# Patient Record
Sex: Male | Born: 1965 | Race: Black or African American | Hispanic: No | Marital: Single | Smoking: Former smoker
Health system: Southern US, Community
[De-identification: ages and names within clinical notes are randomized; demographics above are authoritative.]

## PROBLEM LIST (undated history)

## (undated) DIAGNOSIS — I5042 Chronic combined systolic (congestive) and diastolic (congestive) heart failure: Secondary | ICD-10-CM

## (undated) DIAGNOSIS — I2510 Atherosclerotic heart disease of native coronary artery without angina pectoris: Secondary | ICD-10-CM

## (undated) DIAGNOSIS — I10 Essential (primary) hypertension: Secondary | ICD-10-CM

## (undated) DIAGNOSIS — I4891 Unspecified atrial fibrillation: Secondary | ICD-10-CM

## (undated) DIAGNOSIS — N183 Chronic kidney disease, stage 3 unspecified: Secondary | ICD-10-CM

## (undated) DIAGNOSIS — I255 Ischemic cardiomyopathy: Secondary | ICD-10-CM

## (undated) DIAGNOSIS — I219 Acute myocardial infarction, unspecified: Secondary | ICD-10-CM

## (undated) DIAGNOSIS — Z720 Tobacco use: Secondary | ICD-10-CM

## (undated) DIAGNOSIS — R0600 Dyspnea, unspecified: Secondary | ICD-10-CM

## (undated) HISTORY — DX: Chronic kidney disease, stage 3 (moderate): N18.3

## (undated) HISTORY — DX: Tobacco use: Z72.0

## (undated) HISTORY — DX: Atherosclerotic heart disease of native coronary artery without angina pectoris: I25.10

## (undated) HISTORY — DX: Chronic kidney disease, stage 3 unspecified: N18.30

## (undated) HISTORY — DX: Chronic combined systolic (congestive) and diastolic (congestive) heart failure: I50.42

## (undated) HISTORY — DX: Ischemic cardiomyopathy: I25.5

## (undated) HISTORY — PX: OTHER SURGICAL HISTORY: SHX169

## (undated) HISTORY — PX: CARDIAC CATHETERIZATION: SHX172

---

## 2012-02-04 ENCOUNTER — Ambulatory Visit (INDEPENDENT_AMBULATORY_CARE_PROVIDER_SITE_OTHER): Payer: 59 | Admitting: Emergency Medicine

## 2012-02-04 VITALS — BP 158/103 | HR 98 | Temp 102.2°F | Resp 17 | Ht 69.0 in | Wt 212.0 lb

## 2012-02-04 DIAGNOSIS — R05 Cough: Secondary | ICD-10-CM

## 2012-02-04 DIAGNOSIS — J111 Influenza due to unidentified influenza virus with other respiratory manifestations: Secondary | ICD-10-CM

## 2012-02-04 MED ORDER — HYDROCOD POLST-CHLORPHEN POLST 10-8 MG/5ML PO LQCR: 5.0000 mL | Freq: Two times a day (BID) | ORAL | Status: DC | PRN

## 2012-02-04 MED ORDER — OSELTAMIVIR PHOSPHATE 75 MG PO CAPS: 75.0000 mg | ORAL_CAPSULE | Freq: Two times a day (BID) | ORAL | Status: DC

## 2012-02-04 NOTE — Patient Instructions (Signed)

## 2012-02-04 NOTE — Progress Notes (Signed)
[facility] [address] [place] [address] [phone]  Date:  02/04/2012   Name:  Seth Keller   DOB:  [birth year]   MRN:  [id number]  PCP:  No primary provider on file.    Chief Complaint: Fever, Cough and Abdominal Pain   History of Present Illness:  Seth Keller is a 46 y.o. very pleasant male patient who presents with the following:  Ill since Friday with cough productive scant purulent sputum.  Has nasal congestion and minimal mucoid discharge.  Has a fever, chills, myalgias. Had a temp up to 102.  Cough with paroxysms resulting in vomiting.  No wheezing but some exertional shortness of breath due coughing.  No improvement with OTC medications.  There is no problem list on file for this patient.   History reviewed. No pertinent past medical history.  History reviewed. No pertinent past surgical history.  History  Substance Use Topics  . Smoking status: Current Every Day Smoker -- 1.0 packs/day for 20 years    Types: Cigarettes  . Smokeless tobacco: Not on file  . Alcohol Use: No    History reviewed. No pertinent family history.  No Known Allergies  Medication list has been reviewed and updated.  No current outpatient prescriptions on file prior to visit.    Review of Systems:  As per HPI, otherwise negative.    Physical Examination: Filed Vitals:   02/04/12 1151  BP: 158/103  Pulse: 98  Temp: 102.2 F (39 C)  Resp: 17   Filed Vitals:   02/04/12 1151  Height: 5\' 9"  (1.753 m)  Weight: 212 lb (96.163 kg)   Body mass index is 31.31 kg/(m^2). Ideal Body Weight: Weight in (lb) to have BMI = 25: 168.9   GEN: WDWN, moderate distress`, Non-toxic, A & O x 3 HEENT: Atraumatic, Normocephalic. Neck supple. No masses, No LAD.  Oropharynx negative Ears and Nose: No external deformity.  TM negative CV: RRR, No M/G/R. No JVD. No thrill. No extra heart sounds. PULM: CTA B, no wheezes, crackles, rhonchi. No retractions. No resp. distress. No  accessory muscle use. ABD: S, NT, ND, +BS. No rebound. No HSM. EXTR: No c/c/e NEURO Normal gait.  PSYCH: Normally interactive. Conversant. Not depressed or anxious appearing.  Calm demeanor.    Assessment and Plan: Influenza tussionex tamiflu   Roselee Culver, MD  Results for orders placed in visit on 02/04/12  POCT INFLUENZA A/B      Component Value Range   Influenza A, POC Positive     Influenza B, POC Negative

## 2012-02-05 NOTE — Progress Notes (Signed)
Reviewed and agree.

## 2012-05-08 ENCOUNTER — Other Ambulatory Visit: Payer: Self-pay | Admitting: Family Medicine

## 2012-05-08 ENCOUNTER — Ambulatory Visit (INDEPENDENT_AMBULATORY_CARE_PROVIDER_SITE_OTHER): Payer: BC Managed Care – PPO | Admitting: Family Medicine

## 2012-05-08 ENCOUNTER — Encounter: Payer: Self-pay | Admitting: Family Medicine

## 2012-05-08 VITALS — BP 164/108 | HR 69 | Temp 98.1°F | Resp 18 | Ht 69.0 in | Wt 209.2 lb

## 2012-05-08 DIAGNOSIS — R3129 Other microscopic hematuria: Secondary | ICD-10-CM

## 2012-05-08 DIAGNOSIS — R809 Proteinuria, unspecified: Secondary | ICD-10-CM

## 2012-05-08 DIAGNOSIS — I10 Essential (primary) hypertension: Secondary | ICD-10-CM

## 2012-05-08 LAB — BASIC METABOLIC PANEL
BUN: 13 mg/dL (ref 6–23)
Calcium: 9.2 mg/dL (ref 8.4–10.5)
Creat: 1.21 mg/dL (ref 0.50–1.35)
Glucose, Bld: 83 mg/dL (ref 70–99)
Sodium: 141 mEq/L (ref 135–145)

## 2012-05-08 MED ORDER — LISINOPRIL-HYDROCHLOROTHIAZIDE 10-12.5 MG PO TABS: 1.0000 | ORAL_TABLET | Freq: Every day | ORAL | Status: DC

## 2012-05-08 NOTE — Patient Instructions (Signed)
Start taking one lisinopril/ hctz pill a day.  I will be in touch with your labs and to make a follow- up plan.

## 2012-05-08 NOTE — Progress Notes (Addendum)
[facility] [address], [place] [address] [phone]  Date:  05/08/2012   Name:  Seth Keller   DOB:  Dec 13, 1965   MRN:  [id number]  PCP:  No primary provider on file.    Chief Complaint: Hypertension   History of Present Illness:  Seth Keller is a 47 y.o. very pleasant male patient who presents with the following:  Here today to be treated for HTN noted at his [facility] evaluation 2 days ago.  He has never been treated for HTN or diagnosed with HTN in the past.  He has been seen here once before- in January, at which time he had influenza and his BP was 158/103.  He has been seen here 2 days ago and today for a WC issue and been noted to have HTN with BP up to 210/120, and microhematuria on both occassions as well.  He also has proteinuria.  He does not noted any symptoms of his HTN  There is no problem list on file for this patient.   No past medical history on file.  No past surgical history on file.  History  Substance Use Topics  . Smoking status: Current Every Day Smoker -- 1.00 packs/day for 20 years    Types: Cigarettes  . Smokeless tobacco: Not on file  . Alcohol Use: No    No family history on file.  No Known Allergies  Medication list has been reviewed and updated.  Current Outpatient Prescriptions on File Prior to Visit  Medication Sig Dispense Refill  . chlorpheniramine-HYDROcodone (TUSSIONEX PENNKINETIC ER) 10-8 MG/5ML LQCR Take 5 mLs by mouth every 12 (twelve) hours as needed (cough).  60 mL  0  . oseltamivir (TAMIFLU) 75 MG capsule Take 1 capsule (75 mg total) by mouth 2 (two) times daily.  10 capsule  0   No current facility-administered medications on file prior to visit.    Review of Systems:  As per HPI- otherwise negative.   Physical Examination: Filed Vitals:   05/08/12 1206  BP: 164/108  Pulse: 69  Temp: 98.1 F (36.7 C)  Resp: 18   Filed Vitals:   05/08/12 1206  Height: 5\' 9"  (1.753 m)  Weight: 209 lb 3.2 oz (94.892  kg)   Body mass index is 30.88 kg/(m^2). Ideal Body Weight: Weight in (lb) to have BMI = 25: 168.9  GEN: WDWN, NAD, Non-toxic, A & O x 3, mild overweight HEENT: Atraumatic, Normocephalic. Neck supple. No masses, No LAD. Ears and Nose: No external deformity. CV: RRR, No M/G/R. No JVD. No thrill. No extra heart sounds. PULM: CTA B, no wheezes, crackles, rhonchi. No retractions. No resp. distress. No accessory muscle use. ABD: S, NT, ND, +BS. No rebound. No HSM.  He has a current back injury per [facility] which is unrelated EXTR: No c/c/e NEURO Normal gait.  PSYCH: Normally interactive. Conversant. Not depressed or anxious appearing.  Calm demeanor.    Assessment and Plan: Unspecified essential hypertension - Plan: Basic metabolic panel, lisinopril-hydrochlorothiazide (PRINZIDE,ZESTORETIC) 10-12.5 MG per tablet, CANCELED: Comprehensive metabolic panel  Microhematuria - Plan: Ambulatory referral to Urology  Referral to urology to evaluate his microhematuria, especially as he is a smoker.  Untreated HTN- plan to start lisinopril/ HCTZ.  However, he is also noted to have significant proteinuria today.  Will await his CMP which should be back by tomorrow prior to having him start the BP medication to ensure we do not need to choose another agent if he has  elevated BUN/ creat.  His proteinuria may be due to microhematuria, but I doubt he has enough blood to cause this level of proteinuria.  Will plan to follow- up proteinuria as well  Signed Lamar Blinks, MD  Called 05/09/12-  Received his BMP- looks ok Results for orders placed in visit on [id number]  BASIC METABOLIC PANEL      Result Value Range   Sodium 141  135 - 145 mEq/L   Potassium 4.5  3.5 - 5.3 mEq/L   Chloride 105  96 - 112 mEq/L   CO2 27  19 - 32 mEq/L   Glucose, Bld 83  70 - 99 mg/dL   BUN 13  6 - 23 mg/dL   Creat 1.21  0.50 - 1.35 mg/dL   Calcium 9.2  8.4 - 10.5 mg/dL   He may go ahead and start the lisinopril/ HCTZ.  Will send  a copy of his labs and see him for follow- up of his WC case next week- at that time will recheck his BP.  An ACE should help to treat his proteinuria.

## 2012-05-09 ENCOUNTER — Encounter: Payer: Self-pay | Admitting: Family Medicine

## 2012-05-29 ENCOUNTER — Ambulatory Visit (HOSPITAL_COMMUNITY): Admission: RE | Admit: 2012-05-29 | Payer: BC Managed Care – PPO | Source: Ambulatory Visit

## 2012-05-29 ENCOUNTER — Telehealth: Payer: Self-pay | Admitting: Radiology

## 2012-05-29 ENCOUNTER — Ambulatory Visit (INDEPENDENT_AMBULATORY_CARE_PROVIDER_SITE_OTHER): Payer: BC Managed Care – PPO | Admitting: Family Medicine

## 2012-05-29 ENCOUNTER — Encounter: Payer: Self-pay | Admitting: Family Medicine

## 2012-05-29 VITALS — BP 180/110 | HR 63 | Temp 98.2°F | Resp 16 | Ht 69.0 in | Wt 221.0 lb

## 2012-05-29 DIAGNOSIS — R202 Paresthesia of skin: Secondary | ICD-10-CM

## 2012-05-29 DIAGNOSIS — R209 Unspecified disturbances of skin sensation: Secondary | ICD-10-CM

## 2012-05-29 DIAGNOSIS — R319 Hematuria, unspecified: Secondary | ICD-10-CM

## 2012-05-29 DIAGNOSIS — E785 Hyperlipidemia, unspecified: Secondary | ICD-10-CM

## 2012-05-29 DIAGNOSIS — I10 Essential (primary) hypertension: Secondary | ICD-10-CM

## 2012-05-29 DIAGNOSIS — R809 Proteinuria, unspecified: Secondary | ICD-10-CM

## 2012-05-29 LAB — POCT UA - MICROSCOPIC ONLY: Yeast, UA: NEGATIVE

## 2012-05-29 LAB — POCT URINALYSIS DIPSTICK
Bilirubin, UA: NEGATIVE
Glucose, UA: NEGATIVE
Leukocytes, UA: NEGATIVE
Nitrite, UA: NEGATIVE

## 2012-05-29 LAB — POCT CBC
HCT, POC: 47.2 % (ref 43.5–53.7)
Lymph, poc: 2.5 (ref 0.6–3.4)
MCHC: 32.4 g/dL (ref 31.8–35.4)
MCV: 85 fL (ref 80–97)
POC LYMPH PERCENT: 29.5 %L (ref 10–50)
RDW, POC: 14.4 %

## 2012-05-29 LAB — CREATININE WITH EST GFR: GFR, Est Non African American: 71 mL/min

## 2012-05-29 LAB — COMPREHENSIVE METABOLIC PANEL
AST: 15 U/L (ref 0–37)
Albumin: 4.1 g/dL (ref 3.5–5.2)
BUN: 18 mg/dL (ref 6–23)
Calcium: 9.5 mg/dL (ref 8.4–10.5)
Chloride: 103 mEq/L (ref 96–112)
Glucose, Bld: 83 mg/dL (ref 70–99)
Potassium: 3.9 mEq/L (ref 3.5–5.3)
Sodium: 138 mEq/L (ref 135–145)
Total Protein: 7.2 g/dL (ref 6.0–8.3)

## 2012-05-29 MED ORDER — AMLODIPINE BESYLATE 10 MG PO TABS: 10.0000 mg | ORAL_TABLET | Freq: Every day | ORAL | Status: DC

## 2012-05-29 NOTE — Telephone Encounter (Signed)
Patient states he can not afford the MRA scan (his part $200) so he left without the study. Patient advised if he gets worse to go to ER, he agrees. He would like referral to see a specialist instead.

## 2012-05-29 NOTE — Progress Notes (Addendum)
[facility] [address], [place] [address] [phone]  Date:  05/29/2012   Name:  Seth Keller   DOB:  February 23, 1965   MRN:  [id number]  PCP:  No primary provider on file.    Chief Complaint: Hypertension   History of Present Illness:  Seth Keller is a 47 y.o. very pleasant male patient who presents with the following:  Here to recheck his BP. We have been following his BP for about 3 weeks now. He has started on lisinopril/ hctz and is now up to taking lisinopril/ HCTZ 10/ 12.5, 2 tablets a day.  He is also taking flexeril for an unrelated WC injury.  He has been taking the increased dose of his BP medication for a couple of weeks and doing well except he does urinate more.  He does not check his BP at home  He had 2 episodes of numbness in his left leg- once a week ago and once yesterday. Each episode lasted about 15 mintues.  He attributed this to his back injury.  However, along with the leg numbness yesterday he also noted numbness and ?weakness in his left arm which lasted for approximelty 2 minutes. He did not have any slurred speech.   No headache.   No syncope No chest pain.    I had send him a letter last month regarding his normal BUN/ creatinine.  He took this to mean he did not need to see urology and did not attend his appt.  Explained that I still want him to see urology due to smoking history and microhematuria.  Called [facility] and got a new appt as below  Patient Active Problem List  Diagnosis  . Unspecified essential hypertension  . Microhematuria  . Proteinuria    No past medical history on file.  No past surgical history on file.  History  Substance Use Topics  . Smoking status: Current Every Day Smoker -- 1.00 packs/day for 20 years    Types: Cigarettes  . Smokeless tobacco: Not on file  . Alcohol Use: No    No family history on file.  No Known Allergies  Medication list has been reviewed and updated.  Current Outpatient  Prescriptions on File Prior to Visit  Medication Sig Dispense Refill  . lisinopril-hydrochlorothiazide (PRINZIDE,ZESTORETIC) 10-12.5 MG per tablet Take 1 tablet by mouth daily.  30 tablet  6   No current facility-administered medications on file prior to visit.    Review of Systems:  As per HPI- otherwise negative.   Physical Examination: Filed Vitals:   05/29/12 1021  BP: 190/118  Pulse: 63  Temp: 98.2 F (36.8 C)  Resp: 16   Filed Vitals:   05/29/12 1021  Height: 5\' 9"  (1.753 m)  Weight: 221 lb (100.245 kg)   Body mass index is 32.62 kg/(m^2). Ideal Body Weight: Weight in (lb) to have BMI = 25: 168.9  GEN: WDWN, NAD, Non-toxic, A & O x 3 HEENT: Atraumatic, Normocephalic. Neck supple. No masses, No LAD.  Bilateral TM wnl, oropharynx normal.  PEERL,EOMI.   Ears and Nose: No external deformity. CV: RRR, No M/G/R. No JVD. No thrill. No extra heart sounds. PULM: CTA B, no wheezes, crackles, rhonchi. No retractions. No resp. distress. No accessory muscle use. ABD: S, NT, ND, +BS. No rebound. No HSM. EXTR: No c/c/e NEURO Normal gait. Normal strength, sensation and DTR all extremities, normal romberg and normal gait  PSYCH: Normally interactive. Conversant. Not depressed or anxious appearing.  Calm demeanor.   EKG: no old EKG available for comparison.   SR with small Q waves in II, AVl, and flipped T waves aVL, V4, V5.  Discussed with Dr. Acie Fredrickson who did not see anything of acute concern, will get Echo and refer to cardiology for follow- up.    Discussed with MD at [facility] neuro- possible TIA yesterday,  Will set up for MRA of head and neck, and an echocardiogram, start aspirin, continue to work on lowering his BP and plan follow- up with neurology  Results for orders placed in visit on 05/29/12  POCT UA - MICROSCOPIC ONLY      Result Value Range   WBC, Ur, HPF, POC 0-1     RBC, urine, microscopic 0-5     Bacteria, U Microscopic neg     Mucus, UA neg     Epithelial  cells, urine per micros neg     Crystals, Ur, HPF, POC neg     Casts, Ur, LPF, POC neg     Yeast, UA neg    POCT URINALYSIS DIPSTICK      Result Value Range   Color, UA yellow     Clarity, UA clear     Glucose, UA neg     Bilirubin, UA neg     Ketones, UA neg     Spec Grav, UA >=1.030     Blood, UA trace-intact     pH, UA 6.0     Protein, UA >300     Urobilinogen, UA 1.0     Nitrite, UA neg     Leukocytes, UA Negative    POCT CBC      Result Value Range   WBC 8.5  4.6 - 10.2 K/uL   Lymph, poc 2.5  0.6 - 3.4   POC LYMPH PERCENT 29.5  10 - 50 %L   MID (cbc) 0.7  0 - 0.9   POC MID % 8.1  0 - 12 %M   POC Granulocyte 5.3  2 - 6.9   Granulocyte percent 62.4  37 - 80 %G   RBC 5.55  4.69 - 6.13 M/uL   Hemoglobin 15.3  14.1 - 18.1 g/dL   HCT, POC 47.2  43.5 - 53.7 %   MCV 85.0  80 - 97 fL   MCH, POC 27.6  27 - 31.2 pg   MCHC 32.4  31.8 - 35.4 g/dL   RDW, POC 14.4     Platelet Count, POC 311  142 - 424 K/uL   MPV 8.9  0 - 99.8 fL    Assessment and Plan: HTN (hypertension) - Plan: POCT CBC, Comprehensive metabolic panel, EKG [id number], amLODipine (NORVASC) 10 MG tablet, Ambulatory referral to Neurology, LDL cholesterol, direct  Numbness and tingling in left arm - Plan: EKG 12-Lead, MR MRA Head/Brain Wo Cm, MR Angiogram Neck W Wo Contrast, 2D Echocardiogram without contrast  Proteinuria - Plan: POCT UA - Microscopic Only, POCT urinalysis dipstick  Hematuria - Plan: POCT UA - Microscopic Only, POCT urinalysis dipstick   Possible TIA yesterday.  Risk factors include uncontrolled BP and tobacco use. He also has accelerated HTN which has not responded to treatment as of yet.  Advised him to go to the ED for evaluation and treatment of his possible TIA and accelerated HTN.  Discussed the risk of a stroke or other complication and the benefit of more aggressive BP lowering such as can be achieved in the hospital setting.  However, he declined ED evaluation  today.  Prefers outpatient  management.   Add amlodipine for BP control.  Stop smoking.   Referred for an urgent MRA of his head and neck.  Will also refer for cardiac echo and follow- up with cardiology with results. Planned to ensure no bleed on his MRA and then proced with aspirin therapy.    However, Susano later called back and stated that he could not afford the co-pay for his MRA and that he declined to have this done today. Discussed the possibility of a recurrent stroke, and the fact that we cannot tell an ischemic event from a hemorrhagic event without the MRA.  Encouraged him again to go to the ED for evaluation- he will not have to pay anything up front.  However, he declined ED evaluation again.  He plans to start the amlodipine, chooses to start asa 81 mg (advised that if he did have a hemorrhagic event this could worsen his situation) and will see me on Monday for a BP check.    Was able to make a new appt with urology on 06/19/12 at 10:30, with Dr. Tresa Moore- gave him this information. He knows to go straight to the ED if any neurological symptoms return.  Signed Lamar Blinks, MD  Called [facility] to check on him and discuss his labs: left a detailed message; please call if any concerns or problems. His LDL is about 200, our goal is 100.  I am going to call in some generic lipitor to his drug store, if he is ok with that please pick up and start taking asap.  Please come and see me next week- preferably Monday- to check on his BP progress.   Results for orders placed in visit on 05/29/12  COMPREHENSIVE METABOLIC PANEL      Result Value Range   Sodium 138  135 - 145 mEq/L   Potassium 3.9  3.5 - 5.3 mEq/L   Chloride 103  96 - 112 mEq/L   CO2 25  19 - 32 mEq/L   Glucose, Bld 83  70 - 99 mg/dL   BUN 18  6 - 23 mg/dL   Creat 1.36 (*) 0.50 - 1.35 mg/dL   Total Bilirubin 0.5  0.3 - 1.2 mg/dL   Alkaline Phosphatase 76  39 - 117 U/L   AST 15  0 - 37 U/L   ALT 17  0 - 53 U/L   Total Protein 7.2  6.0 - 8.3 g/dL    Albumin 4.1  3.5 - 5.2 g/dL   Calcium 9.5  8.4 - 10.5 mg/dL  LDL CHOLESTEROL, DIRECT      Result Value Range   Direct LDL 198 (*)   POCT UA - MICROSCOPIC ONLY      Result Value Range   WBC, Ur, HPF, POC 0-1     RBC, urine, microscopic 0-5     Bacteria, U Microscopic neg     Mucus, UA neg     Epithelial cells, urine per micros neg     Crystals, Ur, HPF, POC neg     Casts, Ur, LPF, POC neg     Yeast, UA neg    POCT URINALYSIS DIPSTICK      Result Value Range   Color, UA yellow     Clarity, UA clear     Glucose, UA neg     Bilirubin, UA neg     Ketones, UA neg     Spec Grav, UA >=1.030     Blood, UA  trace-intact     pH, UA 6.0     Protein, UA >300     Urobilinogen, UA 1.0     Nitrite, UA neg     Leukocytes, UA Negative    POCT CBC      Result Value Range   WBC 8.5  4.6 - 10.2 K/uL   Lymph, poc 2.5  0.6 - 3.4   POC LYMPH PERCENT 29.5  10 - 50 %L   MID (cbc) 0.7  0 - 0.9   POC MID % 8.1  0 - 12 %M   POC Granulocyte 5.3  2 - 6.9   Granulocyte percent 62.4  37 - 80 %G   RBC 5.55  4.69 - 6.13 M/uL   Hemoglobin 15.3  14.1 - 18.1 g/dL   HCT, POC 47.2  43.5 - 53.7 %   MCV 85.0  80 - 97 fL   MCH, POC 27.6  27 - 31.2 pg   MCHC 32.4  31.8 - 35.4 g/dL   RDW, POC 14.4     Platelet Count, POC 311  142 - 424 K/uL   MPV 8.9  0 - 99.8 fL

## 2012-05-29 NOTE — Patient Instructions (Addendum)
Start taking aspirin 325 mg a day once you hear from me regarding your MRI scan results.   We are going to refer you for an MRA of your head and neck today If you experience possible stroke or mini- stroke symptoms again go directly to the ER or call 911.

## 2012-05-30 ENCOUNTER — Telehealth: Payer: Self-pay

## 2012-05-30 NOTE — Telephone Encounter (Signed)
Radiology dept from [facility] called to report that pt was supposed to have MRAs done last night and he was a no-show. Dr Lorelei Pont, Juluis Rainier

## 2012-05-31 ENCOUNTER — Encounter: Payer: Self-pay | Admitting: Family Medicine

## 2012-05-31 MED ORDER — ATORVASTATIN CALCIUM 40 MG PO TABS: 40.0000 mg | ORAL_TABLET | Freq: Every day | ORAL | Status: DC

## 2012-05-31 NOTE — Addendum Note (Signed)
Addended by: Lamar Blinks C on: 05/31/2012 10:04 AM   Modules accepted: Orders

## 2012-06-12 ENCOUNTER — Other Ambulatory Visit (HOSPITAL_COMMUNITY): Payer: BC Managed Care – PPO

## 2012-06-13 ENCOUNTER — Ambulatory Visit: Payer: BC Managed Care – PPO | Admitting: Diagnostic Neuroimaging

## 2012-07-03 ENCOUNTER — Other Ambulatory Visit: Payer: Self-pay

## 2012-07-03 DIAGNOSIS — I10 Essential (primary) hypertension: Secondary | ICD-10-CM

## 2012-07-03 DIAGNOSIS — E785 Hyperlipidemia, unspecified: Secondary | ICD-10-CM

## 2012-07-03 MED ORDER — ATORVASTATIN CALCIUM 40 MG PO TABS: 40.0000 mg | ORAL_TABLET | Freq: Every day | ORAL | Status: DC

## 2012-07-03 MED ORDER — LISINOPRIL-HYDROCHLOROTHIAZIDE 10-12.5 MG PO TABS: 1.0000 | ORAL_TABLET | Freq: Every day | ORAL | Status: DC

## 2012-07-14 ENCOUNTER — Other Ambulatory Visit: Payer: Self-pay

## 2012-07-14 DIAGNOSIS — I10 Essential (primary) hypertension: Secondary | ICD-10-CM

## 2012-07-14 MED ORDER — LISINOPRIL-HYDROCHLOROTHIAZIDE 10-12.5 MG PO TABS: 1.0000 | ORAL_TABLET | Freq: Every day | ORAL | Status: DC

## 2013-08-31 ENCOUNTER — Telehealth: Payer: Self-pay | Admitting: *Deleted

## 2013-08-31 NOTE — Telephone Encounter (Signed)
Faxed signed order to [facility], per Dr Lenna Sciara Copland. Confirmation page received 8:36 am.

## 2013-12-26 ENCOUNTER — Encounter (HOSPITAL_COMMUNITY): Payer: Self-pay | Admitting: Emergency Medicine

## 2013-12-26 ENCOUNTER — Emergency Department (HOSPITAL_COMMUNITY): Payer: Medicaid Other

## 2013-12-26 ENCOUNTER — Ambulatory Visit (INDEPENDENT_AMBULATORY_CARE_PROVIDER_SITE_OTHER): Payer: Self-pay | Admitting: Family Medicine

## 2013-12-26 ENCOUNTER — Inpatient Hospital Stay (HOSPITAL_COMMUNITY)
Admission: EM | Admit: 2013-12-26 | Discharge: 2014-01-07 | DRG: 233 | Disposition: A | Discharge status: Home / Self Care | Payer: Medicaid Other | Attending: Surgery | Admitting: Surgery

## 2013-12-26 VITALS — BP 218/142 | HR 118 | Temp 99.0°F | Resp 20 | Ht 69.0 in | Wt 228.0 lb

## 2013-12-26 DIAGNOSIS — I059 Rheumatic mitral valve disease, unspecified: Secondary | ICD-10-CM

## 2013-12-26 DIAGNOSIS — Z131 Encounter for screening for diabetes mellitus: Secondary | ICD-10-CM

## 2013-12-26 DIAGNOSIS — I129 Hypertensive chronic kidney disease with stage 1 through stage 4 chronic kidney disease, or unspecified chronic kidney disease: Secondary | ICD-10-CM | POA: Diagnosis present

## 2013-12-26 DIAGNOSIS — E8779 Other fluid overload: Secondary | ICD-10-CM | POA: Diagnosis not present

## 2013-12-26 DIAGNOSIS — I214 Non-ST elevation (NSTEMI) myocardial infarction: Secondary | ICD-10-CM | POA: Diagnosis not present

## 2013-12-26 DIAGNOSIS — N179 Acute kidney failure, unspecified: Secondary | ICD-10-CM

## 2013-12-26 DIAGNOSIS — I25118 Atherosclerotic heart disease of native coronary artery with other forms of angina pectoris: Secondary | ICD-10-CM | POA: Diagnosis present

## 2013-12-26 DIAGNOSIS — R0602 Shortness of breath: Secondary | ICD-10-CM

## 2013-12-26 DIAGNOSIS — E785 Hyperlipidemia, unspecified: Secondary | ICD-10-CM

## 2013-12-26 DIAGNOSIS — Z87891 Personal history of nicotine dependence: Secondary | ICD-10-CM

## 2013-12-26 DIAGNOSIS — Z91013 Allergy to seafood: Secondary | ICD-10-CM

## 2013-12-26 DIAGNOSIS — N183 Chronic kidney disease, stage 3 unspecified: Secondary | ICD-10-CM | POA: Diagnosis present

## 2013-12-26 DIAGNOSIS — R3129 Other microscopic hematuria: Secondary | ICD-10-CM

## 2013-12-26 DIAGNOSIS — R079 Chest pain, unspecified: Secondary | ICD-10-CM

## 2013-12-26 DIAGNOSIS — R9431 Abnormal electrocardiogram [ECG] [EKG]: Secondary | ICD-10-CM

## 2013-12-26 DIAGNOSIS — Z7982 Long term (current) use of aspirin: Secondary | ICD-10-CM | POA: Diagnosis not present

## 2013-12-26 DIAGNOSIS — I4891 Unspecified atrial fibrillation: Secondary | ICD-10-CM | POA: Diagnosis not present

## 2013-12-26 DIAGNOSIS — Z9119 Patient's noncompliance with other medical treatment and regimen: Secondary | ICD-10-CM | POA: Diagnosis present

## 2013-12-26 DIAGNOSIS — I255 Ischemic cardiomyopathy: Secondary | ICD-10-CM | POA: Diagnosis present

## 2013-12-26 DIAGNOSIS — Z951 Presence of aortocoronary bypass graft: Secondary | ICD-10-CM

## 2013-12-26 DIAGNOSIS — D62 Acute posthemorrhagic anemia: Secondary | ICD-10-CM | POA: Diagnosis not present

## 2013-12-26 DIAGNOSIS — I5021 Acute systolic (congestive) heart failure: Secondary | ICD-10-CM | POA: Diagnosis not present

## 2013-12-26 DIAGNOSIS — I160 Hypertensive urgency: Secondary | ICD-10-CM

## 2013-12-26 DIAGNOSIS — J9811 Atelectasis: Secondary | ICD-10-CM

## 2013-12-26 DIAGNOSIS — I10 Essential (primary) hypertension: Secondary | ICD-10-CM

## 2013-12-26 DIAGNOSIS — N189 Chronic kidney disease, unspecified: Secondary | ICD-10-CM

## 2013-12-26 DIAGNOSIS — R778 Other specified abnormalities of plasma proteins: Secondary | ICD-10-CM | POA: Diagnosis present

## 2013-12-26 DIAGNOSIS — Z79899 Other long term (current) drug therapy: Secondary | ICD-10-CM | POA: Diagnosis not present

## 2013-12-26 DIAGNOSIS — R312 Other microscopic hematuria: Secondary | ICD-10-CM

## 2013-12-26 DIAGNOSIS — I2510 Atherosclerotic heart disease of native coronary artery without angina pectoris: Secondary | ICD-10-CM

## 2013-12-26 DIAGNOSIS — Z01818 Encounter for other preprocedural examination: Secondary | ICD-10-CM

## 2013-12-26 DIAGNOSIS — R7989 Other specified abnormal findings of blood chemistry: Secondary | ICD-10-CM

## 2013-12-26 HISTORY — DX: Essential (primary) hypertension: I10

## 2013-12-26 HISTORY — DX: Unspecified atrial fibrillation: I48.91

## 2013-12-26 LAB — CBC WITH DIFFERENTIAL/PLATELET
Basophils Absolute: 0 10*3/uL (ref 0.0–0.1)
Basophils Relative: 0 % (ref 0–1)
Eosinophils Absolute: 0.3 10*3/uL (ref 0.0–0.7)
Eosinophils Relative: 3 % (ref 0–5)
HEMATOCRIT: 40.5 % (ref 39.0–52.0)
HEMOGLOBIN: 13.6 g/dL (ref 13.0–17.0)
Lymphocytes Relative: 20 % (ref 12–46)
Lymphs Abs: 2 10*3/uL (ref 0.7–4.0)
MCH: 27.4 pg (ref 26.0–34.0)
MCHC: 33.6 g/dL (ref 30.0–36.0)
MCV: 81.5 fL (ref 78.0–100.0)
MONO ABS: 0.7 10*3/uL (ref 0.1–1.0)
MONOS PCT: 7 % (ref 3–12)
NEUTROS ABS: 7.1 10*3/uL (ref 1.7–7.7)
Neutrophils Relative %: 70 % (ref 43–77)
Platelets: 259 10*3/uL (ref 150–400)
RBC: 4.97 MIL/uL (ref 4.22–5.81)
RDW: 13.6 % (ref 11.5–15.5)
WBC: 10 10*3/uL (ref 4.0–10.5)

## 2013-12-26 LAB — POCT URINALYSIS DIPSTICK
Bilirubin, UA: NEGATIVE
Glucose, UA: NEGATIVE
KETONES UA: NEGATIVE
LEUKOCYTES UA: NEGATIVE
Nitrite, UA: NEGATIVE
PH UA: 6.5
Spec Grav, UA: 1.025
Urobilinogen, UA: 0.2

## 2013-12-26 LAB — POCT CBC
GRANULOCYTE PERCENT: 71.6 % (ref 37–80)
HCT, POC: 44.2 % (ref 43.5–53.7)
Hemoglobin: 14.2 g/dL (ref 14.1–18.1)
Lymph, poc: 2.4 (ref 0.6–3.4)
MCH, POC: 27.2 pg (ref 27–31.2)
MCHC: 32.1 g/dL (ref 31.8–35.4)
MCV: 84.8 fL (ref 80–97)
MID (CBC): 0.8 (ref 0–0.9)
MPV: 7.4 fL (ref 0–99.8)
PLATELET COUNT, POC: 267 10*3/uL (ref 142–424)
POC Granulocyte: 7.9 — AB (ref 2–6.9)
POC LYMPH PERCENT: 21.5 %L (ref 10–50)
POC MID %: 6.9 % (ref 0–12)
RBC: 5.22 M/uL (ref 4.69–6.13)
RDW, POC: 14.2 %
WBC: 11 10*3/uL — AB (ref 4.6–10.2)

## 2013-12-26 LAB — BASIC METABOLIC PANEL
ANION GAP: 15 (ref 5–15)
BUN: 15 mg/dL (ref 6–23)
CO2: 23 meq/L (ref 19–32)
CREATININE: 1.63 mg/dL — AB (ref 0.50–1.35)
Calcium: 8.7 mg/dL (ref 8.4–10.5)
Chloride: 102 mEq/L (ref 96–112)
GFR calc Af Amer: 56 mL/min — ABNORMAL LOW (ref >90)
GFR calc non Af Amer: 48 mL/min — ABNORMAL LOW (ref >90)
Glucose, Bld: 102 mg/dL — ABNORMAL HIGH (ref 70–99)
POTASSIUM: 4 meq/L (ref 3.7–5.3)
Sodium: 140 mEq/L (ref 137–147)

## 2013-12-26 LAB — LIPID PANEL
CHOL/HDL RATIO: 8.1 ratio
CHOLESTEROL: 282 mg/dL — AB (ref 0–200)
HDL: 35 mg/dL — ABNORMAL LOW (ref >39)
LDL Cholesterol: 209 mg/dL — ABNORMAL HIGH (ref 0–99)
Triglycerides: 188 mg/dL — ABNORMAL HIGH (ref <150)
VLDL: 38 mg/dL (ref 0–40)

## 2013-12-26 LAB — COMPLETE METABOLIC PANEL WITH GFR
ALBUMIN: 3.5 g/dL (ref 3.5–5.2)
ALK PHOS: 69 U/L (ref 39–117)
ALT: 27 U/L (ref 0–53)
AST: 21 U/L (ref 0–37)
BUN: 16 mg/dL (ref 6–23)
CO2: 25 mEq/L (ref 19–32)
Calcium: 9 mg/dL (ref 8.4–10.5)
Chloride: 105 mEq/L (ref 96–112)
Creat: 1.73 mg/dL — ABNORMAL HIGH (ref 0.50–1.35)
GFR, Est African American: 53 mL/min — ABNORMAL LOW
GFR, Est Non African American: 46 mL/min — ABNORMAL LOW
Glucose, Bld: 108 mg/dL — ABNORMAL HIGH (ref 70–99)
POTASSIUM: 4 meq/L (ref 3.5–5.3)
SODIUM: 140 meq/L (ref 135–145)
TOTAL PROTEIN: 7 g/dL (ref 6.0–8.3)
Total Bilirubin: 0.4 mg/dL (ref 0.2–1.2)

## 2013-12-26 LAB — URINALYSIS, ROUTINE W REFLEX MICROSCOPIC
Bilirubin Urine: NEGATIVE
Glucose, UA: NEGATIVE mg/dL
KETONES UR: NEGATIVE mg/dL
LEUKOCYTES UA: NEGATIVE
NITRITE: NEGATIVE
PH: 6.5 (ref 5.0–8.0)
Specific Gravity, Urine: 1.019 (ref 1.005–1.030)
UROBILINOGEN UA: 0.2 mg/dL (ref 0.0–1.0)

## 2013-12-26 LAB — I-STAT TROPONIN, ED: Troponin i, poc: 2.98 ng/mL (ref 0.00–0.08)

## 2013-12-26 LAB — RAPID URINE DRUG SCREEN, HOSP PERFORMED
Amphetamines: NOT DETECTED
BARBITURATES: NOT DETECTED
BENZODIAZEPINES: NOT DETECTED
Cocaine: NOT DETECTED
Opiates: NOT DETECTED
Tetrahydrocannabinol: NOT DETECTED

## 2013-12-26 LAB — TROPONIN I
TROPONIN I: 1.83 ng/mL — AB (ref <0.30)
TROPONIN I: 1.95 ng/mL — AB (ref <0.30)
Troponin I: 1.18 ng/mL (ref <0.30)

## 2013-12-26 LAB — TSH
TSH: 1.075 u[IU]/mL (ref 0.350–4.500)
TSH: 1.07 u[IU]/mL (ref 0.350–4.500)

## 2013-12-26 LAB — MRSA PCR SCREENING: MRSA by PCR: NEGATIVE

## 2013-12-26 LAB — URINE MICROSCOPIC-ADD ON

## 2013-12-26 LAB — HEPARIN LEVEL (UNFRACTIONATED): Heparin Unfractionated: 0.31 IU/mL (ref 0.30–0.70)

## 2013-12-26 LAB — GLUCOSE, POCT (MANUAL RESULT ENTRY): POC GLUCOSE: 96 mg/dL (ref 70–99)

## 2013-12-26 LAB — ETHANOL

## 2013-12-26 LAB — PRO B NATRIURETIC PEPTIDE: Pro B Natriuretic peptide (BNP): 6298 pg/mL — ABNORMAL HIGH (ref 0–125)

## 2013-12-26 MED ORDER — HEPARIN (PORCINE) IN NACL 100-0.45 UNIT/ML-% IJ SOLN
1700.0000 [IU]/h | INTRAMUSCULAR | Status: DC
  Administered 2013-12-26: 1300 [IU]/h via INTRAVENOUS
  Administered 2013-12-27 (×2): 1500 [IU]/h via INTRAVENOUS
  Administered 2013-12-28 – 2013-12-29 (×2): 1700 [IU]/h via INTRAVENOUS
  Filled 2013-12-26 (×6): qty 250

## 2013-12-26 MED ORDER — AMLODIPINE BESYLATE 5 MG PO TABS
5.0000 mg | ORAL_TABLET | Freq: Every day | ORAL | Status: DC
  Administered 2013-12-26 – 2013-12-27 (×2): 5 mg via ORAL
  Filled 2013-12-26 (×2): qty 1

## 2013-12-26 MED ORDER — LABETALOL HCL 200 MG PO TABS: 200.0000 mg | ORAL_TABLET | Freq: Two times a day (BID) | ORAL | Status: DC

## 2013-12-26 MED ORDER — ASPIRIN 81 MG PO CHEW
324.0000 mg | CHEWABLE_TABLET | Freq: Once | ORAL | Status: AC
  Administered 2013-12-26: 324 mg via ORAL
  Filled 2013-12-26: qty 4

## 2013-12-26 MED ORDER — SODIUM CHLORIDE 0.45 % IV SOLN
INTRAVENOUS | Status: DC
  Administered 2013-12-26: 16:00:00 via INTRAVENOUS

## 2013-12-26 MED ORDER — LABETALOL HCL 200 MG PO TABS
200.0000 mg | ORAL_TABLET | Freq: Two times a day (BID) | ORAL | Status: DC
  Administered 2013-12-26 (×2): 200 mg via ORAL
  Filled 2013-12-26 (×2): qty 1

## 2013-12-26 MED ORDER — LABETALOL HCL 5 MG/ML IV SOLN: 20.0000 mg | Freq: Once | INTRAVENOUS | Status: DC

## 2013-12-26 MED ORDER — ZOLPIDEM TARTRATE 5 MG PO TABS: 5.0000 mg | ORAL_TABLET | Freq: Every evening | ORAL | Status: DC | PRN

## 2013-12-26 MED ORDER — ACETAMINOPHEN 325 MG PO TABS: 650.0000 mg | ORAL_TABLET | ORAL | Status: DC | PRN

## 2013-12-26 MED ORDER — ALPRAZOLAM 0.25 MG PO TABS: 0.2500 mg | ORAL_TABLET | Freq: Two times a day (BID) | ORAL | Status: DC | PRN

## 2013-12-26 MED ORDER — ONDANSETRON HCL 4 MG/2ML IJ SOLN: 4.0000 mg | Freq: Four times a day (QID) | INTRAMUSCULAR | Status: DC | PRN

## 2013-12-26 MED ORDER — NITROGLYCERIN 0.4 MG SL SUBL
0.4000 mg | SUBLINGUAL_TABLET | SUBLINGUAL | Status: DC | PRN
  Administered 2013-12-27: 0.4 mg via SUBLINGUAL
  Filled 2013-12-26: qty 1

## 2013-12-26 MED ORDER — LABETALOL HCL 5 MG/ML IV SOLN
10.0000 mg | INTRAVENOUS | Status: DC | PRN
  Administered 2013-12-29 – 2013-12-30 (×2): 10 mg via INTRAVENOUS
  Filled 2013-12-26 (×2): qty 4

## 2013-12-26 MED ORDER — HEPARIN BOLUS VIA INFUSION
4000.0000 [IU] | Freq: Once | INTRAVENOUS | Status: AC
  Administered 2013-12-26: 4000 [IU] via INTRAVENOUS
  Filled 2013-12-26: qty 4000

## 2013-12-26 MED ORDER — ASPIRIN EC 81 MG PO TBEC
81.0000 mg | DELAYED_RELEASE_TABLET | Freq: Every day | ORAL | Status: DC
  Administered 2013-12-27 – 2013-12-31 (×4): 81 mg via ORAL
  Filled 2013-12-26 (×4): qty 1

## 2013-12-26 MED ORDER — LABETALOL HCL 5 MG/ML IV SOLN
2.0000 mg/min | INTRAVENOUS | Status: DC
  Administered 2013-12-26: 2 mg/min via INTRAVENOUS
  Filled 2013-12-26: qty 100

## 2013-12-26 NOTE — ED Provider Notes (Signed)
Medical screening examination/treatment/procedure(s) were conducted as a shared visit with non-physician practitioner(s) and myself.  I personally evaluated the patient during the encounter.   EKG Interpretation   Date/Time:  Saturday December 26 2013 10:05:54 EST Ventricular Rate:  100 PR Interval:  160 QRS Duration: 107 QT Interval:  354 QTC Calculation: 457 R Axis:   2 Text Interpretation:  Sinus tachycardia Probable anteroseptal infarct, old  Nonspecific T abnormalities, lateral leads Baseline wander in lead(s) V3  V4 No old tracing to compare Confirmed by WARD,  DO, KRISTEN ([id number]) on  12/26/2013 10:08:38 AM      Pt is a 48 y.o. M with h/o hypertension, hyperlipidemia, tobacco use who presents to the emergency department with chest pain or shortness of breath 2 days ago. Pain is described as a tightness without radiation that lasted for several minutes while at rest. He has not had any symptoms since. He is extreme hypertensive and tachycardic but has not been on his medications for several days. Labs show troponin of 3. EKG shows lateral T-wave inversions which are old compared to her prior EKG the patient arrives with. We'll give aspirin, medications to control his blood pressure. Concern for hypertensive emergency versus NSTEMI from ACS as he has risk factors. We'll discuss with cardiology and admit. We'll hold off on heparin until his blood pressure is better under control.  [place], DO 12/26/13 [phone]

## 2013-12-26 NOTE — Progress Notes (Signed)
EKG read and patient discussed and examined with Seth Keller. Agree with assessment and plan of care per her note. Agree with EMS transport and ER eval with chest pains, hypertensive urgency vs emergency with EKG change, but this may also be strain pattern. See details in Seth Keller's note.

## 2013-12-26 NOTE — Progress Notes (Signed)
  Echocardiogram 2D Echocardiogram has been performed.  [name] 12/26/2013, 2:51 PM

## 2013-12-26 NOTE — ED Notes (Signed)
SHOWN DR.WARD STAT TROPONIN LABS RESAULTS

## 2013-12-26 NOTE — Progress Notes (Signed)
ANTICOAGULATION CONSULT NOTE - Initial Consult  Pharmacy Consult for heparin Indication: chest pain/ACS  Allergies  Allergen Reactions  . Shellfish Allergy Anaphylaxis    Throat close up, swelling    Patient Measurements: Height: 5\' 9"  (175.3 cm) Weight: 228 lb (103.42 kg) IBW/kg (Calculated) : 70.7 Heparin Dosing Weight: 93 kg  Vital Signs: Temp: 98.1 F (36.7 C) (11/07 1010) Temp Source: Oral (11/07 1010) BP: 122/80 mmHg (11/07 1430) Pulse Rate: 76 (11/07 1430)  Labs:  Recent Labs  12/26/13 0906 12/26/13 1036 12/26/13 1038  HGB 14.2 13.6  --   HCT 44.2 40.5  --   PLT  --  259  --   CREATININE  --  1.63*  --   TROPONINI  --   --  1.83*    Estimated Creatinine Clearance: 65.7 mL/min (by C-G formula based on Cr of 1.63).   Medical History: Past Medical History  Diagnosis Date  . Hypertension approx. 2000    Medications:  Scheduled:  . amLODipine  5 mg Oral Daily  . [START ON 12/27/2013] aspirin EC  81 mg Oral Daily  . labetalol  200 mg Oral BID   Infusions:  . sodium chloride      Assessment: 48 yo male with chest pain will be started on heparin.  Patient didn't have any history of CAD.  Bseline Hgb 13.6 and Plt 259 K.  Goal of Therapy:  Heparin level 0.3-0.7 units/ml Monitor platelets by anticoagulation protocol: Yes   Plan:  - Heparin 4000 units iv bolus x1, then start heparin drip at 1300 units/hr - 6hr heparin level after drip is started - daily heparin level and CBC  Emmajean Ratledge, Tsz-Yin 12/26/2013,3:29 PM

## 2013-12-26 NOTE — ED Provider Notes (Signed)
CSN: [id number]     Arrival date & time 12/26/13  1000 History   First MD Initiated Contact with Patient 12/26/13 1001     Chief Complaint  Patient presents with  . Chest Pain  . Shortness of Breath     (Consider location/radiation/quality/duration/timing/severity/associated sxs/prior Treatment) HPI  Patient to the ER for evaluation of CP and SOB that started three days ago and resolved yesterday. He thought he had a cold and had been taking OTC medications for his symptoms but it did not help. He decided to go to the Urgent Care today to get things "checked out and cleared". He is a [employer] employee who was recently let go after an injury and he endorses being very stressed. He moved here from [place] 2 years ago and does not have a PCP. He is supposed to be on two blood pressure medications but has not been getting them the last 6 months. Prior to this he was getting refills at the urgent cares. He had been periodically checking his BP and it was mainly around 140/80's. He denies having headache, active chest pain, abdominal pain, hematuria, change in vision, dizziness, focal or generalized weakness, back pain, extremity pains. EKG on arrival shows changes to V4, V5, V6, this were also seen in 2014 on old EKG brought in by pt. Pt well appearing. BP is 218/142.   Pt quite smoking on 12/24/2013, just 2 days ago after 0.5 packs/day for 20 year history  Past Medical History  Diagnosis Date  . Hypertension    History reviewed. No pertinent past surgical history. No family history on file. History  Substance Use Topics  . Smoking status: Former Smoker -- 0.50 packs/day for 20 years    Types: Cigarettes    Quit date: 12/24/2013  . Smokeless tobacco: Not on file  . Alcohol Use: 0.0 oz/week    0 Not specified per week     Comment: socially    Review of Systems  10 Systems reviewed and are negative for acute change except as noted in the HPI.     Allergies  Shellfish  allergy  Home Medications   Prior to Admission medications   Medication Sig Start Date End Date Taking? Authorizing Provider  ibuprofen (ADVIL,MOTRIN) 200 MG tablet Take 400 mg by mouth 2 (two) times daily as needed for mild pain or moderate pain.   Yes Historical Provider, MD   BP 168/104 mmHg  Pulse 89  Temp(Src) 98.1 F (36.7 C) (Oral)  Resp 28  Ht 5\' 9"  (1.753 m)  Wt 228 lb (103.42 kg)  BMI 33.65 kg/m2  SpO2 98% Physical Exam  Constitutional: He appears well-developed and well-nourished. No distress.  HENT:  Head: Normocephalic and atraumatic.  Eyes: Conjunctivae are normal. Pupils are equal, round, and reactive to light.  Neck: Normal range of motion. Neck supple. No JVD present.  Cardiovascular: Regular rhythm.  Tachycardia present.   Pulmonary/Chest: Effort normal. No respiratory distress. He has no wheezes. He has no rales.  Abdominal: Soft. Bowel sounds are normal. He exhibits no distension, no fluid wave and no ascites. There is no tenderness.    Musculoskeletal:  No lower extremity swelling  Neurological: He is alert.  Cranial nerves II-VIII and X-XII evaluated and show no deficits. Pt alert and oriented x 3 Upper and lower extremity strength is symmetrical and physiologic Normal muscular tone No facial droop Coordination intact, no limb ataxia, finger-nose-finger normal Rapid alternating movements normal No pronator drift  Skin: Skin  is warm and dry.  Nursing note and vitals reviewed.   ED Course  Procedures (including critical care time) Labs Review Labs Reviewed  BASIC METABOLIC PANEL - Abnormal; Notable for the following:    Glucose, Bld 102 (*)    Creatinine, Ser 1.63 (*)    GFR calc non Af Amer 48 (*)    GFR calc Af Amer 56 (*)    All other components within normal limits  TROPONIN I - Abnormal; Notable for the following:    Troponin I 1.83 (*)    All other components within normal limits  I-STAT TROPOININ, ED - Abnormal; Notable for the  following:    Troponin i, poc 2.98 (*)    All other components within normal limits  CBC WITH DIFFERENTIAL  ETHANOL  URINALYSIS, ROUTINE W REFLEX MICROSCOPIC  URINE RAPID DRUG SCREEN (HOSP PERFORMED)    Imaging Review Dg Chest Port 1 View  12/26/2013   CLINICAL DATA:  47 year old male with hypertensive urgency  EXAM: PORTABLE CHEST - 1 VIEW  COMPARISON:  None.  FINDINGS: Cardiomegaly with left ventricular prominence likely exaggerated by portable frontal technique. Lungs are clear. No pulmonary edema. No acute osseous abnormality.  IMPRESSION: 1. Mild cardiomegaly with left ventricular prominence. 2. No acute cardiopulmonary process.   Electronically Signed   By: Jacqulynn Cadet M.D.   On: 12/26/2013 11:38     EKG Interpretation   Date/Time:  Saturday December 26 2013 10:05:54 EST Ventricular Rate:  100 PR Interval:  160 QRS Duration: 107 QT Interval:  354 QTC Calculation: 457 R Axis:   2 Text Interpretation:  Sinus tachycardia Probable anteroseptal infarct, old  Nonspecific T abnormalities, lateral leads Baseline wander in lead(s) V3  V4 No old tracing to compare Confirmed by WARD,  DO, KRISTEN ([id number]) on  12/26/2013 10:08:38 AM      MDM   Final diagnoses:  Chest pain  SOB (shortness of breath)  Hypertensive urgency    Medications  labetalol (NORMODYNE,TRANDATE) injection 20 mg (20 mg Intravenous Total Dose 12/26/13 1109)  labetalol (NORMODYNE,TRANDATE) 500 mg in dextrose 5 % 125 mL (4 mg/mL) infusion (2 mg/min Intravenous New Bag/Given 12/26/13 1108)  aspirin chewable tablet 324 mg (324 mg Oral Given 12/26/13 1109)    CRITICAL CARE Performed by: Linus Mako Total critical care time: 60 Critical care time was exclusive of separately billable procedures and treating other patients. Critical care was necessary to treat or prevent imminent or life-threatening deterioration. Critical care was time spent personally by me on the following activities: development of  treatment plan with patient and/or surrogate as well as nursing, discussions with consultants, evaluation of patient's response to treatment, examination of patient, obtaining history from patient or surrogate, ordering and performing treatments and interventions, ordering and review of laboratory studies, ordering and review of radiographic studies, pulse oximetry and re-evaluation of patient's condition.    11:59 am I spoke with cardiology who agrees to hold off on Heparin and continue to control pressures. Pressures are improving from medications. Filed Vitals:   12/26/13 1130  BP: 168/104  Pulse: 89  Temp:   Resp: 28   1:17 pm Cardiology is here to see. Pts BP has improved to 137/86.  Pt to be admitted and wiill get a 2D echo.  Linus Mako, PA-C 12/26/13 1318

## 2013-12-26 NOTE — Progress Notes (Signed)
   Subjective:    Patient ID: Seth Keller, male    DOB: October 03, 1965, 48 y.o.   MRN: [id number]  HPI Pt presents to clinic with 2 day h/o substernal chest pain that feels like a soft hammer has hit him and some SOB.  He thought he was getting sick because he stopped smoking 2 days ago but then it did not seem to get better and he was worried because of the his H/o HTN and then fact that he has not been on medication for over a year.  In 05/2013 he was evaluated here and then sent to Dr Einar Gip for a work-up but he has had no medical care since then.  He has felt fine until 2 days ago.  The chest pain is even at rest with some diaphoresis but no nausea and does sometimes get worse with activity.  He is currently no working and has had a lot of increased stress recently.  The pain does not radiate and has not really changed since its onset.   Review of Systems  Constitutional: Negative for fever and chills.  Respiratory: Positive for cough.   Cardiovascular: Positive for chest pain. Negative for leg swelling.  Gastrointestinal: Negative for nausea.       Objective:   Physical Exam  Constitutional: He is oriented to person, place, and time. He appears well-developed and well-nourished.  BP 218/142 mmHg  Pulse 118  Temp(Src) 99 F (37.2 C)  Resp 20  Ht 5\' 9"  (1.753 m)  Wt 228 lb (103.42 kg)  BMI 33.65 kg/m2  SpO2 96%   HENT:  Head: Normocephalic and atraumatic.  Right Ear: External ear normal.  Left Ear: External ear normal.  Cardiovascular: Regular rhythm and normal heart sounds.  Tachycardia present.   No murmur heard. Pulmonary/Chest: Effort normal and breath sounds normal.  Neurological: He is alert and oriented to person, place, and time.  Skin: Skin is warm and dry.  Psychiatric: He has a normal mood and affect. His behavior is normal. Judgment and thought content normal.  Vitals reviewed.  Pt was seen emergently due to BP and chest pain.  EKG - tachycardia, Q waves inferior and  nonspecific T waves inversion which are unchanged from 4/14.  New changes are ST depression in V4 and V5.     Assessment & Plan:  Chest pain, unspecified chest pain type - Plan: EKG 12-Lead, POCT CBC  HTN (hypertension), malignant - Plan: COMPLETE METABOLIC PANEL WITH GFR, TSH  Hyperlipidemia - Plan: Lipid panel  Microhematuria - Plan: POCT urinalysis dipstick  Screening for diabetes mellitus (DM) - Plan: POCT glucose (manual entry)  He has been off of his HTN and cholesterol medications for a year.  He is currently under an extreme amount of stress.  He should not restart smoking- he was commended for his smoking cessation.  Pt has HTN emergency and CP with EKG changes - he will be transported to the [facility] for further evaluation.    O2 2L [place] 0920 Monitor and [facility] called. Transported to ED  D/w Dr Myriam Forehand PA-C  [facility] 12/26/2013 9:35 AM

## 2013-12-26 NOTE — Plan of Care (Signed)
Problem: Phase I Progression Outcomes Goal: Hemodynamically stable Outcome: Completed/Met Date Met:  12/26/13 Goal: Anginal pain relieved Outcome: Completed/Met Date Met:  12/26/13 Goal: Aspirin unless contraindicated Outcome: Completed/Met Date Met:  12/26/13 Goal: MD aware of Cardiac Marker results Outcome: Completed/Met Date Met:  12/26/13 Goal: Voiding-avoid urinary catheter unless indicated Outcome: Completed/Met Date Met:  12/26/13 Goal: Other Phase I Outcomes/Goals Outcome: Completed/Met Date Met:  12/26/13

## 2013-12-26 NOTE — H&P (Signed)
CARDIOLOGY HISTORY AND PHYSICAL   Patient ID: Seth Keller MRN: [id number] DOB/AGE: 06-21-1965 48 y.o.  Admit date: 12/26/2013  Primary Physician   Lamar Blinks, MD Primary Cardiologist  New Reason for Consultation   Non-STEMI, hypertensive urgency  [id number] Seth Keller is a 48 y.o. male with no history of CAD.  He was diagnosed with hypertension approximately 15 years ago. He was living in [place] at the time. He moved to [place] approximately 2 years ago. He has seen a doctor once since then. He does not take any medications regularly. He states that he made dietary changes and felt those would manage his blood pressure. He does not have a blood pressure cuff at home and does not check his blood pressure. He was smoking up until a few days ago but states he has quit. He has no history of exertional shortness of breath or exertional chest pain.  2 days ago, he had an episode of chest pain that lasted less than 1 minute. It was nonexertional. It was a 4 or 5/10. There was no radiation and no associated symptoms. He has never had chest pain before or since then. iit resolved without intervention.  Yesterday, without exertion, he had sudden onset of shortness of breath. There was no associated nausea, vomiting or diaphoresis. His symptoms did not resolve. He took an aspirin and some Advil. The symptoms improved somewhat and he was able to sleep. However, this a.m. The shortness of breath began again shortly after waking and that's when he decided to seek help. He went to the urgent care and was transferred to [facility]. Currently, he denies shortness of breath.  He has no recent illnesses, fevers or chills. He is under some increased emotional stress because of the recent loss of his job. He has a supportive fianc. He will have trouble affording his medications and requests that we use the chief is possible products.  Past Medical History  Diagnosis Date  . Hypertension approx. 2000      Past Surgical History  Procedure Laterality Date  . None      Allergies  Allergen Reactions  . Shellfish Allergy Anaphylaxis    Throat close up, swelling    I have reviewed the patient's current medications . labetalol  20 mg Intravenous Once   . labetalol (NORMODYNE) infusion 2 mg/min (12/26/13 1108)    Prior to Admission medications   Medication Sig Start Date End Date Taking? Authorizing Provider  ibuprofen (ADVIL,MOTRIN) 200 MG tablet Take 400 mg by mouth 2 (two) times daily as needed for mild pain or moderate pain.   Yes Historical Provider, MD     History   Social History  . Marital Status: Single    Spouse Name: Seth Keller    Number of Children: Seth Keller  . Years of Education: Seth Keller   Occupational History  . Unemployed    Social History Main Topics  . Smoking status: Former Smoker -- 0.50 packs/day for 20 years    Types: Cigarettes    Quit date: 12/24/2013  . Smokeless tobacco: Never Used  . Alcohol Use: 0.0 oz/week    0 Not specified per week     Comment: socially, 1-2 x year  . Drug Use: No  . Sexual Activity: Yes    Birth Control/ Protection: None   Other Topics Concern  . Not on file   Social History Narrative   Lives with fiance.    Family Status  Relation Status Death Age  .  Mother Deceased 31s    "hardening of the arteries", may have died of an MI  . Father Deceased 87s    Hx asthma, no CAD  . Brother Alive     ROS:  Full 14 point review of systems complete and found to be negative unless listed above.  Physical Exam: Blood pressure 143/92, pulse 81, temperature 98.1 F (36.7 C), temperature source Oral, resp. rate 16, height 5\' 9"  (1.753 m), weight 228 lb (103.42 kg), SpO2 98 %.  General: Well developed, well nourished, male in no acute distress Head: Eyes PERRLA, No xanthomas.   Normocephalic and atraumatic, oropharynx without edema or exudate. Dentition: good Lungs: few rales bilaterally Heart: HRRR S1 S2, no rub/gallop, no significant  murmur. pulses are 2+ all 4 extrem.   Neck: No carotid bruits. No lymphadenopathy.  JVD not elevated. Abdomen: Bowel sounds present, abdomen soft and non-tender without masses or hernias noted. Msk:  No spine or cva tenderness. No weakness, no joint deformities or effusions. Extremities: No clubbing or cyanosis. no edema.  Neuro: Alert and oriented X 3. No focal deficits noted. Psych:  Good affect, responds appropriately Skin: No rashes or lesions noted.  Labs:   Lab Results  Component Value Date   WBC 10.0 12/26/2013   HGB 13.6 12/26/2013   HCT 40.5 12/26/2013   MCV 81.5 12/26/2013   PLT 259 12/26/2013     Recent Labs Lab 12/26/13 1036  NA 140  K 4.0  CL 102  CO2 23  BUN 15  CREATININE 1.63*  CALCIUM 8.7  GLUCOSE 102*    Recent Labs  12/26/13 1038  TROPONINI 1.83*    Recent Labs  12/26/13 1052  TROPIPOC 2.98*   Urinalysis    Component Value Date/Time   COLORURINE YELLOW 12/26/2013 1144   APPEARANCEUR CLEAR 12/26/2013 1144   LABSPEC 1.019 12/26/2013 1144   PHURINE 6.5 12/26/2013 1144   GLUCOSEU NEGATIVE 12/26/2013 1144   HGBUR SMALL* 12/26/2013 1144   BILIRUBINUR NEGATIVE 12/26/2013 1144   BILIRUBINUR neg 12/26/2013 0906   KETONESUR NEGATIVE 12/26/2013 1144   PROTEINUR >300* 12/26/2013 1144   PROTEINUR >300 12/26/2013 0906   UROBILINOGEN 0.2 12/26/2013 1144   UROBILINOGEN 0.2 12/26/2013 0906   NITRITE NEGATIVE 12/26/2013 1144   NITRITE neg 12/26/2013 0906   LEUKOCYTESUR NEGATIVE 12/26/2013 1144  Drugs of Abuse     Component Value Date/Time   LABOPIA NONE DETECTED 12/26/2013 1144   COCAINSCRNUR NONE DETECTED 12/26/2013 1144   LABBENZ NONE DETECTED 12/26/2013 1144   AMPHETMU NONE DETECTED 12/26/2013 1144   THCU NONE DETECTED 12/26/2013 1144   LABBARB NONE DETECTED 12/26/2013 1144     Echo: none  ECG:  Sinus rhythm, LVH with possible strain pattern  Radiology:  Dg Chest Port 1 View 12/26/2013   CLINICAL DATA:  48 year old male with  hypertensive urgency  EXAM: PORTABLE CHEST - 1 VIEW  COMPARISON:  None.  FINDINGS: Cardiomegaly with left ventricular prominence likely exaggerated by portable frontal technique. Lungs are clear. No pulmonary edema. No acute osseous abnormality.  IMPRESSION: 1. Mild cardiomegaly with left ventricular prominence. 2. No acute cardiopulmonary process.   Electronically Signed   By: Jacqulynn Cadet M.D.   On: 12/26/2013 11:38    ASSESSMENT AND PLAN:   The patient was seen today by Dr. Domenic Polite, the patient evaluated and the data reviewed.  Principal Problem:   Non-ST elevation myocardial infarction (NSTEMI), initial episode of care - Shortness of breath is possibly his anginal equivalent. Admit to stepdown, continue  to cycle cardiac enzymes, add heparin since his blood pressure is better controlled. Check an echocardiogram. Add nitrates, statin, and a beta blocker. Check a lipid profile and hepatic function.   Cardiac catheterization is indicated. The risks and benefits of a cardiac catheterization including, but not limited to, death, stroke, MI, kidney damage and bleeding were discussed with the patient who indicates understanding and agrees to proceed.   Active Problems:   Hypertensive urgency - Blood pressure is improved on a labetalol drip. Initial blood pressure was 222/148. He has not had medications in a year. Cost is a significant issue, he gets his medications at the [facility]. We will use the $4 list as much as possible. We will start Coreg and hydralazine for now.    Chronic renal insufficiency, stage III - his creatinine a year ago was 1.36. Now it is 1.63. This is likely secondary to hypertension. Avoid ACE inhibitor/ARB for pressure control. For now, will not use a diuretic as well. Follow.  SignedRosaria Ferries, PA-C 12/26/2013 12:39 PM Beeper [id number]  Co-Sign MD   Attending note:  Patient seen and examined. Reviewed records and discussed the case with Ms. Ahmed Prima PA-C. Patient  referred to the ER today from [facility] with recently worsening shortness of breath and a single episode of brief chest discomfort. He states that over the last few days he thought that he had a "cold" and took over-the-counter remedies. His symptoms did not improve. He felt a general discomfort in his chest that lasted approximately 10 seconds yesterday, this has not been recurring. He has a long-standing history of essential hypertension, has been noncompliant with medical therapy for at least the last 6 months. Blood pressure was markedly elevated at presentation, 218/142. He has been placed on a labetalol infusion per ED, blood pressure has come down quite nicely. He does feel somewhat better. Chest x-ray reports mild cardiomegaly without pulmonary edema. Lungs are clear and he has no obvious gallop on examination. Cardiac markers have however returned abnormal, troponin I 1.8. Also renal insufficiency with creatinine 1.6, and ECG shows sinus rhythm with nonspecific ST-T changes. It looks like prior outpatient antihypertensives included lisinopril HCTZ, and Norvasc. Situation discussed with patient and his fiance in the room. He will be admitted to the stepdown unit for further management. Plan to initiate oral labetalol and Norvasc in lieu of labetalol infusion. We will cycle cardiac markers, initiate heparin infusion now that blood pressure is better controlled. Also gentle hydration with follow-up BMET tomorrow. Echocardiogram will be obtained to assess cardiac structure and function. Ultimately, plan is to pursue cardiac catheterization to evaluate coronary anatomy. Certainly hypertensive emergency with demand ischemia is to be considered, however need to exclude true ACS. Patient has family history of CAD with brother having had stent placed in his 41s.  Satira Sark, M.D., F.A.C.C.

## 2013-12-26 NOTE — ED Notes (Signed)
Pt arrived from and Urgent care center. Pt stopped smoking 2 days ago, started having cp 2 days ago as well along with SOB that is intermittent. Pt currently has no pain or sob at this time. Pt has hx of HTN but has not had BP medication in the past 6 months. Sinus tach on monitor rate 100. bp-200/150.  12 lead shows new st depression v4 and v5.

## 2013-12-26 NOTE — Plan of Care (Signed)
Problem: Phase II Progression Outcomes Goal: Hemodynamically stable Outcome: Completed/Met Date Met:  12/26/13 Goal: Anginal pain relieved Outcome: Completed/Met Date Met:  12/26/13 Goal: Cath/PCI Day Path if indicated Outcome: Progressing Goal: Cardiac Rehab if ordered Outcome: Progressing Goal: If positive for MI, change to MI Path Outcome: Completed/Met Date Met:  12/26/13

## 2013-12-27 LAB — COMPREHENSIVE METABOLIC PANEL
ALK PHOS: 64 U/L (ref 39–117)
ALT: 33 U/L (ref 0–53)
AST: 25 U/L (ref 0–37)
Albumin: 2.5 g/dL — ABNORMAL LOW (ref 3.5–5.2)
Anion gap: 14 (ref 5–15)
BUN: 23 mg/dL (ref 6–23)
CO2: 23 meq/L (ref 19–32)
Calcium: 8.4 mg/dL (ref 8.4–10.5)
Chloride: 103 mEq/L (ref 96–112)
Creatinine, Ser: 2.21 mg/dL — ABNORMAL HIGH (ref 0.50–1.35)
GFR, EST AFRICAN AMERICAN: 39 mL/min — AB (ref >90)
GFR, EST NON AFRICAN AMERICAN: 33 mL/min — AB (ref >90)
GLUCOSE: 114 mg/dL — AB (ref 70–99)
POTASSIUM: 3.6 meq/L — AB (ref 3.7–5.3)
Sodium: 140 mEq/L (ref 137–147)
TOTAL PROTEIN: 6.2 g/dL (ref 6.0–8.3)

## 2013-12-27 LAB — CBC
HCT: 37.4 % — ABNORMAL LOW (ref 39.0–52.0)
Hemoglobin: 12.1 g/dL — ABNORMAL LOW (ref 13.0–17.0)
MCH: 26.9 pg (ref 26.0–34.0)
MCHC: 32.4 g/dL (ref 30.0–36.0)
MCV: 83.1 fL (ref 78.0–100.0)
PLATELETS: 219 10*3/uL (ref 150–400)
RBC: 4.5 MIL/uL (ref 4.22–5.81)
RDW: 13.9 % (ref 11.5–15.5)
WBC: 7.7 10*3/uL (ref 4.0–10.5)

## 2013-12-27 LAB — LIPID PANEL
Cholesterol: 225 mg/dL — ABNORMAL HIGH (ref 0–200)
HDL: 29 mg/dL — AB (ref >39)
LDL CALC: 164 mg/dL — AB (ref 0–99)
Total CHOL/HDL Ratio: 7.8 RATIO
Triglycerides: 159 mg/dL — ABNORMAL HIGH (ref <150)
VLDL: 32 mg/dL (ref 0–40)

## 2013-12-27 LAB — HEPARIN LEVEL (UNFRACTIONATED)
HEPARIN UNFRACTIONATED: 0.37 [IU]/mL (ref 0.30–0.70)
Heparin Unfractionated: 0.21 IU/mL — ABNORMAL LOW (ref 0.30–0.70)
Heparin Unfractionated: 0.31 IU/mL (ref 0.30–0.70)

## 2013-12-27 LAB — PROTIME-INR
INR: 1.06 (ref 0.00–1.49)
PROTHROMBIN TIME: 13.9 s (ref 11.6–15.2)

## 2013-12-27 LAB — TROPONIN I: TROPONIN I: 1.43 ng/mL — AB (ref <0.30)

## 2013-12-27 MED ORDER — ATORVASTATIN CALCIUM 40 MG PO TABS
40.0000 mg | ORAL_TABLET | Freq: Every day | ORAL | Status: DC
  Administered 2013-12-27 – 2014-01-06 (×10): 40 mg via ORAL
  Filled 2013-12-27 (×12): qty 1

## 2013-12-27 MED ORDER — CARVEDILOL 6.25 MG PO TABS
6.2500 mg | ORAL_TABLET | Freq: Two times a day (BID) | ORAL | Status: DC
  Administered 2013-12-27 – 2013-12-29 (×5): 6.25 mg via ORAL
  Filled 2013-12-27 (×5): qty 1

## 2013-12-27 NOTE — Plan of Care (Signed)
Problem: Phase I Progression Outcomes Goal: Aspirin unless contraindicated Outcome: Completed/Met Date Met:  12/27/13

## 2013-12-27 NOTE — Progress Notes (Signed)
ANTICOAGULATION CONSULT NOTE - Follow Up Consult  Pharmacy Consult for heparin Indication: chest pain/ACS  Allergies  Allergen Reactions  . Shellfish Allergy Anaphylaxis    Throat close up, swelling    Patient Measurements: Height: 5\' 9"  (175.3 cm) Weight: 228 lb 1.6 oz (103.465 kg) IBW/kg (Calculated) : 70.7 Heparin Dosing Weight: 93 kg  Vital Signs: Temp: 98.3 F (36.8 C) (11/08 1600) Temp Source: Oral (11/08 1600) BP: 133/102 mmHg (11/08 1600) Pulse Rate: 86 (11/08 1600)  Labs:  Recent Labs  12/26/13 0902 12/26/13 0906 12/26/13 1036  12/26/13 1740  12/26/13 2148 12/27/13 0300 12/27/13 0942 12/27/13 1822  HGB  --  14.2 13.6  --   --   --   --  12.1*  --   --   HCT  --  44.2 40.5  --   --   --   --  37.4*  --   --   PLT  --   --  259  --   --   --   --  219  --   --   LABPROT  --   --   --   --   --   --   --  13.9  --   --   INR  --   --   --   --   --   --   --  1.06  --   --   HEPARINUNFRC  --   --   --   --   --   < > 0.31 0.21* 0.37 0.31  CREATININE 1.73*  --  1.63*  --   --   --   --  2.21*  --   --   TROPONINI  --   --   --   < > 1.95*  --  1.18* 1.43*  --   --   < > = values in this interval not displayed.  Estimated Creatinine Clearance: 48.5 mL/min (by C-G formula based on Cr of 2.21).   Medications:  Scheduled:  . amLODipine  5 mg Oral Daily  . aspirin EC  81 mg Oral Daily  . atorvastatin  40 mg Oral q1800  . carvedilol  6.25 mg Oral BID WC   Infusions:  . sodium chloride 50 mL/hr at 12/26/13 1607  . heparin 1,500 Units/hr (12/27/13 [id number])    Assessment: 48 yo male with ACS is currently on therapeutic heparin.  Heparin level is 0.31.   Goal of Therapy:  Heparin level 0.3-0.7 units/ml Monitor platelets by anticoagulation protocol: Yes   Plan:  - Continue heparin at 1500 units/hr - daily heparin level and CBC  Tryone Kille, Tsz-Yin 12/27/2013,6:47 PM

## 2013-12-27 NOTE — Progress Notes (Signed)
ANTICOAGULATION CONSULT NOTE - Follow Up Consult  Pharmacy Consult for heparin Indication: NSTEMI  Labs:  Recent Labs  12/26/13 0902 12/26/13 0906 12/26/13 1036 12/26/13 1038 12/26/13 1740 12/26/13 2148  HGB  --  14.2 13.6  --   --   --   HCT  --  44.2 40.5  --   --   --   PLT  --   --  259  --   --   --   HEPARINUNFRC  --   --   --   --   --  0.31  CREATININE 1.73*  --  1.63*  --   --   --   TROPONINI  --   --   --  1.83* 1.95* 1.18*    Assessment/Plan:  48yo male therapeutic on heparin with initial dosing for NSTEMI (at low end of goal but will not change for now given hypertensive urgency, now better controlled). Will continue gtt at current rate and confirm stable with am labs.   Wynona Neat, PharmD, BCPS  12/27/2013,12:03 AM

## 2013-12-27 NOTE — Progress Notes (Signed)
Admitting cardiologist: Dr. Satira Sark  Seen for followup: Hypertensive urgency, NSTEMI  Subjective:    No chest pain, states that he feels better in general today. No palpitations, no orthopnea.  Objective:   Temp:  [97.6 F (36.4 C)-99 F (37.2 C)] 98.1 F (36.7 C) (11/08 0400) Pulse Rate:  [76-118] 84 (11/08 0400) Resp:  [16-28] 16 (11/08 0400) BP: (114-222)/(79-148) 146/89 mmHg (11/08 0400) SpO2:  [94 %-100 %] 97 % (11/08 0400) Weight:  [228 lb (103.42 kg)-228 lb 4.8 oz (103.556 kg)] 228 lb 1.6 oz (103.465 kg) (11/08 0400) Last BM Date: 12/25/13  Filed Weights   12/26/13 1010 12/26/13 1525 12/27/13 0400  Weight: 228 lb (103.42 kg) 228 lb 4.8 oz (103.556 kg) 228 lb 1.6 oz (103.465 kg)    Intake/Output Summary (Last 24 hours) at 12/27/13 0837 Last data filed at 12/27/13 0400  Gross per 24 hour  Intake 903.97 ml  Output    300 ml  Net 603.97 ml    Telemetry: Sinus rhythm.  Exam:  General: Appears comfortable at rest.  Lungs: Clear, nonlabored.  Cardiac: RRR, no gallop.  Abdomen: NABS.  Extremities: Trace edema.   Lab Results:  Basic Metabolic Panel:  Recent Labs Lab 12/26/13 0902 12/26/13 1036 12/27/13 0300  NA 140 140 140  K 4.0 4.0 3.6*  CL 105 102 103  CO2 25 23 23   GLUCOSE 108* 102* 114*  BUN 16 15 23   CREATININE 1.73* 1.63* 2.21*  CALCIUM 9.0 8.7 8.4    Liver Function Tests:  Recent Labs Lab 12/26/13 0902 12/27/13 0300  AST 21 25  ALT 27 33  ALKPHOS 69 64  BILITOT 0.4 <0.2*  PROT 7.0 6.2  ALBUMIN 3.5 2.5*    CBC:  Recent Labs Lab 12/26/13 0906 12/26/13 1036 12/27/13 0300  WBC 11.0* 10.0 7.7  HGB 14.2 13.6 12.1*  HCT 44.2 40.5 37.4*  MCV 84.8 81.5 83.1  PLT  --  259 219    Cardiac Enzymes:  Recent Labs Lab 12/26/13 1740 12/26/13 2148 12/27/13 0300  TROPONINI 1.95* 1.18* 1.43*    BNP:  Recent Labs  12/26/13 1740  PROBNP 6298.0*    Coagulation:  Recent Labs Lab 12/27/13 0300  INR 1.06     Lipid Panel:    Component Value Date/Time   CHOL 225* 12/27/2013 0300   TRIG 159* 12/27/2013 0300   HDL 29* 12/27/2013 0300   CHOLHDL 7.8 12/27/2013 0300   VLDL 32 12/27/2013 0300   LDLCALC 164* 12/27/2013 0300   LDLDIRECT 198* 05/29/2012 1744    Echocardiogram (12/27/2013): Study Conclusions  - Left ventricle: The cavity size was mildly dilated. Wall thickness was increased in a pattern of mild LVH. Systolic function was mildly reduced. The estimated ejection fraction was 45%. There is akinesis of the basal-midinferolateral and inferior myocardium. Features are consistent with a pseudonormal left ventricular filling pattern, with concomitant abnormal relaxation and increased filling pressure (grade 2 diastolic dysfunction). Doppler parameters are consistent with elevated ventricular end-diastolic filling pressure. - Aortic valve: Mildly calcified annulus. Trileaflet. There was no regurgitation. - Mitral valve: There was mild regurgitation directed eccentrically. - Left atrium: The atrium was moderately dilated. - Right atrium: The atrium was mildly dilated. Central venous pressure (est): 3 mm Hg. - Tricuspid valve: There was trivial regurgitation. - Pulmonary arteries: Systolic pressure could not be accurately estimated. - Pericardium, extracardiac: There was no pericardial effusion.  Impressions:  - Mild LVH with LVEF approximately 45%, inferolateral wall motion abnormalities suggestive  of ischemic cardiomyopathy. Grade 2 diastolic dysfunction with increased filling pressures. Moderate left atrial enlargement. Mild, eccentric mitral regurgitation. Unable to assess PASP.   Medications:   Scheduled Medications: . amLODipine  5 mg Oral Daily  . aspirin EC  81 mg Oral Daily  . labetalol  200 mg Oral BID     Infusions: . sodium chloride 50 mL/hr at 12/26/13 1607  . heparin 1,500 Units/hr (12/27/13 0620)     PRN  Medications:  acetaminophen, ALPRAZolam, labetalol, nitroGLYCERIN, ondansetron (ZOFRAN) IV, zolpidem   Assessment:   1. NSTEMI in the setting of hypertensive urgency, peak troponin I 1.9. Echocardiogram shows LVEF 45% with inferolateral wall motion abnormality concerning for underlying ischemic heart disease.  2. Essential hypertension, noncompliant with medical therapy for several months, presentation with hypertensive urgency.  3. Hyperlipidemia, LDL 164.  4. Acute on chronic renal insufficiency. Creatinine 1.3 in April 2014, 1.7 at presentation, up to 2.2 today despite hydration.   Plan/Discussion:    Plan at this time is to continue aspirin, heparin drip, Norvasc, convert from labetalol to Coreg. Hold off on ACE inhibitor or ARB. Add statin. Continue hydration. Likely need to defer heart catheterization tomorrow unless substantial improvement in renal function in the interim. If he does show improvement in the next day or so, would ideally proceed with coronaries only heart catheterization to evaluate coronary anatomy. If renal function continues to worsen, would involve nephrology. Patient already has primary care follow-up here in [place]. He will need to be established with cardiology provider here in [place] as well.   Satira Sark, M.D., F.A.C.C.

## 2013-12-27 NOTE — Plan of Care (Signed)
Problem: Phase I Progression Outcomes Goal: Anginal pain relieved Outcome: Completed/Met Date Met:  12/27/13 Goal: Voiding-avoid urinary catheter unless indicated Outcome: Completed/Met Date Met:  12/27/13 Goal: Hemodynamically stable Outcome: Completed/Met Date Met:  12/27/13

## 2013-12-27 NOTE — Progress Notes (Signed)
ANTICOAGULATION CONSULT NOTE - Follow Up Consult  Pharmacy Consult for heparin Indication: NSTEMI  Labs:  Recent Labs  12/26/13 0902 12/26/13 0906 12/26/13 1036 12/26/13 1038 12/26/13 1740 12/26/13 2148 12/27/13 0300  HGB  --  14.2 13.6  --   --   --  12.1*  HCT  --  44.2 40.5  --   --   --  37.4*  PLT  --   --  259  --   --   --  219  LABPROT  --   --   --   --   --   --  13.9  INR  --   --   --   --   --   --  1.06  HEPARINUNFRC  --   --   --   --   --  0.31 0.21*  CREATININE 1.73*  --  1.63*  --   --   --   --   TROPONINI  --   --   --  1.83* 1.95* 1.18*  --     Assessment: 48yo male now subtherapeutic on heparin after one level at low end of goal.  Goal of Therapy:  Heparin level 0.3-0.7 units/ml   Plan:  Will increase heparin gtt by 2 units/kg/hr to 1500 units/hr and check level in 6hr.  Wynona Neat, PharmD, BCPS  12/27/2013,3:46 AM

## 2013-12-27 NOTE — Progress Notes (Signed)
ANTICOAGULATION CONSULT NOTE - F/u Consult  Pharmacy Consult for heparin Indication: chest pain/ACS  Allergies  Allergen Reactions  . Shellfish Allergy Anaphylaxis    Throat close up, swelling    Patient Measurements: Height: 5\' 9"  (175.3 cm) Weight: 228 lb 1.6 oz (103.465 kg) IBW/kg (Calculated) : 70.7 Heparin Dosing Weight: 93 kg  Vital Signs: Temp: 98.1 F (36.7 C) (11/08 0400) BP: 146/89 mmHg (11/08 0400) Pulse Rate: 84 (11/08 0400)  Labs:  Recent Labs  12/26/13 0902 12/26/13 0906 12/26/13 1036  12/26/13 1740 12/26/13 2148 12/27/13 0300 12/27/13 0942  HGB  --  14.2 13.6  --   --   --  12.1*  --   HCT  --  44.2 40.5  --   --   --  37.4*  --   PLT  --   --  259  --   --   --  219  --   LABPROT  --   --   --   --   --   --  13.9  --   INR  --   --   --   --   --   --  1.06  --   HEPARINUNFRC  --   --   --   --   --  0.31 0.21* 0.37  CREATININE 1.73*  --  1.63*  --   --   --  2.21*  --   TROPONINI  --   --   --   < > 1.95* 1.18* 1.43*  --   < > = values in this interval not displayed.  Estimated Creatinine Clearance: 48.5 mL/min (by C-G formula based on Cr of 2.21).   Medical History: Past Medical History  Diagnosis Date  . Hypertension approx. 2000    Medications:  Scheduled:  . amLODipine  5 mg Oral Daily  . aspirin EC  81 mg Oral Daily  . atorvastatin  40 mg Oral q1800  . carvedilol  6.25 mg Oral BID WC   Infusions:  . sodium chloride 50 mL/hr at 12/26/13 1607  . heparin 1,500 Units/hr (12/27/13 [id number])    Assessment: 48 yo male continuing on heparin drip for NSTEMI. HL therapeutic (0.37) this AM x1 @1500  units/hr. Will check another 6-hr level to confirm. Hg slightly low 12.1, plt wnl. No bleeding documented.  Goal of Therapy:  Heparin level 0.3-0.7 units/ml Monitor platelets by anticoagulation protocol: Yes   Plan:  - Heparin drip at 1500 units/hr - 6hr HL to confirm @1700  - Daily HL and CBC - Monitor s/sx bleeding  Elicia Lamp,  PharmD Clinical Pharmacist - Resident Pager [phone] 12/27/2013 10:55 AM

## 2013-12-28 DIAGNOSIS — N179 Acute kidney failure, unspecified: Secondary | ICD-10-CM | POA: Diagnosis present

## 2013-12-28 DIAGNOSIS — R7989 Other specified abnormal findings of blood chemistry: Secondary | ICD-10-CM

## 2013-12-28 DIAGNOSIS — N189 Chronic kidney disease, unspecified: Secondary | ICD-10-CM

## 2013-12-28 DIAGNOSIS — R778 Other specified abnormalities of plasma proteins: Secondary | ICD-10-CM | POA: Diagnosis present

## 2013-12-28 LAB — CBC
HEMATOCRIT: 39.2 % (ref 39.0–52.0)
Hemoglobin: 12.8 g/dL — ABNORMAL LOW (ref 13.0–17.0)
MCH: 27.1 pg (ref 26.0–34.0)
MCHC: 32.7 g/dL (ref 30.0–36.0)
MCV: 82.9 fL (ref 78.0–100.0)
Platelets: 252 10*3/uL (ref 150–400)
RBC: 4.73 MIL/uL (ref 4.22–5.81)
RDW: 13.9 % (ref 11.5–15.5)
WBC: 8.6 10*3/uL (ref 4.0–10.5)

## 2013-12-28 LAB — BASIC METABOLIC PANEL
Anion gap: 13 (ref 5–15)
BUN: 19 mg/dL (ref 6–23)
CO2: 25 mEq/L (ref 19–32)
CREATININE: 1.92 mg/dL — AB (ref 0.50–1.35)
Calcium: 8.8 mg/dL (ref 8.4–10.5)
Chloride: 105 mEq/L (ref 96–112)
GFR, EST AFRICAN AMERICAN: 46 mL/min — AB (ref >90)
GFR, EST NON AFRICAN AMERICAN: 40 mL/min — AB (ref >90)
Glucose, Bld: 94 mg/dL (ref 70–99)
Potassium: 4.4 mEq/L (ref 3.7–5.3)
Sodium: 143 mEq/L (ref 137–147)

## 2013-12-28 LAB — HEMOGLOBIN A1C
Hgb A1c MFr Bld: 5.7 % — ABNORMAL HIGH (ref <5.7)
Mean Plasma Glucose: 117 mg/dL — ABNORMAL HIGH (ref <117)

## 2013-12-28 LAB — HEPARIN LEVEL (UNFRACTIONATED)
HEPARIN UNFRACTIONATED: 0.24 [IU]/mL — AB (ref 0.30–0.70)
Heparin Unfractionated: 0.47 IU/mL (ref 0.30–0.70)

## 2013-12-28 MED ORDER — SODIUM CHLORIDE 0.9 % IV SOLN: 250.0000 mL | INTRAVENOUS | Status: DC | PRN

## 2013-12-28 MED ORDER — FUROSEMIDE 10 MG/ML IJ SOLN
40.0000 mg | Freq: Once | INTRAMUSCULAR | Status: AC
  Administered 2013-12-28: 40 mg via INTRAVENOUS
  Filled 2013-12-28: qty 4

## 2013-12-28 MED ORDER — AMLODIPINE BESYLATE 10 MG PO TABS
10.0000 mg | ORAL_TABLET | Freq: Every day | ORAL | Status: DC
  Administered 2013-12-28 – 2013-12-31 (×4): 10 mg via ORAL
  Filled 2013-12-28 (×4): qty 1

## 2013-12-28 MED ORDER — ACTIVE PARTNERSHIP FOR HEALTH OF YOUR HEART BOOK
Freq: Once | Status: AC
  Administered 2013-12-28: 07:00:00
  Filled 2013-12-28: qty 1

## 2013-12-28 MED ORDER — SODIUM CHLORIDE 0.9 % IV SOLN: INTRAVENOUS | Status: DC

## 2013-12-28 MED ORDER — SODIUM CHLORIDE 0.9 % IJ SOLN: 3.0000 mL | Freq: Two times a day (BID) | INTRAMUSCULAR | Status: DC

## 2013-12-28 MED ORDER — ASPIRIN 81 MG PO CHEW
81.0000 mg | CHEWABLE_TABLET | ORAL | Status: AC
  Administered 2013-12-29: 81 mg via ORAL
  Filled 2013-12-28: qty 1

## 2013-12-28 MED ORDER — SODIUM CHLORIDE 0.9 % IV SOLN
INTRAVENOUS | Status: DC
  Administered 2013-12-28 – 2013-12-29 (×2): via INTRAVENOUS

## 2013-12-28 MED ORDER — HEART ATTACK BOUNCING BOOK
Freq: Once | Status: AC
  Administered 2013-12-28: 07:00:00
  Filled 2013-12-28: qty 1

## 2013-12-28 MED ORDER — SODIUM CHLORIDE 0.9 % IJ SOLN: 3.0000 mL | INTRAMUSCULAR | Status: DC | PRN

## 2013-12-28 NOTE — Care Management Note (Addendum)
    Page 1 of 2   12/31/2013     2:44:40 PM CARE MANAGEMENT NOTE 12/31/2013  Patient:  Seth Keller,Seth Keller   Account Number:  [id number]  Date Initiated:  12/28/2013  Documentation initiated by:  GRAVES-BIGELOW,Skylen Danielsen  Subjective/Objective Assessment:   Pt admitted for Nstemi and hypertensive urgency. Pt has no insurance and no PCP. Pt is from out of town, however will be residing in [place]. CM did call [facility] to assist with bill.     Action/Plan:   CM did call the [facility] for PCP and hospital f/u. Please assist pt with d/c meds to be generic for cost concerns. CM will continue to monitor.   Anticipated DC Date:  12/30/2013   Anticipated DC Plan:  HOME/SELF CARE  In-house referral  Financial Counselor      DC Planning Services  CM consult  Medication [facility]      Choice offered to / List presented to:             Status of service:  Completed, signed off Medicare Important Message given?  NO (If response is "NO", the following Medicare IM given date fields will be blank) Date Medicare IM given:   Medicare IM given by:   Date Additional Medicare IM given:   Additional Medicare IM given by:    Discharge Disposition:  HOME/SELF CARE  Per UR Regulation:  Reviewed for med. necessity/level of care/duration of stay  If discussed at [place] of Stay Meetings, dates discussed:   12/31/2013    Comments:  12-31-13 [place], RN, BSN [phone] Plan for CABG Friday. CM will continue to monitor for medicaiton, PCP and home needs as it gets closer to d/c.   12-30-13 1534 Lemoine Tivnan, RN,BSN [phone] Per MD notes tentative plan for OR- CABG. CM will continue to monitor for disposition needs.  12-29-13 1446 Kirklin Barno, RN,BSN [phone] Pt s/ p cath Significant three-vessel coronary artery disease with hemodynamically significant proximal mid LAD stenosis. Recommendations: Per MD notes: Consider surgical  revascularization versus LAD proximal and mid stenting as well as mid circumflex stenting. Pt will need hospital f/u set up for the [place] once medically stable for d/c. CM will continue to monitor.

## 2013-12-28 NOTE — Progress Notes (Signed)
ANTICOAGULATION CONSULT NOTE - Follow Up Consult  Pharmacy Consult for heparin Indication: chest pain/ACS  Allergies  Allergen Reactions  . Shellfish Allergy Anaphylaxis    Throat close up, swelling    Patient Measurements: Height: 5\' 9"  (175.3 cm) Weight: 225 lb 12 oz (102.4 kg) IBW/kg (Calculated) : 70.7 Heparin Dosing Weight: 93 kg  Vital Signs: Temp: 98.8 F (37.1 C) (11/09 1200) Temp Source: Oral (11/09 1200) BP: 164/102 mmHg (11/09 1205) Pulse Rate: 80 (11/09 1200)  Labs:  Recent Labs  12/26/13 1036  12/26/13 1740  12/26/13 2148 12/27/13 0300  12/27/13 1822 12/28/13 0305 12/28/13 1203  HGB 13.6  --   --   --   --  12.1*  --   --  12.8*  --   HCT 40.5  --   --   --   --  37.4*  --   --  39.2  --   PLT 259  --   --   --   --  219  --   --  252  --   LABPROT  --   --   --   --   --  13.9  --   --   --   --   INR  --   --   --   --   --  1.06  --   --   --   --   HEPARINUNFRC  --   --   --   < > 0.31 0.21*  < > 0.31 0.24* 0.47  CREATININE 1.63*  --   --   --   --  2.21*  --   --  1.92*  --   TROPONINI  --   < > 1.95*  --  1.18* 1.43*  --   --   --   --   < > = values in this interval not displayed.  Estimated Creatinine Clearance: 55.5 mL/min (by C-G formula based on Cr of 1.92).   Medical History: Past Medical History  Diagnosis Date  . Hypertension approx. 2000    Medications:  Scheduled:  . amLODipine  10 mg Oral Daily  . [START ON 12/29/2013] aspirin  81 mg Oral Pre-Cath  . aspirin EC  81 mg Oral Daily  . atorvastatin  40 mg Oral q1800  . carvedilol  6.25 mg Oral BID WC  . sodium chloride  3 mL Intravenous Q12H   Infusions:  . sodium chloride Stopped (12/28/13 0810)  . [START ON 12/29/2013] sodium chloride    . sodium chloride 50 mL/hr at 12/28/13 0814  . heparin 1,700 Units/hr (12/28/13 0421)    Assessment: 48 yo M started on heparin for CP.  Heparin is therapeutic on 1700 units/hr.  Plan for cardiac cath if SCr improves.  Goal of  Therapy:  Heparin level 0.3-0.7 units/ml Monitor platelets by anticoagulation protocol: Yes   Plan:  Continue heparin at 1700 units/hr. Continue daily heparin level and CBC. Follow up with plans for cardiac cath.  [employer], Pharm.D., BCPS Clinical Pharmacist Pager [phone] 12/28/2013 1:57 PM

## 2013-12-28 NOTE — Progress Notes (Signed)
ANTICOAGULATION CONSULT NOTE - Follow Up Consult  Pharmacy Consult for heparin Indication: NSTEMI  Labs:  Recent Labs  12/26/13 0902  12/26/13 1036  12/26/13 1740  12/26/13 2148 12/27/13 0300 12/27/13 0942 12/27/13 1822 12/28/13 0305  HGB  --   < > 13.6  --   --   --   --  12.1*  --   --  12.8*  HCT  --   < > 40.5  --   --   --   --  37.4*  --   --  39.2  PLT  --   --  259  --   --   --   --  219  --   --  252  LABPROT  --   --   --   --   --   --   --  13.9  --   --   --   INR  --   --   --   --   --   --   --  1.06  --   --   --   HEPARINUNFRC  --   --   --   --   --   < > 0.31 0.21* 0.37 0.31 0.24*  CREATININE 1.73*  --  1.63*  --   --   --   --  2.21*  --   --   --   TROPONINI  --   --   --   < > 1.95*  --  1.18* 1.43*  --   --   --   < > = values in this interval not displayed.   Assessment: 48yo male now subtherapeutic on heparin after two levels at low end of goal though had been trending down.  Goal of Therapy:  Heparin level 0.3-0.7 units/ml   Plan:  Will increase heparin gtt by 2 units/kg/hr and check level in [place], PharmD, BCPS  12/28/2013,4:14 AM

## 2013-12-28 NOTE — Plan of Care (Signed)
Problem: Phase II Progression Outcomes Goal: Stress Test if indicated Outcome: Not Applicable Date Met:  84/72/07 Goal: CV Risk Factors identified Outcome: Completed/Met Date Met:  12/28/13 Goal: Cardiac Rehab if ordered Outcome: Completed/Met Date Met:  12/28/13  Problem: Phase III Progression Outcomes Goal: Hemodynamically stable Outcome: Completed/Met Date Met:  12/28/13 Goal: Tolerating diet Outcome: Completed/Met Date Met:  12/28/13 Goal: If positive for MI, change to MI Path Outcome: Completed/Met Date Met:  12/28/13

## 2013-12-28 NOTE — Progress Notes (Addendum)
     SUBJECTIVE: No chest pain or SOB.   BP 147/87 mmHg  Pulse 85  Temp(Src) 99.2 F (37.3 C) (Oral)  Resp 18  Ht 5\' 9"  (1.753 m)  Wt 225 lb 12 oz (102.4 kg)  BMI 33.32 kg/m2  SpO2 99%  Intake/Output Summary (Last 24 hours) at 12/28/13 0741 Last data filed at 12/28/13 0421  Gross per 24 hour  Intake 1546.91 ml  Output   2775 ml  Net -1228.09 ml    PHYSICAL EXAM General: Well developed, well nourished, in no acute distress. Alert and oriented x 3.  Psych:  Good affect, responds appropriately Neck: No JVD. No masses noted.  Lungs: Clear bilaterally with no wheezes or rhonci noted.  Heart: RRR with no murmurs noted. Abdomen: Bowel sounds are present. Soft, non-tender.  Extremities: No lower extremity edema.   LABS: Basic Metabolic Panel:  Recent Labs  12/27/13 0300 12/28/13 0305  NA 140 143  K 3.6* 4.4  CL 103 105  CO2 23 25  GLUCOSE 114* 94  BUN 23 19  CREATININE 2.21* 1.92*  CALCIUM 8.4 8.8   CBC:  Recent Labs  12/26/13 1036 12/27/13 0300 12/28/13 0305  WBC 10.0 7.7 8.6  NEUTROABS 7.1  --   --   HGB 13.6 12.1* 12.8*  HCT 40.5 37.4* 39.2  MCV 81.5 83.1 82.9  PLT 259 219 252   Cardiac Enzymes:  Recent Labs  12/26/13 1740 12/26/13 2148 12/27/13 0300  TROPONINI 1.95* 1.18* 1.43*   Fasting Lipid Panel:  Recent Labs  12/27/13 0300  CHOL 225*  HDL 29*  LDLCALC 164*  TRIG 159*  CHOLHDL 7.8    Current Meds: . amLODipine  5 mg Oral Daily  . aspirin EC  81 mg Oral Daily  . atorvastatin  40 mg Oral q1800  . carvedilol  6.25 mg Oral BID WC    ASSESSMENT AND PLAN:  1. Elevated troponin: in the setting of hypertensive urgency, peak troponin I 1.9. Echocardiogram shows LVEF 45% with inferolateral wall motion abnormality concerning for underlying ischemic heart disease. As per plans of Dr. Domenic Polite, will need cardiac cath if renal function improves. Will repeat BMET in am. Will continue hydration today. Continue ASA and heparin drip. Will make  NPO at midnight. If renal function ok in am, plan coronaries only cath tomorrow. Cath orders placed for cath tomorrow.   2. Essential hypertension/Hypertensive urgency: noncompliant with medical therapy for several months, presentation with hypertensive urgency. BP better controlled. Will increase Norvasc to 10 mg daily.   3. Hyperlipidemia: Continue statin.   4. Acute on chronic renal insufficiency: Renal function is improving. Creatinine 1.3 in April 2014. Continue hydration today. Repeat BMET in am.    Seth Keller  11/9/20157:41 AM

## 2013-12-28 NOTE — Progress Notes (Signed)
Late entry - 2344, pt c/o shortness of breath with speaking.  O2 95% room air, lungs with bibasilar rales, pt also hypertensive.  Pt denied chest pain.  O2 at @2L  applied per [place] with saturation up to 100%, EKG obtained with no acute changes, 1 SL NTG with no change in symptoms.  MD notified with orders to [facility] IVF and administer 40 Lasix IV X 1.  Pt diuresed over 1 liter, denies shortness of breath at present.  Will continue to monitor.

## 2013-12-28 NOTE — Progress Notes (Signed)
CSW (Clinical Education officer, museum) received consult. Consult appropriate for RNCM. RNCM aware. Please reconsult should pt have hospital social work needs.   [place], [place]

## 2013-12-28 NOTE — Plan of Care (Signed)
Problem: Consults Goal: Tobacco Cessation referral if indicated Outcome: Completed/Met Date Met:  12/28/13

## 2013-12-29 ENCOUNTER — Other Ambulatory Visit: Payer: Self-pay | Admitting: *Deleted

## 2013-12-29 ENCOUNTER — Encounter (HOSPITAL_COMMUNITY)
Admission: EM | Disposition: A | Discharge status: Home / Self Care | Payer: Self-pay | Source: Home / Self Care | Attending: Surgery

## 2013-12-29 DIAGNOSIS — I2510 Atherosclerotic heart disease of native coronary artery without angina pectoris: Secondary | ICD-10-CM

## 2013-12-29 HISTORY — PX: LEFT HEART CATHETERIZATION WITH CORONARY ANGIOGRAM: SHX5451

## 2013-12-29 LAB — BASIC METABOLIC PANEL
Anion gap: 13 (ref 5–15)
BUN: 19 mg/dL (ref 6–23)
CALCIUM: 8.8 mg/dL (ref 8.4–10.5)
CO2: 26 mEq/L (ref 19–32)
Chloride: 102 mEq/L (ref 96–112)
Creatinine, Ser: 1.78 mg/dL — ABNORMAL HIGH (ref 0.50–1.35)
GFR, EST AFRICAN AMERICAN: 50 mL/min — AB (ref >90)
GFR, EST NON AFRICAN AMERICAN: 43 mL/min — AB (ref >90)
GLUCOSE: 92 mg/dL (ref 70–99)
Potassium: 4 mEq/L (ref 3.7–5.3)
SODIUM: 141 meq/L (ref 137–147)

## 2013-12-29 LAB — CBC
HCT: 38.9 % — ABNORMAL LOW (ref 39.0–52.0)
Hemoglobin: 12.7 g/dL — ABNORMAL LOW (ref 13.0–17.0)
MCH: 27 pg (ref 26.0–34.0)
MCHC: 32.6 g/dL (ref 30.0–36.0)
MCV: 82.6 fL (ref 78.0–100.0)
Platelets: 250 10*3/uL (ref 150–400)
RBC: 4.71 MIL/uL (ref 4.22–5.81)
RDW: 13.6 % (ref 11.5–15.5)
WBC: 8.5 10*3/uL (ref 4.0–10.5)

## 2013-12-29 LAB — POCT ACTIVATED CLOTTING TIME: Activated Clotting Time: 720 seconds

## 2013-12-29 LAB — HEPARIN LEVEL (UNFRACTIONATED): HEPARIN UNFRACTIONATED: 0.54 [IU]/mL (ref 0.30–0.70)

## 2013-12-29 SURGERY — LEFT HEART CATHETERIZATION WITH CORONARY ANGIOGRAM
Anesthesia: LOCAL

## 2013-12-29 MED ORDER — METOPROLOL TARTRATE 1 MG/ML IV SOLN
INTRAVENOUS | Status: AC
  Filled 2013-12-29: qty 5

## 2013-12-29 MED ORDER — HEPARIN (PORCINE) IN NACL 100-0.45 UNIT/ML-% IJ SOLN
1700.0000 [IU]/h | INTRAMUSCULAR | Status: DC
  Administered 2013-12-29 – 2013-12-31 (×4): 1700 [IU]/h via INTRAVENOUS
  Filled 2013-12-29 (×4): qty 250

## 2013-12-29 MED ORDER — FENTANYL CITRATE 0.05 MG/ML IJ SOLN
INTRAMUSCULAR | Status: AC
  Filled 2013-12-29: qty 2

## 2013-12-29 MED ORDER — LIDOCAINE HCL (PF) 1 % IJ SOLN
INTRAMUSCULAR | Status: AC
  Filled 2013-12-29: qty 30

## 2013-12-29 MED ORDER — CARVEDILOL 12.5 MG PO TABS
12.5000 mg | ORAL_TABLET | Freq: Two times a day (BID) | ORAL | Status: DC
  Administered 2013-12-29 – 2013-12-31 (×5): 12.5 mg via ORAL
  Filled 2013-12-29 (×3): qty 1
  Filled 2013-12-29: qty 2
  Filled 2013-12-29: qty 1

## 2013-12-29 MED ORDER — FUROSEMIDE 10 MG/ML IJ SOLN
40.0000 mg | Freq: Once | INTRAMUSCULAR | Status: AC
  Administered 2013-12-29: 40 mg via INTRAVENOUS

## 2013-12-29 MED ORDER — FUROSEMIDE 10 MG/ML IJ SOLN
INTRAMUSCULAR | Status: AC
  Filled 2013-12-29: qty 4

## 2013-12-29 MED ORDER — MIDAZOLAM HCL 2 MG/2ML IJ SOLN
INTRAMUSCULAR | Status: AC
  Filled 2013-12-29: qty 2

## 2013-12-29 MED ORDER — VERAPAMIL HCL 2.5 MG/ML IV SOLN
INTRAVENOUS | Status: AC
  Filled 2013-12-29: qty 2

## 2013-12-29 MED ORDER — OXYCODONE-ACETAMINOPHEN 5-325 MG PO TABS: 1.0000 | ORAL_TABLET | ORAL | Status: DC | PRN

## 2013-12-29 MED ORDER — METHYLPREDNISOLONE SODIUM SUCC 125 MG IJ SOLR
INTRAMUSCULAR | Status: AC
  Filled 2013-12-29: qty 2

## 2013-12-29 MED ORDER — HYDRALAZINE HCL 20 MG/ML IJ SOLN
INTRAMUSCULAR | Status: AC
  Filled 2013-12-29: qty 1

## 2013-12-29 MED ORDER — SODIUM CHLORIDE 0.9 % IV SOLN: INTRAVENOUS | Status: DC

## 2013-12-29 MED ORDER — HEPARIN (PORCINE) IN NACL 2-0.9 UNIT/ML-% IJ SOLN
INTRAMUSCULAR | Status: AC
  Filled 2013-12-29: qty 1500

## 2013-12-29 MED ORDER — DIPHENHYDRAMINE HCL 50 MG/ML IJ SOLN
INTRAMUSCULAR | Status: AC
  Filled 2013-12-29: qty 1

## 2013-12-29 MED ORDER — ADENOSINE 12 MG/4ML IV SOLN
16.0000 mL | Freq: Once | INTRAVENOUS | Status: DC
  Filled 2013-12-29: qty 16

## 2013-12-29 MED ORDER — HEPARIN SODIUM (PORCINE) 1000 UNIT/ML IJ SOLN
INTRAMUSCULAR | Status: AC
  Filled 2013-12-29: qty 1

## 2013-12-29 MED ORDER — NITROGLYCERIN 1 MG/10 ML FOR IR/CATH LAB
INTRA_ARTERIAL | Status: AC
  Filled 2013-12-29: qty 10

## 2013-12-29 NOTE — Progress Notes (Signed)
     SUBJECTIVE: No chest pain. No SOB.   BP 166/105 mmHg  Pulse 85  Temp(Src) 98.6 F (37 C) (Oral)  Resp 19  Ht 5\' 9"  (1.753 m)  Wt 228 lb 1.6 oz (103.465 kg)  BMI 33.67 kg/m2  SpO2 99%  Intake/Output Summary (Last 24 hours) at 12/29/13 0954 Last data filed at 12/28/13 2200  Gross per 24 hour  Intake    201 ml  Output    450 ml  Net   -249 ml    PHYSICAL EXAM General: Well developed, well nourished, in no acute distress. Alert and oriented x 3.  Psych:  Good affect, responds appropriately Neck: No JVD. No masses noted.  Lungs: Clear bilaterally with no wheezes or rhonci noted.  Heart: RRR with no murmurs noted. Abdomen: Bowel sounds are present. Soft, non-tender.  Extremities: No lower extremity edema.   LABS: Basic Metabolic Panel:  Recent Labs  12/28/13 0305 12/29/13 0336  NA 143 141  K 4.4 4.0  CL 105 102  CO2 25 26  GLUCOSE 94 92  BUN 19 19  CREATININE 1.92* 1.78*  CALCIUM 8.8 8.8   CBC:  Recent Labs  12/26/13 1036  12/28/13 0305 12/29/13 0336  WBC 10.0  < > 8.6 8.5  NEUTROABS 7.1  --   --   --   HGB 13.6  < > 12.8* 12.7*  HCT 40.5  < > 39.2 38.9*  MCV 81.5  < > 82.9 82.6  PLT 259  < > 252 250  < > = values in this interval not displayed. Cardiac Enzymes:  Recent Labs  12/26/13 1740 12/26/13 2148 12/27/13 0300  TROPONINI 1.95* 1.18* 1.43*   Fasting Lipid Panel:  Recent Labs  12/27/13 0300  CHOL 225*  HDL 29*  LDLCALC 164*  TRIG 159*  CHOLHDL 7.8    Current Meds: . amLODipine  10 mg Oral Daily  . aspirin EC  81 mg Oral Daily  . atorvastatin  40 mg Oral q1800  . carvedilol  6.25 mg Oral BID WC  . sodium chloride  3 mL Intravenous Q12H    ASSESSMENT AND PLAN:  1. Elevated troponin: in the setting of hypertensive urgency, peak troponin  1.9. Echocardiogram shows LVEF 45% with inferolateral wall motion abnormality concerning for underlying ischemic heart disease. As per plans of Dr. Domenic Polite, will need cardiac cath. Will  plan coronary only cath today. Will continue hydration today. Continue ASA and heparin drip.   2. Essential hypertension/Hypertensive urgency: noncompliant with medical therapy for several months, presentation with hypertensive urgency. BP better controlled but still need titration of meds. Will increase Coreg to 12.5 mg po BID.  3. Hyperlipidemia: Continue statin.   4. Acute on chronic renal insufficiency: Renal function is improving. Creatinine 1.7 today. Continue hydration today. Hydrate post cath today  Will need to monitor 24 hours post cath to titrate BP meds and follow renal function  MCALHANY,CHRISTOPHER  11/10/20159:54 AM

## 2013-12-29 NOTE — Progress Notes (Signed)
ANTICOAGULATION CONSULT NOTE - Follow Up Consult  Pharmacy Consult for heparin Indication: CAD s/p cath  Allergies  Allergen Reactions  . Shellfish Allergy Anaphylaxis    Throat close up, swelling    Patient Measurements: Height: 5\' 9"  (175.3 cm) Weight: 228 lb 1.6 oz (103.465 kg) IBW/kg (Calculated) : 70.7 Heparin Dosing Weight: 93 kg  Vital Signs: Temp: 98.2 F (36.8 C) (11/10 1150) Temp Source: Oral (11/10 1150) BP: 166/104 mmHg (11/10 1533) Pulse Rate: 88 (11/10 1533)  Labs:  Recent Labs  12/26/13 1740  12/26/13 2148  12/27/13 0300  12/28/13 0305 12/28/13 1203 12/29/13 0336  HGB  --   --   --   < > 12.1*  --  12.8*  --  12.7*  HCT  --   --   --   --  37.4*  --  39.2  --  38.9*  PLT  --   --   --   --  219  --  252  --  250  LABPROT  --   --   --   --  13.9  --   --   --   --   INR  --   --   --   --  1.06  --   --   --   --   HEPARINUNFRC  --   < > 0.31  --  0.21*  < > 0.24* 0.47 0.54  CREATININE  --   --   --   --  2.21*  --  1.92*  --  1.78*  TROPONINI 1.95*  --  1.18*  --  1.43*  --   --   --   --   < > = values in this interval not displayed.  Estimated Creatinine Clearance: 60.2 mL/min (by C-G formula based on Cr of 1.78).   Medical History: Past Medical History  Diagnosis Date  . Hypertension approx. 2000    Medications:  Scheduled:  . amLODipine  10 mg Oral Daily  . aspirin EC  81 mg Oral Daily  . atorvastatin  40 mg Oral q1800  . carvedilol  12.5 mg Oral BID WC   Infusions:  . sodium chloride Stopped (12/29/13 1548)    Assessment: 48 yo M started on heparin for CP.  Pt is s/p cardiac cath with 3vd pending decision of surgical revascularization vs stenting.  Sheath pulled at 2pm.  Pt was previously therapeutic on heparin at 1700 units/hr.  Will restart 8 hours after sheath pull at previously therapeutic rate.  Goal of Therapy:  Heparin level 0.3-0.7 units/ml Monitor platelets by anticoagulation protocol: Yes   Plan:  Restart heparin  at 1700 units/hr at 10pm tonight. Continue daily heparin level and CBC. Follow up with plans for revascularization.  [employer], Pharm.D., BCPS Clinical Pharmacist Pager [phone] 12/29/2013 4:08 PM

## 2013-12-29 NOTE — Plan of Care (Signed)
Problem: Phase I Progression Outcomes Goal: Pain controlled with appropriate interventions Outcome: Completed/Met Date Met:  12/29/13

## 2013-12-29 NOTE — Progress Notes (Signed)
ANTICOAGULATION CONSULT NOTE - Follow Up Consult  Pharmacy Consult for heparin Indication: chest pain/ACS  Allergies  Allergen Reactions  . Shellfish Allergy Anaphylaxis    Throat close up, swelling    Patient Measurements: Height: 5\' 9"  (175.3 cm) Weight: 228 lb 1.6 oz (103.465 kg) IBW/kg (Calculated) : 70.7 Heparin Dosing Weight: 93 kg  Vital Signs: Temp: 98.6 F (37 C) (11/10 0738) Temp Source: Oral (11/10 0738) BP: 184/109 mmHg (11/10 0738) Pulse Rate: 84 (11/10 0738)  Labs:  Recent Labs  12/26/13 1740  12/26/13 2148 12/27/13 0300  12/28/13 0305 12/28/13 1203 12/29/13 0336  HGB  --   --   --  12.1*  --  12.8*  --  12.7*  HCT  --   --   --  37.4*  --  39.2  --  38.9*  PLT  --   --   --  219  --  252  --  250  LABPROT  --   --   --  13.9  --   --   --   --   INR  --   --   --  1.06  --   --   --   --   HEPARINUNFRC  --   < > 0.31 0.21*  < > 0.24* 0.47 0.54  CREATININE  --   --   --  2.21*  --  1.92*  --  1.78*  TROPONINI 1.95*  --  1.18* 1.43*  --   --   --   --   < > = values in this interval not displayed.  Estimated Creatinine Clearance: 60.2 mL/min (by C-G formula based on Cr of 1.78).   Medical History: Past Medical History  Diagnosis Date  . Hypertension approx. 2000    Medications:  Scheduled:  . amLODipine  10 mg Oral Daily  . aspirin EC  81 mg Oral Daily  . atorvastatin  40 mg Oral q1800  . carvedilol  6.25 mg Oral BID WC  . sodium chloride  3 mL Intravenous Q12H   Infusions:  . sodium chloride Stopped (12/28/13 0810)  . sodium chloride 75 mL/hr at 12/29/13 0530  . heparin 1,700 Units/hr (12/29/13 0746)    Assessment: 48 yo M started on heparin for CP.  Heparin is therapeutic on 1700 units/hr.  Plan for cardiac cath if SCr improves.  Goal of Therapy:  Heparin level 0.3-0.7 units/ml Monitor platelets by anticoagulation protocol: Yes   Plan:  Continue heparin at 1700 units/hr. Continue daily heparin level and CBC. Follow up with  plans for cardiac cath.  [employer], Pharm.D., BCPS Clinical Pharmacist Pager [phone] 12/29/2013 9:45 AM

## 2013-12-29 NOTE — CV Procedure (Addendum)
     Left Heart Catheterization with Coronary Angiography Report  Seth Keller  48 y.o.  male 03-Mar-1965  Procedure Date: 12/29/2013 Referring Physician: Darlina Guys, MD Primary Cardiologist: Angelena Form  INDICATIONS: Acute coronary syndrome, severe hypertension, left ventricular dysfunction  PROCEDURE: 1. Left heart catheterization; 2. Left hemodynamic recordings; 3.Coronary angiography  CONSENT:  The risks, benefits, and details of the procedure were explained in detail to the patient. Risks including death, stroke, heart attack, kidney injury, allergy, limb ischemia, bleeding and radiation injury were discussed.  The patient verbalized understanding and wanted to proceed.  Informed written consent was obtained.  PROCEDURE TECHNIQUE:  After Xylocaine anesthesia a 5 French Slender sheath was placed in the right radial artery with an angiocath and the modified Seldinger technique.  Coronary angiography was done using a 5 F JR4, JL 3.5 cm catheter.  Left ventriculography was done using the JR4 catheter and hand injection.   The angiograms demonstrated three-vessel coronary disease. Of the vessels involved the disease in the proximal to mid LAD appeared moderate. For that reason FFR was performed at a point in the mid LAD beyond the proximal and mid disease noted on angiography. Adenosine was infused. FFR was 0.78.  The procedure was terminated and hemostasis achieved with a wrist band.   CONTRAST:  Total of 70 cc.  COMPLICATIONS:  Dyspnea with the Dennison   HEMODYNAMICS:  Aortic pressure 178/107 mmHg; LV pressure 180/24 mmHg; LVEDP 37 mmHg  ANGIOGRAPHIC DATA:   The left main coronary artery is widely patent.  The left anterior descending artery is large and wraps around the left ventricular apex. There is a proximal eccentric 70% stenosis and a mid eccentric 60-70% stenosis. FFR beyond the mid vessel stenosis was 0.78. The apical LAD contains 90% segmental stenosis before the apical  segment. The first diagonal is totally occluded.  The left circumflex artery is severely and diffusely diseased. The mid circumflex contains an eccentric 80% stenosis. There is total occlusion of a moderate sized first obtuse marginal that arises beyond an 80% stenosis in the mid vessel. The distal vessel is diffusely diseased with up to 85% stenosis before the margin of the second obtuse marginal and the PDA.Marland Kitchen  The ramus intermedius is moderate in size and contains proximal to mid diffuse disease with up to 80% stenosis. The distal vessel may not be graftable.  The right coronary artery is nondominant. The right coronary is totally occluded in the mid-segment.   LEFT VENTRICULOGRAM:  Left ventricular angiogram was not done. Pressure recordings revealed markedly elevated LVEDP.   IMPRESSIONS:  1. Significant three-vessel coronary artery disease with hemodynamically significant proximal mid LAD stenosis by FFR, significant mid ramus intermedius obstruction, significant circumflex and obtuse marginal disease, and nondominant totally occluded RCA.  2. Chronic kidney disease, stage III  3. Decreased LV function with inferior wall motion abnormality and estimated EF of 45% by echo. Markedly elevated LVEDP   RECOMMENDATION:  Aggressive medical therapy to control blood pressure.  IV hydration.  Monitor renal function.  Consider surgical revascularization versus LAD proximal and mid stenting as well as mid circumflex stenting. Heart team approach should probably be used before making final decision.Marland Kitchen

## 2013-12-29 NOTE — Plan of Care (Signed)
Problem: Consults Goal: Skin Care Protocol Initiated - if Braden Score 18 or less If consults are not indicated, leave blank or document N/A  Outcome: Not Applicable Date Met:  33/83/29 Goal: Nutrition Consult-if indicated Outcome: Not Applicable Date Met:  19/16/60 Goal: Diabetes Guidelines if Diabetic/Glucose > 140 If diabetic or lab glucose is > 140 mg/dl - Initiate Diabetes/Hyperglycemia Guidelines & Document Interventions  Outcome: Not Applicable Date Met:  60/04/59  Problem: Phase II Progression Outcomes Goal: Anginal pain absent Outcome: Completed/Met Date Met:  12/29/13 Goal: Hemodynamically stable Outcome: Completed/Met Date Met:  12/29/13

## 2013-12-29 NOTE — H&P (View-Only) (Signed)
     SUBJECTIVE: No chest pain. No SOB.   BP 166/105 mmHg  Pulse 85  Temp(Src) 98.6 F (37 C) (Oral)  Resp 19  Ht 5\' 9"  (1.753 m)  Wt 228 lb 1.6 oz (103.465 kg)  BMI 33.67 kg/m2  SpO2 99%  Intake/Output Summary (Last 24 hours) at 12/29/13 0954 Last data filed at 12/28/13 2200  Gross per 24 hour  Intake    201 ml  Output    450 ml  Net   -249 ml    PHYSICAL EXAM General: Well developed, well nourished, in no acute distress. Alert and oriented x 3.  Psych:  Good affect, responds appropriately Neck: No JVD. No masses noted.  Lungs: Clear bilaterally with no wheezes or rhonci noted.  Heart: RRR with no murmurs noted. Abdomen: Bowel sounds are present. Soft, non-tender.  Extremities: No lower extremity edema.   LABS: Basic Metabolic Panel:  Recent Labs  12/28/13 0305 12/29/13 0336  NA 143 141  K 4.4 4.0  CL 105 102  CO2 25 26  GLUCOSE 94 92  BUN 19 19  CREATININE 1.92* 1.78*  CALCIUM 8.8 8.8   CBC:  Recent Labs  12/26/13 1036  12/28/13 0305 12/29/13 0336  WBC 10.0  < > 8.6 8.5  NEUTROABS 7.1  --   --   --   HGB 13.6  < > 12.8* 12.7*  HCT 40.5  < > 39.2 38.9*  MCV 81.5  < > 82.9 82.6  PLT 259  < > 252 250  < > = values in this interval not displayed. Cardiac Enzymes:  Recent Labs  12/26/13 1740 12/26/13 2148 12/27/13 0300  TROPONINI 1.95* 1.18* 1.43*   Fasting Lipid Panel:  Recent Labs  12/27/13 0300  CHOL 225*  HDL 29*  LDLCALC 164*  TRIG 159*  CHOLHDL 7.8    Current Meds: . amLODipine  10 mg Oral Daily  . aspirin EC  81 mg Oral Daily  . atorvastatin  40 mg Oral q1800  . carvedilol  6.25 mg Oral BID WC  . sodium chloride  3 mL Intravenous Q12H    ASSESSMENT AND PLAN:  1. Elevated troponin: in the setting of hypertensive urgency, peak troponin  1.9. Echocardiogram shows LVEF 45% with inferolateral wall motion abnormality concerning for underlying ischemic heart disease. As per plans of Dr. Domenic Polite, will need cardiac cath. Will  plan coronary only cath today. Will continue hydration today. Continue ASA and heparin drip.   2. Essential hypertension/Hypertensive urgency: noncompliant with medical therapy for several months, presentation with hypertensive urgency. BP better controlled but still need titration of meds. Will increase Coreg to 12.5 mg po BID.  3. Hyperlipidemia: Continue statin.   4. Acute on chronic renal insufficiency: Renal function is improving. Creatinine 1.7 today. Continue hydration today. Hydrate post cath today  Will need to monitor 24 hours post cath to titrate BP meds and follow renal function  Seth Keller  11/10/20159:54 AM

## 2013-12-29 NOTE — Interval H&P Note (Signed)
Cath Lab Visit (complete for each Cath Lab visit)  Clinical Evaluation Leading to the Procedure:   ACS: No.  Non-ACS:    Anginal Classification: No Symptoms  Anti-ischemic medical therapy: Minimal Therapy (1 class of medications)  Non-Invasive Test Results: No non-invasive testing performed  Prior CABG: No previous CABG      History and Physical Interval Note:  12/29/2013 12:58 PM  Seth Keller  has presented today for surgery, with the diagnosis of c/p  The various methods of treatment have been discussed with the patient and family. After consideration of risks, benefits and other options for treatment, the patient has consented to  Procedure(s): LEFT HEART CATHETERIZATION WITH CORONARY ANGIOGRAM (N/A) as a surgical intervention .  The patient's history has been reviewed, patient examined, no change in status, stable for surgery.  I have reviewed the patient's chart and labs.  Questions were answered to the patient's satisfaction.     Sinclair Grooms

## 2013-12-29 NOTE — Progress Notes (Signed)
Pt BP 190/123 pt up using urinal. Coreg and 10mg  of lebetalol given. Pt reports SOB is better resp rate down to 21/min, O2 sats 98%. Lung sounds clear. Will continue to monitor.

## 2013-12-29 NOTE — Progress Notes (Signed)
Received patient from cath lab, ambulated to bed and patient become short of breath with ambulating only few feet from stretcher to bed.  Oxygen 91-92% room air.  Oxygen applied at 3 liters sating 97%.  Patient still short of breath after oxygen increased to 4 liters sating 98%, with respirations still 33-38.  Fine crackles at bases.  Dr. Angelena Form paged and notified.  Orders received for IV lasix.  Primary RN Larene Beach updated.  Will continue to monitor.  Sanda Linger

## 2013-12-30 ENCOUNTER — Inpatient Hospital Stay (HOSPITAL_COMMUNITY): Payer: Medicaid Other

## 2013-12-30 DIAGNOSIS — I5021 Acute systolic (congestive) heart failure: Secondary | ICD-10-CM

## 2013-12-30 DIAGNOSIS — I2510 Atherosclerotic heart disease of native coronary artery without angina pectoris: Secondary | ICD-10-CM

## 2013-12-30 DIAGNOSIS — I255 Ischemic cardiomyopathy: Secondary | ICD-10-CM | POA: Diagnosis present

## 2013-12-30 DIAGNOSIS — N183 Chronic kidney disease, stage 3 (moderate): Secondary | ICD-10-CM

## 2013-12-30 DIAGNOSIS — I25110 Atherosclerotic heart disease of native coronary artery with unstable angina pectoris: Secondary | ICD-10-CM

## 2013-12-30 LAB — PULMONARY FUNCTION TEST
DL/VA % pred: 119 %
DL/VA: 5.49 ml/min/mmHg/L
DLCO UNC % PRED: 86 %
DLCO cor % pred: 89 %
DLCO cor: 27.59 ml/min/mmHg
DLCO unc: 26.69 ml/min/mmHg
FEF 25-75 Post: 1.57 L/sec
FEF 25-75 Pre: 1.16 L/sec
FEF2575-%Change-Post: 35 %
FEF2575-%PRED-POST: 47 %
FEF2575-%Pred-Pre: 35 %
FEV1-%CHANGE-POST: 11 %
FEV1-%PRED-POST: 65 %
FEV1-%Pred-Pre: 58 %
FEV1-POST: 2.14 L
FEV1-Pre: 1.92 L
FEV1FVC-%CHANGE-POST: 1 %
FEV1FVC-%Pred-Pre: 84 %
FEV6-%Change-Post: 8 %
FEV6-%Pred-Post: 77 %
FEV6-%Pred-Pre: 71 %
FEV6-POST: 3.08 L
FEV6-Pre: 2.83 L
FEV6FVC-%CHANGE-POST: 0 %
FEV6FVC-%PRED-PRE: 102 %
FEV6FVC-%Pred-Post: 102 %
FVC-%Change-Post: 9 %
FVC-%PRED-PRE: 70 %
FVC-%Pred-Post: 76 %
FVC-PRE: 2.85 L
FVC-Post: 3.12 L
PRE FEV1/FVC RATIO: 67 %
PRE FEV6/FVC RATIO: 99 %
Post FEV1/FVC ratio: 69 %
Post FEV6/FVC ratio: 99 %
RV % PRED: 153 %
RV: 2.99 L
TLC % pred: 88 %
TLC: 6.01 L

## 2013-12-30 LAB — BASIC METABOLIC PANEL
Anion gap: 15 (ref 5–15)
BUN: 26 mg/dL — AB (ref 6–23)
CHLORIDE: 103 meq/L (ref 96–112)
CO2: 23 meq/L (ref 19–32)
Calcium: 9.6 mg/dL (ref 8.4–10.5)
Creatinine, Ser: 1.91 mg/dL — ABNORMAL HIGH (ref 0.50–1.35)
GFR calc Af Amer: 46 mL/min — ABNORMAL LOW (ref >90)
GFR calc non Af Amer: 40 mL/min — ABNORMAL LOW (ref >90)
GLUCOSE: 155 mg/dL — AB (ref 70–99)
POTASSIUM: 4.5 meq/L (ref 3.7–5.3)
Sodium: 141 mEq/L (ref 137–147)

## 2013-12-30 LAB — CBC
HCT: 40.5 % (ref 39.0–52.0)
HEMOGLOBIN: 13.5 g/dL (ref 13.0–17.0)
MCH: 27.3 pg (ref 26.0–34.0)
MCHC: 33.3 g/dL (ref 30.0–36.0)
MCV: 82 fL (ref 78.0–100.0)
Platelets: 295 10*3/uL (ref 150–400)
RBC: 4.94 MIL/uL (ref 4.22–5.81)
RDW: 13.6 % (ref 11.5–15.5)
WBC: 15.6 10*3/uL — AB (ref 4.0–10.5)

## 2013-12-30 LAB — HEPARIN LEVEL (UNFRACTIONATED)
HEPARIN UNFRACTIONATED: 0.46 [IU]/mL (ref 0.30–0.70)
Heparin Unfractionated: 0.58 IU/mL (ref 0.30–0.70)

## 2013-12-30 MED ORDER — FUROSEMIDE 40 MG PO TABS
40.0000 mg | ORAL_TABLET | Freq: Every day | ORAL | Status: DC
  Administered 2013-12-30 – 2013-12-31 (×2): 40 mg via ORAL
  Filled 2013-12-30 (×2): qty 1

## 2013-12-30 MED ORDER — HYDRALAZINE HCL 25 MG PO TABS
25.0000 mg | ORAL_TABLET | Freq: Three times a day (TID) | ORAL | Status: DC
  Administered 2013-12-30 – 2013-12-31 (×3): 25 mg via ORAL
  Filled 2013-12-30 (×3): qty 1

## 2013-12-30 MED ORDER — ALBUTEROL SULFATE (2.5 MG/3ML) 0.083% IN NEBU
2.5000 mg | INHALATION_SOLUTION | Freq: Once | RESPIRATORY_TRACT | Status: AC
  Administered 2013-12-30: 2.5 mg via RESPIRATORY_TRACT

## 2013-12-30 NOTE — Progress Notes (Signed)
Pt BP 175/103, checked manually 180/110, labetalol 10 mg IV given see MAR, will continue to monitor closely

## 2013-12-30 NOTE — Consult Note (Signed)
[address]       [place] [address]             [phone]      Cardiothoracic Surgery Consult  Reason for Consult: Severe multi-vessel coronary disease Referring Physician: Dr. Lauree Chandler  Seth Keller is an 48 y.o. male.  HPI:   The patient has a history of hypertension for about 15 years, smoking and a family history of coronary disease and has been noncompliant with his medications and not taking any regularly. He says that he changed his diet and thought that would take care of his BP. Two days prior to admission he had an episode of chest pain that lasted about one minute and resolved.Then the next day he had sudden onset of shortness of breath. He took some aspirin and Advil and the symptoms improved so that he could sleep. Then the morning of admission he developed recurrent shortness of breath after awakening and he went to an urgent care. He was transferred to [facility] and was noted to be severely hypertensive and had positive troponins peaking at 2.98. He has had no further symptoms since admission. Cath yesterday shows a large wrap-around LAD with a proximal eccentric 70% stenosis and a mid eccentric 60-70% stenosis. The FFR was 0.78. The apical LAD has a 90% segmental stenosis near the apex. The LCX is severely and diffusely diseased with total occlusion of the OM1. The distal vessel is diffusely diseased with 85% stenosis before the OM2 and PDA. The Ramus has diffuse proximal to mid 80% stenosis. The RCA is nondominant and totally occluded.  Past Medical History  Diagnosis Date  . Hypertension approx. 2000    Past Surgical History  Procedure Laterality Date  . None      History reviewed. No pertinent family history.  Social History:  reports that he quit smoking 6 days ago. His smoking use included Cigarettes. He has a 10 pack-year smoking history. He has never used smokeless tobacco. He reports that he does not drink alcohol or use  illicit drugs.  Allergies:  Allergies  Allergen Reactions  . Shellfish Allergy Anaphylaxis    Throat close up, swelling    Medications:  I have reviewed the patient's current medications. Prior to Admission:  Prescriptions prior to admission  Medication Sig Dispense Refill Last Dose  . ibuprofen (ADVIL,MOTRIN) 200 MG tablet Take 400 mg by mouth 2 (two) times daily as needed for mild pain or moderate pain.   Past Week at Unknown time   Scheduled: . amLODipine  10 mg Oral Daily  . aspirin EC  81 mg Oral Daily  . atorvastatin  40 mg Oral q1800  . carvedilol  12.5 mg Oral BID WC  . furosemide  40 mg Oral Daily  . hydrALAZINE  25 mg Oral 3 times per day   Continuous: . heparin 1,700 Units/hr (12/30/13 1919)   BEM:[id number], ALPRAZolam, labetalol, nitroGLYCERIN, ondansetron (ZOFRAN) IV, oxyCODONE-acetaminophen, zolpidem  Results for orders placed or performed during the hospital encounter of 12/26/13 (from the past 48 hour(s))  Heparin level (unfractionated)     Status: None   Collection Time: 12/29/13  3:36 AM  Result Value Ref Range   Heparin Unfractionated 0.54 0.30 - 0.70 IU/mL    Comment:        IF HEPARIN RESULTS ARE BELOW EXPECTED VALUES, AND PATIENT DOSAGE HAS BEEN CONFIRMED, SUGGEST FOLLOW UP TESTING OF ANTITHROMBIN III LEVELS.   CBC  Status: Abnormal   Collection Time: 12/29/13  3:36 AM  Result Value Ref Range   WBC 8.5 4.0 - 10.5 K/uL   RBC 4.71 4.22 - 5.81 MIL/uL   Hemoglobin 12.7 (L) 13.0 - 17.0 g/dL   HCT 38.9 (L) 39.0 - 52.0 %   MCV 82.6 78.0 - 100.0 fL   MCH 27.0 26.0 - 34.0 pg   MCHC 32.6 30.0 - 36.0 g/dL   RDW 13.6 11.5 - 15.5 %   Platelets 250 150 - 400 K/uL  Basic metabolic panel     Status: Abnormal   Collection Time: 12/29/13  3:36 AM  Result Value Ref Range   Sodium 141 137 - 147 mEq/L   Potassium 4.0 3.7 - 5.3 mEq/L   Chloride 102 96 - 112 mEq/L   CO2 26 19 - 32 mEq/L   Glucose, Bld 92 70 - 99 mg/dL   BUN 19 6 - 23 mg/dL    Creatinine, Ser 1.78 (H) 0.50 - 1.35 mg/dL   Calcium 8.8 8.4 - 10.5 mg/dL   GFR calc non Af Amer 43 (L) >90 mL/min   GFR calc Af Amer 50 (L) >90 mL/min    Comment: (NOTE) The eGFR has been calculated using the CKD EPI equation. This calculation has not been validated in all clinical situations. eGFR's persistently <90 mL/min signify possible Chronic Kidney Disease.    Anion gap 13 5 - 15  POCT Activated clotting time     Status: None   Collection Time: 12/29/13  1:40 PM  Result Value Ref Range   Activated Clotting Time 720 seconds  Basic metabolic panel     Status: Abnormal   Collection Time: 12/30/13  4:56 AM  Result Value Ref Range   Sodium 141 137 - 147 mEq/L   Potassium 4.5 3.7 - 5.3 mEq/L   Chloride 103 96 - 112 mEq/L   CO2 23 19 - 32 mEq/L   Glucose, Bld 155 (H) 70 - 99 mg/dL   BUN 26 (H) 6 - 23 mg/dL   Creatinine, Ser 1.91 (H) 0.50 - 1.35 mg/dL   Calcium 9.6 8.4 - 10.5 mg/dL   GFR calc non Af Amer 40 (L) >90 mL/min   GFR calc Af Amer 46 (L) >90 mL/min    Comment: (NOTE) The eGFR has been calculated using the CKD EPI equation. This calculation has not been validated in all clinical situations. eGFR's persistently <90 mL/min signify possible Chronic Kidney Disease.    Anion gap 15 5 - 15  Heparin level (unfractionated)     Status: None   Collection Time: 12/30/13  4:56 AM  Result Value Ref Range   Heparin Unfractionated 0.46 0.30 - 0.70 IU/mL    Comment:        IF HEPARIN RESULTS ARE BELOW EXPECTED VALUES, AND PATIENT DOSAGE HAS BEEN CONFIRMED, SUGGEST FOLLOW UP TESTING OF ANTITHROMBIN III LEVELS.   CBC     Status: Abnormal   Collection Time: 12/30/13  4:56 AM  Result Value Ref Range   WBC 15.6 (H) 4.0 - 10.5 K/uL   RBC 4.94 4.22 - 5.81 MIL/uL   Hemoglobin 13.5 13.0 - 17.0 g/dL   HCT 40.5 39.0 - 52.0 %   MCV 82.0 78.0 - 100.0 fL   MCH 27.3 26.0 - 34.0 pg   MCHC 33.3 30.0 - 36.0 g/dL   RDW 13.6 11.5 - 15.5 %   Platelets 295 150 - 400 K/uL  Heparin level  (unfractionated)     Status:  None   Collection Time: 12/30/13 11:49 AM  Result Value Ref Range   Heparin Unfractionated 0.58 0.30 - 0.70 IU/mL    Comment:        IF HEPARIN RESULTS ARE BELOW EXPECTED VALUES, AND PATIENT DOSAGE HAS BEEN CONFIRMED, SUGGEST FOLLOW UP TESTING OF ANTITHROMBIN III LEVELS.     No results found.  Review of Systems  Constitutional: Negative for fever, chills, weight loss, malaise/fatigue and diaphoresis.  HENT: Negative.   Eyes: Negative.   Respiratory: Positive for shortness of breath.   Cardiovascular: Positive for chest pain. Negative for orthopnea, leg swelling and PND.  Gastrointestinal: Negative.   Genitourinary: Negative.   Musculoskeletal: Negative.   Skin: Negative.   Neurological: Negative.   Endo/Heme/Allergies: Negative.   Psychiatric/Behavioral: Negative.    Blood pressure 152/95, pulse 75, temperature 97.7 F (36.5 C), temperature source Oral, resp. rate 19, height 5' 9" (1.753 m), weight 102.422 kg (225 lb 12.8 oz), SpO2 100 %. Physical Exam  Constitutional: He is oriented to person, place, and time. He appears well-developed and well-nourished. No distress.  HENT:  Head: Normocephalic and atraumatic.  Mouth/Throat: Oropharynx is clear and moist.  Eyes: EOM are normal. Pupils are equal, round, and reactive to light.  Neck: Normal range of motion. Neck supple. No JVD present. No thyromegaly present.  Cardiovascular: Normal rate, regular rhythm, normal heart sounds and intact distal pulses.   No murmur heard. Respiratory: Effort normal and breath sounds normal. No respiratory distress. He has no rales.  GI: Soft. Bowel sounds are normal. He exhibits no distension and no mass. There is no tenderness.  Musculoskeletal: Normal range of motion. He exhibits no edema.  Lymphadenopathy:    He has no cervical adenopathy.  Neurological: He is alert and oriented to person, place, and time. He has normal strength. No cranial nerve deficit or  sensory deficit.  Skin: Skin is warm and dry.  Psychiatric: He has a normal mood and affect.    Left Heart Catheterization with Coronary Angiography Report  Marton Newton  48 y.o.  male Feb 24, 1965  Procedure Date: 12/29/2013 Referring Physician: Darlina Guys, MD Primary Cardiologist: Angelena Form  INDICATIONS: Acute coronary syndrome, severe hypertension, left ventricular dysfunction  PROCEDURE: 1. Left heart catheterization; 2. Left hemodynamic recordings; 3.Coronary angiography  CONSENT:  The risks, benefits, and details of the procedure were explained in detail to the patient. Risks including death, stroke, heart attack, kidney injury, allergy, limb ischemia, bleeding and radiation injury were discussed. The patient verbalized understanding and wanted to proceed. Informed written consent was obtained.  PROCEDURE TECHNIQUE: After Xylocaine anesthesia a 5 French Slender sheath was placed in the right radial artery with an angiocath and the modified Seldinger technique. Coronary angiography was done using a 5 F JR4, JL 3.5 cm catheter. Left ventriculography was done using the JR4 catheter and hand injection.   The angiograms demonstrated three-vessel coronary disease. Of the vessels involved the disease in the proximal to mid LAD appeared moderate. For that reason FFR was performed at a point in the mid LAD beyond the proximal and mid disease noted on angiography. Adenosine was infused. FFR was 0.78.  The procedure was terminated and hemostasis achieved with a wrist band.  CONTRAST: Total of 70 cc.  COMPLICATIONS: Dyspnea with the Dennison   HEMODYNAMICS: Aortic pressure 178/107 mmHg; LV pressure 180/24 mmHg; LVEDP 37 mmHg  ANGIOGRAPHIC DATA: The left main coronary artery is widely patent.  The left anterior descending artery is large and wraps around the left ventricular  apex. There is a proximal eccentric 70% stenosis and a mid eccentric 60-70% stenosis. FFR beyond the  mid vessel stenosis was 0.78. The apical LAD contains 90% segmental stenosis before the apical segment. The first diagonal is totally occluded.  The left circumflex artery is severely and diffusely diseased. The mid circumflex contains an eccentric 80% stenosis. There is total occlusion of a moderate sized first obtuse marginal that arises beyond an 80% stenosis in the mid vessel. The distal vessel is diffusely diseased with up to 85% stenosis before the margin of the second obtuse marginal and the PDA.Marland Kitchen  The ramus intermedius is moderate in size and contains proximal to mid diffuse disease with up to 80% stenosis. The distal vessel may not be graftable.  The right coronary artery is nondominant. The right coronary is totally occluded in the mid-segment.   LEFT VENTRICULOGRAM: Left ventricular angiogram was not done. Pressure recordings revealed markedly elevated LVEDP.   IMPRESSIONS: 1. Significant three-vessel coronary artery disease with hemodynamically significant proximal mid LAD stenosis by FFR, significant mid ramus intermedius obstruction, significant circumflex and obtuse marginal disease, and nondominant totally occluded RCA.  2. Chronic kidney disease, stage III  3. Decreased LV function with inferior wall motion abnormality and estimated EF of 45% by echo. Markedly elevated LVEDP   RECOMMENDATION: Aggressive medical therapy to control blood pressure.  IV hydration.  Monitor renal function.  Consider surgical revascularization versus LAD proximal and mid stenting as well as mid circumflex stenting. Heart team approach should probably be used before making final decision.   Assessment/Plan:  He has severe multi-vessel coronary disease with mild LV dysfunction and a markedly elevated LVEDP presenting with a NSTEMI. He also has stage III chronic kidney disease with a creatinine of 1.36 in 05/2012 and 1.73 on admission now. It is 1.91 today after cath yesterday. He has poorly  controlled hypertension which is the likely cause of his renal dysfunction. I agree that CABG is the best long term treatment for him given his 3-vessel disease and diffusely diseased LCX. I discussed the operative procedure with the patient including alternatives, benefits and risks; including but not limited to bleeding, blood transfusion, infection, stroke, myocardial infarction, graft failure, heart block requiring a permanent pacemaker, organ dysfunction, and death.  Bertrand Godar understands and agrees to proceed.  We will schedule surgery for Friday as long as his creatinine remains stable.  BARTLE,BRYAN K 12/30/2013, 9:40 PM

## 2013-12-30 NOTE — Progress Notes (Signed)
ANTICOAGULATION CONSULT NOTE - Follow Up Consult  Pharmacy Consult for Heparin  Indication: chest pain/ACS, s/p cath with multi-vessel disease  Allergies  Allergen Reactions  . Shellfish Allergy Anaphylaxis    Throat close up, swelling    Patient Measurements: Height: 5\' 9"  (175.3 cm) Weight: 225 lb 12.8 oz (102.422 kg) IBW/kg (Calculated) : 70.7  Vital Signs: Temp: 97.9 F (36.6 C) (11/11 1300) Temp Source: Oral (11/11 1300) BP: 180/110 mmHg (11/11 1250) Pulse Rate: 80 (11/11 1300)  Labs:  Recent Labs  12/28/13 0305  12/29/13 0336 12/30/13 0456 12/30/13 1149  HGB 12.8*  --  12.7* 13.5  --   HCT 39.2  --  38.9* 40.5  --   PLT 252  --  250 295  --   HEPARINUNFRC 0.24*  < > 0.54 0.46 0.58  CREATININE 1.92*  --  1.78* 1.91*  --   < > = values in this interval not displayed.  Estimated Creatinine Clearance: 55.8 mL/min (by C-G formula based on Cr of 1.91).   Assessment: 48 yo male on heparin for NSTEMI and s/p cath with 3V CAD and for possible CABG. Heparin level is at goal on 1700 units/hr.   Goal of Therapy:  Heparin level 0.3-0.7 units/ml Monitor platelets by anticoagulation protocol: Yes   Plan:  -Continue heparin at 1700 units/hr -Daily CBC/HL -Will follow CABG plans  Hildred Laser, Pharm D 12/30/2013 2:40 PM

## 2013-12-30 NOTE — Progress Notes (Signed)
ANTICOAGULATION CONSULT NOTE - Follow Up Consult  Pharmacy Consult for Heparin  Indication: chest pain/ACS, s/p cath with multi-vessel disease  Allergies  Allergen Reactions  . Shellfish Allergy Anaphylaxis    Throat close up, swelling    Patient Measurements: Height: 5\' 9"  (175.3 cm) Weight: 228 lb 1.6 oz (103.465 kg) IBW/kg (Calculated) : 70.7  Vital Signs: Temp: 98.6 F (37 C) (11/11 0400) Temp Source: Oral (11/11 0400) BP: 188/93 mmHg (11/11 0400) Pulse Rate: 83 (11/11 0400)  Labs:  Recent Labs  12/28/13 0305 12/28/13 1203 12/29/13 0336 12/30/13 0456  HGB 12.8*  --  12.7* 13.5  HCT 39.2  --  38.9* 40.5  PLT 252  --  250 295  HEPARINUNFRC 0.24* 0.47 0.54 0.46  CREATININE 1.92*  --  1.78*  --     Estimated Creatinine Clearance: 60.2 mL/min (by C-G formula based on Cr of 1.78).   Assessment: S/p cath, first HL after heparin re-start is therapeutic  Goal of Therapy:  Heparin level 0.3-0.7 units/ml Monitor platelets by anticoagulation protocol: Yes   Plan:  -Continue heparin at 1700 units/hr -1200 HL to confirm -Daily CBC/HL -Monitor for bleeding -F/U MD plans for management of multi-vessel disease  Narda Bonds 12/30/2013,5:50 AM

## 2013-12-30 NOTE — Progress Notes (Signed)
SUBJECTIVE: NO chest pain or SOB this am.   BP 175/93 mmHg  Pulse 72  Temp(Src) 97.9 F (36.6 C) (Oral)  Resp 20  Ht 5\' 9"  (1.753 m)  Wt 225 lb 12.8 oz (102.422 kg)  BMI 33.33 kg/m2  SpO2 99%  Intake/Output Summary (Last 24 hours) at 12/30/13 1011 Last data filed at 12/30/13 0700  Gross per 24 hour  Intake 1448.5 ml  Output   1300 ml  Net  148.5 ml    PHYSICAL EXAM General: Well developed, well nourished, in no acute distress. Alert and oriented x 3.  Psych:  Good affect, responds appropriately Neck: No JVD. No masses noted.  Lungs: Clear bilaterally with no wheezes or rhonci noted.  Heart: RRR with no murmurs noted. Abdomen: Bowel sounds are present. Soft, non-tender.  Extremities: No lower extremity edema.   LABS: Basic Metabolic Panel:  Recent Labs  12/29/13 0336 12/30/13 0456  NA 141 141  K 4.0 4.5  CL 102 103  CO2 26 23  GLUCOSE 92 155*  BUN 19 26*  CREATININE 1.78* 1.91*  CALCIUM 8.8 9.6   CBC:  Recent Labs  12/29/13 0336 12/30/13 0456  WBC 8.5 15.6*  HGB 12.7* 13.5  HCT 38.9* 40.5  MCV 82.6 82.0  PLT 250 295   Current Meds: . amLODipine  10 mg Oral Daily  . aspirin EC  81 mg Oral Daily  . atorvastatin  40 mg Oral q1800  . carvedilol  12.5 mg Oral BID WC   Cardiac cath 12/29/13: The left main coronary artery is widely patent.  The left anterior descending artery is large and wraps around the left ventricular apex. There is a proximal eccentric 70% stenosis and a mid eccentric 60-70% stenosis. FFR beyond the mid vessel stenosis was 0.78. The apical LAD contains 90% segmental stenosis before the apical segment. The first diagonal is totally occluded.  The left circumflex artery is severely and diffusely diseased. The mid circumflex contains an eccentric 80% stenosis. There is total occlusion of a moderate sized first obtuse marginal that arises beyond the 80% stenosis in the mid vessel. The distal vessel is diffusely diseased with up to  85% stenosis before the margin of the second obtuse marginal and the PDA.Marland Kitchen  The ramus intermedius is moderate in size and contains some proximal to mid diffuse disease with up to 80% stenosis. The distal vessel may not be graftable.  The right coronary artery is nondominant. The right coronary is totally occluded in the mid-segment.   Echo 12/26/13: Left ventricle: The cavity size was mildly dilated. Wall thickness was increased in a pattern of mild LVH. Systolic function was mildly reduced. The estimated ejection fraction was 45%. There is akinesis of the basal-midinferolateral and inferior myocardium. Features are consistent with a pseudonormal left ventricular filling pattern, with concomitant abnormal relaxation and increased filling pressure (grade 2 diastolic dysfunction). Doppler parameters are consistent with elevated ventricular end-diastolic filling pressure. - Aortic valve: Mildly calcified annulus. Trileaflet. There was no regurgitation. - Mitral valve: There was mild regurgitation directed eccentrically. - Left atrium: The atrium was moderately dilated. - Right atrium: The atrium was mildly dilated. Central venous pressure (est): 3 mm Hg. - Tricuspid valve: There was trivial regurgitation. - Pulmonary arteries: Systolic pressure could not be accurately estimated. - Pericardium, extracardiac: There was no pericardial effusion.   ASSESSMENT AND PLAN:  1. NSTEMI/CAD: Pt admitted with elevated troponin, chest pain in the setting of hypertensive urgency. Echocardiogram shows LVEF 45%  with inferolateral wall motion abnormality. Cardiac cath 12/29/13 per Dr. Tamala Julian with severe multi-vessel CAD. CT surgery has been consulted to see him today. Tentative plans for OR on Friday. He is having no chest pain this am. Continue ASA, statin, beta blocker, heparin drip.   2. Essential hypertension/Hypertensive urgency: BP is still elevated. Will add hydralazine.   3.  Hyperlipidemia: Continue statin.   4. Acute on chronic renal insufficiency: Renal function is overall stable post-cath. Will need to follow closely with diuresis.   5. Acute systolic CHF: Filling pressures elevated yesterday. Will attempt diuresis today. Follow renal function closely.   6. Ischemic cardiomyopathy: Continue beta blocker. No ace-inh/ARB with renal insufficiency. Will add hydralazine for afterload reduction.    MCALHANY,CHRISTOPHER  11/11/201510:11 AM

## 2013-12-30 NOTE — Progress Notes (Signed)
CARDIAC REHAB PHASE I   PRE:  Rate/Rhythm: 74 SR  BP:  Supine:   Sitting: 182/90  Standing:    SaO2:   MODE:  Ambulation: 550 ft   POST:  Rate/Rhythm:   BP:  Supine:   Sitting: 182100  Standing:    SaO2: 99 RA 1125-1215 Pt tolerated ambulation well without c/o of cp or SOB. BP elevated before and after walk. Pt to side of bed after walk. Completed pre-op education with pt and girlfriend. I gave them surgery education booklet and pt care guide. Answered their questions. We discussed sternal precautions, use of IS and walking post-op. We practiced getting in and out of chair and bed without using arms. Pt has IS and has been using it.  Rodney Langton RN 12/30/2013 12:26 PM

## 2013-12-31 ENCOUNTER — Inpatient Hospital Stay (HOSPITAL_COMMUNITY): Payer: Medicaid Other

## 2013-12-31 DIAGNOSIS — Z01810 Encounter for preprocedural cardiovascular examination: Secondary | ICD-10-CM

## 2013-12-31 LAB — CBC WITH DIFFERENTIAL/PLATELET
Basophils Absolute: 0 10*3/uL (ref 0.0–0.1)
Basophils Relative: 0 % (ref 0–1)
EOS ABS: 0.3 10*3/uL (ref 0.0–0.7)
Eosinophils Relative: 2 % (ref 0–5)
HCT: 39.5 % (ref 39.0–52.0)
HEMOGLOBIN: 13.1 g/dL (ref 13.0–17.0)
LYMPHS PCT: 23 % (ref 12–46)
Lymphs Abs: 3.5 10*3/uL (ref 0.7–4.0)
MCH: 27.4 pg (ref 26.0–34.0)
MCHC: 33.2 g/dL (ref 30.0–36.0)
MCV: 82.6 fL (ref 78.0–100.0)
MONOS PCT: 7 % (ref 3–12)
Monocytes Absolute: 1.1 10*3/uL — ABNORMAL HIGH (ref 0.1–1.0)
NEUTROS ABS: 10.4 10*3/uL — AB (ref 1.7–7.7)
NEUTROS PCT: 68 % (ref 43–77)
Platelets: 301 10*3/uL (ref 150–400)
RBC: 4.78 MIL/uL (ref 4.22–5.81)
RDW: 13.9 % (ref 11.5–15.5)
WBC: 15.3 10*3/uL — AB (ref 4.0–10.5)

## 2013-12-31 LAB — BASIC METABOLIC PANEL
ANION GAP: 15 (ref 5–15)
BUN: 25 mg/dL — ABNORMAL HIGH (ref 6–23)
CHLORIDE: 104 meq/L (ref 96–112)
CO2: 23 mEq/L (ref 19–32)
Calcium: 9 mg/dL (ref 8.4–10.5)
Creatinine, Ser: 1.87 mg/dL — ABNORMAL HIGH (ref 0.50–1.35)
GFR, EST AFRICAN AMERICAN: 47 mL/min — AB (ref >90)
GFR, EST NON AFRICAN AMERICAN: 41 mL/min — AB (ref >90)
Glucose, Bld: 111 mg/dL — ABNORMAL HIGH (ref 70–99)
POTASSIUM: 3.9 meq/L (ref 3.7–5.3)
Sodium: 142 mEq/L (ref 137–147)

## 2013-12-31 LAB — TYPE AND SCREEN
ABO/RH(D): O POS
Antibody Screen: NEGATIVE

## 2013-12-31 LAB — ABO/RH: ABO/RH(D): O POS

## 2013-12-31 LAB — HEPARIN LEVEL (UNFRACTIONATED): HEPARIN UNFRACTIONATED: 0.57 [IU]/mL (ref 0.30–0.70)

## 2013-12-31 MED ORDER — CHLORHEXIDINE GLUCONATE CLOTH 2 % EX PADS
6.0000 | MEDICATED_PAD | Freq: Once | CUTANEOUS | Status: AC
  Administered 2014-01-01: 6 via TOPICAL

## 2013-12-31 MED ORDER — CHLORHEXIDINE GLUCONATE CLOTH 2 % EX PADS
6.0000 | MEDICATED_PAD | Freq: Once | CUTANEOUS | Status: AC
  Administered 2013-12-31: 6 via TOPICAL

## 2013-12-31 MED ORDER — CEFUROXIME SODIUM 750 MG IJ SOLR
750.0000 mg | INTRAMUSCULAR | Status: DC
  Filled 2013-12-31: qty 750

## 2013-12-31 MED ORDER — DEXTROSE 5 % IV SOLN
30.0000 ug/min | INTRAVENOUS | Status: DC
  Filled 2013-12-31: qty 2

## 2013-12-31 MED ORDER — TEMAZEPAM 15 MG PO CAPS
15.0000 mg | ORAL_CAPSULE | Freq: Once | ORAL | Status: AC | PRN
  Filled 2013-12-31: qty 1

## 2013-12-31 MED ORDER — DOPAMINE-DEXTROSE 3.2-5 MG/ML-% IV SOLN
0.0000 ug/kg/min | INTRAVENOUS | Status: AC
  Administered 2014-01-01: 2 ug/min via INTRAVENOUS
  Filled 2013-12-31: qty 250

## 2013-12-31 MED ORDER — DEXTROSE 5 % IV SOLN
0.0000 ug/min | INTRAVENOUS | Status: DC
  Filled 2013-12-31: qty 4

## 2013-12-31 MED ORDER — NITROGLYCERIN IN D5W 200-5 MCG/ML-% IV SOLN
2.0000 ug/min | INTRAVENOUS | Status: DC
  Filled 2013-12-31: qty 250

## 2013-12-31 MED ORDER — HYDRALAZINE HCL 50 MG PO TABS
50.0000 mg | ORAL_TABLET | Freq: Three times a day (TID) | ORAL | Status: DC
  Administered 2013-12-31 (×2): 50 mg via ORAL
  Filled 2013-12-31 (×2): qty 1

## 2013-12-31 MED ORDER — POTASSIUM CHLORIDE 2 MEQ/ML IV SOLN
80.0000 meq | INTRAVENOUS | Status: DC
Start: 2014-01-01 — End: 2014-01-01
  Filled 2013-12-31: qty 40

## 2013-12-31 MED ORDER — DEXMEDETOMIDINE HCL IN NACL 400 MCG/100ML IV SOLN
0.1000 ug/kg/h | INTRAVENOUS | Status: DC
  Filled 2013-12-31: qty 100

## 2013-12-31 MED ORDER — DEXTROSE 5 % IV SOLN
1.5000 g | INTRAVENOUS | Status: AC
  Administered 2014-01-01: 1.5 g via INTRAVENOUS
  Administered 2014-01-01: .75 g via INTRAVENOUS
  Filled 2013-12-31: qty 1.5

## 2013-12-31 MED ORDER — BISACODYL 5 MG PO TBEC
5.0000 mg | DELAYED_RELEASE_TABLET | Freq: Once | ORAL | Status: AC
  Administered 2013-12-31: 5 mg via ORAL
  Filled 2013-12-31: qty 1

## 2013-12-31 MED ORDER — SODIUM CHLORIDE 0.9 % IV SOLN
1500.0000 mg | INTRAVENOUS | Status: AC
  Administered 2014-01-01: 1500 mg via INTRAVENOUS
  Filled 2013-12-31 (×2): qty 1500

## 2013-12-31 MED ORDER — PLASMA-LYTE 148 IV SOLN
INTRAVENOUS | Status: AC
  Administered 2014-01-01: 10:00:00
  Filled 2013-12-31: qty 2.5

## 2013-12-31 MED ORDER — MAGNESIUM SULFATE 50 % IJ SOLN
40.0000 meq | INTRAMUSCULAR | Status: DC
  Filled 2013-12-31: qty 10

## 2013-12-31 MED ORDER — ALPRAZOLAM 0.25 MG PO TABS: 0.2500 mg | ORAL_TABLET | ORAL | Status: DC | PRN

## 2013-12-31 MED ORDER — DIAZEPAM 5 MG PO TABS
10.0000 mg | ORAL_TABLET | Freq: Once | ORAL | Status: AC
  Administered 2014-01-01: 10 mg via ORAL
  Filled 2013-12-31: qty 2

## 2013-12-31 MED ORDER — HEPARIN SODIUM (PORCINE) 1000 UNIT/ML IJ SOLN
INTRAMUSCULAR | Status: DC
  Filled 2013-12-31: qty 30

## 2013-12-31 MED ORDER — SODIUM CHLORIDE 0.9 % IV SOLN
INTRAVENOUS | Status: DC
  Filled 2013-12-31: qty 40

## 2013-12-31 MED ORDER — METOPROLOL TARTRATE 12.5 MG HALF TABLET
12.5000 mg | ORAL_TABLET | Freq: Once | ORAL | Status: AC
  Administered 2014-01-01: 12.5 mg via ORAL
  Filled 2013-12-31: qty 1

## 2013-12-31 MED ORDER — SODIUM CHLORIDE 0.9 % IV SOLN
INTRAVENOUS | Status: DC
  Filled 2013-12-31: qty 2.5

## 2013-12-31 NOTE — Progress Notes (Signed)
SUBJECTIVE: No chest pain or SOB. No events.   BP 156/103 mmHg  Pulse 80  Temp(Src) 98.5 F (36.9 C) (Oral)  Resp 19  Ht 5\' 9"  (1.753 m)  Wt 225 lb 9.6 oz (102.331 kg)  BMI 33.30 kg/m2  SpO2 97%  Intake/Output Summary (Last 24 hours) at 12/31/13 0959 Last data filed at 12/31/13 0845  Gross per 24 hour  Intake 1363.38 ml  Output   1475 ml  Net -111.62 ml    PHYSICAL EXAM General: Well developed, well nourished, in no acute distress. Alert and oriented x 3.  Psych:  Good affect, responds appropriately Neck: No JVD. No masses noted.  Lungs: Clear bilaterally with no wheezes or rhonci noted.  Heart: RRR with no murmurs noted. Abdomen: Bowel sounds are present. Soft, non-tender.  Extremities: No lower extremity edema.   LABS: Basic Metabolic Panel:  Recent Labs  12/30/13 0456 12/31/13 0315  NA 141 142  K 4.5 3.9  CL 103 104  CO2 23 23  GLUCOSE 155* 111*  BUN 26* 25*  CREATININE 1.91* 1.87*  CALCIUM 9.6 9.0   CBC:  Recent Labs  12/30/13 0456 12/31/13 0315  WBC 15.6* 15.3*  NEUTROABS  --  10.4*  HGB 13.5 13.1  HCT 40.5 39.5  MCV 82.0 82.6  PLT 295 301   Current Meds: . amLODipine  10 mg Oral Daily  . aspirin EC  81 mg Oral Daily  . atorvastatin  40 mg Oral q1800  . carvedilol  12.5 mg Oral BID WC  . furosemide  40 mg Oral Daily  . hydrALAZINE  25 mg Oral 3 times per day   Cardiac cath 12/29/13: The left main coronary artery is widely patent.  The left anterior descending artery is large and wraps around the left ventricular apex. There is a proximal eccentric 70% stenosis and a mid eccentric 60-70% stenosis. FFR beyond the mid vessel stenosis was 0.78. The apical LAD contains 90% segmental stenosis before the apical segment. The first diagonal is totally occluded.  The left circumflex artery is severely and diffusely diseased. The mid circumflex contains an eccentric 80% stenosis. There is total occlusion of a moderate sized first obtuse  marginal that arises beyond the 80% stenosis in the mid vessel. The distal vessel is diffusely diseased with up to 85% stenosis before the margin of the second obtuse marginal and the PDA.Marland Kitchen  The ramus intermedius is moderate in size and contains some proximal to mid diffuse disease with up to 80% stenosis. The distal vessel may not be graftable.  The right coronary artery is nondominant. The right coronary is totally occluded in the mid-segment.   Echo 12/26/13: Left ventricle: The cavity size was mildly dilated. Wall thickness was increased in a pattern of mild LVH. Systolic function was mildly reduced. The estimated ejection fraction was 45%. There is akinesis of the basal-midinferolateral and inferior myocardium. Features are consistent with a pseudonormal left ventricular filling pattern, with concomitant abnormal relaxation and increased filling pressure (grade 2 diastolic dysfunction). Doppler parameters are consistent with elevated ventricular end-diastolic filling pressure. - Aortic valve: Mildly calcified annulus. Trileaflet. There was no regurgitation. - Mitral valve: There was mild regurgitation directed eccentrically. - Left atrium: The atrium was moderately dilated. - Right atrium: The atrium was mildly dilated. Central venous pressure (est): 3 mm Hg. - Tricuspid valve: There was trivial regurgitation. - Pulmonary arteries: Systolic pressure could not be accurately estimated. - Pericardium, extracardiac: There was no pericardial effusion.  ASSESSMENT AND PLAN:  1. NSTEMI/CAD: Pt admitted with elevated troponin, chest pain in the setting of hypertensive urgency. Echocardiogram shows LVEF 45% with inferolateral wall motion abnormality. Cardiac cath 12/29/13 per Dr. Tamala Julian with severe multi-vessel CAD. He has been seen by Dr. Cyndia Bent with CT surgery and there are tentative plans for CABG tomorrow 01/01/14. on Friday. He is having no chest pain this am.  Continue ASA, statin, beta blocker, heparin drip.   2. Essential hypertension/Hypertensive urgency: BP is still elevated. Will increase hydralazine to 50 mg po TID  3. Hyperlipidemia: Continue statin.   4. Acute on chronic renal insufficiency: Renal function is stable this am. Repeat BMET in am  5. Acute systolic CHF: Filling pressures elevated during cath. Continue Lasix. Follow renal function closely.   6. Ischemic cardiomyopathy: Continue beta blocker. No ace-inh/ARB with renal insufficiency. Will continue hydralazine for afterload reduction.     Lynzee Lindquist  11/12/20159:59 AM

## 2013-12-31 NOTE — Progress Notes (Signed)
CARDIAC REHAB PHASE I   PRE:  Rate/Rhythm: 71 SR    BP: sitting 162/100    SaO2:   MODE:  Ambulation: 830 ft   POST:  Rate/Rhythm: 88 SR    BP: sitting 192/109     SaO2:   Tolerated well. BP elevated. Pt voices his concerns/fears. Reassured pt.  [id number]   Josephina Shih Hollenberg CES, ACSM 12/31/2013 2:40 PM

## 2013-12-31 NOTE — Progress Notes (Signed)
ANTICOAGULATION CONSULT NOTE - Follow Up Consult  Pharmacy Consult for Heparin  Indication: chest pain/ACS, s/p cath with multi-vessel disease  Allergies  Allergen Reactions  . Shellfish Allergy Anaphylaxis    Throat close up, swelling    Patient Measurements: Height: 5\' 9"  (175.3 cm) Weight: 225 lb 9.6 oz (102.331 kg) IBW/kg (Calculated) : 70.7  Vital Signs: Temp: 98.5 F (36.9 C) (11/12 0752) Temp Source: Oral (11/12 0752) BP: 156/103 mmHg (11/12 0752) Pulse Rate: 80 (11/12 0752)  Labs:  Recent Labs  12/29/13 0336 12/30/13 0456 12/30/13 1149 12/31/13 0315  HGB 12.7* 13.5  --  13.1  HCT 38.9* 40.5  --  39.5  PLT 250 295  --  301  HEPARINUNFRC 0.54 0.46 0.58 0.57  CREATININE 1.78* 1.91*  --  1.87*    Estimated Creatinine Clearance: 56.9 mL/min (by C-G formula based on Cr of 1.87).   Assessment: 48 yo male on heparin for NSTEMI and s/p cath with 3V CAD and for CABG tomorrow. Heparin level is at goal on 1700 units/hr.   Goal of Therapy:  Heparin level 0.3-0.7 units/ml Monitor platelets by anticoagulation protocol: Yes   Plan:  -Continue heparin at 1700 units/hr -Daily CBC/heparin level -Will follow CABG plans  [employer], Pharm.D., BCPS Clinical Pharmacist Pager [phone] 12/31/2013 9:41 AM

## 2013-12-31 NOTE — Progress Notes (Signed)
Pre-op Cardiac Surgery  Carotid Findings:  Findings suggest 1-39% internal carotid artery stenosis bilaterally. The right vertebral artery is patent with antegrade flow. Unable to visualize the left vertebral artery.  Upper Extremity Right Left  Brachial Pressures 172-Triphasic 167-Triphasic  Radial Waveforms Triphasic Triphasic  Ulnar Waveforms Triphasic Triphasic  Palmar Arch (Allen's Test) Signal obliterates with radial compression, is unaffected with ulnar compression. Signal is unaffected with radial compression, decreases 50% with ulnar compression.   12/31/2013 4:17 PM Maudry Mayhew, RVT, RDCS, RDMS

## 2013-12-31 NOTE — Progress Notes (Signed)
2 Days Post-Op Procedure(s) (LRB): LEFT HEART CATHETERIZATION WITH CORONARY ANGIOGRAM (N/A) Subjective:  No chest pain or shortness of breath  Objective: Vital signs in last 24 hours: Temp:  [97.7 F (36.5 C)-98.5 F (36.9 C)] 98.3 F (36.8 C) (11/12 1135) Pulse Rate:  [75-80] 75 (11/12 1135) Cardiac Rhythm:  [-] Normal sinus rhythm (11/12 0830) Resp:  [14-24] 18 (11/12 1135) BP: (133-168)/(74-105) 165/105 mmHg (11/12 1135) SpO2:  [96 %-100 %] 96 % (11/12 1135) Weight:  [102.331 kg (225 lb 9.6 oz)] 102.331 kg (225 lb 9.6 oz) (11/12 0414)  Hemodynamic parameters for last 24 hours:    Intake/Output from previous day: 11/11 0701 - 11/12 0700 In: 1243.4 [P.O.:480; I.V.:763.4] Out: 1475 [Urine:1475] Intake/Output this shift: Total I/O In: 120 [P.O.:120] Out: 525 [Urine:525]  General appearance: alert and cooperative Heart: regular rate and rhythm, S1, S2 normal, no murmur, click, rub or gallop Lungs: clear to auscultation bilaterally  Lab Results:  Recent Labs  12/30/13 0456 12/31/13 0315  WBC 15.6* 15.3*  HGB 13.5 13.1  HCT 40.5 39.5  PLT 295 301   BMET:  Recent Labs  12/30/13 0456 12/31/13 0315  NA 141 142  K 4.5 3.9  CL 103 104  CO2 23 23  GLUCOSE 155* 111*  BUN 26* 25*  CREATININE 1.91* 1.87*  CALCIUM 9.6 9.0    PT/INR: No results for input(s): LABPROT, INR in the last 72 hours. ABG No results found for: PHART, HCO3, TCO2, ACIDBASEDEF, O2SAT CBG (last 3)  No results for input(s): GLUCAP in the last 72 hours.  Assessment/Plan:  Severe multi-vessel coronary disease  Plan CABG tomorrow. Patient and his finance' have no further questions.  LOS: 5 days    BARTLE,BRYAN K 12/31/2013

## 2013-12-31 NOTE — Progress Notes (Signed)
**Note De-Identified Seth Keller Obfuscation** RT tried 3 times unsuccessfully to obtain ABG.

## 2013-12-31 NOTE — Plan of Care (Signed)
Problem: Consults Goal: Tobacco Cessation referral if indicated Outcome: Not Applicable Date Met:  92/95/74

## 2014-01-01 ENCOUNTER — Inpatient Hospital Stay (HOSPITAL_COMMUNITY): Payer: Medicaid Other

## 2014-01-01 ENCOUNTER — Encounter (HOSPITAL_COMMUNITY)
Admission: EM | Disposition: A | Discharge status: Home / Self Care | Payer: BC Managed Care – PPO | Source: Home / Self Care | Attending: Surgery

## 2014-01-01 ENCOUNTER — Inpatient Hospital Stay (HOSPITAL_COMMUNITY): Payer: Medicaid Other | Admitting: Certified Registered Nurse Anesthetist

## 2014-01-01 DIAGNOSIS — Z951 Presence of aortocoronary bypass graft: Secondary | ICD-10-CM

## 2014-01-01 HISTORY — PX: CORONARY ARTERY BYPASS GRAFT: SHX141

## 2014-01-01 HISTORY — PX: INTRAOPERATIVE TRANSESOPHAGEAL ECHOCARDIOGRAM: SHX5062

## 2014-01-01 LAB — BASIC METABOLIC PANEL
ANION GAP: 15 (ref 5–15)
BUN: 25 mg/dL — ABNORMAL HIGH (ref 6–23)
CO2: 24 mEq/L (ref 19–32)
Calcium: 9.4 mg/dL (ref 8.4–10.5)
Chloride: 102 mEq/L (ref 96–112)
Creatinine, Ser: 1.93 mg/dL — ABNORMAL HIGH (ref 0.50–1.35)
GFR, EST AFRICAN AMERICAN: 46 mL/min — AB (ref >90)
GFR, EST NON AFRICAN AMERICAN: 39 mL/min — AB (ref >90)
Glucose, Bld: 93 mg/dL (ref 70–99)
POTASSIUM: 4 meq/L (ref 3.7–5.3)
SODIUM: 141 meq/L (ref 137–147)

## 2014-01-01 LAB — POCT I-STAT, CHEM 8
BUN: 23 mg/dL (ref 6–23)
BUN: 23 mg/dL (ref 6–23)
BUN: 22 mg/dL (ref 6–23)
BUN: 23 mg/dL (ref 6–23)
BUN: 22 mg/dL (ref 6–23)
BUN: 22 mg/dL (ref 6–23)
CALCIUM ION: 1.14 mmol/L (ref 1.12–1.23)
CHLORIDE: 102 meq/L (ref 96–112)
CHLORIDE: 102 meq/L (ref 96–112)
CREATININE: 1.8 mg/dL — AB (ref 0.50–1.35)
Calcium, Ion: 1.29 mmol/L — ABNORMAL HIGH (ref 1.12–1.23)
Calcium, Ion: 1.21 mmol/L (ref 1.12–1.23)
Calcium, Ion: 1.1 mmol/L — ABNORMAL LOW (ref 1.12–1.23)
Calcium, Ion: 1.13 mmol/L (ref 1.12–1.23)
Calcium, Ion: 1.18 mmol/L (ref 1.12–1.23)
Chloride: 105 mEq/L (ref 96–112)
Chloride: 104 mEq/L (ref 96–112)
Chloride: 102 mEq/L (ref 96–112)
Chloride: 106 mEq/L (ref 96–112)
Creatinine, Ser: 1.7 mg/dL — ABNORMAL HIGH (ref 0.50–1.35)
Creatinine, Ser: 1.8 mg/dL — ABNORMAL HIGH (ref 0.50–1.35)
Creatinine, Ser: 1.9 mg/dL — ABNORMAL HIGH (ref 0.50–1.35)
Creatinine, Ser: 1.9 mg/dL — ABNORMAL HIGH (ref 0.50–1.35)
Creatinine, Ser: 1.9 mg/dL — ABNORMAL HIGH (ref 0.50–1.35)
GLUCOSE: 103 mg/dL — AB (ref 70–99)
GLUCOSE: 107 mg/dL — AB (ref 70–99)
Glucose, Bld: 110 mg/dL — ABNORMAL HIGH (ref 70–99)
Glucose, Bld: 123 mg/dL — ABNORMAL HIGH (ref 70–99)
Glucose, Bld: 122 mg/dL — ABNORMAL HIGH (ref 70–99)
Glucose, Bld: 123 mg/dL — ABNORMAL HIGH (ref 70–99)
HCT: 43 % (ref 39.0–52.0)
HCT: 39 % (ref 39.0–52.0)
HCT: 36 % — ABNORMAL LOW (ref 39.0–52.0)
HEMATOCRIT: 31 % — AB (ref 39.0–52.0)
HEMATOCRIT: 32 % — AB (ref 39.0–52.0)
HEMATOCRIT: 35 % — AB (ref 39.0–52.0)
HEMOGLOBIN: 13.3 g/dL (ref 13.0–17.0)
HEMOGLOBIN: 10.5 g/dL — AB (ref 13.0–17.0)
HEMOGLOBIN: 12.2 g/dL — AB (ref 13.0–17.0)
Hemoglobin: 14.6 g/dL (ref 13.0–17.0)
Hemoglobin: 10.9 g/dL — ABNORMAL LOW (ref 13.0–17.0)
Hemoglobin: 11.9 g/dL — ABNORMAL LOW (ref 13.0–17.0)
POTASSIUM: 4.5 meq/L (ref 3.7–5.3)
POTASSIUM: 5.1 meq/L (ref 3.7–5.3)
Potassium: 4 mEq/L (ref 3.7–5.3)
Potassium: 4.1 mEq/L (ref 3.7–5.3)
Potassium: 4.8 mEq/L (ref 3.7–5.3)
Potassium: 4.9 mEq/L (ref 3.7–5.3)
SODIUM: 137 meq/L (ref 137–147)
SODIUM: 134 meq/L — AB (ref 137–147)
SODIUM: 134 meq/L — AB (ref 137–147)
Sodium: 140 mEq/L (ref 137–147)
Sodium: 134 mEq/L — ABNORMAL LOW (ref 137–147)
Sodium: 138 mEq/L (ref 137–147)
TCO2: 29 mmol/L (ref 0–100)
TCO2: 27 mmol/L (ref 0–100)
TCO2: 23 mmol/L (ref 0–100)
TCO2: 25 mmol/L (ref 0–100)
TCO2: 22 mmol/L (ref 0–100)
TCO2: 22 mmol/L (ref 0–100)

## 2014-01-01 LAB — CBC
HCT: 41.2 % (ref 39.0–52.0)
HCT: 41.8 % (ref 39.0–52.0)
HEMATOCRIT: 36.5 % — AB (ref 39.0–52.0)
HEMATOCRIT: 35 % — AB (ref 39.0–52.0)
HEMOGLOBIN: 13.9 g/dL (ref 13.0–17.0)
HEMOGLOBIN: 11.8 g/dL — AB (ref 13.0–17.0)
HEMOGLOBIN: 14 g/dL (ref 13.0–17.0)
Hemoglobin: 12.1 g/dL — ABNORMAL LOW (ref 13.0–17.0)
MCH: 27.3 pg (ref 26.0–34.0)
MCH: 27.6 pg (ref 26.0–34.0)
MCH: 27.8 pg (ref 26.0–34.0)
MCH: 27.9 pg (ref 26.0–34.0)
MCHC: 33.2 g/dL (ref 30.0–36.0)
MCHC: 33.5 g/dL (ref 30.0–36.0)
MCHC: 33.7 g/dL (ref 30.0–36.0)
MCHC: 33.7 g/dL (ref 30.0–36.0)
MCV: 82.9 fL (ref 78.0–100.0)
MCV: 82.6 fL (ref 78.0–100.0)
MCV: 82.2 fL (ref 78.0–100.0)
MCV: 81.8 fL (ref 78.0–100.0)
PLATELETS: 312 10*3/uL (ref 150–400)
PLATELETS: 202 10*3/uL (ref 150–400)
Platelets: 292 10*3/uL (ref 150–400)
Platelets: 217 10*3/uL (ref 150–400)
RBC: 4.28 MIL/uL (ref 4.22–5.81)
RBC: 4.44 MIL/uL (ref 4.22–5.81)
RBC: 4.99 MIL/uL (ref 4.22–5.81)
RBC: 5.04 MIL/uL (ref 4.22–5.81)
RDW: 13.8 % (ref 11.5–15.5)
RDW: 13.9 % (ref 11.5–15.5)
RDW: 14.1 % (ref 11.5–15.5)
RDW: 14.1 % (ref 11.5–15.5)
WBC: 12.5 10*3/uL — ABNORMAL HIGH (ref 4.0–10.5)
WBC: 13.9 10*3/uL — AB (ref 4.0–10.5)
WBC: 15.4 10*3/uL — AB (ref 4.0–10.5)
WBC: 17.7 10*3/uL — ABNORMAL HIGH (ref 4.0–10.5)

## 2014-01-01 LAB — POCT I-STAT 3, ART BLOOD GAS (G3+)
ACID-BASE DEFICIT: 1 mmol/L (ref 0.0–2.0)
ACID-BASE EXCESS: 3 mmol/L — AB (ref 0.0–2.0)
Acid-base deficit: 3 mmol/L — ABNORMAL HIGH (ref 0.0–2.0)
Acid-base deficit: 4 mmol/L — ABNORMAL HIGH (ref 0.0–2.0)
Acid-base deficit: 3 mmol/L — ABNORMAL HIGH (ref 0.0–2.0)
BICARBONATE: 27 meq/L — AB (ref 20.0–24.0)
BICARBONATE: 22.2 meq/L (ref 20.0–24.0)
Bicarbonate: 23.6 mEq/L (ref 20.0–24.0)
Bicarbonate: 25.8 mEq/L — ABNORMAL HIGH (ref 20.0–24.0)
Bicarbonate: 23.6 mEq/L (ref 20.0–24.0)
O2 SAT: 94 %
O2 SAT: 95 %
O2 Saturation: 100 %
O2 Saturation: 98 %
O2 Saturation: 91 %
PCO2 ART: 46.2 mmHg — AB (ref 35.0–45.0)
PCO2 ART: 44.6 mmHg (ref 35.0–45.0)
PH ART: 7.496 — AB (ref 7.350–7.450)
PH ART: 7.305 — AB (ref 7.350–7.450)
PO2 ART: 75 mmHg — AB (ref 80.0–100.0)
PO2 ART: 67 mmHg — AB (ref 80.0–100.0)
Patient temperature: 36
Patient temperature: 36.9
Patient temperature: 36.8
TCO2: 28 mmol/L (ref 0–100)
TCO2: 25 mmol/L (ref 0–100)
TCO2: 27 mmol/L (ref 0–100)
TCO2: 24 mmol/L (ref 0–100)
TCO2: 25 mmol/L (ref 0–100)
pCO2 arterial: 34.1 mmHg — ABNORMAL LOW (ref 35.0–45.0)
pCO2 arterial: 50.3 mmHg — ABNORMAL HIGH (ref 35.0–45.0)
pCO2 arterial: 46.6 mmHg — ABNORMAL HIGH (ref 35.0–45.0)
pH, Arterial: 7.317 — ABNORMAL LOW (ref 7.350–7.450)
pH, Arterial: 7.313 — ABNORMAL LOW (ref 7.350–7.450)
pH, Arterial: 7.31 — ABNORMAL LOW (ref 7.350–7.450)
pO2, Arterial: 347 mmHg — ABNORMAL HIGH (ref 80.0–100.0)
pO2, Arterial: 124 mmHg — ABNORMAL HIGH (ref 80.0–100.0)
pO2, Arterial: 85 mmHg (ref 80.0–100.0)

## 2014-01-01 LAB — POCT I-STAT 3, VENOUS BLOOD GAS (G3P V)
ACID-BASE DEFICIT: 1 mmol/L (ref 0.0–2.0)
Bicarbonate: 24.1 mEq/L — ABNORMAL HIGH (ref 20.0–24.0)
O2 Saturation: 81 %
PCO2 VEN: 35.4 mmHg — AB (ref 45.0–50.0)
TCO2: 25 mmol/L (ref 0–100)
pH, Ven: 7.429 — ABNORMAL HIGH (ref 7.250–7.300)
pO2, Ven: 38 mmHg (ref 30.0–45.0)

## 2014-01-01 LAB — GLUCOSE, CAPILLARY
GLUCOSE-CAPILLARY: 105 mg/dL — AB (ref 70–99)
GLUCOSE-CAPILLARY: 100 mg/dL — AB (ref 70–99)
GLUCOSE-CAPILLARY: 97 mg/dL (ref 70–99)
GLUCOSE-CAPILLARY: 123 mg/dL — AB (ref 70–99)

## 2014-01-01 LAB — POCT I-STAT 4, (NA,K, GLUC, HGB,HCT)
Glucose, Bld: 109 mg/dL — ABNORMAL HIGH (ref 70–99)
HEMATOCRIT: 36 % — AB (ref 39.0–52.0)
Hemoglobin: 12.2 g/dL — ABNORMAL LOW (ref 13.0–17.0)
Potassium: 4.5 mEq/L (ref 3.7–5.3)
Sodium: 135 mEq/L — ABNORMAL LOW (ref 137–147)

## 2014-01-01 LAB — APTT: aPTT: 43 seconds — ABNORMAL HIGH (ref 24–37)

## 2014-01-01 LAB — HEMOGLOBIN AND HEMATOCRIT, BLOOD
HEMATOCRIT: 30 % — AB (ref 39.0–52.0)
Hemoglobin: 10.2 g/dL — ABNORMAL LOW (ref 13.0–17.0)

## 2014-01-01 LAB — MAGNESIUM: Magnesium: 3.4 mg/dL — ABNORMAL HIGH (ref 1.5–2.5)

## 2014-01-01 LAB — CREATININE, SERUM
Creatinine, Ser: 1.76 mg/dL — ABNORMAL HIGH (ref 0.50–1.35)
GFR calc Af Amer: 51 mL/min — ABNORMAL LOW (ref >90)
GFR calc non Af Amer: 44 mL/min — ABNORMAL LOW (ref >90)

## 2014-01-01 LAB — PROTIME-INR
INR: 1.28 (ref 0.00–1.49)
Prothrombin Time: 16.1 seconds — ABNORMAL HIGH (ref 11.6–15.2)

## 2014-01-01 LAB — PLATELET COUNT: PLATELETS: 213 10*3/uL (ref 150–400)

## 2014-01-01 LAB — HEPARIN LEVEL (UNFRACTIONATED): HEPARIN UNFRACTIONATED: 0.55 [IU]/mL (ref 0.30–0.70)

## 2014-01-01 SURGERY — CORONARY ARTERY BYPASS GRAFTING (CABG)
Anesthesia: General | Site: Chest

## 2014-01-01 MED ORDER — LACTATED RINGERS IV SOLN
INTRAVENOUS | Status: DC
  Administered 2014-01-01: 14:00:00 via INTRAVENOUS

## 2014-01-01 MED ORDER — ALBUMIN HUMAN 5 % IV SOLN
INTRAVENOUS | Status: DC | PRN
  Administered 2014-01-01: 13:00:00 via INTRAVENOUS

## 2014-01-01 MED ORDER — INSULIN REGULAR BOLUS VIA INFUSION
0.0000 [IU] | Freq: Three times a day (TID) | INTRAVENOUS | Status: DC
  Filled 2014-01-01: qty 10

## 2014-01-01 MED ORDER — MORPHINE SULFATE 2 MG/ML IJ SOLN
2.0000 mg | INTRAMUSCULAR | Status: DC | PRN
  Administered 2014-01-01 – 2014-01-02 (×5): 4 mg via INTRAVENOUS
  Filled 2014-01-01 (×5): qty 2
  Filled 2014-01-01: qty 1
  Filled 2014-01-01: qty 2

## 2014-01-01 MED ORDER — NITROGLYCERIN 0.2 MG/ML ON CALL CATH LAB
INTRAVENOUS | Status: DC | PRN
  Administered 2014-01-01: 40 ug via INTRAVENOUS
  Administered 2014-01-01: 20 ug via INTRAVENOUS
  Administered 2014-01-01 (×2): 40 ug via INTRAVENOUS
  Administered 2014-01-01: 20 ug via INTRAVENOUS

## 2014-01-01 MED ORDER — ACETAMINOPHEN 160 MG/5ML PO SOLN: 1000.0000 mg | Freq: Four times a day (QID) | ORAL | Status: DC

## 2014-01-01 MED ORDER — SODIUM CHLORIDE 0.9 % IV SOLN: 250.0000 mL | INTRAVENOUS | Status: DC

## 2014-01-01 MED ORDER — FENTANYL CITRATE 0.05 MG/ML IJ SOLN
INTRAMUSCULAR | Status: AC
  Filled 2014-01-01: qty 2

## 2014-01-01 MED ORDER — TRAMADOL HCL 50 MG PO TABS
50.0000 mg | ORAL_TABLET | ORAL | Status: DC | PRN
  Administered 2014-01-02 – 2014-01-06 (×5): 100 mg via ORAL
  Filled 2014-01-01 (×5): qty 2

## 2014-01-01 MED ORDER — PANTOPRAZOLE SODIUM 40 MG PO TBEC
40.0000 mg | DELAYED_RELEASE_TABLET | Freq: Every day | ORAL | Status: DC
  Administered 2014-01-03 – 2014-01-06 (×4): 40 mg via ORAL
  Filled 2014-01-01 (×4): qty 1

## 2014-01-01 MED ORDER — SODIUM CHLORIDE 0.9 % IJ SOLN: 3.0000 mL | INTRAMUSCULAR | Status: DC | PRN

## 2014-01-01 MED ORDER — THROMBIN 20000 UNITS EX SOLR
CUTANEOUS | Status: DC | PRN
  Administered 2014-01-01: 20000 [IU] via TOPICAL

## 2014-01-01 MED ORDER — LACTATED RINGERS IV SOLN: 500.0000 mL | Freq: Once | INTRAVENOUS | Status: AC | PRN

## 2014-01-01 MED ORDER — VECURONIUM BROMIDE 10 MG IV SOLR
INTRAVENOUS | Status: DC | PRN
  Administered 2014-01-01 (×5): 5 mg via INTRAVENOUS

## 2014-01-01 MED ORDER — ARTIFICIAL TEARS OP OINT
TOPICAL_OINTMENT | OPHTHALMIC | Status: DC | PRN
  Administered 2014-01-01: 1 via OPHTHALMIC

## 2014-01-01 MED ORDER — FENTANYL CITRATE 0.05 MG/ML IJ SOLN
INTRAMUSCULAR | Status: AC
  Filled 2014-01-01: qty 5

## 2014-01-01 MED ORDER — PROPOFOL 10 MG/ML IV BOLUS
INTRAVENOUS | Status: AC
  Filled 2014-01-01: qty 20

## 2014-01-01 MED ORDER — MIDAZOLAM HCL 2 MG/2ML IJ SOLN
INTRAMUSCULAR | Status: AC
  Filled 2014-01-01: qty 2

## 2014-01-01 MED ORDER — SODIUM CHLORIDE 0.9 % IJ SOLN
INTRAMUSCULAR | Status: AC
  Filled 2014-01-01: qty 10

## 2014-01-01 MED ORDER — DOPAMINE-DEXTROSE 3.2-5 MG/ML-% IV SOLN
0.0000 ug/kg/min | INTRAVENOUS | Status: DC
  Administered 2014-01-01: 3 ug/kg/min via INTRAVENOUS

## 2014-01-01 MED ORDER — SODIUM CHLORIDE 0.9 % IV SOLN
200.0000 ug | INTRAVENOUS | Status: DC | PRN
  Administered 2014-01-01: 0.2 ug/kg/h via INTRAVENOUS

## 2014-01-01 MED ORDER — CHLORHEXIDINE GLUCONATE 0.12 % MT SOLN
15.0000 mL | Freq: Two times a day (BID) | OROMUCOSAL | Status: DC
  Filled 2014-01-01: qty 15

## 2014-01-01 MED ORDER — PROTAMINE SULFATE 10 MG/ML IV SOLN
INTRAVENOUS | Status: AC
  Filled 2014-01-01: qty 10

## 2014-01-01 MED ORDER — SODIUM CHLORIDE 0.9 % IV SOLN
250.0000 [IU] | INTRAVENOUS | Status: DC | PRN
  Administered 2014-01-01: 1.3 [IU]/h via INTRAVENOUS

## 2014-01-01 MED ORDER — ASPIRIN EC 325 MG PO TBEC
325.0000 mg | DELAYED_RELEASE_TABLET | Freq: Every day | ORAL | Status: DC
  Administered 2014-01-02 – 2014-01-07 (×6): 325 mg via ORAL
  Filled 2014-01-01 (×6): qty 1

## 2014-01-01 MED ORDER — CETYLPYRIDINIUM CHLORIDE 0.05 % MT LIQD
7.0000 mL | Freq: Four times a day (QID) | OROMUCOSAL | Status: DC
  Administered 2014-01-01 – 2014-01-03 (×7): 7 mL via OROMUCOSAL

## 2014-01-01 MED ORDER — STERILE WATER FOR INJECTION IJ SOLN
INTRAMUSCULAR | Status: AC
  Filled 2014-01-01: qty 10

## 2014-01-01 MED ORDER — INSULIN ASPART 100 UNIT/ML ~~LOC~~ SOLN
0.0000 [IU] | SUBCUTANEOUS | Status: DC
  Administered 2014-01-01: 2 [IU] via SUBCUTANEOUS

## 2014-01-01 MED ORDER — HEMOSTATIC AGENTS (NO CHARGE) OPTIME
TOPICAL | Status: DC | PRN
  Administered 2014-01-01 (×2): 1 via TOPICAL

## 2014-01-01 MED ORDER — SODIUM CHLORIDE 0.9 % IJ SOLN
3.0000 mL | Freq: Two times a day (BID) | INTRAMUSCULAR | Status: DC
  Administered 2014-01-02 – 2014-01-03 (×3): 3 mL via INTRAVENOUS

## 2014-01-01 MED ORDER — ROCURONIUM BROMIDE 100 MG/10ML IV SOLN
INTRAVENOUS | Status: DC | PRN
  Administered 2014-01-01: 50 mg via INTRAVENOUS

## 2014-01-01 MED ORDER — BISACODYL 5 MG PO TBEC
10.0000 mg | DELAYED_RELEASE_TABLET | Freq: Every day | ORAL | Status: DC
  Administered 2014-01-02 – 2014-01-05 (×4): 10 mg via ORAL
  Filled 2014-01-01 (×7): qty 2

## 2014-01-01 MED ORDER — MIDAZOLAM HCL 5 MG/5ML IJ SOLN
INTRAMUSCULAR | Status: DC | PRN
  Administered 2014-01-01: 2 mg via INTRAVENOUS
  Administered 2014-01-01: 3 mg via INTRAVENOUS
  Administered 2014-01-01: 5 mg via INTRAVENOUS
  Administered 2014-01-01: 2 mg via INTRAVENOUS

## 2014-01-01 MED ORDER — MORPHINE SULFATE 2 MG/ML IJ SOLN
1.0000 mg | INTRAMUSCULAR | Status: DC | PRN
  Administered 2014-01-01: 2 mg via INTRAVENOUS
  Administered 2014-01-01: 4 mg via INTRAVENOUS
  Administered 2014-01-01: 2 mg via INTRAVENOUS
  Administered 2014-01-02: 4 mg via INTRAVENOUS
  Filled 2014-01-01: qty 2
  Filled 2014-01-01: qty 1

## 2014-01-01 MED ORDER — METOPROLOL TARTRATE 12.5 MG HALF TABLET
12.5000 mg | ORAL_TABLET | Freq: Two times a day (BID) | ORAL | Status: DC
  Administered 2014-01-02 – 2014-01-03 (×4): 12.5 mg via ORAL
  Filled 2014-01-01 (×7): qty 1

## 2014-01-01 MED ORDER — OXYCODONE HCL 5 MG PO TABS
5.0000 mg | ORAL_TABLET | ORAL | Status: DC | PRN
  Administered 2014-01-02 – 2014-01-06 (×13): 10 mg via ORAL
  Filled 2014-01-01 (×13): qty 2

## 2014-01-01 MED ORDER — ALBUMIN HUMAN 5 % IV SOLN
250.0000 mL | INTRAVENOUS | Status: AC | PRN
Start: 2014-01-01 — End: 2014-01-02
  Administered 2014-01-01 (×2): 250 mL via INTRAVENOUS
  Filled 2014-01-01: qty 250

## 2014-01-01 MED ORDER — TEMAZEPAM 15 MG PO CAPS
15.0000 mg | ORAL_CAPSULE | Freq: Once | ORAL | Status: AC
  Administered 2014-01-01: 15 mg via ORAL

## 2014-01-01 MED ORDER — SODIUM BICARBONATE 8.4 % IV SOLN
50.0000 meq | Freq: Once | INTRAVENOUS | Status: AC
  Administered 2014-01-01: 50 meq via INTRAVENOUS

## 2014-01-01 MED ORDER — DEXTROSE 5 % IV SOLN
1.5000 g | Freq: Two times a day (BID) | INTRAVENOUS | Status: AC
  Administered 2014-01-01 – 2014-01-03 (×4): 1.5 g via INTRAVENOUS
  Filled 2014-01-01 (×4): qty 1.5

## 2014-01-01 MED ORDER — LACTATED RINGERS IV SOLN
INTRAVENOUS | Status: DC | PRN
  Administered 2014-01-01: 07:00:00 via INTRAVENOUS

## 2014-01-01 MED ORDER — MIDAZOLAM HCL 2 MG/2ML IJ SOLN: 2.0000 mg | INTRAMUSCULAR | Status: DC | PRN

## 2014-01-01 MED ORDER — METOPROLOL TARTRATE 25 MG/10 ML ORAL SUSPENSION
12.5000 mg | Freq: Two times a day (BID) | ORAL | Status: DC
  Filled 2014-01-01 (×3): qty 5

## 2014-01-01 MED ORDER — NITROGLYCERIN IN D5W 200-5 MCG/ML-% IV SOLN
0.0000 ug/min | INTRAVENOUS | Status: DC
  Administered 2014-01-01: 0 ug/min via INTRAVENOUS

## 2014-01-01 MED ORDER — PHENYLEPHRINE HCL 10 MG/ML IJ SOLN
0.0000 ug/min | INTRAVENOUS | Status: DC
  Filled 2014-01-01: qty 2

## 2014-01-01 MED ORDER — ACETAMINOPHEN 650 MG RE SUPP
650.0000 mg | Freq: Once | RECTAL | Status: AC
  Administered 2014-01-01: 650 mg via RECTAL

## 2014-01-01 MED ORDER — METOPROLOL TARTRATE 1 MG/ML IV SOLN
2.5000 mg | INTRAVENOUS | Status: DC | PRN
  Administered 2014-01-03 (×2): 2.5 mg via INTRAVENOUS
  Administered 2014-01-03: 5 mg via INTRAVENOUS
  Filled 2014-01-01 (×3): qty 5

## 2014-01-01 MED ORDER — FAMOTIDINE IN NACL 20-0.9 MG/50ML-% IV SOLN
20.0000 mg | Freq: Two times a day (BID) | INTRAVENOUS | Status: DC
  Administered 2014-01-01: 20 mg via INTRAVENOUS

## 2014-01-01 MED ORDER — MAGNESIUM SULFATE 4 GM/100ML IV SOLN
4.0000 g | Freq: Once | INTRAVENOUS | Status: AC
  Administered 2014-01-01: 4 g via INTRAVENOUS
  Filled 2014-01-01: qty 100

## 2014-01-01 MED ORDER — SODIUM CHLORIDE 0.9 % IV SOLN
10.0000 g | INTRAVENOUS | Status: DC | PRN
  Administered 2014-01-01: 5 g/h via INTRAVENOUS

## 2014-01-01 MED ORDER — DEXMEDETOMIDINE HCL IN NACL 200 MCG/50ML IV SOLN
INTRAVENOUS | Status: AC
  Filled 2014-01-01: qty 50

## 2014-01-01 MED ORDER — SODIUM CHLORIDE 0.9 % IV SOLN
INTRAVENOUS | Status: DC
  Administered 2014-01-01: 14:00:00 via INTRAVENOUS

## 2014-01-01 MED ORDER — THROMBIN 20000 UNITS EX SOLR
CUTANEOUS | Status: AC
  Filled 2014-01-01: qty 20000

## 2014-01-01 MED ORDER — DEXMEDETOMIDINE HCL IN NACL 200 MCG/50ML IV SOLN
0.0000 ug/kg/h | INTRAVENOUS | Status: DC
  Administered 2014-01-01: 0.5 ug/kg/h via INTRAVENOUS

## 2014-01-01 MED ORDER — LACTATED RINGERS IV SOLN
INTRAVENOUS | Status: DC | PRN
  Administered 2014-01-01: 06:00:00 via INTRAVENOUS

## 2014-01-01 MED ORDER — VANCOMYCIN HCL IN DEXTROSE 1-5 GM/200ML-% IV SOLN
1000.0000 mg | Freq: Once | INTRAVENOUS | Status: AC
  Administered 2014-01-01: 1000 mg via INTRAVENOUS
  Filled 2014-01-01: qty 200

## 2014-01-01 MED ORDER — FENTANYL CITRATE 0.05 MG/ML IJ SOLN
INTRAMUSCULAR | Status: DC | PRN
  Administered 2014-01-01: 150 ug via INTRAVENOUS
  Administered 2014-01-01: 250 ug via INTRAVENOUS
  Administered 2014-01-01: 500 ug via INTRAVENOUS
  Administered 2014-01-01: 250 ug via INTRAVENOUS
  Administered 2014-01-01: 100 ug via INTRAVENOUS
  Administered 2014-01-01: 500 ug via INTRAVENOUS

## 2014-01-01 MED ORDER — SODIUM CHLORIDE 0.45 % IV SOLN
INTRAVENOUS | Status: DC
  Administered 2014-01-01: 14:00:00 via INTRAVENOUS

## 2014-01-01 MED ORDER — PROTAMINE SULFATE 10 MG/ML IV SOLN
INTRAVENOUS | Status: AC
  Filled 2014-01-01: qty 25

## 2014-01-01 MED ORDER — SODIUM CHLORIDE 0.9 % IV SOLN
INTRAVENOUS | Status: DC
  Filled 2014-01-01: qty 2.5

## 2014-01-01 MED ORDER — ASPIRIN 81 MG PO CHEW: 324.0000 mg | CHEWABLE_TABLET | Freq: Every day | ORAL | Status: DC

## 2014-01-01 MED ORDER — ONDANSETRON HCL 4 MG/2ML IJ SOLN
4.0000 mg | Freq: Four times a day (QID) | INTRAMUSCULAR | Status: DC | PRN
  Administered 2014-01-02: 4 mg via INTRAVENOUS
  Filled 2014-01-01: qty 2

## 2014-01-01 MED ORDER — ACETAMINOPHEN 160 MG/5ML PO SOLN: 650.0000 mg | Freq: Once | ORAL | Status: AC

## 2014-01-01 MED ORDER — NITROGLYCERIN IN D5W 200-5 MCG/ML-% IV SOLN
INTRAVENOUS | Status: DC | PRN
  Administered 2014-01-01: 5 ug/min via INTRAVENOUS

## 2014-01-01 MED ORDER — DOCUSATE SODIUM 100 MG PO CAPS
200.0000 mg | ORAL_CAPSULE | Freq: Every day | ORAL | Status: DC
  Administered 2014-01-02 – 2014-01-07 (×6): 200 mg via ORAL
  Filled 2014-01-01 (×6): qty 2

## 2014-01-01 MED ORDER — CHLORHEXIDINE GLUCONATE 0.12 % MT SOLN
15.0000 mL | Freq: Two times a day (BID) | OROMUCOSAL | Status: DC
  Administered 2014-01-01 – 2014-01-03 (×3): 15 mL via OROMUCOSAL
  Filled 2014-01-01 (×2): qty 15

## 2014-01-01 MED ORDER — MIDAZOLAM HCL 10 MG/2ML IJ SOLN
INTRAMUSCULAR | Status: AC
  Filled 2014-01-01: qty 2

## 2014-01-01 MED ORDER — POTASSIUM CHLORIDE 10 MEQ/50ML IV SOLN: 10.0000 meq | INTRAVENOUS | Status: AC

## 2014-01-01 MED ORDER — PROTAMINE SULFATE 10 MG/ML IV SOLN
INTRAVENOUS | Status: DC | PRN
  Administered 2014-01-01: 70 mg via INTRAVENOUS
  Administered 2014-01-01: 100 mg via INTRAVENOUS
  Administered 2014-01-01: 20 mg via INTRAVENOUS
  Administered 2014-01-01: 25 mg via INTRAVENOUS
  Administered 2014-01-01: 5 mg via INTRAVENOUS
  Administered 2014-01-01: 100 mg via INTRAVENOUS

## 2014-01-01 MED ORDER — HEMOSTATIC AGENTS (NO CHARGE) OPTIME
TOPICAL | Status: DC | PRN
  Administered 2014-01-01: 1 via TOPICAL

## 2014-01-01 MED ORDER — 0.9 % SODIUM CHLORIDE (POUR BTL) OPTIME
TOPICAL | Status: DC | PRN
  Administered 2014-01-01: 1000 mL

## 2014-01-01 MED ORDER — VECURONIUM BROMIDE 10 MG IV SOLR
INTRAVENOUS | Status: AC
  Filled 2014-01-01: qty 10

## 2014-01-01 MED ORDER — PROPOFOL 10 MG/ML IV BOLUS
INTRAVENOUS | Status: DC | PRN
  Administered 2014-01-01: 30 mg via INTRAVENOUS

## 2014-01-01 MED ORDER — BISACODYL 10 MG RE SUPP: 10.0000 mg | Freq: Every day | RECTAL | Status: DC

## 2014-01-01 MED ORDER — HEPARIN SODIUM (PORCINE) 1000 UNIT/ML IJ SOLN
INTRAMUSCULAR | Status: DC | PRN
  Administered 2014-01-01: 33 mL via INTRAVENOUS

## 2014-01-01 MED ORDER — PHENYLEPHRINE HCL 10 MG/ML IJ SOLN
10.0000 mg | INTRAVENOUS | Status: DC | PRN
  Administered 2014-01-01: 35 ug/min via INTRAVENOUS

## 2014-01-01 MED ORDER — ACETAMINOPHEN 500 MG PO TABS
1000.0000 mg | ORAL_TABLET | Freq: Four times a day (QID) | ORAL | Status: DC
  Administered 2014-01-02 – 2014-01-06 (×16): 1000 mg via ORAL
  Filled 2014-01-01 (×22): qty 2

## 2014-01-01 SURGICAL SUPPLY — 102 items
ATTRACTOMAT 16X20 MAGNETIC DRP (DRAPES) ×4 IMPLANT
BAG DECANTER FOR FLEXI CONT (MISCELLANEOUS) ×4 IMPLANT
BANDAGE ELASTIC 4 VELCRO ST LF (GAUZE/BANDAGES/DRESSINGS) ×4 IMPLANT
BANDAGE ELASTIC 6 VELCRO ST LF (GAUZE/BANDAGES/DRESSINGS) ×4 IMPLANT
BASKET HEART  (ORDER IN 25'S) (MISCELLANEOUS) ×1
BASKET HEART (ORDER IN 25'S) (MISCELLANEOUS) ×1
BASKET HEART (ORDER IN 25S) (MISCELLANEOUS) ×2 IMPLANT
BLADE STERNUM SYSTEM 6 (BLADE) ×4 IMPLANT
BNDG GAUZE ELAST 4 BULKY (GAUZE/BANDAGES/DRESSINGS) ×4 IMPLANT
CANISTER SUCTION 2500CC (MISCELLANEOUS) ×4 IMPLANT
CARDIAC SUCTION (MISCELLANEOUS) ×4 IMPLANT
CATH ROBINSON RED A/P 18FR (CATHETERS) ×8 IMPLANT
CATH THORACIC 28FR (CATHETERS) ×4 IMPLANT
CATH THORACIC 36FR (CATHETERS) ×4 IMPLANT
CATH THORACIC 36FR RT ANG (CATHETERS) ×4 IMPLANT
CLIP TI MEDIUM 24 (CLIP) IMPLANT
CLIP TI WIDE RED SMALL 24 (CLIP) ×4 IMPLANT
COVER SURGICAL LIGHT HANDLE (MISCELLANEOUS) ×4 IMPLANT
CRADLE DONUT ADULT HEAD (MISCELLANEOUS) ×4 IMPLANT
DRAPE CARDIOVASCULAR INCISE (DRAPES) ×2
DRAPE SLUSH/WARMER DISC (DRAPES) ×4 IMPLANT
DRAPE SRG 135X102X78XABS (DRAPES) ×2 IMPLANT
DRSG COVADERM 4X14 (GAUZE/BANDAGES/DRESSINGS) ×4 IMPLANT
ELECT CAUTERY BLADE 6.4 (BLADE) ×4 IMPLANT
ELECT REM PT RETURN 9FT ADLT (ELECTROSURGICAL) ×8
ELECTRODE REM PT RTRN 9FT ADLT (ELECTROSURGICAL) ×4 IMPLANT
GAUZE SPONGE 4X4 12PLY STRL (GAUZE/BANDAGES/DRESSINGS) ×8 IMPLANT
GLOVE BIO SURGEON STRL SZ 6 (GLOVE) ×12 IMPLANT
GLOVE BIO SURGEON STRL SZ 6.5 (GLOVE) ×9 IMPLANT
GLOVE BIO SURGEON STRL SZ7 (GLOVE) ×8 IMPLANT
GLOVE BIO SURGEON STRL SZ7.5 (GLOVE) IMPLANT
GLOVE BIO SURGEONS STRL SZ 6.5 (GLOVE) ×3
GLOVE BIOGEL PI IND STRL 6 (GLOVE) ×4 IMPLANT
GLOVE BIOGEL PI IND STRL 6.5 (GLOVE) IMPLANT
GLOVE BIOGEL PI IND STRL 7.0 (GLOVE) IMPLANT
GLOVE BIOGEL PI INDICATOR 6 (GLOVE) ×4
GLOVE BIOGEL PI INDICATOR 6.5 (GLOVE)
GLOVE BIOGEL PI INDICATOR 7.0 (GLOVE)
GLOVE EUDERMIC 7 POWDERFREE (GLOVE) ×8 IMPLANT
GLOVE ORTHO TXT STRL SZ7.5 (GLOVE) IMPLANT
GOWN STRL REUS W/ TWL LRG LVL3 (GOWN DISPOSABLE) ×8 IMPLANT
GOWN STRL REUS W/ TWL XL LVL3 (GOWN DISPOSABLE) ×2 IMPLANT
GOWN STRL REUS W/TWL LRG LVL3 (GOWN DISPOSABLE) ×8
GOWN STRL REUS W/TWL XL LVL3 (GOWN DISPOSABLE) ×2
HEMOSTAT POWDER SURGIFOAM 1G (HEMOSTASIS) ×12 IMPLANT
HEMOSTAT SURGICEL 2X14 (HEMOSTASIS) ×4 IMPLANT
INSERT FOGARTY 61MM (MISCELLANEOUS) IMPLANT
INSERT FOGARTY XLG (MISCELLANEOUS) IMPLANT
KIT BASIN OR (CUSTOM PROCEDURE TRAY) ×4 IMPLANT
KIT CATH CPB BARTLE (MISCELLANEOUS) ×4 IMPLANT
KIT ROOM TURNOVER OR (KITS) ×4 IMPLANT
KIT SUCTION CATH 14FR (SUCTIONS) ×4 IMPLANT
KIT VASOVIEW W/TROCAR VH 2000 (KITS) ×4 IMPLANT
NS IRRIG 1000ML POUR BTL (IV SOLUTION) ×20 IMPLANT
PACK OPEN HEART (CUSTOM PROCEDURE TRAY) ×4 IMPLANT
PAD ARMBOARD 7.5X6 YLW CONV (MISCELLANEOUS) ×8 IMPLANT
PAD ELECT DEFIB RADIOL ZOLL (MISCELLANEOUS) ×4 IMPLANT
PENCIL BUTTON HOLSTER BLD 10FT (ELECTRODE) ×4 IMPLANT
PUNCH AORTIC ROTATE 4.0MM (MISCELLANEOUS) IMPLANT
PUNCH AORTIC ROTATE 4.5MM 8IN (MISCELLANEOUS) ×4 IMPLANT
PUNCH AORTIC ROTATE 5MM 8IN (MISCELLANEOUS) IMPLANT
SEALANT SURG COSEAL 4ML (VASCULAR PRODUCTS) ×4 IMPLANT
SENSOR MYOCARDIAL TEMP (MISCELLANEOUS) ×4 IMPLANT
SET CARDIOPLEGIA MPS 5001102 (MISCELLANEOUS) ×4 IMPLANT
SPONGE GAUZE 4X4 12PLY STER LF (GAUZE/BANDAGES/DRESSINGS) ×8 IMPLANT
SPONGE INTESTINAL PEANUT (DISPOSABLE) IMPLANT
SPONGE LAP 18X18 X RAY DECT (DISPOSABLE) IMPLANT
SPONGE LAP 4X18 X RAY DECT (DISPOSABLE) ×4 IMPLANT
SUT BONE WAX W31G (SUTURE) ×4 IMPLANT
SUT MNCRL AB 4-0 PS2 18 (SUTURE) IMPLANT
SUT PROLENE 3 0 SH DA (SUTURE) IMPLANT
SUT PROLENE 3 0 SH1 36 (SUTURE) ×4 IMPLANT
SUT PROLENE 4 0 RB 1 (SUTURE)
SUT PROLENE 4 0 SH DA (SUTURE) IMPLANT
SUT PROLENE 4-0 RB1 .5 CRCL 36 (SUTURE) IMPLANT
SUT PROLENE 5 0 C 1 36 (SUTURE) IMPLANT
SUT PROLENE 6 0 C 1 30 (SUTURE) IMPLANT
SUT PROLENE 7 0 BV 1 (SUTURE) IMPLANT
SUT PROLENE 7 0 BV1 MDA (SUTURE) ×8 IMPLANT
SUT PROLENE 8 0 BV175 6 (SUTURE) IMPLANT
SUT SILK  1 MH (SUTURE)
SUT SILK 1 MH (SUTURE) IMPLANT
SUT STEEL STERNAL CCS#1 18IN (SUTURE) IMPLANT
SUT STEEL SZ 6 DBL 3X14 BALL (SUTURE) ×12 IMPLANT
SUT VIC AB 1 CTX 36 (SUTURE) ×4
SUT VIC AB 1 CTX36XBRD ANBCTR (SUTURE) ×4 IMPLANT
SUT VIC AB 2-0 CT1 27 (SUTURE) ×2
SUT VIC AB 2-0 CT1 TAPERPNT 27 (SUTURE) ×2 IMPLANT
SUT VIC AB 2-0 CTX 27 (SUTURE) IMPLANT
SUT VIC AB 3-0 SH 27 (SUTURE)
SUT VIC AB 3-0 SH 27X BRD (SUTURE) IMPLANT
SUT VIC AB 3-0 X1 27 (SUTURE) ×4 IMPLANT
SUT VICRYL 4-0 PS2 18IN ABS (SUTURE) IMPLANT
SUTURE E-PAK OPEN HEART (SUTURE) ×4 IMPLANT
SYSTEM SAHARA CHEST DRAIN ATS (WOUND CARE) ×4 IMPLANT
TAPE CLOTH SURG 4X10 WHT LF (GAUZE/BANDAGES/DRESSINGS) ×4 IMPLANT
TOWEL OR 17X24 6PK STRL BLUE (TOWEL DISPOSABLE) ×4 IMPLANT
TOWEL OR 17X26 10 PK STRL BLUE (TOWEL DISPOSABLE) ×4 IMPLANT
TRAY FOLEY IC TEMP SENS 16FR (CATHETERS) ×4 IMPLANT
TUBING INSUFFLATION (TUBING) ×4 IMPLANT
UNDERPAD 30X30 INCONTINENT (UNDERPADS AND DIAPERS) ×4 IMPLANT
WATER STERILE IRR 1000ML POUR (IV SOLUTION) ×8 IMPLANT

## 2014-01-01 NOTE — Anesthesia Procedure Notes (Signed)
Procedure Name: Intubation Date/Time: 01/01/2014 8:19 AM Performed by: Trixie Deis A Pre-anesthesia Checklist: Patient identified Patient Re-evaluated:Patient Re-evaluated prior to inductionOxygen Delivery Method: Circle system utilized Preoxygenation: Pre-oxygenation with 100% oxygen Intubation Type: IV induction Ventilation: Oral airway inserted - appropriate to patient size Laryngoscope Size: Mac and 3 Grade View: Grade IV Tube type: Oral Tube size: 8.0 mm Number of attempts: 1 Placement Confirmation: ETT inserted through vocal cords under direct vision,  positive ETCO2 and breath sounds checked- equal and bilateral Secured at: 21 cm Tube secured with: Tape Dental Injury: Teeth and Oropharynx as per pre-operative assessment

## 2014-01-01 NOTE — Procedures (Signed)
Extubation Procedure Note  Patient Details:   Name: Seth Keller DOB: Jan 03, 1966 MRN: [id number]   Airway Documentation:     Evaluation  O2 sats: stable throughout Complications: No apparent complications Patient did tolerate procedure well. Bilateral Breath Sounds: Clear, Diminished Suctioning: Oral, Airway Yes   Pt NIF -40, VC 0.750, positive for cuff leak. Pt extubated to 6L [place] based on current ABG. Pt stable throughout with no complications. Pt able to speak name post extubation. RT will continue to monitor.   Seth Keller, Seth Keller 01/01/2014, 6:16 PM

## 2014-01-01 NOTE — Progress Notes (Signed)
*  PRELIMINARY RESULTS* Echocardiogram Echocardiogram Transesophageal has been performed.  WILLIAMSON, WHITTED 01/01/2014, 12:23 PM

## 2014-01-01 NOTE — OR Nursing (Signed)
2nd call SICU, 1300.

## 2014-01-01 NOTE — Brief Op Note (Signed)
[address]       [place] [address]             [phone]     12/26/2013 - 01/01/2014  11:49 AM  PATIENT:  Seth Keller  48 y.o. male  PRE-OPERATIVE DIAGNOSIS:  CAD  POST-OPERATIVE DIAGNOSIS:  Coronary artery disease  PROCEDURE:  Procedure(s): CORONARY ARTERY BYPASS GRAFTING (CABG)X4 LIMA-LAD; SEQ SVG-OM CXPD; SVG-RAMUS INTRAOPERATIVE TRANSESOPHAGEAL ECHOCARDIOGRAM [facility] RIGHT LEG  SURGEON:  Surgeon(s): Gaye Pollack, MD  PHYSICIAN ASSISTANT: WAYNE GOLD PA-C  ANESTHESIA:   general  PATIENT CONDITION:  ICU - intubated and hemodynamically stable.  PRE-OPERATIVE WEIGHT: 100kg  EBL- SEE ANESTHESIA/PERFUSION RECORDS  COMPLICATIONS: NO KNOWN

## 2014-01-01 NOTE — OR Nursing (Signed)
1st call to SICU Bill [id number].

## 2014-01-01 NOTE — Progress Notes (Signed)
[facility] BRIEF SICU PROGRESS NOTE  Day of Surgery  S/P Procedure(s) (LRB): CORONARY ARTERY BYPASS GRAFTING (CABG) times four using left internal mammary artery and right saphenous vein (N/A) INTRAOPERATIVE TRANSESOPHAGEAL ECHOCARDIOGRAM (N/A)   Extubated uneventfully AAI paced w/ stable hemodynamics Chest tube output low UOP adequate Labs okay  Plan: Continue routine early postop  Seth Keller,Seth Keller 01/01/2014 8:26 PM

## 2014-01-01 NOTE — Transfer of Care (Signed)
Immediate Anesthesia Transfer of Care Note  Patient: Seth Keller  Procedure(s) Performed: Procedure(s): CORONARY ARTERY BYPASS GRAFTING (CABG) times four using left internal mammary artery and right saphenous vein (N/A) INTRAOPERATIVE TRANSESOPHAGEAL ECHOCARDIOGRAM (N/A)  Patient Location: SICU  Anesthesia Type:General  Level of Consciousness: sedated and Patient remains intubated per anesthesia plan  Airway & Oxygen Therapy: Patient remains intubated per anesthesia plan and Patient placed on Ventilator (see vital sign flow sheet for setting)  Post-op Assessment: Report given to PACU RN and Post -op Vital signs reviewed and stable  Post vital signs: Reviewed and stable  Complications: No apparent anesthesia complications

## 2014-01-01 NOTE — Anesthesia Preprocedure Evaluation (Addendum)
Anesthesia Evaluation  Patient identified by MRN, date of birth, ID band Patient awake    Reviewed: Allergy & Precautions, H&P , NPO status , Patient's Chart, lab work & pertinent test results, reviewed documented beta blocker date and time   History of Anesthesia Complications Negative for: history of anesthetic complications (No prior anesthetics)  Airway Mallampati: III  TM Distance: >3 FB Neck ROM: Full    Dental  (+) Chipped, Teeth Intact, Dental Advisory Given,    Pulmonary former smoker (quit a week ago),  breath sounds clear to auscultation  Pulmonary exam normal       Cardiovascular hypertension, + CAD, + Past MI and +CHF Rhythm:Regular Rate:Normal   12/26/13 ECHO:  akinesis of the basal-midinferolateral and inferior myocardium. EF 45%   Neuro/Psych negative neurological ROS     GI/Hepatic negative GI ROS, Neg liver ROS,   Endo/Other  Morbid obesity  Renal/GU Renal InsufficiencyRenal disease (creat 1.93)     Musculoskeletal   Abdominal (+) + obese,   Peds  Hematology   Anesthesia Other Findings   Reproductive/Obstetrics                         Anesthesia Physical Anesthesia Plan  ASA: III  Anesthesia Plan: General   Post-op Pain Management:    Induction: Intravenous  Airway Management Planned: Oral ETT  Additional Equipment: Arterial line, CVP, PA Cath, TEE and Ultrasound Guidance Line Placement  Intra-op Plan:   Post-operative Plan: Post-operative intubation/ventilation  Informed Consent: I have reviewed the patients History and Physical, chart, labs and discussed the procedure including the risks, benefits and alternatives for the proposed anesthesia with the patient or authorized representative who has indicated his/her understanding and acceptance.   Dental advisory given  Plan Discussed with: CRNA, Anesthesiologist and Surgeon  Anesthesia Plan Comments: (Plan routine  monitors, A line, PA cath, GETA with TEE and post op ventilation)       Anesthesia Quick Evaluation

## 2014-01-01 NOTE — Op Note (Signed)
CARDIOVASCULAR SURGERY OPERATIVE NOTE  01/01/2014  Surgeon:  Gaye Pollack, MD  First Assistant: Jadene Pierini,  PA-C   Preoperative Diagnosis:  Severe multi-vessel coronary artery disease, s/p NSTEMI   Postoperative Diagnosis:  Same   Procedure:  1. Median Sternotomy 2. Extracorporeal circulation 3.   Coronary artery bypass grafting x 4   Left internal mammary graft to the LAD  SVG to Ramus  Sequential SVG to OM1 and PDA (LCX)  4.   Endoscopic vein harvest from the right leg   Anesthesia:  General Endotracheal   Clinical History/Surgical Indication:  The patient has a history of hypertension for about 15 years, smoking and a family history of coronary disease and has been noncompliant with his medications and not taking any regularly. He says that he changed his diet and thought that would take care of his BP. Two days prior to admission he had an episode of chest pain that lasted about one minute and resolved.Then the next day he had sudden onset of shortness of breath. He took some aspirin and Advil and the symptoms improved so that he could sleep. Then the morning of admission he developed recurrent shortness of breath after awakening and he went to an urgent care. He was transferred to [facility] and was noted to be severely hypertensive and had positive troponins peaking at 2.98. He has had no further symptoms since admission. Cath yesterday shows a large wrap-around LAD with a proximal eccentric 70% stenosis and a mid eccentric 60-70% stenosis. The FFR was 0.78. The apical LAD has a 90% segmental stenosis near the apex. The LCX is severely and diffusely diseased with total occlusion of the OM1. The distal vessel is diffusely diseased with 85% stenosis before the OM2 and PDA. The Ramus has diffuse proximal to mid 80% stenosis. The RCA is nondominant and totally occluded.  He has  severe multi-vessel coronary disease with mild LV dysfunction and a markedly elevated LVEDP presenting with a NSTEMI. He also has stage III chronic kidney disease with a creatinine of 1.36 in 05/2012 and 1.73 on admission now. It is 1.91 today after cath yesterday. He has poorly controlled hypertension which is the likely cause of his renal dysfunction. I agree that CABG is the best long term treatment for him given his 3-vessel disease and diffusely diseased LCX. I discussed the operative procedure with the patient including alternatives, benefits and risks; including but not limited to bleeding, blood transfusion, infection, stroke, myocardial infarction, graft failure, heart block requiring a permanent pacemaker, organ dysfunction, and death. Randeep Karan understands and agrees to proceed.   Preparation:  The patient was seen in the preoperative holding area and the correct patient, correct operation were confirmed with the patient after reviewing the medical record and catheterization. The consent was signed by me. Preoperative antibiotics were given. A pulmonary arterial line and radial arterial line were placed by the anesthesia team. The patient was taken back to the operating room and positioned supine on the operating room table. After being placed under general endotracheal anesthesia by the anesthesia team a foley catheter was placed. The neck, chest, abdomen, and both legs were prepped with betadine soap and solution and draped in the usual sterile manner. A surgical time-out was taken and the correct patient and operative procedure were confirmed with the nursing and anesthesia staff.   Cardiopulmonary Bypass:  A median sternotomy was performed. The pericardium was opened in the midline. Right ventricular function appeared normal. The ascending aorta was of  normal size and had no palpable plaque. There were no contraindications to aortic cannulation or cross-clamping. The patient was fully  systemically heparinized and the ACT was maintained > 400 sec. The proximal aortic arch was cannulated with a 20 F aortic cannula for arterial inflow. Venous cannulation was performed via the right atrial appendage using a two-staged venous cannula. An antegrade cardioplegia/vent cannula was inserted into the mid-ascending aorta. Aortic occlusion was performed with a single cross-clamp. Systemic cooling to 32 degrees Centigrade and topical cooling of the heart with iced saline were used. Hyperkalemic antegrade cold blood cardioplegia was used to induce diastolic arrest and was then given at about 20 minute intervals throughout the period of arrest to maintain myocardial temperature at or below 10 degrees centigrade. A temperature probe was inserted into the interventricular septum and an insulating pad was placed in the pericardium.   Left internal mammary harvest:  The left side of the sternum was retracted using the Rultract retractor. The left internal mammary artery was harvested as a pedicle graft. All side branches were clipped. It was a medium-sized vessel of good quality with excellent blood flow. It was ligated distally and divided. It was sprayed with topical papaverine solution to prevent vasospasm.   Endoscopic vein harvest:  The right greater saphenous vein was harvested endoscopically through a 2 cm incision medial to the right knee. It was harvested from the upper thigh to below the knee. It was a medium-sized vein of good quality. The side branches were all ligated with 4-0 silk ties.    Coronary arteries:  The coronary arteries were examined.   LAD:  Diffusely diseased  LCX:  Ramus is a large vessel that is heavily diseased proximally. It was tracked into the muscle where it had minimal disease and was graftable. The OM1 is a moderate sized vessel that is diffusely diseased but graftable. The PDA is diffusely diseased and large enough to graft proximally but then becomes small.    There is scar on the inferior and lateral wall consistent with prior MI.  RCA:  Small, non-dominant   Grafts:  1. LIMA to the LAD: 2.0 mm. It was sewn end to side using 8-0 prolene continuous suture. 2. SVG to Ramus:  1.75 mm. It was sewn end to side using 7-0 prolene continuous suture. 3. Sequential SVG to OM1:  1.6 mm. It was sewn sequential side to side using 7-0 prolene continuous suture. 4. Sequential SVG to PDA:  1.6 mm. It was sewn sequential end to side using 7-0 prolene continuous suture.  The proximal vein graft anastomoses were performed to the mid-ascending aorta using continuous 6-0 prolene suture. Graft markers were placed around the proximal anastomoses.   Completion:  The patient was rewarmed to 37 degrees Centigrade. The clamp was removed from the LIMA pedicle and there was rapid warming of the septum and return of ventricular fibrillation. The crossclamp was removed with a time of 98 minutes. There was spontaneous return of sinus rhythm. The distal and proximal anastomoses were checked for hemostasis. The position of the grafts was satisfactory. Two temporary epicardial pacing wires were placed on the right atrium and two on the right ventricle. The patient was weaned from CPB without difficulty on dopamine 5 mcg. CPB time was 119 minutes. Cardiac output was 5 LPM. Heparin was fully reversed with protamine and the aortic and venous cannulas removed. Hemostasis was achieved. Mediastinal and left pleural drainage tubes were placed. The sternum was closed with double #6 stainless steel  wires. The fascia was closed with continuous # 1 vicryl suture. The subcutaneous tissue was closed with 2-0 vicryl continuous suture. The skin was closed with 3-0 vicryl subcuticular suture. All sponge, needle, and instrument counts were reported correct at the end of the case. Dry sterile dressings were placed over the incisions and around the chest tubes which were connected to pleurevac suction.  The patient was then transported to the surgical intensive care unit in critical but stable condition.

## 2014-01-01 NOTE — OR Nursing (Signed)
Pre op time out done at [facility] not 0858.

## 2014-01-02 ENCOUNTER — Inpatient Hospital Stay (HOSPITAL_COMMUNITY): Payer: Medicaid Other

## 2014-01-02 DIAGNOSIS — I309 Acute pericarditis, unspecified: Secondary | ICD-10-CM

## 2014-01-02 LAB — BASIC METABOLIC PANEL
ANION GAP: 13 (ref 5–15)
BUN: 22 mg/dL (ref 6–23)
CALCIUM: 8.8 mg/dL (ref 8.4–10.5)
CO2: 24 meq/L (ref 19–32)
Chloride: 106 mEq/L (ref 96–112)
Creatinine, Ser: 1.82 mg/dL — ABNORMAL HIGH (ref 0.50–1.35)
GFR calc Af Amer: 49 mL/min — ABNORMAL LOW (ref >90)
GFR calc non Af Amer: 42 mL/min — ABNORMAL LOW (ref >90)
GLUCOSE: 116 mg/dL — AB (ref 70–99)
Potassium: 5.4 mEq/L — ABNORMAL HIGH (ref 3.7–5.3)
Sodium: 143 mEq/L (ref 137–147)

## 2014-01-02 LAB — CBC
HCT: 36.6 % — ABNORMAL LOW (ref 39.0–52.0)
HCT: 37.2 % — ABNORMAL LOW (ref 39.0–52.0)
HEMOGLOBIN: 11.9 g/dL — AB (ref 13.0–17.0)
HEMOGLOBIN: 11.7 g/dL — AB (ref 13.0–17.0)
MCH: 27.7 pg (ref 26.0–34.0)
MCH: 27.3 pg (ref 26.0–34.0)
MCHC: 32.5 g/dL (ref 30.0–36.0)
MCHC: 31.5 g/dL (ref 30.0–36.0)
MCV: 85.1 fL (ref 78.0–100.0)
MCV: 86.7 fL (ref 78.0–100.0)
Platelets: 218 10*3/uL (ref 150–400)
Platelets: 189 10*3/uL (ref 150–400)
RBC: 4.3 MIL/uL (ref 4.22–5.81)
RBC: 4.29 MIL/uL (ref 4.22–5.81)
RDW: 14.4 % (ref 11.5–15.5)
RDW: 14.6 % (ref 11.5–15.5)
WBC: 15 10*3/uL — AB (ref 4.0–10.5)
WBC: 14.4 10*3/uL — AB (ref 4.0–10.5)

## 2014-01-02 LAB — GLUCOSE, CAPILLARY
Glucose-Capillary: 115 mg/dL — ABNORMAL HIGH (ref 70–99)
Glucose-Capillary: 114 mg/dL — ABNORMAL HIGH (ref 70–99)
Glucose-Capillary: 117 mg/dL — ABNORMAL HIGH (ref 70–99)

## 2014-01-02 LAB — POCT I-STAT, CHEM 8
BUN: 25 mg/dL — AB (ref 6–23)
CALCIUM ION: 1.23 mmol/L (ref 1.12–1.23)
Chloride: 102 mEq/L (ref 96–112)
Creatinine, Ser: 2.3 mg/dL — ABNORMAL HIGH (ref 0.50–1.35)
GLUCOSE: 105 mg/dL — AB (ref 70–99)
HCT: 38 % — ABNORMAL LOW (ref 39.0–52.0)
Hemoglobin: 12.9 g/dL — ABNORMAL LOW (ref 13.0–17.0)
Potassium: 4.7 mEq/L (ref 3.7–5.3)
Sodium: 137 mEq/L (ref 137–147)
TCO2: 26 mmol/L (ref 0–100)

## 2014-01-02 LAB — MAGNESIUM
Magnesium: 2.9 mg/dL — ABNORMAL HIGH (ref 1.5–2.5)
Magnesium: 2.7 mg/dL — ABNORMAL HIGH (ref 1.5–2.5)

## 2014-01-02 LAB — CREATININE, SERUM
CREATININE: 2.22 mg/dL — AB (ref 0.50–1.35)
GFR calc non Af Amer: 33 mL/min — ABNORMAL LOW (ref >90)
GFR, EST AFRICAN AMERICAN: 39 mL/min — AB (ref >90)

## 2014-01-02 MED ORDER — FUROSEMIDE 10 MG/ML IJ SOLN
40.0000 mg | Freq: Once | INTRAMUSCULAR | Status: AC
  Administered 2014-01-02: 40 mg via INTRAVENOUS

## 2014-01-02 MED ORDER — MIDAZOLAM HCL 2 MG/2ML IJ SOLN
2.0000 mg | Freq: Once | INTRAMUSCULAR | Status: AC
  Administered 2014-01-02: 2 mg via INTRAVENOUS
  Filled 2014-01-02: qty 2

## 2014-01-02 MED ORDER — MORPHINE SULFATE 2 MG/ML IJ SOLN
2.0000 mg | INTRAMUSCULAR | Status: DC | PRN
  Administered 2014-01-02 – 2014-01-03 (×2): 2 mg via INTRAVENOUS
  Filled 2014-01-02 (×2): qty 1

## 2014-01-02 MED ORDER — SODIUM CHLORIDE 0.9 % IV SOLN: INTRAVENOUS | Status: DC | PRN

## 2014-01-02 MED ORDER — INSULIN ASPART 100 UNIT/ML ~~LOC~~ SOLN
0.0000 [IU] | SUBCUTANEOUS | Status: DC
  Administered 2014-01-02 – 2014-01-03 (×2): 2 [IU] via SUBCUTANEOUS

## 2014-01-02 NOTE — Progress Notes (Signed)
PROGRESS NOTE  Subjective:   48 yo admitted with  NSTEMI on 11/7.  Found to have severe disease.  Is now s/p CABG CORONARY ARTERY BYPASS GRAFTING (CABG)X4 LIMA-LAD; SEQ SVG-OM CXPD; SVG-RAMUS  ECG shows mild St elevation - c/w pericarditis  Pt is doing well.  Chest wall pain and tenderness but no further angina  Objective:    Vital Signs:   Temp:  [96.4 F (35.8 C)-99.1 F (37.3 C)] 98.4 F (36.9 C) (11/14 1015) Pulse Rate:  [78-91] 84 (11/14 1015) Resp:  [12-34] 34 (11/14 1015) BP: (70-156)/(49-94) 121/72 mmHg (11/14 1000) SpO2:  [93 %-100 %] 93 % (11/14 1015) Arterial Line BP: (74-183)/(53-103) 138/76 mmHg (11/14 1015) FiO2 (%):  [50 %] 50 % (11/13 1500) Weight:  [238 lb 12.1 oz (108.3 kg)] 238 lb 12.1 oz (108.3 kg) (11/14 0500)  Last BM Date: 12/31/13   24-hour weight change: Weight change: 16 lb 4.1 oz (7.375 kg)  Weight trends: Filed Weights   12/31/13 0414 01/01/14 0447 01/02/14 0500  Weight: 225 lb 9.6 oz (102.331 kg) 222 lb 8 oz (100.925 kg) 238 lb 12.1 oz (108.3 kg)    Intake/Output:  11/13 0701 - 11/14 0700 In: 4620.4 [I.V.:3142.4; Blood:548; NG/GT:30; IV Piggyback:900] Out: [id number] [Urine:3970; Blood:2215; Chest Tube:380] Total I/O In: 214.1 [I.V.:164.1; IV Piggyback:50] Out: 250 [Urine:170; Chest Tube:80]   Physical Exam: BP 121/72 mmHg  Pulse 84  Temp(Src) 98.4 F (36.9 C) (Core (Comment))  Resp 34  Ht 5\' 9"  (1.753 m)  Wt 238 lb 12.1 oz (108.3 kg)  BMI 35.24 kg/m2  SpO2 93%  Wt Readings from Last 3 Encounters:  01/02/14 238 lb 12.1 oz (108.3 kg)  12/26/13 228 lb (103.42 kg)  05/29/12 221 lb (100.245 kg)    General: Vital signs reviewed and noted.   Head: Normocephalic, atraumatic.  Eyes: conjunctivae/corneas clear.  EOM's intact.   Throat: normal  Neck:  swan ganz catheter in   Lungs:    clear, soft pleural rub  Heart:  RR, soft rub  Abdomen:  Soft, non-tender, non-distended    Extremities: No edema, no clubbing      Neurologic: A&O X3, CN II - XII are grossly intact.   Psych: Normal     Labs: BMET:  Recent Labs  01/01/14 0522  01/01/14 1937 01/01/14 1940 01/02/14 0429  NA 141  < > 138  --  143  K 4.0  < > 5.1  --  5.4*  CL 102  < > 106  --  106  CO2 24  --   --   --  24  GLUCOSE 93  < > 123*  --  116*  BUN 25*  < > 22  --  22  CREATININE 1.93*  < > 1.80* 1.76* 1.82*  CALCIUM 9.4  --   --   --  8.8  MG  --   --   --  3.4* 2.9*  < > = values in this interval not displayed.  Liver function tests: No results for input(s): AST, ALT, ALKPHOS, BILITOT, PROT, ALBUMIN in the last 72 hours. No results for input(s): LIPASE, AMYLASE in the last 72 hours.  CBC:  Recent Labs  12/31/13 0315  01/01/14 1940 01/02/14 0429  WBC 15.3*  < > 15.4* 15.0*  NEUTROABS 10.4*  --   --   --   HGB 13.1  < > 11.8* 11.9*  HCT 39.5  < > 35.0* 36.6*  MCV 82.6  < >  81.8 85.1  PLT 301  < > 217 218  < > = values in this interval not displayed.  Cardiac Enzymes: No results for input(s): CKTOTAL, CKMB, TROPONINI in the last 72 hours.  Coagulation Studies:  Recent Labs  01/01/14 1400  LABPROT 16.1*  INR 1.28    Other: Invalid input(s): POCBNP No results for input(s): DDIMER in the last 72 hours. No results for input(s): HGBA1C in the last 72 hours. No results for input(s): CHOL, HDL, LDLCALC, TRIG, CHOLHDL in the last 72 hours. No results for input(s): TSH, T4TOTAL, T3FREE, THYROIDAB in the last 72 hours.  Invalid input(s): FREET3 No results for input(s): VITAMINB12, FOLATE, FERRITIN, TIBC, IRON, RETICCTPCT in the last 72 hours.   Other results:  EKG:  NSR at 87.  Diffuse ST elevation anteriroly, TIW in lateral leads.    Medications:    Infusions: . sodium chloride 20 mL/hr at 01/02/14 0700  . sodium chloride 20 mL/hr at 01/02/14 0700  . sodium chloride    . dexmedetomidine Stopped (01/01/14 1600)  . DOPamine 3 mcg/kg/min (01/02/14 0800)  . lactated ringers 20 mL/hr at 01/02/14 0700  .  nitroGLYCERIN 30 mcg/min (01/02/14 0800)  . phenylephrine (NEO-SYNEPHRINE) Adult infusion 0 mcg/min (01/01/14 1400)    Scheduled Medications: . acetaminophen  1,000 mg Oral 4 times per day   Or  . acetaminophen (TYLENOL) oral liquid 160 mg/5 mL  1,000 mg Per Tube 4 times per day  . antiseptic oral rinse  7 mL Mouth Rinse QID  . aspirin EC  325 mg Oral Daily   Or  . aspirin  324 mg Per Tube Daily  . atorvastatin  40 mg Oral q1800  . bisacodyl  10 mg Oral Daily   Or  . bisacodyl  10 mg Rectal Daily  . cefUROXime (ZINACEF)  IV  1.5 g Intravenous Q12H  . chlorhexidine  15 mL Mouth Rinse BID  . docusate sodium  200 mg Oral Daily  . insulin aspart  0-24 Units Subcutaneous 6 times per day  . metoprolol tartrate  12.5 mg Oral BID   Or  . metoprolol tartrate  12.5 mg Per Tube BID  . [START ON 01/03/2014] pantoprazole  40 mg Oral Daily  . sodium chloride  3 mL Intravenous Q12H    Assessment/ Plan:      Non-ST elevation myocardial infarction (NSTEMI), initial episode of care S/p CABG CORONARY ARTERY BYPASS GRAFTING (CABG)X4 LIMA-LAD; SEQ SVG-OM CXPD; SVG-RAMUS Mild pericarditis by ECG and exam.  No symptoms of angina or ischemia    2.   Hypertensive urgency Has resolved.  BP is normal today   3.   Chronic renal insufficiency, stage III (moderate) Cr = 1.82 today   Continue supportive care.  Disposition:   Length of Stay: 7  Thayer Headings, Brooke Bonito., MD, [facility] 01/02/2014, 10:31 AM Office [phone] Pager [phone]

## 2014-01-02 NOTE — Progress Notes (Addendum)
[address]       [place] [address]             [phone]        CARDIOTHORACIC SURGERY PROGRESS NOTE   R1 Day Post-Op Procedure(s) (LRB): CORONARY ARTERY BYPASS GRAFTING (CABG) times four using left internal mammary artery and right saphenous vein (N/A) INTRAOPERATIVE TRANSESOPHAGEAL ECHOCARDIOGRAM (N/A)  Subjective: Feels nauseated and sore in chest.  No SOB.  Objective: Vital signs: BP Readings from Last 1 Encounters:  01/02/14 136/78   Pulse Readings from Last 1 Encounters:  01/02/14 83   Resp Readings from Last 1 Encounters:  01/02/14 23   Temp Readings from Last 1 Encounters:  01/02/14 98.4 F (36.9 C)     Hemodynamics: PAP: (20-52)/(5-30) 33/13 mmHg CO:  [4 L/min-6.2 L/min] 5 L/min CI:  [1.8 L/min/m2-2.9 L/min/m2] 2.3 L/min/m2  Physical Exam:  Rhythm:   sinus  Breath sounds: clear  Heart sounds:  RRR  Incisions:  Dressings dry, intact  Abdomen:  Soft, non-distended, non-tender  Extremities:  Warm, well-perfused    Intake/Output from previous day: 11/13 0701 - 11/14 0700 In: 4620.4 [I.V.:3142.4; Blood:548; NG/GT:30; IV Piggyback:900] Out: [id number] [Urine:3970; Blood:2215; Chest Tube:380] Intake/Output this shift: Total I/O In: 214.1 [I.V.:164.1; IV Piggyback:50] Out: 250 [Urine:170; Chest Tube:80]  Lab Results:  CBC: Recent Labs  01/01/14 1940 01/02/14 0429  WBC 15.4* 15.0*  HGB 11.8* 11.9*  HCT 35.0* 36.6*  PLT 217 218    BMET:  Recent Labs  01/01/14 0522  01/01/14 1937 01/01/14 1940 01/02/14 0429  NA 141  < > 138  --  143  K 4.0  < > 5.1  --  5.4*  CL 102  < > 106  --  106  CO2 24  --   --   --  24  GLUCOSE 93  < > 123*  --  116*  BUN 25*  < > 22  --  22  CREATININE 1.93*  < > 1.80* 1.76* 1.82*  CALCIUM 9.4  --   --   --  8.8  < > = values in this interval not displayed.   CBG (last 3)   Recent Labs  01/02/14 0045 01/02/14 0405 01/02/14 0905  GLUCAP 114* 115* 117*    ABG    Component Value  Date/Time   PHART 7.310* 01/01/2014 1940   PCO2ART 46.6* 01/01/2014 1940   PO2ART 85.0 01/01/2014 1940   HCO3 23.6 01/01/2014 1940   TCO2 25 01/01/2014 1940   ACIDBASEDEF 3.0* 01/01/2014 1940   O2SAT 95.0 01/01/2014 1940    CXR: PORTABLE CHEST - 1 VIEW  COMPARISON: 01/01/2014  FINDINGS: Cardiac shadow remains enlarged. Postsurgical changes are noted. Mediastinal drain, pericardial drain, left thoracostomy catheter and Swan-Ganz catheter are again noted and stable. The endotracheal tube and nasogastric catheter have been removed in the interval. The overall inspiratory effort although poor shows no significant infiltrate.  IMPRESSION: Tubes and lines as described above.  No focal infiltrate is noted. The overall inspiratory effort is poor.   Electronically Signed  By: Inez Catalina M.D.  On: 01/02/2014 08:23  Assessment/Plan: S/P Procedure(s) (LRB): CORONARY ARTERY BYPASS GRAFTING (CABG) times four using left internal mammary artery and right saphenous vein (N/A) INTRAOPERATIVE TRANSESOPHAGEAL ECHOCARDIOGRAM (N/A)  Overall stable POD1 Maintaining NSR w/ stable hemodynamics on low dose dopamine Expected post op acute blood loss anemia, mild, stable Expected post op volume excess, mild Expected post op atelectasis, mild Chronic renal insufficiency, stable  Hypertension   Mobilize  D/C dopamine  D/C tubes and lines  Lasix to stimulate diuresis  Seth Keller H 01/02/2014 11:32 AM

## 2014-01-02 NOTE — Progress Notes (Signed)
[facility] BRIEF SICU PROGRESS NOTE  1 Day Post-Op  S/P Procedure(s) (LRB): CORONARY ARTERY BYPASS GRAFTING (CABG) times four using left internal mammary artery and right saphenous vein (N/A) INTRAOPERATIVE TRANSESOPHAGEAL ECHOCARDIOGRAM (N/A)   Stable day NSR w/ stable BP O2 sats 97% on 2 L/min UOP adequate  Plan: Continue current plan  Seth Keller H 01/02/2014 8:14 PM

## 2014-01-02 NOTE — Progress Notes (Signed)
EKG CRITICAL VALUE     12 lead EKG performed.  Critical value noted.  Milagros Loll, RN notified.   Lazarius Rivkin, CCT 01/02/2014 9:31 AM

## 2014-01-03 ENCOUNTER — Inpatient Hospital Stay (HOSPITAL_COMMUNITY): Payer: Medicaid Other

## 2014-01-03 LAB — GLUCOSE, CAPILLARY
GLUCOSE-CAPILLARY: 125 mg/dL — AB (ref 70–99)
Glucose-Capillary: 117 mg/dL — ABNORMAL HIGH (ref 70–99)
Glucose-Capillary: 99 mg/dL (ref 70–99)
Glucose-Capillary: 136 mg/dL — ABNORMAL HIGH (ref 70–99)
Glucose-Capillary: 120 mg/dL — ABNORMAL HIGH (ref 70–99)
Glucose-Capillary: 106 mg/dL — ABNORMAL HIGH (ref 70–99)
Glucose-Capillary: 100 mg/dL — ABNORMAL HIGH (ref 70–99)
Glucose-Capillary: 95 mg/dL (ref 70–99)

## 2014-01-03 LAB — BASIC METABOLIC PANEL
Anion gap: 12 (ref 5–15)
BUN: 29 mg/dL — AB (ref 6–23)
CALCIUM: 9 mg/dL (ref 8.4–10.5)
CO2: 26 meq/L (ref 19–32)
Chloride: 96 mEq/L (ref 96–112)
Creatinine, Ser: 2.61 mg/dL — ABNORMAL HIGH (ref 0.50–1.35)
GFR calc Af Amer: 32 mL/min — ABNORMAL LOW (ref >90)
GFR calc non Af Amer: 27 mL/min — ABNORMAL LOW (ref >90)
Glucose, Bld: 99 mg/dL (ref 70–99)
Potassium: 4.3 mEq/L (ref 3.7–5.3)
SODIUM: 134 meq/L — AB (ref 137–147)

## 2014-01-03 LAB — CBC
HEMATOCRIT: 34.1 % — AB (ref 39.0–52.0)
Hemoglobin: 11 g/dL — ABNORMAL LOW (ref 13.0–17.0)
MCH: 28 pg (ref 26.0–34.0)
MCHC: 32.3 g/dL (ref 30.0–36.0)
MCV: 86.8 fL (ref 78.0–100.0)
Platelets: 175 10*3/uL (ref 150–400)
RBC: 3.93 MIL/uL — AB (ref 4.22–5.81)
RDW: 14.4 % (ref 11.5–15.5)
WBC: 14.5 10*3/uL — AB (ref 4.0–10.5)

## 2014-01-03 MED ORDER — SODIUM CHLORIDE 0.9 % IV SOLN: 250.0000 mL | INTRAVENOUS | Status: DC | PRN

## 2014-01-03 MED ORDER — LABETALOL HCL 5 MG/ML IV SOLN
10.0000 mg | INTRAVENOUS | Status: DC | PRN
  Administered 2014-01-03 – 2014-01-04 (×3): 10 mg via INTRAVENOUS
  Filled 2014-01-03 (×2): qty 4

## 2014-01-03 MED ORDER — CETYLPYRIDINIUM CHLORIDE 0.05 % MT LIQD
7.0000 mL | Freq: Two times a day (BID) | OROMUCOSAL | Status: DC
  Administered 2014-01-03 – 2014-01-07 (×7): 7 mL via OROMUCOSAL

## 2014-01-03 MED ORDER — SODIUM CHLORIDE 0.9 % IJ SOLN: 3.0000 mL | INTRAMUSCULAR | Status: DC | PRN

## 2014-01-03 MED ORDER — MOVING RIGHT ALONG BOOK
Freq: Once | Status: AC
  Administered 2014-01-03: 13:00:00
  Filled 2014-01-03: qty 1

## 2014-01-03 MED ORDER — MIDAZOLAM HCL 2 MG/2ML IJ SOLN: 2.0000 mg | Freq: Once | INTRAMUSCULAR | Status: AC

## 2014-01-03 MED ORDER — SODIUM CHLORIDE 0.9 % IJ SOLN
3.0000 mL | Freq: Two times a day (BID) | INTRAMUSCULAR | Status: DC
  Administered 2014-01-03 – 2014-01-07 (×7): 3 mL via INTRAVENOUS

## 2014-01-03 MED ORDER — FUROSEMIDE 40 MG PO TABS
40.0000 mg | ORAL_TABLET | Freq: Two times a day (BID) | ORAL | Status: DC
  Administered 2014-01-03 – 2014-01-06 (×6): 40 mg via ORAL
  Filled 2014-01-03 (×8): qty 1

## 2014-01-03 NOTE — Progress Notes (Signed)
[address]       [place] [address]             [phone]        CARDIOTHORACIC SURGERY PROGRESS NOTE   R2 Days Post-Op Procedure(s) (LRB): CORONARY ARTERY BYPASS GRAFTING (CABG) times four using left internal mammary artery and right saphenous vein (N/A) INTRAOPERATIVE TRANSESOPHAGEAL ECHOCARDIOGRAM (N/A)  Subjective: Feels well.  Mild soreness  Objective: Vital signs: BP Readings from Last 1 Encounters:  01/03/14 126/74   Pulse Readings from Last 1 Encounters:  01/03/14 75   Resp Readings from Last 1 Encounters:  01/03/14 21   Temp Readings from Last 1 Encounters:  01/03/14 98 F (36.7 C) Oral    Hemodynamics:    Physical Exam:  Rhythm:   sinus  Breath sounds: clear  Heart sounds:  RRR  Incisions:  Clean and dry  Abdomen:  Soft, non-distended, non-tender  Extremities:  Warm, well-perfused    Intake/Output from previous day: 11/14 0701 - 11/15 0700 In: 1709.8 [P.O.:1110; I.V.:499.8; IV Piggyback:100] Out: 1455 [Urine:1375; Chest Tube:80] Intake/Output this shift: Total I/O In: 80 [I.V.:30; IV Piggyback:50] Out: 160 [Urine:160]  Lab Results:  CBC: Recent Labs  01/02/14 1630 01/02/14 1633 01/03/14 0410  WBC 14.4*  --  14.5*  HGB 11.7* 12.9* 11.0*  HCT 37.2* 38.0* 34.1*  PLT 189  --  175    BMET:  Recent Labs  01/02/14 0429  01/02/14 1633 01/03/14 0410  NA 143  --  137 134*  K 5.4*  --  4.7 4.3  CL 106  --  102 96  CO2 24  --   --  26  GLUCOSE 116*  --  105* 99  BUN 22  --  25* 29*  CREATININE 1.82*  < > 2.30* 2.61*  CALCIUM 8.8  --   --  9.0  < > = values in this interval not displayed.   CBG (last 3)   Recent Labs  01/03/14 0018 01/03/14 0359 01/03/14 0840  GLUCAP 125* 120* 106*    ABG    Component Value Date/Time   PHART 7.310* 01/01/2014 1940   PCO2ART 46.6* 01/01/2014 1940   PO2ART 85.0 01/01/2014 1940   HCO3 23.6 01/01/2014 1940   TCO2 26 01/02/2014 1633   ACIDBASEDEF 3.0* 01/01/2014  1940   O2SAT 95.0 01/01/2014 1940    CXR: PORTABLE CHEST - 1 VIEW  COMPARISON: 01/02/2014 and earlier priors  FINDINGS: There are changes of median sternotomy for CABG. Cardiac silhouette is enlarged and stable. A right IJ sheath remains in place. The Swan-Ganz catheter has been removed. Mediastinal drain and left-sided chest tubes have been removed.  Mild atelectasis in the left perihilar region and left lung base. Negative for pulmonary edema. No definite pleural effusion. Negative for pneumothorax. Bones appear within normal limits.  IMPRESSION: Interval removal of support devices as described above. Negative for pneumothorax.  Cardiomegaly with scattered areas of atelectasis on the left.   Electronically Signed  By: Curlene Dolphin M.D.  On: 01/03/2014 08:30  Assessment/Plan: S/P Procedure(s) (LRB): CORONARY ARTERY BYPASS GRAFTING (CABG) times four using left internal mammary artery and right saphenous vein (N/A) INTRAOPERATIVE TRANSESOPHAGEAL ECHOCARDIOGRAM (N/A)  Overall stable POD2 Maintaining NSR w/ stable BP Expected post op acute blood loss anemia, mild, stable Expected post op volume excess, mild Expected post op atelectasis, mild Chronic renal insufficiency, creatinine up 2.6 today - good UOP Hypertension - well controlled   Mobilize  Lasix to stimulate  diuresis  Watch renal function  Transfer 2W  Rexene Alberts 01/03/2014 12:58 PM

## 2014-01-03 NOTE — Progress Notes (Signed)
PROGRESS NOTE  Subjective:   48 yo admitted with  NSTEMI on 11/7.  Found to have severe disease.  Is now s/p CABG CORONARY ARTERY BYPASS GRAFTING (CABG)X4 LIMA-LAD; SEQ SVG-OM CXPD; SVG-RAMUS  ECG yesterday showed  mild St elevation - c/w pericarditis  Pt is doing well.  Chest wall pain and tenderness but no further angina  Objective:    Vital Signs:   Temp:  [96.3 F (35.7 C)-98.5 F (36.9 C)] 96.3 F (35.7 C) (11/15 0839) Pulse Rate:  [76-112] 78 (11/15 0925) Resp:  [15-34] 18 (11/15 0925) BP: (119-172)/(71-139) 125/82 mmHg (11/15 0800) SpO2:  [91 %-100 %] 93 % (11/15 0925) Arterial Line BP: (94-173)/(71-90) 149/80 mmHg (11/14 1300) Weight:  [229 lb 8 oz (104.1 kg)] 229 lb 8 oz (104.1 kg) (11/15 0500)  Last BM Date: 12/31/13   24-hour weight change: Weight change: -9 lb 4.2 oz (-4.2 kg)  Weight trends: Filed Weights   01/01/14 0447 01/02/14 0500 01/03/14 0500  Weight: 222 lb 8 oz (100.925 kg) 238 lb 12.1 oz (108.3 kg) 229 lb 8 oz (104.1 kg)    Intake/Output:  11/14 0701 - 11/15 0700 In: 1709.8 [P.O.:1110; I.V.:499.8; IV Piggyback:100] Out: 1455 [Urine:1375; Chest Tube:80] Total I/O In: 70 [I.V.:20; IV Piggyback:50] Out: 80 [Urine:80]   Physical Exam: BP 125/82 mmHg  Pulse 78  Temp(Src) 96.3 F (35.7 C) (Axillary)  Resp 18  Ht 5\' 9"  (1.753 m)  Wt 229 lb 8 oz (104.1 kg)  BMI 33.88 kg/m2  SpO2 93%  Wt Readings from Last 3 Encounters:  01/03/14 229 lb 8 oz (104.1 kg)  12/26/13 228 lb (103.42 kg)  05/29/12 221 lb (100.245 kg)    General: Vital signs reviewed and noted.   Head: Normocephalic, atraumatic.  Eyes: conjunctivae/corneas clear.  EOM's intact.   Throat: normal  Neck: Normal   Lungs:    clear,   Heart:  RR, no significant rub heard today   Abdomen:  Soft, non-tender, non-distended    Extremities: No edema, no clubbing    Neurologic: A&O X3, CN II - XII are grossly intact.   Psych: Normal     Labs: BMET:  Recent Labs  01/02/14 0429 01/02/14 1630 01/02/14 1633 01/03/14 0410  NA 143  --  137 134*  K 5.4*  --  4.7 4.3  CL 106  --  102 96  CO2 24  --   --  26  GLUCOSE 116*  --  105* 99  BUN 22  --  25* 29*  CREATININE 1.82* 2.22* 2.30* 2.61*  CALCIUM 8.8  --   --  9.0  MG 2.9* 2.7*  --   --     Liver function tests: No results for input(s): AST, ALT, ALKPHOS, BILITOT, PROT, ALBUMIN in the last 72 hours. No results for input(s): LIPASE, AMYLASE in the last 72 hours.  CBC:  Recent Labs  01/02/14 1630 01/02/14 1633 01/03/14 0410  WBC 14.4*  --  14.5*  HGB 11.7* 12.9* 11.0*  HCT 37.2* 38.0* 34.1*  MCV 86.7  --  86.8  PLT 189  --  175    Cardiac Enzymes: No results for input(s): CKTOTAL, CKMB, TROPONINI in the last 72 hours.  Coagulation Studies:  Recent Labs  01/01/14 1400  LABPROT 16.1*  INR 1.28    Other: Invalid input(s): POCBNP No results for input(s): DDIMER in the last 72 hours. No results for input(s): HGBA1C in the last 72 hours. No results for input(s):  CHOL, HDL, LDLCALC, TRIG, CHOLHDL in the last 72 hours. No results for input(s): TSH, T4TOTAL, T3FREE, THYROIDAB in the last 72 hours.  Invalid input(s): FREET3 No results for input(s): VITAMINB12, FOLATE, FERRITIN, TIBC, IRON, RETICCTPCT in the last 72 hours.   Other results:  EKG:  NSR at 87.  Diffuse ST elevation anteriroly, TIW in lateral leads.    Medications:    Infusions: . sodium chloride 250 mL (01/02/14 1700)    Scheduled Medications: . acetaminophen  1,000 mg Oral 4 times per day  . antiseptic oral rinse  7 mL Mouth Rinse QID  . aspirin EC  325 mg Oral Daily  . atorvastatin  40 mg Oral q1800  . bisacodyl  10 mg Oral Daily   Or  . bisacodyl  10 mg Rectal Daily  . chlorhexidine  15 mL Mouth Rinse BID  . docusate sodium  200 mg Oral Daily  . insulin aspart  0-24 Units Subcutaneous 6 times per day  . metoprolol tartrate  12.5 mg Oral BID  . pantoprazole  40 mg Oral Daily  . sodium chloride  3  mL Intravenous Q12H    Assessment/ Plan:      1.  Non-ST elevation myocardial infarction (NSTEMI), initial episode of care S/p CABG CORONARY ARTERY BYPASS GRAFTING (CABG)X4 LIMA-LAD; SEQ SVG-OM CXPD; SVG-RAMUS Mild pericarditis by ECG and exam yesterday - clearly improving .  No symptoms of angina or ischemia   2.   Hypertensive urgency Has resolved.  BP is normal today   3.   Chronic renal insufficiency, stage III (moderate)  Lab Results  Component Value Date   CREATININE 2.61* 01/03/2014    Continue supportive care.  Disposition:   Length of Stay: 8  Thayer Headings, Brooke Bonito., MD, [facility] 01/03/2014, 9:36 AM Office [phone] Pager [phone]

## 2014-01-03 NOTE — Progress Notes (Signed)
[facility] BRIEF SICU PROGRESS NOTE  2 Days Post-Op  S/P Procedure(s) (LRB): CORONARY ARTERY BYPASS GRAFTING (CABG) times four using left internal mammary artery and right saphenous vein (N/A) INTRAOPERATIVE TRANSESOPHAGEAL ECHOCARDIOGRAM (N/A)   Patient woke up from sleep disoriented, in pain w/ BP up > [id number] systolic Currently feels fine but BP still elevated 175/98  Plan: Will start prn labetalol for severe HTN and hold transfer  Rexene Alberts 01/03/2014 5:33 PM

## 2014-01-04 ENCOUNTER — Encounter (HOSPITAL_COMMUNITY): Payer: Self-pay | Admitting: Surgery

## 2014-01-04 ENCOUNTER — Inpatient Hospital Stay (HOSPITAL_COMMUNITY): Payer: Medicaid Other

## 2014-01-04 LAB — BASIC METABOLIC PANEL
Anion gap: 15 (ref 5–15)
BUN: 29 mg/dL — ABNORMAL HIGH (ref 6–23)
CO2: 25 mEq/L (ref 19–32)
Calcium: 8.9 mg/dL (ref 8.4–10.5)
Chloride: 99 mEq/L (ref 96–112)
Creatinine, Ser: 2.29 mg/dL — ABNORMAL HIGH (ref 0.50–1.35)
GFR calc non Af Amer: 32 mL/min — ABNORMAL LOW (ref >90)
GFR, EST AFRICAN AMERICAN: 37 mL/min — AB (ref >90)
Glucose, Bld: 104 mg/dL — ABNORMAL HIGH (ref 70–99)
Potassium: 4.3 mEq/L (ref 3.7–5.3)
Sodium: 139 mEq/L (ref 137–147)

## 2014-01-04 LAB — CBC
HCT: 34.6 % — ABNORMAL LOW (ref 39.0–52.0)
HEMOGLOBIN: 11.1 g/dL — AB (ref 13.0–17.0)
MCH: 27.5 pg (ref 26.0–34.0)
MCHC: 32.1 g/dL (ref 30.0–36.0)
MCV: 85.6 fL (ref 78.0–100.0)
Platelets: 191 10*3/uL (ref 150–400)
RBC: 4.04 MIL/uL — ABNORMAL LOW (ref 4.22–5.81)
RDW: 14.6 % (ref 11.5–15.5)
WBC: 13.1 10*3/uL — ABNORMAL HIGH (ref 4.0–10.5)

## 2014-01-04 MED ORDER — AMIODARONE HCL IN DEXTROSE 360-4.14 MG/200ML-% IV SOLN
30.0000 mg/h | INTRAVENOUS | Status: DC
  Filled 2014-01-04 (×2): qty 200

## 2014-01-04 MED ORDER — AMIODARONE HCL IN DEXTROSE 360-4.14 MG/200ML-% IV SOLN
60.0000 mg/h | INTRAVENOUS | Status: DC
  Administered 2014-01-04 – 2014-01-05 (×2): 60 mg/h via INTRAVENOUS
  Filled 2014-01-04 (×2): qty 200

## 2014-01-04 MED ORDER — HYDRALAZINE HCL 20 MG/ML IJ SOLN: 10.0000 mg | INTRAMUSCULAR | Status: DC | PRN

## 2014-01-04 MED ORDER — METOPROLOL TARTRATE 25 MG PO TABS
25.0000 mg | ORAL_TABLET | Freq: Two times a day (BID) | ORAL | Status: DC
  Administered 2014-01-04 – 2014-01-07 (×6): 25 mg via ORAL
  Filled 2014-01-04 (×9): qty 1

## 2014-01-04 MED FILL — Potassium Chloride Inj 2 mEq/ML: INTRAVENOUS | Qty: 40 | Status: AC

## 2014-01-04 MED FILL — Mannitol IV Soln 20%: INTRAVENOUS | Qty: 500 | Status: AC

## 2014-01-04 MED FILL — Heparin Sodium (Porcine) Inj 1000 Unit/ML: INTRAMUSCULAR | Qty: 30 | Status: AC

## 2014-01-04 MED FILL — Electrolyte-R (PH 7.4) Solution: INTRAVENOUS | Qty: 3000 | Status: AC

## 2014-01-04 MED FILL — Heparin Sodium (Porcine) Inj 1000 Unit/ML: INTRAMUSCULAR | Qty: 10 | Status: AC

## 2014-01-04 MED FILL — Magnesium Sulfate Inj 50%: INTRAMUSCULAR | Qty: 10 | Status: AC

## 2014-01-04 MED FILL — Sodium Bicarbonate IV Soln 8.4%: INTRAVENOUS | Qty: 50 | Status: AC

## 2014-01-04 MED FILL — Lidocaine HCl IV Inj 20 MG/ML: INTRAVENOUS | Qty: 5 | Status: AC

## 2014-01-04 MED FILL — Dexmedetomidine HCl in NaCl 0.9% IV Soln 400 MCG/100ML: INTRAVENOUS | Qty: 100 | Status: AC

## 2014-01-04 MED FILL — Sodium Chloride IV Soln 0.9%: INTRAVENOUS | Qty: 2000 | Status: AC

## 2014-01-04 NOTE — Progress Notes (Signed)
CARDIAC REHAB PHASE I   PRE:  Rate/Rhythm: 82 SR  BP:  Supine:   Sitting: 139/79  Standing:    SaO2: 97 RA  MODE:  Ambulation: 348 ft   POST:  Rate/Rhythm: 88  BP:  Supine:   Sitting: 150/90  Standing:    SaO2: 94 RA 1425-1500 On arrival pt in recliner.Pt states that he has walked three times today. Checked with RN and he has walked only once. Pt oriented but making some comments that the wall paper is moving and ask how long does he do this routine. Pt very cooperative and willing to walk.Assisted X 1 and used walker to ambulate. Gait stead with walker. Pt has slow pace. He was able to walk 348 feet.without c/o. Pt back to recliner after walk with call light in reach.  Rodney Langton RN 01/04/2014 3:01 PM

## 2014-01-04 NOTE — Progress Notes (Signed)
Patient HR at 120's to 140,Atrial fibrillation confirmed by 12 leads EKG,BP-136/84,patient is asymptomatic,Dr.Vantright made aware with order to start Amiodarone drip per protocol without bolus.Will continue to monitor closely. Anaily Ashbaugh RN

## 2014-01-04 NOTE — Progress Notes (Signed)
10 mg IV labetalol given for elevated BP.

## 2014-01-04 NOTE — Anesthesia Postprocedure Evaluation (Signed)
  Anesthesia Post-op Note  Patient: Seth Keller  Procedure(s) Performed: Procedure(s): CORONARY ARTERY BYPASS GRAFTING (CABG) times four using left internal mammary artery and right saphenous vein (N/A) INTRAOPERATIVE TRANSESOPHAGEAL ECHOCARDIOGRAM (N/A)  Patient Location: Nursing Unit  Anesthesia Type:General  Level of Consciousness: awake, alert , oriented and patient cooperative  Airway and Oxygen Therapy: Patient Spontanous Breathing  Post-op Pain: mild  Post-op Assessment: Post-op Vital signs reviewed, Patient's Cardiovascular Status Stable, Respiratory Function Stable, Patent Airway, No signs of Nausea or vomiting, Adequate PO intake and Pain level controlled  Post-op Vital Signs: Reviewed and stable  Last Vitals:  Filed Vitals:   01/04/14 1435  BP: 139/79  Pulse: 83  Temp: 37.2 C  Resp: 22    Complications: No apparent anesthesia complications

## 2014-01-04 NOTE — Progress Notes (Signed)
10mg  IV labetalol given for increased BP

## 2014-01-04 NOTE — Progress Notes (Signed)
3 Days Post-Op Procedure(s) (LRB): CORONARY ARTERY BYPASS GRAFTING (CABG) times four using left internal mammary artery and right saphenous vein (N/A) INTRAOPERATIVE TRANSESOPHAGEAL ECHOCARDIOGRAM (N/A) Subjective: No complaints  Objective: Vital signs in last 24 hours: Temp:  [96.3 F (35.7 C)-99.3 F (37.4 C)] 98.2 F (36.8 C) (11/16 0743) Pulse Rate:  [56-105] 79 (11/16 0700) Cardiac Rhythm:  [-] Normal sinus rhythm (11/16 0400) Resp:  [15-34] 34 (11/16 0700) BP: (126-193)/(64-113) 145/82 mmHg (11/16 0700) SpO2:  [93 %-100 %] 96 % (11/16 0700) Weight:  [102.9 kg (226 lb 13.7 oz)] 102.9 kg (226 lb 13.7 oz) (11/16 0500)  Hemodynamic parameters for last 24 hours:    Intake/Output from previous day: 11/15 0701 - 11/16 0700 In: 800 [P.O.:720; I.V.:30; IV Piggyback:50] Out: 635 [Urine:635] Intake/Output this shift:    General appearance: alert and cooperative Neurologic: intact Heart: regular rate and rhythm, S1, S2 normal, no murmur, click, rub or gallop Lungs: clear to auscultation bilaterally Extremities: extremities normal, atraumatic, no cyanosis or edema Wound: incision ok  Lab Results:  Recent Labs  01/03/14 0410 01/04/14 0305  WBC 14.5* 13.1*  HGB 11.0* 11.1*  HCT 34.1* 34.6*  PLT 175 191   BMET:  Recent Labs  01/03/14 0410 01/04/14 0305  NA 134* 139  K 4.3 4.3  CL 96 99  CO2 26 25  GLUCOSE 99 104*  BUN 29* 29*  CREATININE 2.61* 2.29*  CALCIUM 9.0 8.9    PT/INR:  Recent Labs  01/01/14 1400  LABPROT 16.1*  INR 1.28   ABG    Component Value Date/Time   PHART 7.310* 01/01/2014 1940   HCO3 23.6 01/01/2014 1940   TCO2 26 01/02/2014 1633   ACIDBASEDEF 3.0* 01/01/2014 1940   O2SAT 95.0 01/01/2014 1940   CBG (last 3)   Recent Labs  01/03/14 0840 01/03/14 1212 01/03/14 1629  GLUCAP 106* 100* 95    Assessment/Plan: S/P Procedure(s) (LRB): CORONARY ARTERY BYPASS GRAFTING (CABG) times four using left internal mammary artery and right  saphenous vein (N/A) INTRAOPERATIVE TRANSESOPHAGEAL ECHOCARDIOGRAM (N/A)  Stage III chronic kidney disease: Creat improving from peak of 2.61 yesterday. preop 1.9. Mobilize, IS Diabetes control: preop Hgb A1c was 5.7. Will DC SSI and CGB's. He needs to stay on a CHO modified diet. Plan for transfer to step-down: see transfer orders   LOS: 9 days    BARTLE,BRYAN K 01/04/2014

## 2014-01-04 NOTE — Progress Notes (Signed)
  Amiodarone Drug - Drug Interaction Consult Note  Recommendations: Monitor and counsel patient to report any muscle pain or weakness with concomitant statin and amiodarone.  Monitor for bradycardia, AV block, and myocardial depression with concomitant metoprolol and amiodarone.   Monitor for hypokalemia closely with concomitant lasix and amiodarone.   Amiodarone is metabolized by the cytochrome P450 system and therefore has the potential to cause many drug interactions. Amiodarone has an average plasma half-life of 50 days (range 20 to 100 days).   There is potential for drug interactions to occur several weeks or months after stopping treatment and the onset of drug interactions may be slow after initiating amiodarone.   [x]  Statins: Increased risk of myopathy. Simvastatin- restrict dose to 20mg  daily. Other statins: counsel patients to report any muscle pain or weakness immediately.  [x]  Beta blockers: increased risk of bradycardia, AV block and myocardial depression. Sotalol - avoid concomitant use.  [x]  Diuretics: increased risk of cardiotoxicity if hypokalemia occurs.  Thank You,  Brain Hilts  01/04/2014 10:22 PM

## 2014-01-04 NOTE — Plan of Care (Signed)
Problem: Phase II - Intermediate Post-Op Goal: Wean to Extubate Outcome: Completed/Met Date Met:  01/04/14 Goal: Maintain Hemodynamic Stability Outcome: Completed/Met Date Met:  01/04/14 Goal: CBGs/Blood Glucose per SCIP Criteria Outcome: Completed/Met Date Met:  01/04/14 Goal: Pain controlled with appropriate interventions Outcome: Completed/Met Date Met:  01/04/14 Goal: Advance Diet Outcome: Completed/Met Date Met:  01/04/14 Goal: Activity Progressed Outcome: Completed/Met Date Met:  01/04/14 Goal: Patient advanced to Phase III: Barriers addressed Outcome: Completed/Met Date Met:  01/04/14  Problem: Phase III - Recovery through Discharge Goal: Maintain Hemodynamic Stability Outcome: Progressing PRN labetalol added for BP control Goal: Activity Progressed Outcome: Progressing Pt ambulated x2 abd refused 3rd attempt.

## 2014-01-05 MED ORDER — AMIODARONE HCL 200 MG PO TABS
400.0000 mg | ORAL_TABLET | Freq: Three times a day (TID) | ORAL | Status: DC
  Administered 2014-01-05 – 2014-01-07 (×7): 400 mg via ORAL
  Filled 2014-01-05 (×11): qty 2

## 2014-01-05 MED ORDER — AMLODIPINE BESYLATE 5 MG PO TABS
5.0000 mg | ORAL_TABLET | Freq: Every day | ORAL | Status: DC
  Administered 2014-01-05: 5 mg via ORAL
  Filled 2014-01-05 (×2): qty 1

## 2014-01-05 NOTE — Progress Notes (Addendum)
CARDIAC REHAB PHASE I   PRE:  Rate/Rhythm: 73 SR  BP:  Supine:   Sitting: 150/100  Standing:    SaO2: 97 RA  MODE:  Ambulation: 550 ft   POST:  Rate/Rhythm: 76  BP:  Supine:   Sitting: 160/100  Standing:    SaO2: 97 RA 1040-1115 Assisted X 1 and used walker to ambulate. Gait steady, slow pace. BP elevated before and after walk. Pt to recliner after walk with call light in reach. Encouraged use of IS hourly.  Rodney Langton RN 01/05/2014 11:15 AM

## 2014-01-05 NOTE — Progress Notes (Signed)
Patient back in normal sinus rhythm

## 2014-01-05 NOTE — Progress Notes (Addendum)
[address]       [place] [address]             [phone]      4 Days Post-Op Procedure(s) (LRB): CORONARY ARTERY BYPASS GRAFTING (CABG) times four using left internal mammary artery and right saphenous vein (N/A) INTRAOPERATIVE TRANSESOPHAGEAL ECHOCARDIOGRAM (N/A) Subjective: Feels ok, had afib with RVR- now in sinus on IV amio  Objective: Vital signs in last 24 hours: Temp:  [98.1 F (36.7 C)-99.8 F (37.7 C)] 98.7 F (37.1 C) (11/17 0355) Pulse Rate:  [74-132] 74 (11/17 0355) Cardiac Rhythm:  [-] Atrial fibrillation (11/16 2230) Resp:  [17-27] 18 (11/17 0355) BP: (136-170)/(79-98) 146/89 mmHg (11/17 0355) SpO2:  [94 %-99 %] 99 % (11/17 0355) Weight:  [224 lb 13.9 oz (102 kg)] 224 lb 13.9 oz (102 kg) (11/17 0355)  Hemodynamic parameters for last 24 hours:    Intake/Output from previous day: 11/16 0701 - 11/17 0700 In: 1080 [P.O.:1080] Out: 1350 [Urine:1350] Intake/Output this shift:    General appearance: alert, cooperative and no distress Heart: regular rate and rhythm Lungs: dim in lower fields Abdomen: benign Extremities: min edema Wound: incis healing well  Lab Results:  Recent Labs  01/03/14 0410 01/04/14 0305  WBC 14.5* 13.1*  HGB 11.0* 11.1*  HCT 34.1* 34.6*  PLT 175 191   BMET:  Recent Labs  01/03/14 0410 01/04/14 0305  NA 134* 139  K 4.3 4.3  CL 96 99  CO2 26 25  GLUCOSE 99 104*  BUN 29* 29*  CREATININE 2.61* 2.29*  CALCIUM 9.0 8.9    PT/INR: No results for input(s): LABPROT, INR in the last 72 hours. ABG    Component Value Date/Time   PHART 7.310* 01/01/2014 1940   HCO3 23.6 01/01/2014 1940   TCO2 26 01/02/2014 1633   ACIDBASEDEF 3.0* 01/01/2014 1940   O2SAT 95.0 01/01/2014 1940   CBG (last 3)   Recent Labs  01/03/14 0840 01/03/14 1212 01/03/14 1629  GLUCAP 106* 100* 95    Meds Scheduled Meds: . acetaminophen  1,000 mg Oral 4 times per day  . antiseptic oral rinse  7 mL Mouth Rinse BID    . aspirin EC  325 mg Oral Daily  . atorvastatin  40 mg Oral q1800  . bisacodyl  10 mg Oral Daily   Or  . bisacodyl  10 mg Rectal Daily  . docusate sodium  200 mg Oral Daily  . furosemide  40 mg Oral BID  . metoprolol tartrate  25 mg Oral BID  . pantoprazole  40 mg Oral Daily  . sodium chloride  3 mL Intravenous Q12H   Continuous Infusions: . sodium chloride Stopped (01/03/14 1000)  . amiodarone 30 mg/hr (01/05/14 0656)   PRN Meds:.sodium chloride, hydrALAZINE, ondansetron (ZOFRAN) IV, oxyCODONE, sodium chloride, traMADol  Xrays Dg Chest 2 View  01/04/2014   CLINICAL DATA:  Status post CABG ; atelectasis ; history of CHF  EXAM: CHEST  2 VIEW  COMPARISON:  Portable chest x-ray of January 03, 2014  FINDINGS: The lungs are slightly better inflated today. There is persistent linear density peripherally in the left mid lung. There is no alveolar pneumonia. There is are small bilateral pleural effusions layering posteriorly. The cardiac silhouette is mildly enlarged. The pulmonary vascularity is not engorged. There is 7 intact sternal wires present.  IMPRESSION: There are small bilateral pleural effusions. There is subsegmental atelectasis peripherally in the left mid lung. There is no pulmonary  edema.   Electronically Signed   By: David  Martinique   On: 01/04/2014 07:56    Assessment/Plan: S/P Procedure(s) (LRB): CORONARY ARTERY BYPASS GRAFTING (CABG) times four using left internal mammary artery and right saphenous vein (N/A) INTRAOPERATIVE TRANSESOPHAGEAL ECHOCARDIOGRAM (N/A)   1 afib- sinus- cont amio 2 HTN- Creat elevated so will hold on ACE, add norvasc 3 cont to diurese 4 recheck labs in am  5 push rehab/'pulm toilet  LOS: 10 days    GOLD,WAYNE E 01/05/2014   Chart reviewed, patient examined, agree with above. Will switch amio to po since he only has a peripheral IV and quickly went back into sinus.

## 2014-01-06 LAB — CBC
HCT: 33 % — ABNORMAL LOW (ref 39.0–52.0)
HEMOGLOBIN: 10.7 g/dL — AB (ref 13.0–17.0)
MCH: 26.9 pg (ref 26.0–34.0)
MCHC: 32.4 g/dL (ref 30.0–36.0)
MCV: 82.9 fL (ref 78.0–100.0)
PLATELETS: 281 10*3/uL (ref 150–400)
RBC: 3.98 MIL/uL — AB (ref 4.22–5.81)
RDW: 14.6 % (ref 11.5–15.5)
WBC: 10.5 10*3/uL (ref 4.0–10.5)

## 2014-01-06 LAB — BASIC METABOLIC PANEL
Anion gap: 14 (ref 5–15)
BUN: 27 mg/dL — ABNORMAL HIGH (ref 6–23)
CHLORIDE: 100 meq/L (ref 96–112)
CO2: 24 meq/L (ref 19–32)
CREATININE: 2.09 mg/dL — AB (ref 0.50–1.35)
Calcium: 9.1 mg/dL (ref 8.4–10.5)
GFR calc Af Amer: 41 mL/min — ABNORMAL LOW (ref >90)
GFR calc non Af Amer: 36 mL/min — ABNORMAL LOW (ref >90)
Glucose, Bld: 100 mg/dL — ABNORMAL HIGH (ref 70–99)
Potassium: 4.2 mEq/L (ref 3.7–5.3)
SODIUM: 138 meq/L (ref 137–147)

## 2014-01-06 MED ORDER — AMLODIPINE BESYLATE 10 MG PO TABS
10.0000 mg | ORAL_TABLET | Freq: Every day | ORAL | Status: DC
  Administered 2014-01-06 – 2014-01-07 (×2): 10 mg via ORAL
  Filled 2014-01-06 (×2): qty 1

## 2014-01-06 NOTE — Discharge Instructions (Signed)
Endoscopic Saphenous Vein Harvesting °Care After °Refer to this sheet in the next few weeks. These instructions provide you with information on caring for yourself after your procedure. Your health care provider may also give you more specific instructions. Your treatment has been planned according to current medical practices, but problems sometimes occur. Call your health care provider if you have any problems or questions after your procedure. °HOME CARE INSTRUCTIONS °Medicine °· Take whatever pain medicine your surgeon prescribes. Follow the directions carefully. Do not take over-the-counter pain medicine unless your surgeon says it is okay. Some pain medicine can cause bleeding problems for several weeks after surgery. °· Follow your surgeon's instructions about driving. You will probably not be permitted to drive after heart surgery. °· Take any medicines your surgeon prescribes. Any medicines you took before your heart surgery should be checked with your health care provider before you start taking them again. °Wound care °· If your surgeon has prescribed an elastic bandage or stocking, ask how long you should wear it. °· Check the area around your surgical cuts (incisions) whenever your bandages (dressings) are changed. Look for any redness or swelling. °· You will need to return to have the stitches (sutures) or staples taken out. Ask your surgeon when to do that. °· Ask your surgeon when you can shower or bathe. °Activity °· Try to keep your legs raised when you are sitting. °· Do any exercises your health care providers have given you. These may include deep breathing exercises, coughing, walking, or other exercises. °SEEK MEDICAL CARE IF: °· You have any questions about your medicines. °· You have more leg pain, especially if your pain medicine stops working. °· New or growing bruises develop on your leg. °· Your leg swells, feels tight, or becomes red. °· You have numbness in your leg. °SEEK IMMEDIATE  MEDICAL CARE IF: °· Your pain gets much worse. °· Blood or fluid leaks from any of the incisions. °· Your incisions become warm, swollen, or red. °· You have chest pain. °· You have trouble breathing. °· You have a fever. °· You have more pain near your leg incision. °MAKE SURE YOU: °· Understand these instructions. °· Will watch your condition. °· Will get help right away if you are not doing well or get worse. °Document Released: 10/18/2010 Document Revised: 02/10/2013 Document Reviewed: 10/18/2010 °Exit[employer]® Patient Information ©2015 [employer]. This information is not intended to replace advice given to you by your health care provider. Make sure you discuss any questions you have with your health care provider. °Coronary Artery Bypass Grafting, Care After °These instructions give you information on caring for yourself after your procedure. Your doctor may also give you more specific instructions. Call your doctor if you have any problems or questions after your procedure.  °HOME CARE °· Only take medicine as told by your doctor. Take medicines exactly as told. Do not stop taking medicines or start any new medicines without talking to your doctor first. °· Take your pulse as told by your doctor. °· Do deep breathing as told by your doctor. Use your breathing device (incentive spirometer), if given, to practice deep breathing several times a day. Support your chest with a pillow or your arms when you take deep breaths or cough. °· Keep the area clean, dry, and protected where the surgery cuts (incisions) were made. Remove bandages (dressings) only as told by your doctor. If strips were applied to surgical area, do not take them off. They fall off   on their own. °· Check the surgery area daily for puffiness (swelling), redness, or leaking fluid. °· If surgery cuts were made in your legs: °· Avoid crossing your legs. °· Avoid sitting for long periods of time. Change positions every 30 minutes. °· Raise your legs  when you are sitting. Place them on pillows. °· Wear stockings that help keep blood clots from forming in your legs (compression stockings). °· Only take sponge baths until your doctor says it is okay to take showers. Pat the surgery area dry. Do not rub the surgery area with a washcloth or towel. Do not bathe, swim, or use a hot tub until your doctor says it is okay. °· Eat foods that are high in fiber. These include raw fruits and vegetables, whole grains, beans, and nuts. Choose lean meats. Avoid canned, processed, and fried foods. °· Drink enough fluids to keep your pee (urine) clear or pale yellow. °· Weigh yourself every day. °· Rest and limit activity as told by your doctor. You may be told to: °· Stop any activity if you have chest pain, shortness of breath, changes in heartbeat, or dizziness. Get help right away if this happens. °· Move around often for short amounts of time or take short walks as told by your doctor. Gradually become more active. You may need help to strengthen your muscles and build endurance. °· Avoid lifting, pushing, or pulling anything heavier than 10 pounds (4.5 kg) for at least 6 weeks after surgery. °· Do not drive until your doctor says it is okay. °· Ask your doctor when you can go back to work. °· Ask your doctor when you can begin sexual activity again. °· Follow up with your doctor as told. °GET HELP IF: °· You have puffiness, redness, more pain, or fluid draining from the incision site. °· You have a fever. °· You have puffiness in your ankles or legs. °· You have pain in your legs. °· You gain 2 or more pounds (0.9 kg) a day. °· You feel sick to your stomach (nauseous) or throw up (vomit). °· You have watery poop (diarrhea). °GET HELP RIGHT AWAY IF: °· You have chest pain that goes to your jaw or arms. °· You have shortness of breath. °· You have a fast or irregular heartbeat. °· You notice a "clicking" in your breastbone when you move. °· You have numbness or weakness in  your arms or legs. °· You feel dizzy or light-headed. °MAKE SURE YOU: °· Understand these instructions. °· Will watch your condition. °· Will get help right away if you are not doing well or get worse. °Document Released: 02/10/2013 Document Reviewed: 02/10/2013 °ExitCare® Patient Information ©2015 [employer]. This information is not intended to replace advice given to you by your health care provider. Make sure you discuss any questions you have with your health care provider. ° °

## 2014-01-06 NOTE — Discharge Summary (Signed)
Physician Discharge Summary  Patient ID: Seth Keller MRN: [id number] DOB/AGE: [id number] y.o.  Admit date: 12/26/2013 Discharge date: 01/18/2014  Admission Diagnoses:Non-STEMI                                        Hypertensive urgency                                         Atrial fibrillation-postoperative  Discharge Diagnoses:  Principal Problem:   Non-ST elevation myocardial infarction (NSTEMI), initial episode of care Active Problems:   Elevated troponin   Acute on chronic kidney failure   Acute systolic CHF (congestive heart failure), NYHA class 4   Cardiomyopathy, ischemic   CAD (coronary artery disease), native coronary artery   S/P CABG x 4   Atrial fibrillation with RVR  Patient Active Problem List   Diagnosis Date Noted  . Atrial fibrillation with RVR   . Constipation 01/11/2014  . S/P CABG x 4 01/01/2014  . Acute systolic CHF (congestive heart failure), NYHA class 4 12/30/2013  . Cardiomyopathy, ischemic 12/30/2013  . CAD (coronary artery disease), native coronary artery 12/30/2013  . Elevated troponin 12/28/2013  . Acute on chronic kidney failure 12/28/2013  . Non-ST elevation myocardial infarction (NSTEMI), initial episode of care 12/26/2013  . Essential hypertension 05/08/2012  . Microhematuria 05/08/2012  . Proteinuria 05/08/2012   HPI: at time of admission  Seth Keller is a 48 y.o. male with no history of CAD. He was diagnosed with hypertension approximately 15 years ago. He was living in [place] at the time. He moved to [place] approximately 2 years ago. He has seen a doctor once since then. He does not take any medications regularly. He states that he made dietary changes and felt those would manage his blood pressure. He does not have a blood pressure cuff at home and does not check his blood pressure. He was smoking up until a few days ago but states he has quit. He has no history of exertional shortness of breath or exertional chest pain.  2 days  prior to admission he had an episode of chest pain that lasted less than 1 minute. It was nonexertional. It was a 4 or 5/10. There was no radiation and no associated symptoms. He has never had chest pain before or since then. iit resolved without intervention.  On the day prior to admission without exertion, he had sudden onset of shortness of breath. There was no associated nausea, vomiting or diaphoresis. His symptoms did not resolve. He took an aspirin and some Advil. The symptoms improved somewhat and he was able to sleep. However, this a.m. The shortness of breath began again shortly after waking and that's when he decided to seek help. He went to the urgent care and was transferred to [facility]. Currently, he denies shortness of breath.  He has no recent illnesses, fevers or chills. He is under some increased emotional stress because of the recent loss of his job. He has a supportive fianc. He will have trouble affording his medications and requests that we use the chief is possible products. He was admitted for evaluation and treatment.   Past Medical History  Diagnosis Date  . Hypertension approx. 2000     Past Surgical History  Procedure Laterality Date  .  None      Allergies  Allergen Reactions  . Shellfish Allergy Anaphylaxis    Throat close up, swelling    I have reviewed the patient's current medications . labetalol 20 mg Intravenous Once   . labetalol (NORMODYNE) infusion 2 mg/min (12/26/13 1108)    Prior to Admission medications   Medication Sig Start Date End Date Taking? Authorizing Provider  ibuprofen (ADVIL,MOTRIN) 200 MG tablet Take 400 mg by mouth 2 (two) times daily as needed for mild pain or moderate pain.   Yes Historical Provider, MD    History   Social History  . Marital Status: Single    Spouse Name: N/A    Number of Children: N/A  . Years of Education: N/A   Occupational  History  . Unemployed    Social History Main Topics  . Smoking status: Former Smoker -- 0.50 packs/day for 20 years    Types: Cigarettes    Quit date: 12/24/2013  . Smokeless tobacco: Never Used  . Alcohol Use: 0.0 oz/week    0 Not specified per week     Comment: socially, 1-2 x year  . Drug Use: No  . Sexual Activity: Yes    Birth Control/ Protection: None   Other Topics Concern  . Not on file   Social History Narrative   Lives with fiance.    Family Status  Relation Status Death Age  . Mother Deceased 90s    "hardening of the arteries", may have died of an MI  . Father Deceased 50s    Hx asthma, no CAD  . Brother Alive     Discharged Condition: good  Hospital Course: the patient did rule in for non-ST segment elevation myocardial infarction. He was stabilized medically. Plans were made for obtaining echocardiogram as well as pursuing cardiac catheterization. This was done on 12/29/2013 and the following results were noted:  Left Heart Catheterization with Coronary Angiography Report  Cian Tribbey  48 y.o.  male 1966/02/04  Procedure Date: 12/29/2013 Referring Physician: Darlina Guys, MD Primary Cardiologist: Angelena Form  INDICATIONS: Acute coronary syndrome, severe hypertension, left ventricular dysfunction  PROCEDURE: 1. Left heart catheterization; 2. Left hemodynamic recordings; 3.Coronary angiography  CONSENT:  The risks, benefits, and details of the procedure were explained in detail to the patient. Risks including death, stroke, heart attack, kidney injury, allergy, limb ischemia, bleeding and radiation injury were discussed. The patient verbalized understanding and wanted to proceed. Informed written consent was obtained.  PROCEDURE TECHNIQUE: After Xylocaine anesthesia a 5 French Slender sheath was placed in the right radial artery with an angiocath and the modified Seldinger  technique. Coronary angiography was done using a 5 F JR4, JL 3.5 cm catheter. Left ventriculography was done using the JR4 catheter and hand injection.   The angiograms demonstrated three-vessel coronary disease. Of the vessels involved the disease in the proximal to mid LAD appeared moderate. For that reason FFR was performed at a point in the mid LAD beyond the proximal and mid disease noted on angiography. Adenosine was infused. FFR was 0.78.  The procedure was terminated and hemostasis achieved with a wrist band.  CONTRAST: Total of 70 cc.  COMPLICATIONS: Dyspnea with the Dennison   HEMODYNAMICS: Aortic pressure 178/107 mmHg; LV pressure 180/24 mmHg; LVEDP 37 mmHg  ANGIOGRAPHIC DATA: The left main coronary artery is widely patent.  The left anterior descending artery is large and wraps around the left ventricular apex. There is a proximal eccentric 70% stenosis and a mid eccentric 60-70% stenosis.  FFR beyond the mid vessel stenosis was 0.78. The apical LAD contains 90% segmental stenosis before the apical segment. The first diagonal is totally occluded.  The left circumflex artery is severely and diffusely diseased. The mid circumflex contains an eccentric 80% stenosis. There is total occlusion of a moderate sized first obtuse marginal that arises beyond an 80% stenosis in the mid vessel. The distal vessel is diffusely diseased with up to 85% stenosis before the margin of the second obtuse marginal and the PDA.Marland Kitchen  The ramus intermedius is moderate in size and contains proximal to mid diffuse disease with up to 80% stenosis. The distal vessel may not be graftable.  The right coronary artery is nondominant. The right coronary is totally occluded in the mid-segment.   LEFT VENTRICULOGRAM: Left ventricular angiogram was not done. Pressure recordings revealed markedly elevated LVEDP.   IMPRESSIONS: 1. Significant three-vessel coronary artery disease with hemodynamically significant  proximal mid LAD stenosis by FFR, significant mid ramus intermedius obstruction, significant circumflex and obtuse marginal disease, and nondominant totally occluded RCA.  2. Chronic kidney disease, stage III  3. Decreased LV function with inferior wall motion abnormality and estimated EF of 45% by echo. Markedly elevated LVEDP   RECOMMENDATION: Aggressive medical therapy to control blood pressure.  IV hydration.  Monitor renal function.  Consider surgical revascularization versus LAD proximal and mid stenting as well as mid circumflex stenting. Heart team approach should probably be used before making final decision..   Due to these findings cardiothoracic surgical consultation was obtained with Gilford Raid M.D.Dr. Cyndia Bent evaluated the patient and his studies and agreed with recommendations to proceed with surgical revascularization.     CARDIOVASCULAR SURGERY OPERATIVE NOTE  01/01/2014  Surgeon: Gaye Pollack, MD  First Assistant: Jadene Pierini, PA-C   Preoperative Diagnosis: Severe multi-vessel coronary artery disease, s/p NSTEMI   Postoperative Diagnosis: Same   Procedure:  1. Median Sternotomy 2. Extracorporeal circulation 3. Coronary artery bypass grafting x 4   Left internal mammary graft to the LAD  SVG to Ramus  Sequential SVG to OM1 and PDA (LCX)  4. Endoscopic vein harvest from the right leg   Anesthesia: General Endotracheal  Postoperative hospital course:  Overall the patient has done quite well. He has maintained stable hemodynamics. Dopamine was weaned without difficulty. He was extubated uneventfully. All routine lines, monitors and drainage devices were discontinued in the standard fashion. He did have some hypertension and medications were adjusted postoperatively. He had some mild volume excess but diuresed well. He had a mild acute blood loss  anemia which stabilized. He has some chronic renal insufficiency which is stabilized to baseline. He is not a candidate for ACE inhibitor at this time. He did have postoperative atrial fibrillation which was chemically cardioverted to normal sinus rhythm with amiodarone. Incisions were noted to be healing well without evidence of infection. He is tolerating diet. He is tolerating routine activities using standard protocols. Overall he was felt to be quite stable for discharge.       Discharge Exam: Blood pressure 163/93, pulse 80, temperature 98.3 F (36.8 C), temperature source Oral, resp. rate 18, height 5\' 9"  (1.753 m), weight 221 lb 9 oz (100.5 kg), SpO2 94 %.  General appearance: alert, cooperative and no distress Heart: regular rate and rhythm Lungs: clear to auscultation bilaterally Abdomen: benign Extremities: no edema Wound: incis healing well  Disposition: 01-Home or Self Care      Discharge Instructions    Amb Referral to Cardiac Rehabilitation  Complete by:  As directed           Medications:    Medication List    STOP taking these medications        ibuprofen 200 MG tablet  Commonly known as:  ADVIL,MOTRIN      TAKE these medications        amiodarone 400 MG tablet  Commonly known as:  PACERONE  Take 1 tablet (400 mg total) by mouth 2 (two) times daily. For one week , then take 400 mg daily     amLODipine 10 MG tablet  Commonly known as:  NORVASC  Take 1 tablet (10 mg total) by mouth daily.     aspirin 325 MG EC tablet  Take 1 tablet (325 mg total) by mouth daily.     atorvastatin 40 MG tablet  Commonly known as:  LIPITOR  Take 1 tablet (40 mg total) by mouth daily at 6 PM.     hydrochlorothiazide 12.5 MG capsule  Commonly known as:  MICROZIDE  Take 2 capsules (25 mg total) by mouth daily.     metoprolol tartrate 25 MG tablet  Commonly known as:  LOPRESSOR  Take 1 tablet (25 mg total) by mouth 2 (two) times daily.     oxyCODONE 5 MG  immediate release tablet  Commonly known as:  Oxy IR/ROXICODONE  Take 1-2 tablets (5-10 mg total) by mouth every 4 (four) hours as needed for severe pain.       Follow-up Information    Follow up with [facility]    .   Contact information:   [address] [place] [phone]      Follow up with Gaye Pollack, MD.   Specialty:  Cardiothoracic Surgery   Why:  02/03/2014 at 4:30 PM. Please obtain a chest x-ray at 3:30 PM at [facility] which is located in the same office complex.   Contact information:   [address] [address] [phone]       Follow up with [facility].   Why:  The nurse and Dr. Vivi Martens office 01/13/2014 at 10 AM      Follow up with Murray Hodgkins, NP.   Specialty:  Nurse Practitioner   Why:  01/26/2014 at 9:30 AM for cardiology follow-up.   Contact information:   [id number] [place] [address] [phone]      The patient has been discharged on:   1.Beta Blocker:  Yes [ y  ]                              No   [   ]                              If No, reason:  2.Ace Inhibitor/ARB: Yes [   ]                                     No  [ y   ]                                     If No, reason:elevated creat  3.Statin:   Yes Blue.Reese   ]                  No  [   ]                  If No, reason:  4.Ecasa:  Yes  Blue.Reese   ]                  No   [   ]                  If No, reason:  Signed: GOLD,WAYNE E 01/18/2014, 10:21 AM

## 2014-01-06 NOTE — Progress Notes (Signed)
CARDIAC REHAB PHASE I   PRE:  Rate/Rhythm: 81 SR    BP: sitting 140/100    SaO2: 98 RA  MODE:  Ambulation: 750 ft   POST:  Rate/Rhythm: 89 SR    BP: sitting 160/110     SaO2: 99 RA  Pt ambulated without RW. Small steps, fairly steady. He would like to get RW if possible. BP very elevated. To EOB, encouraged pt to elevate his feet later.  [id number]   Josephina Shih Hyrum CES, ACSM 01/06/2014 11:34 AM

## 2014-01-06 NOTE — Progress Notes (Signed)
5 Days Post-Op Procedure(s) (LRB): CORONARY ARTERY BYPASS GRAFTING (CABG) times four using left internal mammary artery and right saphenous vein (N/A) INTRAOPERATIVE TRANSESOPHAGEAL ECHOCARDIOGRAM (N/A) Subjective:  No complaints. Walked this am. He is up at the sink washing up. Feels stronger.  Objective: Vital signs in last 24 hours: Temp:  [98.1 F (36.7 C)-98.7 F (37.1 C)] 98.4 F (36.9 C) (11/18 0405) Pulse Rate:  [66-80] 80 (11/18 0405) Cardiac Rhythm:  [-] Normal sinus rhythm (11/17 1950) Resp:  [18] 18 (11/18 0405) BP: (94-163)/(68-85) 148/82 mmHg (11/18 0614) SpO2:  [95 %-100 %] 100 % (11/18 0405) Weight:  [103 kg (227 lb 1.2 oz)] 103 kg (227 lb 1.2 oz) (11/18 0405)  Hemodynamic parameters for last 24 hours:    Intake/Output from previous day: 11/17 0701 - 11/18 0700 In: 840 [P.O.:840] Out: 1000 [Urine:1000] Intake/Output this shift:    General appearance: alert and cooperative Heart: regular rate and rhythm, S1, S2 normal, no murmur, click, rub or gallop Lungs: clear to auscultation bilaterally Extremities: extremities normal, atraumatic, no cyanosis or edema Wound: incision ok  Lab Results:  Recent Labs  01/04/14 0305 01/06/14 0342  WBC 13.1* 10.5  HGB 11.1* 10.7*  HCT 34.6* 33.0*  PLT 191 281   BMET:  Recent Labs  01/04/14 0305 01/06/14 0342  NA 139 138  K 4.3 4.2  CL 99 100  CO2 25 24  GLUCOSE 104* 100*  BUN 29* 27*  CREATININE 2.29* 2.09*  CALCIUM 8.9 9.1    PT/INR: No results for input(s): LABPROT, INR in the last 72 hours. ABG    Component Value Date/Time   PHART 7.310* 01/01/2014 1940   HCO3 23.6 01/01/2014 1940   TCO2 26 01/02/2014 1633   ACIDBASEDEF 3.0* 01/01/2014 1940   O2SAT 95.0 01/01/2014 1940   CBG (last 3)   Recent Labs  01/03/14 1212 01/03/14 1629  GLUCAP 100* 95    Assessment/Plan: S/P Procedure(s) (LRB): CORONARY ARTERY BYPASS GRAFTING (CABG) times four using left internal mammary artery and right  saphenous vein (N/A) INTRAOPERATIVE TRANSESOPHAGEAL ECHOCARDIOGRAM (N/A)  Hypertension: will increase Norvasc to 10 mg. Will probably need higher dose Lopressor. Postop atrial fib: maintaining sinus on amio. Will continue tid today and plan 400 bid for a week at discharge with taper to 200 bid. Expected postop acute blood loss anemia: stable. Stage III CKD: creat at preop baseline. Mobilize Diabetes control: glucose under good control. Preop Hgb A1c 5.7. He needs dietary education. Plan home tomorrow if no changes.   LOS: 11 days    BARTLE,BRYAN K 01/06/2014

## 2014-01-06 NOTE — Plan of Care (Signed)
Problem: Phase III Progression Outcomes Goal: No anginal pain Outcome: Progressing

## 2014-01-07 ENCOUNTER — Telehealth: Payer: Self-pay | Admitting: *Deleted

## 2014-01-07 MED ORDER — HYDROCHLOROTHIAZIDE 12.5 MG PO CAPS: 25.0000 mg | ORAL_CAPSULE | Freq: Every day | ORAL | Status: DC

## 2014-01-07 MED ORDER — ATORVASTATIN CALCIUM 40 MG PO TABS: 40.0000 mg | ORAL_TABLET | Freq: Every day | ORAL | Status: DC

## 2014-01-07 MED ORDER — HYDROCHLOROTHIAZIDE 25 MG PO TABS
25.0000 mg | ORAL_TABLET | Freq: Every day | ORAL | Status: DC
  Filled 2014-01-07: qty 1

## 2014-01-07 MED ORDER — HYDROCHLOROTHIAZIDE 12.5 MG PO CAPS
25.0000 mg | ORAL_CAPSULE | Freq: Every day | ORAL | Status: DC
  Filled 2014-01-07: qty 2

## 2014-01-07 MED ORDER — OXYCODONE HCL 5 MG PO TABS: 5.0000 mg | ORAL_TABLET | ORAL | Status: DC | PRN

## 2014-01-07 MED ORDER — METOPROLOL TARTRATE 25 MG PO TABS: 25.0000 mg | ORAL_TABLET | Freq: Two times a day (BID) | ORAL | Status: DC

## 2014-01-07 MED ORDER — ASPIRIN 325 MG PO TBEC: 325.0000 mg | DELAYED_RELEASE_TABLET | Freq: Every day | ORAL | Status: DC

## 2014-01-07 MED ORDER — AMLODIPINE BESYLATE 10 MG PO TABS: 10.0000 mg | ORAL_TABLET | Freq: Every day | ORAL | Status: DC

## 2014-01-07 MED ORDER — AMIODARONE HCL 400 MG PO TABS: 400.0000 mg | ORAL_TABLET | Freq: Two times a day (BID) | ORAL | Status: DC

## 2014-01-07 NOTE — Progress Notes (Addendum)
      [address]       [place] [address]             [phone]      6 Days Post-Op Procedure(s) (LRB): CORONARY ARTERY BYPASS GRAFTING (CABG) times four using left internal mammary artery and right saphenous vein (N/A) INTRAOPERATIVE TRANSESOPHAGEAL ECHOCARDIOGRAM (N/A) Subjective: Feels well  Objective: Vital signs in last 24 hours: Temp:  [98 F (36.7 C)-98.4 F (36.9 C)] 98.3 F (36.8 C) (11/19 0500) Pulse Rate:  [76-82] 80 (11/19 0500) Cardiac Rhythm:  [-] Normal sinus rhythm (11/18 0815) Resp:  [18-20] 18 (11/19 0500) BP: (119-176)/(61-97) 163/93 mmHg (11/19 0500) SpO2:  [94 %-100 %] 94 % (11/19 0500) Weight:  [221 lb 9 oz (100.5 kg)] 221 lb 9 oz (100.5 kg) (11/19 0500)  Hemodynamic parameters for last 24 hours:    Intake/Output from previous day: 11/18 0701 - 11/19 0700 In: 1080 [P.O.:1080] Out: 1950 [Urine:1950] Intake/Output this shift:    General appearance: alert, cooperative and no distress Heart: regular rate and rhythm Lungs: clear to auscultation bilaterally Abdomen: benign Extremities: no edema Wound: incis healing well  Lab Results:  Recent Labs  01/06/14 0342  WBC 10.5  HGB 10.7*  HCT 33.0*  PLT 281   BMET:  Recent Labs  01/06/14 0342  NA 138  K 4.2  CL 100  CO2 24  GLUCOSE 100*  BUN 27*  CREATININE 2.09*  CALCIUM 9.1    PT/INR: No results for input(s): LABPROT, INR in the last 72 hours. ABG    Component Value Date/Time   PHART 7.310* 01/01/2014 1940   HCO3 23.6 01/01/2014 1940   TCO2 26 01/02/2014 1633   ACIDBASEDEF 3.0* 01/01/2014 1940   O2SAT 95.0 01/01/2014 1940   CBG (last 3)  No results for input(s): GLUCAP in the last 72 hours.  Meds Scheduled Meds: . amiodarone  400 mg Oral TID  . amLODipine  10 mg Oral Daily  . antiseptic oral rinse  7 mL Mouth Rinse BID  . aspirin EC  325 mg Oral Daily  . atorvastatin  40 mg Oral q1800  . bisacodyl  10 mg Oral Daily   Or  . bisacodyl  10 mg Rectal  Daily  . docusate sodium  200 mg Oral Daily  . metoprolol tartrate  25 mg Oral BID  . pantoprazole  40 mg Oral Daily  . sodium chloride  3 mL Intravenous Q12H   Continuous Infusions: . sodium chloride Stopped (01/03/14 1000)   PRN Meds:.sodium chloride, hydrALAZINE, ondansetron (ZOFRAN) IV, oxyCODONE, sodium chloride, traMADol  Xrays No results found.  Assessment/Plan: S/P Procedure(s) (LRB): CORONARY ARTERY BYPASS GRAFTING (CABG) times four using left internal mammary artery and right saphenous vein (N/A) INTRAOPERATIVE TRANSESOPHAGEAL ECHOCARDIOGRAM (N/A) d/c pacing wires Plan for discharge: see discharge orders Will add HCTZ for BP control   LOS: 12 days    Keller,Seth E 01/07/2014   Chart reviewed, patient examined, agree with above. He looks good and can go home today. He will need continued titration of BP meds as an outpt.

## 2014-01-07 NOTE — Progress Notes (Signed)
[id number] Cardiac Rehab Completed discharge education with pt. He voices understanding. Pt agrees to [place]. CRP in [place], will send referral. I discussed smoking cessation with pt. He plans to quit "cold [place]." I gave him tips for quitting, coaching contact number and quit smart class information. Pt seems very motivated to making lifestyle changes. Deon Pilling, RN 01/07/2014 10:21 AM

## 2014-01-07 NOTE — Progress Notes (Signed)
EPW removed per orders and per protocol. Patient tolerated well and wires intact. Patient instructed to remain in bed x 1 hour and to call RN with any concerns or questions. Patient verbalized understanding. Call bell given to patient along with heart pillow.  Alfredo Bach RN BSN 01/07/2014 8:33 AM

## 2014-01-07 NOTE — Telephone Encounter (Signed)
Seth Keller was discharged today and was unable to afford his Amiodarone.  He is to have follow up care at the [facility].  I called them and they have this med and will fill his prescription at a very minimal cost.  I called and informed him and he said his fiance would have it filled today.

## 2014-01-11 ENCOUNTER — Ambulatory Visit: Payer: Medicaid Other | Attending: Family Medicine | Admitting: Family Medicine

## 2014-01-11 ENCOUNTER — Ambulatory Visit (HOSPITAL_BASED_OUTPATIENT_CLINIC_OR_DEPARTMENT_OTHER): Payer: Medicaid Other | Admitting: *Deleted

## 2014-01-11 ENCOUNTER — Encounter: Payer: Self-pay | Admitting: Family Medicine

## 2014-01-11 VITALS — BP 120/80 | HR 68 | Temp 98.0°F | Resp 18 | Ht 69.0 in | Wt 225.0 lb

## 2014-01-11 DIAGNOSIS — Z418 Encounter for other procedures for purposes other than remedying health state: Secondary | ICD-10-CM

## 2014-01-11 DIAGNOSIS — Z951 Presence of aortocoronary bypass graft: Secondary | ICD-10-CM | POA: Insufficient documentation

## 2014-01-11 DIAGNOSIS — I10 Essential (primary) hypertension: Secondary | ICD-10-CM

## 2014-01-11 DIAGNOSIS — T402X5A Adverse effect of other opioids, initial encounter: Secondary | ICD-10-CM | POA: Insufficient documentation

## 2014-01-11 DIAGNOSIS — I2510 Atherosclerotic heart disease of native coronary artery without angina pectoris: Secondary | ICD-10-CM | POA: Insufficient documentation

## 2014-01-11 DIAGNOSIS — Z23 Encounter for immunization: Secondary | ICD-10-CM

## 2014-01-11 DIAGNOSIS — K5909 Other constipation: Secondary | ICD-10-CM | POA: Insufficient documentation

## 2014-01-11 DIAGNOSIS — I160 Hypertensive urgency: Secondary | ICD-10-CM

## 2014-01-11 DIAGNOSIS — I129 Hypertensive chronic kidney disease with stage 1 through stage 4 chronic kidney disease, or unspecified chronic kidney disease: Secondary | ICD-10-CM | POA: Insufficient documentation

## 2014-01-11 DIAGNOSIS — Z87891 Personal history of nicotine dependence: Secondary | ICD-10-CM | POA: Insufficient documentation

## 2014-01-11 DIAGNOSIS — K5901 Slow transit constipation: Secondary | ICD-10-CM

## 2014-01-11 DIAGNOSIS — N179 Acute kidney failure, unspecified: Secondary | ICD-10-CM | POA: Insufficient documentation

## 2014-01-11 DIAGNOSIS — I5021 Acute systolic (congestive) heart failure: Secondary | ICD-10-CM

## 2014-01-11 DIAGNOSIS — I214 Non-ST elevation (NSTEMI) myocardial infarction: Secondary | ICD-10-CM | POA: Insufficient documentation

## 2014-01-11 DIAGNOSIS — K5900 Constipation, unspecified: Secondary | ICD-10-CM | POA: Insufficient documentation

## 2014-01-11 DIAGNOSIS — Z299 Encounter for prophylactic measures, unspecified: Secondary | ICD-10-CM

## 2014-01-11 DIAGNOSIS — N189 Chronic kidney disease, unspecified: Secondary | ICD-10-CM | POA: Insufficient documentation

## 2014-01-11 MED ORDER — DOCUSATE SODIUM 100 MG PO CAPS: 100.0000 mg | ORAL_CAPSULE | Freq: Two times a day (BID) | ORAL | Status: DC

## 2014-01-11 NOTE — Assessment & Plan Note (Signed)
resolved 

## 2014-01-11 NOTE — Progress Notes (Signed)
Establish Care HFU Surgery, Open heart surgery Stated feeling food, soreness aground chest area

## 2014-01-11 NOTE — Assessment & Plan Note (Signed)
A: HTN induced, also likely some prerenal disease P:  F/u CMP next week  Continue tight BP control

## 2014-01-11 NOTE — Patient Instructions (Signed)
Seth Keller,  Thank you for coming in today. It was a pleasure meeting you. I look forward to being your primary doctor.   1. I have added a stool softener to your medication regimen. Warm prune juice is helpful for constipation. The oxycodone is the most likely culprit.  2. F/u next week for blood work- make a lab appointment at checkout.   Be sure to apply for the [facility] discount and orange card- ask about the applications at the front desk.   Schedule f/u in 6-8 weeks for physical.   Dr. Adrian Blackwater

## 2014-01-11 NOTE — Progress Notes (Signed)
   Subjective:    Patient ID: Seth Keller, male    DOB: 14-Nov-1965, 48 y.o.   MRN: [id number] CC: establish care, HFU for NSTEMI, CAD, s/o CABG x 4  HPI 48 yo M presents with his fiance to establish care:  1. CAD: s/p CABG. No CP or SOB. Compliant with and tolerating medication. Taking oxycodone at night for post op pain if needed. Having some constipation.   2. HTN nephropathy: Cr elevated during hospitalization. Urine output normal. No hematuria.   Soc Hx: former smoker quit 12/24/2013  Med Hx: HTN dx in 2000  Fam Hx:  CAD in mother age 68  Review of Systems As per HPI     Objective:   Physical Exam BP 120/80 mmHg  Pulse 68  Temp(Src) 98 F (36.7 C) (Oral)  Resp 18  Ht 5\' 9"  (1.753 m)  Wt 225 lb (102.059 kg)  BMI 33.21 kg/m2  SpO2 99% General appearance: alert, cooperative and no distress Lungs: clear to auscultation bilaterally  Chest: healing midline sternotomy scar, 3 horizontal scars upper abdomen also healing, non tender, no exudate Heart: regular rate and rhythm, S1, S2 normal, no murmur, click, rub or gallop Extremities: extremities normal, atraumatic, no cyanosis or edema  HM: flu shot and Tdap given today      Assessment & Plan:

## 2014-01-11 NOTE — Assessment & Plan Note (Signed)
A: BP normal.  Meds: compliant and tolerating P: Continue current regimen

## 2014-01-11 NOTE — Assessment & Plan Note (Signed)
A: oxycodone induced constipation P: Colace prescribed

## 2014-01-13 ENCOUNTER — Ambulatory Visit: Payer: Self-pay

## 2014-01-13 DIAGNOSIS — Z951 Presence of aortocoronary bypass graft: Secondary | ICD-10-CM

## 2014-01-13 DIAGNOSIS — Z4802 Encounter for removal of sutures: Secondary | ICD-10-CM

## 2014-01-13 NOTE — Progress Notes (Signed)
Removed 3 sutures from chest tube sites. No signs of infection and patient tolerated well. 

## 2014-01-18 ENCOUNTER — Encounter (HOSPITAL_COMMUNITY): Payer: Self-pay | Admitting: Surgical

## 2014-01-18 DIAGNOSIS — I4891 Unspecified atrial fibrillation: Secondary | ICD-10-CM | POA: Diagnosis present

## 2014-01-22 ENCOUNTER — Encounter: Payer: Self-pay | Admitting: *Deleted

## 2014-01-26 ENCOUNTER — Ambulatory Visit (INDEPENDENT_AMBULATORY_CARE_PROVIDER_SITE_OTHER): Payer: Medicaid Other | Admitting: Nurse Practitioner

## 2014-01-26 ENCOUNTER — Other Ambulatory Visit: Payer: Self-pay | Admitting: *Deleted

## 2014-01-26 ENCOUNTER — Encounter: Payer: Self-pay | Admitting: Nurse Practitioner

## 2014-01-26 VITALS — BP 140/80 | HR 64 | Ht 69.0 in | Wt 218.0 lb

## 2014-01-26 DIAGNOSIS — I255 Ischemic cardiomyopathy: Secondary | ICD-10-CM | POA: Insufficient documentation

## 2014-01-26 DIAGNOSIS — N183 Chronic kidney disease, stage 3 unspecified: Secondary | ICD-10-CM | POA: Insufficient documentation

## 2014-01-26 DIAGNOSIS — I222 Subsequent non-ST elevation (NSTEMI) myocardial infarction: Secondary | ICD-10-CM

## 2014-01-26 DIAGNOSIS — F17201 Nicotine dependence, unspecified, in remission: Secondary | ICD-10-CM | POA: Insufficient documentation

## 2014-01-26 DIAGNOSIS — I5042 Chronic combined systolic (congestive) and diastolic (congestive) heart failure: Secondary | ICD-10-CM | POA: Insufficient documentation

## 2014-01-26 DIAGNOSIS — I2510 Atherosclerotic heart disease of native coronary artery without angina pectoris: Secondary | ICD-10-CM

## 2014-01-26 DIAGNOSIS — I25708 Atherosclerosis of coronary artery bypass graft(s), unspecified, with other forms of angina pectoris: Secondary | ICD-10-CM | POA: Insufficient documentation

## 2014-01-26 DIAGNOSIS — I214 Non-ST elevation (NSTEMI) myocardial infarction: Secondary | ICD-10-CM

## 2014-01-26 DIAGNOSIS — Z720 Tobacco use: Secondary | ICD-10-CM

## 2014-01-26 DIAGNOSIS — I10 Essential (primary) hypertension: Secondary | ICD-10-CM | POA: Insufficient documentation

## 2014-01-26 LAB — BASIC METABOLIC PANEL
BUN: 31 mg/dL — AB (ref 6–23)
CALCIUM: 9.4 mg/dL (ref 8.4–10.5)
CO2: 26 mEq/L (ref 19–32)
Chloride: 103 mEq/L (ref 96–112)
Creatinine, Ser: 2.6 mg/dL — ABNORMAL HIGH (ref 0.4–1.5)
GFR: 34.4 mL/min — ABNORMAL LOW (ref >60.00)
GLUCOSE: 108 mg/dL — AB (ref 70–99)
Potassium: 3.6 mEq/L (ref 3.5–5.1)
Sodium: 138 mEq/L (ref 135–145)

## 2014-01-26 MED ORDER — CLOPIDOGREL BISULFATE 75 MG PO TABS: 75.0000 mg | ORAL_TABLET | Freq: Every day | ORAL | Status: DC

## 2014-01-26 MED ORDER — NITROGLYCERIN 0.4 MG SL SUBL: 0.4000 mg | SUBLINGUAL_TABLET | SUBLINGUAL | Status: DC | PRN

## 2014-01-26 MED ORDER — ASPIRIN EC 81 MG PO TBEC: 81.0000 mg | DELAYED_RELEASE_TABLET | Freq: Every day | ORAL | Status: DC

## 2014-01-26 NOTE — Progress Notes (Signed)
Patient Name: Seth Keller Date of Encounter: 01/26/2014  Primary Care Provider:  Minerva Ends, MD Primary Cardiologist:  C. Angelena Form, MD  Patient Profile  48 y/o male s/p recent cabg who presents for f/u.  Problem List   Past Medical History  Diagnosis Date  . Hypertension     a. Dx ~ 2000.  Marland Kitchen Atrial fibrillation with RVR     a. 12/2013 post-op CABG ->on amio.  Marland Kitchen CAD (coronary artery disease)     a. 12/2013 NSTEMI/Cath: 3VD;  b. 12/2013 CABG x 4: LIMA->LAD, VG->RI, VG->OM1->LPDA.  . Ischemic cardiomyopathy     a. 12/2013 Echo: EF 45%, Gr 2 DD, basal-mid inflat and inf AK, mild MR.  . Chronic combined systolic and diastolic CHF (congestive heart failure)     a. 12/2013 Echo: EF 45%, Gr 2 DD.  Marland Kitchen CKD (chronic kidney disease), stage III   . Tobacco abuse    Past Surgical History  Procedure Laterality Date  . None    . Coronary artery bypass graft N/A 01/01/2014    Procedure: CORONARY ARTERY BYPASS GRAFTING (CABG) times four using left internal mammary artery and right saphenous vein;  Surgeon: Gaye Pollack, MD;  Location: [place] OR;  Service: Open Heart Surgery;  Laterality: N/A;  . Intraoperative transesophageal echocardiogram N/A 01/01/2014    Procedure: INTRAOPERATIVE TRANSESOPHAGEAL ECHOCARDIOGRAM;  Surgeon: Gaye Pollack, MD;  Location: [facility] OR;  Service: Open Heart Surgery;  Laterality: N/A;    Allergies  Allergies  Allergen Reactions  . Shellfish Allergy Anaphylaxis    Throat close up, swelling    HPI  48 y/o male with the above problem list.  He was recently admitted to [facility] with chest pain and dyspnea and ruled in for NSTEMI.  Cath showed severe 3VD.  Echo showed LV dysfxn with an EF of 45%.  He underwent CABG x 4.  Post-op course was complicated by afib and he was placed on amio with conversion to sinus rhythm.  Since d/c, he has been doing well.  He's quit smoking and he and his wife have changed their diets to include leaner meats, whole grains, and more  vegetables.  He has been slowly getting back into his activity routine.  He has not had c/p or dyspnea but does tire easily with more prolonged walking.  He denies palpitations, dyspnea, pnd, orthopnea, n, v, dizziness, syncope, edema, weight gain, or early satiety.  He is tolerating his meds well.  He checks his BP regularly and it has been running in the mid-120's to mid 130's.   Home Medications  Prior to Admission medications   Medication Sig Start Date End Date Taking? Authorizing Provider  amiodarone (PACERONE) 400 MG tablet Take 1 tablet (400 mg total) by mouth 2 (two) times daily. For one week , then take 400 mg daily 01/07/14   Wayne E Gold, PA-C  amLODipine (NORVASC) 10 MG tablet Take 1 tablet (10 mg total) by mouth daily. 01/07/14   John Giovanni, PA-C  aspirin EC 325 MG EC tablet Take 1 tablet (325 mg total) by mouth daily. 01/07/14   Wayne E Gold, PA-C  atorvastatin (LIPITOR) 40 MG tablet Take 1 tablet (40 mg total) by mouth daily at 6 PM. 01/07/14   Wayne E Gold, PA-C  docusate sodium (COLACE) 100 MG capsule Take 1 capsule (100 mg total) by mouth 2 (two) times daily. 01/11/14   Josalyn C Funches, MD  hydrochlorothiazide (MICROZIDE) 12.5 MG capsule Take 2 capsules (25  mg total) by mouth daily. 01/07/14   Wayne E Gold, PA-C  metoprolol tartrate (LOPRESSOR) 25 MG tablet Take 1 tablet (25 mg total) by mouth 2 (two) times daily. 01/07/14   Wayne E Gold, PA-C  oxyCODONE (OXY IR/ROXICODONE) 5 MG immediate release tablet Take 1-2 tablets (5-10 mg total) by mouth every 4 (four) hours as needed for severe pain. 01/07/14   John Giovanni, PA-C    Review of Systems  Overall doing well as above.  He denies chest pain, palpitations, dyspnea, pnd, orthopnea, n, v, dizziness, syncope, edema, weight gain, or early satiety.  All other systems reviewed and are otherwise negative except as noted above.  Physical Exam  Blood pressure 140/80, pulse 64, height 5\' 9"  (1.753 m), weight 218 lb (98.884 kg),  SpO2 98 %.  General: Pleasant, NAD Psych: Normal affect. Neuro: Alert and oriented X 3. Moves all extremities spontaneously. HEENT: Normal  Neck: Supple without bruits or JVD. Lungs:  Resp regular and unlabored, CTA. Heart: RRR no s3, s4, or murmurs. Chest wall with well-healing midline incision. Abdomen: Soft, non-tender, non-distended, BS + x 4.  Drain sites healing well. Extremities: No clubbing, cyanosis or edema. DP/PT/Radials 2+ and equal bilaterally.  R calf SVG site healing well.  Accessory Clinical Findings  ECG - RSR, 64, inflat q's, lat twi - no acute st/t changes.  Assessment & Plan  1.  NSTEMI, subsequent episode of care/CAD:  S/p recent CABG x 4.  Overall doing well w/o chest pain or dyspnea.  Tolerating meds.  As he had ACS, I will add plavix 75mg  daily to his regimen and reduce ASA to 81mg  daily.  Cont bb and statin therapy.  I will provide him a rx for prn nitrates today.  He is not planning on partaking in cardiac rehab and I have encouraged him to reconsider.  2.  Post-op Afib:  In sinus and on amio 400 daily.  Cont bb/ASA.  3.  ICM:  EF 45% by echo.  Euvolemic on exam.  Cont bb.  No acei/arb/arni 2/2 CKD III with creat of 2.09 on d/c.  4.  CKD III:  Repeat bmet today to ensure stability.  5.  Htn:  BP trending in 120's to 130's @ home.  Higher today.  He will cont to follow @ home.  Cont bb, ccb, hctz.  6.  HL:  LDL 164.  Cont statin therapy.  F/U lipids/lft's in 8 wks.  7.  Tob Abuse:  He has quit.  8.  Dispo:  F/U with Dr. Angelena Form in 2 mos or sooner if necessary.   Murray Hodgkins, NP 01/26/2014, 10:25 AM

## 2014-01-26 NOTE — Patient Instructions (Signed)
Your physician has recommended you make the following change in your medication:   DECREASE YOUR ASPIRIN TO 81 MG ONCE DAILY   START TAKING PLAVIX 75 MG ONCE DAILY   YOU HAVE BEEN PRESCRIBED NITROGLYCERIN 0.4 MG SUBLINGUAL (UNDER THE TONGUE)- YOU CAN PLACE 1 TABLET UNDER THE TONGUE EVERY 5 MINUTES AS NEEDED ONLY FOR CHEST PAIN.  YOU CAN TAKE NO MORE THAN 3 TABLETS.  PLEASE FOLLOW THE DIRECTIONS ON THIS PRESCRIPTION CAREFULLY     Your physician recommends that you return for lab work in: [place] [facility])   Your physician recommends that you return for lab work in: [place] ([facility])-  PLEASE COME FASTING TO THIS APPOINTMENT   Your physician recommends that you schedule a follow-up appointment in: [place]

## 2014-01-27 ENCOUNTER — Other Ambulatory Visit: Payer: Self-pay | Admitting: Surgery

## 2014-01-27 DIAGNOSIS — I255 Ischemic cardiomyopathy: Secondary | ICD-10-CM

## 2014-01-28 ENCOUNTER — Encounter (HOSPITAL_COMMUNITY): Payer: Self-pay | Admitting: Interventional Cardiology

## 2014-02-01 ENCOUNTER — Ambulatory Visit (INDEPENDENT_AMBULATORY_CARE_PROVIDER_SITE_OTHER): Payer: Self-pay | Admitting: Surgical

## 2014-02-01 ENCOUNTER — Ambulatory Visit
Admission: RE | Admit: 2014-02-01 | Discharge: 2014-02-01 | Disposition: A | Discharge status: Home / Self Care | Payer: Self-pay | Source: Ambulatory Visit | Attending: Surgery | Admitting: Surgery

## 2014-02-01 VITALS — BP 117/74 | HR 56 | Resp 20 | Ht 69.0 in | Wt 218.0 lb

## 2014-02-01 DIAGNOSIS — I255 Ischemic cardiomyopathy: Secondary | ICD-10-CM

## 2014-02-01 DIAGNOSIS — Z951 Presence of aortocoronary bypass graft: Secondary | ICD-10-CM

## 2014-02-01 NOTE — Patient Instructions (Signed)
Discussed activity and driving progression.

## 2014-02-01 NOTE — Progress Notes (Signed)
[address]       [place] [address]             [phone]                  Jaideep Guiney DeWitt Medical Record [id number] Date of Birth: 15-Feb-1966  Referring [id number], [place] Primary Cardiology: Primary Care:FUNCHES, Lennox Laity, MD  Chief Complaint:  Follow Up Visit   History of Present Illness:      Procedure:  1. Median Sternotomy 2. Extracorporeal circulation 3. Coronary artery bypass grafting x 4   Left internal mammary graft to the LAD  SVG to Ramus  Sequential SVG to OM1 and PDA (LCX)  4. Endoscopic vein harvest from the right leg The patient is seen in routine office follow-up following the above procedure. He continues to steadily improve without significant surgically related issues. He does feel his stamina is not where he would like it to be although it is improving. He is not having shortness of breath or chest pain. He is not having fevers, chills or other constitutional symptoms.     Zubrod Score: At the time of surgery this patient's most appropriate activity status/level should be described as: []     0    Normal activity, no symptoms []     1    Restricted in physical strenuous activity but ambulatory, able to do out light work []     2    Ambulatory and capable of self care, unable to do work activities, up and about                 >50 % of waking hours                                                                                   []     3    Only limited self care, in bed greater than 50% of waking hours []     4    Completely disabled, no self care, confined to bed or chair []     5    Moribund  History  Smoking status  . Former Smoker -- 0.50 packs/day for 20 years  . Types: Cigarettes  . Quit date: 12/24/2013  Smokeless tobacco  . Never Used       Allergies  Allergen Reactions  . Shellfish Allergy Anaphylaxis    Throat close up, swelling    Current Outpatient Prescriptions  Medication  Sig Dispense Refill  . amiodarone (PACERONE) 400 MG tablet Take 1 tablet (400 mg total) by mouth 2 (two) times daily. For one week , then take 400 mg daily 70 tablet 1  . amLODipine (NORVASC) 10 MG tablet Take 1 tablet (10 mg total) by mouth daily. 30 tablet 1  . aspirin EC 81 MG tablet Take 1 tablet (81 mg total) by mouth daily. 90 tablet 3  . atorvastatin (LIPITOR) 40 MG tablet Take 1 tablet (40 mg total) by mouth daily at 6 PM. 30 tablet 1  . clopidogrel (PLAVIX) 75 MG tablet Take 1 tablet (75 mg total) by mouth daily. 90 tablet 3  . docusate sodium (COLACE) 100  MG capsule Take 1 capsule (100 mg total) by mouth 2 (two) times daily. 30 capsule 0  . hydrochlorothiazide (MICROZIDE) 12.5 MG capsule Take 1 capsule (12.5 mg total) by mouth daily.    . metoprolol tartrate (LOPRESSOR) 25 MG tablet Take 1 tablet (25 mg total) by mouth 2 (two) times daily. 60 tablet 1  . nitroGLYCERIN (NITROSTAT) 0.4 MG SL tablet Place 1 tablet (0.4 mg total) under the tongue every 5 (five) minutes as needed for chest pain. 90 tablet 3  . oxyCODONE (OXY IR/ROXICODONE) 5 MG immediate release tablet Take 1-2 tablets (5-10 mg total) by mouth every 4 (four) hours as needed for severe pain. 30 tablet 0   No current facility-administered medications for this visit.       Physical Exam: BP 117/74 mmHg  Pulse 56  Resp 20  Ht 5\' 9"  (1.753 m)  Wt 218 lb (98.884 kg)  BMI 32.18 kg/m2  SpO2 98%  General appearance: alert, cooperative and no distress Heart: regular rate and rhythm Lungs: clear to auscultation bilaterally Abdomen: Benign exam Extremities: extremities normal, atraumatic, no cyanosis or edema Wound: Incisions well-healed.  Diagnostic Studies & Laboratory data:         Recent Radiology Findings: Dg Chest 2 View  02/01/2014   CLINICAL DATA:  Heart surgery 4 weeks ago.  Smoker.  EXAM: CHEST  2 VIEW  COMPARISON:  01/04/2014.  FINDINGS: Mediastinum and hilar structures are normal. Prior CABG. Cardiomegaly  with normal pulmonary vascularity. No evidence of congestive heart failure. No pleural effusion or pneumothorax  IMPRESSION: 1. Prior CABG. No evidence of congestive heart failure. Chest has cleared from prior exam. 2. No acute pulmonary disease.   Electronically Signed   By: Marcello Moores  Register   On: 02/01/2014 13:27      Recent Labs: Lab Results  Component Value Date   WBC 10.5 01/06/2014   HGB 10.7* 01/06/2014   HCT 33.0* 01/06/2014   PLT 281 01/06/2014   GLUCOSE 108* 01/26/2014   CHOL 225* 12/27/2013   TRIG 159* 12/27/2013   HDL 29* 12/27/2013   LDLDIRECT 198* 05/29/2012   LDLCALC 164* 12/27/2013   ALT 33 12/27/2013   AST 25 12/27/2013   NA 138 01/26/2014   K 3.6 01/26/2014   CL 103 01/26/2014   CREATININE 2.6* 01/26/2014   BUN 31* 01/26/2014   CO2 26 01/26/2014   TSH 1.070 12/26/2013   INR 1.28 01/01/2014   HGBA1C 5.7* 12/26/2013      Assessment / Plan:  The patient is doing very well. There are no specific surgically related issues. We discussed driving and activity progression. I did encourage him to attend the cardiac rehabilitation program which he is thinking about. He is scheduled to see cardiology again tomorrow. We will see him again on an as-needed basis for any surgically related issues or at request.          Karis Rilling E 02/01/2014 2:01 PM

## 2014-02-02 ENCOUNTER — Other Ambulatory Visit: Payer: Self-pay | Admitting: *Deleted

## 2014-02-02 ENCOUNTER — Other Ambulatory Visit (INDEPENDENT_AMBULATORY_CARE_PROVIDER_SITE_OTHER): Payer: Medicaid Other | Admitting: *Deleted

## 2014-02-02 DIAGNOSIS — I2510 Atherosclerotic heart disease of native coronary artery without angina pectoris: Secondary | ICD-10-CM

## 2014-02-02 DIAGNOSIS — I255 Ischemic cardiomyopathy: Secondary | ICD-10-CM

## 2014-02-02 LAB — BASIC METABOLIC PANEL
BUN: 31 mg/dL — ABNORMAL HIGH (ref 6–23)
CO2: 24 mEq/L (ref 19–32)
Calcium: 9.1 mg/dL (ref 8.4–10.5)
Chloride: 106 mEq/L (ref 96–112)
Creatinine, Ser: 2.6 mg/dL — ABNORMAL HIGH (ref 0.4–1.5)
GFR: 33.49 mL/min — ABNORMAL LOW (ref >60.00)
GLUCOSE: 83 mg/dL (ref 70–99)
POTASSIUM: 3.8 meq/L (ref 3.5–5.1)
Sodium: 138 mEq/L (ref 135–145)

## 2014-02-02 MED ORDER — METOPROLOL TARTRATE 25 MG PO TABS: 25.0000 mg | ORAL_TABLET | Freq: Two times a day (BID) | ORAL | Status: DC

## 2014-02-02 MED ORDER — AMLODIPINE BESYLATE 10 MG PO TABS: 10.0000 mg | ORAL_TABLET | Freq: Every day | ORAL | Status: DC

## 2014-02-02 MED ORDER — ATORVASTATIN CALCIUM 40 MG PO TABS: 40.0000 mg | ORAL_TABLET | Freq: Every day | ORAL | Status: DC

## 2014-02-02 MED ORDER — HYDROCHLOROTHIAZIDE 12.5 MG PO CAPS: 12.5000 mg | ORAL_CAPSULE | Freq: Every day | ORAL | Status: DC

## 2014-02-02 MED ORDER — AMIODARONE HCL 400 MG PO TABS: 400.0000 mg | ORAL_TABLET | Freq: Every day | ORAL | Status: DC

## 2014-02-03 ENCOUNTER — Ambulatory Visit: Payer: BC Managed Care – PPO | Admitting: Surgery

## 2014-03-22 ENCOUNTER — Encounter: Payer: Self-pay | Admitting: Cardiovascular Disease

## 2014-03-22 ENCOUNTER — Ambulatory Visit (INDEPENDENT_AMBULATORY_CARE_PROVIDER_SITE_OTHER): Payer: Medicaid Other | Admitting: Cardiovascular Disease

## 2014-03-22 ENCOUNTER — Other Ambulatory Visit (INDEPENDENT_AMBULATORY_CARE_PROVIDER_SITE_OTHER): Payer: Medicaid Other | Admitting: *Deleted

## 2014-03-22 VITALS — BP 150/88 | HR 63 | Ht 69.0 in | Wt 229.8 lb

## 2014-03-22 DIAGNOSIS — I480 Paroxysmal atrial fibrillation: Secondary | ICD-10-CM

## 2014-03-22 DIAGNOSIS — I10 Essential (primary) hypertension: Secondary | ICD-10-CM

## 2014-03-22 DIAGNOSIS — I255 Ischemic cardiomyopathy: Secondary | ICD-10-CM

## 2014-03-22 DIAGNOSIS — I2510 Atherosclerotic heart disease of native coronary artery without angina pectoris: Secondary | ICD-10-CM

## 2014-03-22 DIAGNOSIS — N183 Chronic kidney disease, stage 3 unspecified: Secondary | ICD-10-CM

## 2014-03-22 DIAGNOSIS — Z720 Tobacco use: Secondary | ICD-10-CM

## 2014-03-22 DIAGNOSIS — F17201 Nicotine dependence, unspecified, in remission: Secondary | ICD-10-CM

## 2014-03-22 LAB — HEPATIC FUNCTION PANEL
ALT: 19 U/L (ref 0–53)
AST: 11 U/L (ref 0–37)
Albumin: 3.8 g/dL (ref 3.5–5.2)
Alkaline Phosphatase: 84 U/L (ref 39–117)
Bilirubin, Direct: 0.1 mg/dL (ref 0.0–0.3)
Total Bilirubin: 0.3 mg/dL (ref 0.2–1.2)
Total Protein: 7.4 g/dL (ref 6.0–8.3)

## 2014-03-22 LAB — LIPID PANEL
CHOL/HDL RATIO: 4
CHOLESTEROL: 147 mg/dL (ref 0–200)
HDL: 38.6 mg/dL — AB (ref >39.00)
LDL Cholesterol: 90 mg/dL (ref 0–99)
NONHDL: 108.4
Triglycerides: 93 mg/dL (ref 0.0–149.0)
VLDL: 18.6 mg/dL (ref 0.0–40.0)

## 2014-03-22 NOTE — Patient Instructions (Signed)
Your physician recommends that you schedule a follow-up appointment in:  3 months. Scheduled for Jun 24, 2014 at 8:45

## 2014-03-22 NOTE — Progress Notes (Signed)
History of Present Illness: 49 yo male with history of CAD s/p CABG, HTN, HLD, ischemic cardiomyopathy here today for cardiac follow up. He was admitted to [facility] with chest pain and dyspnea and ruled in for NSTEMI 12/26/13. Cardiac cath showed severe three vessel CAD. Echo showed LV dysfunction with an EF of 45%. He underwent CABG x 4. Post-op course was complicated by atrial fibrillation and he was placed on amiodarone with conversion to sinus rhythm.He has continued to take amiodarone.   He is here today for follow up. He reports no exertional chest pain. He does have soreness in his chest wall. No dyspnea. No leg pain. No dizziness, near syncope or syncope.   Primary Care Physician: Boykin Nearing  Last Lipid Profile:Lipid Panel     Component Value Date/Time   CHOL 225* 12/27/2013 0300   TRIG 159* 12/27/2013 0300   HDL 29* 12/27/2013 0300   CHOLHDL 7.8 12/27/2013 0300   VLDL 32 12/27/2013 0300   LDLCALC 164* 12/27/2013 0300     Past Medical History  Diagnosis Date  . Hypertension     a. Dx ~ 2000.  Marland Kitchen Atrial fibrillation with RVR     a. 12/2013 post-op CABG ->on amio.  Marland Kitchen CAD (coronary artery disease)     a. 12/2013 NSTEMI/Cath: 3VD;  b. 12/2013 CABG x 4: LIMA->LAD, VG->RI, VG->OM1->LPDA.  . Ischemic cardiomyopathy     a. 12/2013 Echo: EF 45%, Gr 2 DD, basal-mid inflat and inf AK, mild MR.  . Chronic combined systolic and diastolic CHF (congestive heart failure)     a. 12/2013 Echo: EF 45%, Gr 2 DD.  Marland Kitchen CKD (chronic kidney disease), stage III   . Tobacco abuse     Past Surgical History  Procedure Laterality Date  . None    . Coronary artery bypass graft N/A 01/01/2014    Procedure: CORONARY ARTERY BYPASS GRAFTING (CABG) times four using left internal mammary artery and right saphenous vein;  Surgeon: Gaye Pollack, MD;  Location: [place] OR;  Service: Open Heart Surgery;  Laterality: N/A;  . Intraoperative transesophageal echocardiogram N/A 01/01/2014    Procedure:  INTRAOPERATIVE TRANSESOPHAGEAL ECHOCARDIOGRAM;  Surgeon: Gaye Pollack, MD;  Location: [facility] OR;  Service: Open Heart Surgery;  Laterality: N/A;  . Left heart catheterization with coronary angiogram N/A 12/29/2013    Procedure: LEFT HEART CATHETERIZATION WITH CORONARY ANGIOGRAM;  Surgeon: Sinclair Grooms, MD;  Location: [facility] CATH LAB;  Service: Cardiovascular;  Laterality: N/A;    Current Outpatient Prescriptions  Medication Sig Dispense Refill  . amiodarone (PACERONE) 400 MG tablet Take 1 tablet (400 mg total) by mouth daily. 30 tablet 3  . amLODipine (NORVASC) 10 MG tablet Take 1 tablet (10 mg total) by mouth daily. 30 tablet 3  . aspirin EC 81 MG tablet Take 1 tablet (81 mg total) by mouth daily. 90 tablet 3  . atorvastatin (LIPITOR) 40 MG tablet Take 1 tablet (40 mg total) by mouth daily at 6 PM. 30 tablet 3  . clopidogrel (PLAVIX) 75 MG tablet Take 1 tablet (75 mg total) by mouth daily. 90 tablet 3  . docusate sodium (COLACE) 100 MG capsule Take 1 capsule (100 mg total) by mouth 2 (two) times daily. 30 capsule 0  . hydrochlorothiazide (MICROZIDE) 12.5 MG capsule Take 1 capsule (12.5 mg total) by mouth daily. 30 capsule 3  . metoprolol tartrate (LOPRESSOR) 25 MG tablet Take 1 tablet (25 mg total) by mouth 2 (two) times daily. 60 tablet  3  . nitroGLYCERIN (NITROSTAT) 0.4 MG SL tablet Place 1 tablet (0.4 mg total) under the tongue every 5 (five) minutes as needed for chest pain. 90 tablet 3  . oxyCODONE (OXY IR/ROXICODONE) 5 MG immediate release tablet Take 1-2 tablets (5-10 mg total) by mouth every 4 (four) hours as needed for severe pain. 30 tablet 0   No current facility-administered medications for this visit.    Allergies  Allergen Reactions  . Shellfish Allergy Anaphylaxis    Throat close up, swelling    History   Social History  . Marital Status: Single    Spouse Name: N/A    Number of Children: 0  . Years of Education: 12   Occupational History  . Unemployed    Social  History Main Topics  . Smoking status: Former Smoker -- 0.50 packs/day for 20 years    Types: Cigarettes    Quit date: 12/24/2013  . Smokeless tobacco: Never Used  . Alcohol Use: No     Comment: socially, 1-2 x year  . Drug Use: No  . Sexual Activity: Yes    Birth Control/ Protection: None   Other Topics Concern  . Not on file   Social History Narrative   Lives with fiance.   From [place], originally.   Moved to [place] in 2012.     Family History  Problem Relation Age of Onset  . Heart disease Mother 51  . Hypertension Father 31  . Asthma Father   . Diabetes Paternal Grandfather   . Cancer Neg Hx     Review of Systems:  As stated in the HPI and otherwise negative.   BP 150/88 mmHg  Pulse 63  Ht 5\' 9"  (1.753 m)  Wt 229 lb 12.8 oz (104.237 kg)  BMI 33.92 kg/m2  SpO2 98%  Physical Examination: General: Well developed, well nourished, NAD HEENT: OP clear, mucus membranes moist SKIN: warm, dry. No rashes. Neuro: No focal deficits Musculoskeletal: Muscle strength 5/5 all ext Psychiatric: Mood and affect normal Neck: No JVD, no carotid bruits, no thyromegaly, no lymphadenopathy. Lungs:Clear bilaterally, no wheezes, rhonci, crackles Cardiovascular: Regular rate and rhythm. No murmurs, gallops or rubs. Abdomen:Soft. Bowel sounds present. Non-tender.  Extremities: No lower extremity edema. Pulses are 2 + in the bilateral DP/PT.  Echo 12/26/13: Left ventricle: The cavity size was mildly dilated. Wall thickness was increased in a pattern of mild LVH. Systolic function was mildly reduced. The estimated ejection fraction was 45%. There is akinesis of the basal-midinferolateral and inferior myocardium. Features are consistent with a pseudonormal left ventricular filling pattern, with concomitant abnormal relaxation and increased filling pressure (grade 2 diastolic dysfunction). Doppler parameters are consistent with elevated ventricular end-diastolic  filling pressure. - Aortic valve: Mildly calcified annulus. Trileaflet. There was no regurgitation. - Mitral valve: There was mild regurgitation directed eccentrically. - Left atrium: The atrium was moderately dilated. - Right atrium: The atrium was mildly dilated. Central venous pressure (est): 3 mm Hg. - Tricuspid valve: There was trivial regurgitation. - Pulmonary arteries: Systolic pressure could not be accurately estimated. - Pericardium, extracardiac: There was no pericardial effusion.  Impressions:  - Mild LVH with LVEF approximately 45%, inferolateral wall motion abnormalities suggestive of ischemic cardiomyopathy. Grade 2 diastolic dysfunction with increased filling pressures. Moderate left atrial enlargement. Mild, eccentric mitral regurgitation. Unable to assess PASP.  Cardiac cath 12/29/13: The left main coronary artery is widely patent.  The left anterior descending artery is large and wraps around the left ventricular apex. There  is a proximal eccentric 70% stenosis and a mid eccentric 60-70% stenosis. FFR beyond the mid vessel stenosis was 0.78. The apical LAD contains 90% segmental stenosis before the apical segment. The first diagonal is totally occluded.  The left circumflex artery is severely and diffusely diseased. The mid circumflex contains an eccentric 80% stenosis. There is total occlusion of a moderate sized first obtuse marginal that arises beyond an 80% stenosis in the mid vessel. The distal vessel is diffusely diseased with up to 85% stenosis before the margin of the second obtuse marginal and the PDA.Marland Kitchen  The ramus intermedius is moderate in size and contains proximal to mid diffuse disease with up to 80% stenosis. The distal vessel may not be graftable.  The right coronary artery is nondominant. The right coronary is totally occluded in the mid-segment.   LEFT VENTRICULOGRAM: Left ventricular angiogram was not done. Pressure recordings  revealed markedly elevated LVEDP.   Assessment and Plan:   1. CAD: s/p CABG November 2015. He is doing well. Will continue ASA, Plavix, statin, beta blocker.   2. Ischemic cardiomyopathy: LVEF 45% by echo pre-CABG. Will repeat in 3 months.   3. HTN: BP controlled at home.   4. Tobacco abuse, in remission: He has not started back smoking.   5. Atrial fib, post-op: Sinus today. Will stop amiodarone.

## 2014-04-21 DIAGNOSIS — Z0271 Encounter for disability determination: Secondary | ICD-10-CM

## 2014-04-27 ENCOUNTER — Other Ambulatory Visit (HOSPITAL_COMMUNITY): Payer: Self-pay

## 2014-04-27 DIAGNOSIS — I429 Cardiomyopathy, unspecified: Secondary | ICD-10-CM

## 2014-04-27 DIAGNOSIS — Z951 Presence of aortocoronary bypass graft: Secondary | ICD-10-CM

## 2014-04-29 ENCOUNTER — Ambulatory Visit (HOSPITAL_COMMUNITY)
Admission: RE | Admit: 2014-04-29 | Discharge: 2014-04-29 | Disposition: A | Discharge status: Home / Self Care | Payer: Disability Insurance | Source: Ambulatory Visit | Attending: Cardiology | Admitting: Cardiology

## 2014-04-29 DIAGNOSIS — I429 Cardiomyopathy, unspecified: Secondary | ICD-10-CM | POA: Insufficient documentation

## 2014-04-29 DIAGNOSIS — I255 Ischemic cardiomyopathy: Secondary | ICD-10-CM

## 2014-04-29 DIAGNOSIS — Z951 Presence of aortocoronary bypass graft: Secondary | ICD-10-CM

## 2014-04-29 NOTE — Progress Notes (Signed)
2D Echo Performed 04/29/2014    Marygrace Drought, RCS

## 2014-05-03 ENCOUNTER — Telehealth: Payer: Self-pay | Admitting: Cardiovascular Disease

## 2014-05-03 NOTE — Telephone Encounter (Signed)
New Msg      Pt calling to request his Ejection Fracture percentage?    Please return call.

## 2014-05-03 NOTE — Telephone Encounter (Signed)
Echo shows improvement in LVEF since bypass. LVEF is 45-50%. No other abnormalities. cdm

## 2014-05-03 NOTE — Telephone Encounter (Signed)
Pt.notified

## 2014-05-31 ENCOUNTER — Other Ambulatory Visit: Payer: Self-pay | Admitting: Cardiovascular Disease

## 2014-05-31 ENCOUNTER — Other Ambulatory Visit: Payer: Self-pay | Admitting: *Deleted

## 2014-05-31 DIAGNOSIS — I2510 Atherosclerotic heart disease of native coronary artery without angina pectoris: Secondary | ICD-10-CM

## 2014-05-31 DIAGNOSIS — Z720 Tobacco use: Secondary | ICD-10-CM

## 2014-05-31 DIAGNOSIS — N183 Chronic kidney disease, stage 3 unspecified: Secondary | ICD-10-CM

## 2014-05-31 DIAGNOSIS — I10 Essential (primary) hypertension: Secondary | ICD-10-CM

## 2014-05-31 DIAGNOSIS — I255 Ischemic cardiomyopathy: Secondary | ICD-10-CM

## 2014-05-31 MED ORDER — CLOPIDOGREL BISULFATE 75 MG PO TABS: 75.0000 mg | ORAL_TABLET | Freq: Every day | ORAL | Status: DC

## 2014-06-23 NOTE — Progress Notes (Signed)
Chief Complaint  Patient presents with  . Shortness of Breath    History of Present Illness: 49 yo male with history of CAD s/p 4V CABG November 2015, HTN, HLD, ischemic cardiomyopathy here today for cardiac follow up. He was admitted to [facility] with chest pain and dyspnea and ruled in for NSTEMI 12/26/13. Cardiac cath showed severe three vessel CAD. Echo showed LV dysfunction with an EF of 45%. He underwent CABG x 4. Post-op course was complicated by atrial fibrillation and he was placed on amiodarone with conversion to sinus rhythm. Amiodarone was stopped at his last visit in our office 03/22/14.   He is here today for follow up. He reports no exertional chest pain  But has 25 lb weight gain, LE edema and SOB. No dizziness, near syncope or syncope.   Primary Care Physician: Boykin Nearing  Last Lipid Profile:Lipid Panel     Component Value Date/Time   CHOL 147 03/22/2014 0912   TRIG 93.0 03/22/2014 0912   HDL 38.60* 03/22/2014 0912   CHOLHDL 4 03/22/2014 0912   VLDL 18.6 03/22/2014 0912   LDLCALC 90 03/22/2014 0912     Past Medical History  Diagnosis Date  . Hypertension     a. Dx ~ 2000.  Marland Kitchen Atrial fibrillation with RVR     a. 12/2013 post-op CABG ->on amio.  Marland Kitchen CAD (coronary artery disease)     a. 12/2013 NSTEMI/Cath: 3VD;  b. 12/2013 CABG x 4: LIMA->LAD, VG->RI, VG->OM1->LPDA.  . Ischemic cardiomyopathy     a. 12/2013 Echo: EF 45%, Gr 2 DD, basal-mid inflat and inf AK, mild MR.  . Chronic combined systolic and diastolic CHF (congestive heart failure)     a. 12/2013 Echo: EF 45%, Gr 2 DD.  Marland Kitchen CKD (chronic kidney disease), stage III   . Tobacco abuse     Past Surgical History  Procedure Laterality Date  . None    . Coronary artery bypass graft N/A 01/01/2014    Procedure: CORONARY ARTERY BYPASS GRAFTING (CABG) times four using left internal mammary artery and right saphenous vein;  Surgeon: Gaye Pollack, MD;  Location: [place] OR;  Service: Open Heart Surgery;  Laterality:  N/A;  . Intraoperative transesophageal echocardiogram N/A 01/01/2014    Procedure: INTRAOPERATIVE TRANSESOPHAGEAL ECHOCARDIOGRAM;  Surgeon: Gaye Pollack, MD;  Location: [facility] OR;  Service: Open Heart Surgery;  Laterality: N/A;  . Left heart catheterization with coronary angiogram N/A 12/29/2013    Procedure: LEFT HEART CATHETERIZATION WITH CORONARY ANGIOGRAM;  Surgeon: Sinclair Grooms, MD;  Location: [facility] CATH LAB;  Service: Cardiovascular;  Laterality: N/A;    Current Outpatient Prescriptions  Medication Sig Dispense Refill  . amLODipine (NORVASC) 10 MG tablet TAKE 1 TABLET BY MOUTH DAILY. 30 tablet 1  . aspirin EC 81 MG tablet Take 1 tablet (81 mg total) by mouth daily. 90 tablet 3  . atorvastatin (LIPITOR) 40 MG tablet TAKE 1 TABLET BY MOUTH DAILY AT 6 PM. 30 tablet 1  . clopidogrel (PLAVIX) 75 MG tablet Take 1 tablet (75 mg total) by mouth daily. 30 tablet 1  . docusate sodium (COLACE) 100 MG capsule Take 1 capsule (100 mg total) by mouth 2 (two) times daily. 30 capsule 0  . metoprolol tartrate (LOPRESSOR) 25 MG tablet TAKE 1 TABLET BY MOUTH 2 TIMES DAILY. 60 tablet 1  . nitroGLYCERIN (NITROSTAT) 0.4 MG SL tablet Place 1 tablet (0.4 mg total) under the tongue every 5 (five) minutes as needed for chest pain. Calvin  tablet 3  . oxyCODONE (OXY IR/ROXICODONE) 5 MG immediate release tablet Take 1-2 tablets (5-10 mg total) by mouth every 4 (four) hours as needed for severe pain. 30 tablet 0  . furosemide (LASIX) 40 MG tablet Take 1 tablet (40 mg total) by mouth daily. 30 tablet 11   No current facility-administered medications for this visit.    Allergies  Allergen Reactions  . Shellfish Allergy Anaphylaxis    Throat close up, swelling    History   Social History  . Marital Status: Single    Spouse Name: N/A  . Number of Children: 0  . Years of Education: 12   Occupational History  . Unemployed    Social History Main Topics  . Smoking status: Former Smoker -- 0.50 packs/day for 20  years    Types: Cigarettes    Quit date: 12/24/2013  . Smokeless tobacco: Never Used  . Alcohol Use: No     Comment: socially, 1-2 x year  . Drug Use: No  . Sexual Activity: Yes    Birth Control/ Protection: None   Other Topics Concern  . Not on file   Social History Narrative   Lives with fiance.   From [place], originally.   Moved to [place] in 2012.     Family History  Problem Relation Age of Onset  . Heart disease Mother 58  . Hypertension Father 53  . Asthma Father   . Diabetes Paternal Grandfather   . Cancer Neg Hx     Review of Systems:  As stated in the HPI and otherwise negative.   BP 142/80 mmHg  Pulse 59  Ht 5\' 9"  (1.753 m)  Wt 243 lb 6.4 oz (110.406 kg)  BMI 35.93 kg/m2  Physical Examination: General: Well developed, well nourished, NAD HEENT: OP clear, mucus membranes moist SKIN: warm, dry. No rashes. Neuro: No focal deficits Musculoskeletal: Muscle strength 5/5 all ext Psychiatric: Mood and affect normal Neck: No JVD, no carotid bruits, no thyromegaly, no lymphadenopathy. Lungs:Clear bilaterally, no wheezes, rhonci, crackles Cardiovascular: Regular rate and rhythm. No murmurs, gallops or rubs. Abdomen:Soft. Bowel sounds present. Non-tender.  Extremities: No lower extremity edema. Pulses are 2 + in the bilateral DP/PT.  Echo 04/29/14: Left ventricle: The cavity size was normal. Wall thickness was increased in a pattern of moderate LVH. Systolic function was mildly reduced. The estimated ejection fraction was in the range of 45% to 50%. Inferolateral, basal inferior, and basal anterolateral hypokinesis. Features are consistent with a pseudonormal left ventricular filling pattern, with concomitant abnormal relaxation and increased filling pressure (grade 2 diastolic dysfunction). - Aortic valve: There was no stenosis. - Mitral valve: Mildly calcified annulus. Mildly calcified leaflets . There was trivial regurgitation. - Left  atrium: The atrium was mildly dilated. - Right ventricle: Systolic function was mildly reduced. - Right atrium: The atrium was mildly dilated. - Tricuspid valve: Peak RV-RA gradient (S): 18 mm Hg. - Pulmonary arteries: PA peak pressure: 21 mm Hg (S). - Inferior vena cava: The vessel was normal in size. The respirophasic diameter changes were in the normal range (= 50%), consistent with normal central venous pressure. Impressions: - Normal LV size with moderate LV hypertrophy. EF 45-50%. Wall motion abnormalities as noted above. Difficult images, echo contrast would probably have been helpful. Normal RV size with mildly decreased systolic function. No significant valvular abnormalities.  Cardiac cath 12/29/13: The left main coronary artery is widely patent. The left anterior descending artery is large and wraps around the left  ventricular apex. There is a proximal eccentric 70% stenosis and a mid eccentric 60-70% stenosis. FFR beyond the mid vessel stenosis was 0.78. The apical LAD contains 90% segmental stenosis before the apical segment. The first diagonal is totally occluded. The left circumflex artery is severely and diffusely diseased. The mid circumflex contains an eccentric 80% stenosis. There is total occlusion of a moderate sized first obtuse marginal that arises beyond an 80% stenosis in the mid vessel. The distal vessel is diffusely diseased with up to 85% stenosis before the margin of the second obtuse marginal and the PDA.Marland Kitchen The ramus intermedius is moderate in size and contains proximal to mid diffuse disease with up to 80% stenosis. The distal vessel may not be graftable. The right coronary artery is nondominant. The right coronary is totally occluded in the mid-segment. LEFT VENTRICULOGRAM: Left ventricular angiogram was not done. Pressure recordings revealed markedly elevated LVEDP.  EKG:  EKG is not ordered today. The ekg ordered today demonstrates   Recent  Labs: 12/26/2013: Pro B Natriuretic peptide (BNP) 6298.0*; TSH 1.070 01/02/2014: Magnesium 2.7* 01/06/2014: Hemoglobin 10.7*; Platelets 281 02/02/2014: BUN 31*; Creatinine 2.6*; Potassium 3.8; Sodium 138 03/22/2014: ALT 19   Lipid Panel    Component Value Date/Time   CHOL 147 03/22/2014 0912   TRIG 93.0 03/22/2014 0912   HDL 38.60* 03/22/2014 0912   CHOLHDL 4 03/22/2014 0912   VLDL 18.6 03/22/2014 0912   LDLCALC 90 03/22/2014 0912   LDLDIRECT 198* 05/29/2012 1744     Wt Readings from Last 3 Encounters:  06/24/14 243 lb 6.4 oz (110.406 kg)  03/22/14 229 lb 12.8 oz (104.237 kg)  02/01/14 218 lb (98.884 kg)     Other studies Reviewed: Additional studies/ records that were reviewed today include:. Review of the above records demonstrates:   Assessment and Plan:   1. CAD: s/p CABG November 2015. He is doing well. Will continue ASA, Plavix, statin, beta blocker.   2. Ischemic cardiomyopathy: LVEF 45% by echo march 2016.    3. HTN: BP controlled at home. No changes.   4. Tobacco abuse, in remission: He has not started back smoking.   5. Atrial fib, post-op: Sinus today.   6. Acute on chronic systolic CHF: Will stop HCTZ and start Lasix 40 mg daily. Check BMET today.   Current medicines are reviewed at length with the patient today.  The patient does not have concerns regarding medicines.  The following changes have been made:  no change  Labs/ tests ordered today include:   Orders Placed This Encounter  Procedures  . Basic Metabolic Panel (BMET)    Disposition:   FU with me  in 6 months  Signed, Lauree Chandler, MD 06/24/2014 10:26 AM    [facility] [place], [place], [place]  [address] Phone: [phone]; Fax: [phone]

## 2014-06-24 ENCOUNTER — Other Ambulatory Visit: Payer: Self-pay | Admitting: *Deleted

## 2014-06-24 ENCOUNTER — Encounter: Payer: Self-pay | Admitting: Cardiovascular Disease

## 2014-06-24 ENCOUNTER — Telehealth: Payer: Self-pay | Admitting: Cardiovascular Disease

## 2014-06-24 ENCOUNTER — Ambulatory Visit (INDEPENDENT_AMBULATORY_CARE_PROVIDER_SITE_OTHER): Payer: Medicaid Other | Admitting: Cardiovascular Disease

## 2014-06-24 ENCOUNTER — Encounter: Payer: Self-pay | Admitting: *Deleted

## 2014-06-24 VITALS — BP 142/80 | HR 59 | Ht 69.0 in | Wt 243.4 lb

## 2014-06-24 DIAGNOSIS — I2510 Atherosclerotic heart disease of native coronary artery without angina pectoris: Secondary | ICD-10-CM

## 2014-06-24 DIAGNOSIS — I5043 Acute on chronic combined systolic (congestive) and diastolic (congestive) heart failure: Secondary | ICD-10-CM

## 2014-06-24 DIAGNOSIS — F17201 Nicotine dependence, unspecified, in remission: Secondary | ICD-10-CM

## 2014-06-24 DIAGNOSIS — I10 Essential (primary) hypertension: Secondary | ICD-10-CM

## 2014-06-24 DIAGNOSIS — I429 Cardiomyopathy, unspecified: Secondary | ICD-10-CM | POA: Diagnosis not present

## 2014-06-24 LAB — BASIC METABOLIC PANEL
BUN: 22 mg/dL (ref 6–23)
CALCIUM: 9.2 mg/dL (ref 8.4–10.5)
CHLORIDE: 103 meq/L (ref 96–112)
CO2: 29 meq/L (ref 19–32)
Creatinine, Ser: 2.17 mg/dL — ABNORMAL HIGH (ref 0.40–1.50)
GFR: 41.74 mL/min — ABNORMAL LOW (ref >60.00)
Glucose, Bld: 169 mg/dL — ABNORMAL HIGH (ref 70–99)
Potassium: 3.2 mEq/L — ABNORMAL LOW (ref 3.5–5.1)
Sodium: 139 mEq/L (ref 135–145)

## 2014-06-24 MED ORDER — POTASSIUM CHLORIDE CRYS ER 20 MEQ PO TBCR: 40.0000 meq | EXTENDED_RELEASE_TABLET | Freq: Every day | ORAL | Status: DC

## 2014-06-24 MED ORDER — FUROSEMIDE 40 MG PO TABS: 40.0000 mg | ORAL_TABLET | Freq: Every day | ORAL | Status: DC

## 2014-06-24 NOTE — Telephone Encounter (Signed)
Spoke with pt and told him furosemide prescription has been sent to [facility].  He took HCTZ today so will start furosemide tomorrow.

## 2014-06-24 NOTE — Telephone Encounter (Signed)
New MEssage  Pt calling to see if brand new prescription of furosemide 40 mg will be called in by today to start tomorrow. Please call back and discuss.

## 2014-06-24 NOTE — Patient Instructions (Signed)
Medication Instructions:  Your physician has recommended you make the following change in your medication: Stop hydrochlorothiazide.  Start furosemide 40 mg by mouth daily.   Labwork: Lab work to be done today--BMP  Testing/Procedures: None   Follow-Up: Your physician wants you to follow-up in: 6 months.  You will receive a reminder letter in the mail two months in advance. If you don't receive a letter, please call our office to schedule the follow-up appointment.   Any Other Special Instructions Will Be Listed Below (If Applicable).

## 2014-06-25 ENCOUNTER — Telehealth: Payer: Self-pay | Admitting: *Deleted

## 2014-06-25 ENCOUNTER — Encounter: Payer: Self-pay | Admitting: Cardiovascular Disease

## 2014-06-25 NOTE — Telephone Encounter (Signed)
Dr. Angelena Form wrote letter for pt. I placed call to pt and left message to call back

## 2014-06-25 NOTE — Telephone Encounter (Signed)
Pt notified. He would like letter mailed to him. I confirmed his address as listed. Letter mailed to pt

## 2014-07-02 ENCOUNTER — Other Ambulatory Visit (INDEPENDENT_AMBULATORY_CARE_PROVIDER_SITE_OTHER): Payer: Medicaid Other | Admitting: *Deleted

## 2014-07-02 DIAGNOSIS — I5043 Acute on chronic combined systolic (congestive) and diastolic (congestive) heart failure: Secondary | ICD-10-CM

## 2014-07-02 LAB — BASIC METABOLIC PANEL
BUN: 17 mg/dL (ref 6–23)
CO2: 30 meq/L (ref 19–32)
CREATININE: 2.09 mg/dL — AB (ref 0.40–1.50)
Calcium: 9.7 mg/dL (ref 8.4–10.5)
Chloride: 103 mEq/L (ref 96–112)
GFR: 43.59 mL/min — AB (ref >60.00)
Glucose, Bld: 142 mg/dL — ABNORMAL HIGH (ref 70–99)
POTASSIUM: 3.9 meq/L (ref 3.5–5.1)
Sodium: 139 mEq/L (ref 135–145)

## 2014-07-02 NOTE — Addendum Note (Signed)
Addended by: Eulis Foster on: 07/02/2014 09:59 AM   Modules accepted: Orders

## 2014-07-20 ENCOUNTER — Other Ambulatory Visit: Payer: Self-pay | Admitting: Cardiovascular Disease

## 2014-08-05 ENCOUNTER — Encounter: Payer: Self-pay | Admitting: Family Medicine

## 2014-08-05 NOTE — Telephone Encounter (Signed)
Error

## 2014-08-16 ENCOUNTER — Ambulatory Visit: Payer: Medicaid Other | Attending: Family Medicine | Admitting: Family Medicine

## 2014-08-16 ENCOUNTER — Encounter: Payer: Self-pay | Admitting: Family Medicine

## 2014-08-16 ENCOUNTER — Telehealth: Payer: Self-pay | Admitting: Clinical

## 2014-08-16 VITALS — BP 129/84 | HR 57 | Temp 98.2°F | Resp 18 | Ht 69.0 in | Wt 247.0 lb

## 2014-08-16 DIAGNOSIS — N183 Chronic kidney disease, stage 3 unspecified: Secondary | ICD-10-CM

## 2014-08-16 DIAGNOSIS — I5042 Chronic combined systolic (congestive) and diastolic (congestive) heart failure: Secondary | ICD-10-CM | POA: Diagnosis not present

## 2014-08-16 DIAGNOSIS — N179 Acute kidney failure, unspecified: Secondary | ICD-10-CM

## 2014-08-16 DIAGNOSIS — N189 Chronic kidney disease, unspecified: Secondary | ICD-10-CM

## 2014-08-16 DIAGNOSIS — M25512 Pain in left shoulder: Secondary | ICD-10-CM

## 2014-08-16 DIAGNOSIS — Z114 Encounter for screening for human immunodeficiency virus [HIV]: Secondary | ICD-10-CM

## 2014-08-16 DIAGNOSIS — D649 Anemia, unspecified: Secondary | ICD-10-CM

## 2014-08-16 DIAGNOSIS — I10 Essential (primary) hypertension: Secondary | ICD-10-CM

## 2014-08-16 LAB — BASIC METABOLIC PANEL
BUN: 22 mg/dL (ref 6–23)
CALCIUM: 9.6 mg/dL (ref 8.4–10.5)
CO2: 28 mEq/L (ref 19–32)
Chloride: 106 mEq/L (ref 96–112)
Creat: 2.31 mg/dL — ABNORMAL HIGH (ref 0.50–1.35)
GLUCOSE: 108 mg/dL — AB (ref 70–99)
Potassium: 5 mEq/L (ref 3.5–5.3)
Sodium: 144 mEq/L (ref 135–145)

## 2014-08-16 LAB — CBC
HEMATOCRIT: 40.9 % (ref 39.0–52.0)
Hemoglobin: 13.7 g/dL (ref 13.0–17.0)
MCH: 27.6 pg (ref 26.0–34.0)
MCHC: 33.5 g/dL (ref 30.0–36.0)
MCV: 82.3 fL (ref 78.0–100.0)
MPV: 9.9 fL (ref 8.6–12.4)
Platelets: 252 10*3/uL (ref 150–400)
RBC: 4.97 MIL/uL (ref 4.22–5.81)
RDW: 15.1 % (ref 11.5–15.5)
WBC: 8 10*3/uL (ref 4.0–10.5)

## 2014-08-16 MED ORDER — FUROSEMIDE 40 MG PO TABS: 40.0000 mg | ORAL_TABLET | Freq: Two times a day (BID) | ORAL | Status: DC

## 2014-08-16 NOTE — Progress Notes (Signed)
Patient here for check up, and complains of limited range of motion in left arm after quadruple bypass in November.  Patient complains of intermittent pain in left shoulder and left biceps area. Patient reports tingling/tightness in hand when resting, but no pain unless moving it a certain way- rates [id number], described as sharp. Patient complains of chest pain that he states "is going to have for a while according to doctors, from surgeon" and was instructed to avoid strenuous exercise, hot/cold temperatures.  Patient also reports SOB.    Patient requests copy of medical records/notes from today to submit to attorney.  Ambulatory Pulse Ox reading started and stayed at 99% walking around the clinic. Patient back to room, O2 dropped to 97%, then increased and stayed at 98%.  Patient reported feeling short of breath walking around the clinic.  HR 61-72 BPM.

## 2014-08-16 NOTE — Patient Instructions (Signed)
[name],  Thank you for coming in today  1. HTN: BP well controlled  2. 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 L anterior shoulder pain: I suspect rotator cuff impingement Sports medicine referral placed for sports medicine ultrasound  4. SOB: could be CHF related, you had mild anemia in the hospital Plan: Increase lasix to 40 mg twice daily to keep extra fluid down  CBC today, renal referral given kidney impairment   F/u in 2 weeks for RN BP and weight check, BMP at this time  F/u with me in 6 weeks for SOB and CHF  Dr. Adrian Blackwater   Impingement Syndrome, Rotator Cuff, Bursitis with Rehab Impingement syndrome is a condition that involves inflammation of the tendons of the rotator cuff and the subacromial bursa, that causes pain in the shoulder. The rotator cuff consists of four tendons and muscles that control much of the shoulder and upper arm function. The subacromial bursa is a fluid filled sac that helps reduce friction between the rotator cuff and one of the bones of the shoulder (acromion). Impingement syndrome is usually an overuse injury that causes swelling of the bursa (bursitis), swelling of the tendon (tendonitis), and/or a tear of the tendon (strain). Strains are classified into three categories. Grade 1 strains cause pain, but the tendon is not lengthened. Grade 2 strains include a lengthened ligament, due to the ligament being stretched or partially ruptured. With grade 2 strains there is still function, although the function may be decreased. Grade 3 strains include a complete tear of the tendon or muscle, and function is usually impaired. SYMPTOMS   Pain around the shoulder, often at the outer portion of the upper arm.  Pain that gets worse with shoulder function, especially when reaching overhead or lifting.  Sometimes, aching when not using the arm.  Pain that wakes you up at night.  Sometimes, tenderness, swelling, warmth,  or redness over the affected area.  Loss of strength.  Limited motion of the shoulder, especially reaching behind the back (to the back pocket or to unhook bra) or across your body.  Crackling sound (crepitation) when moving the arm.  Biceps tendon pain and inflammation (in the front of the shoulder). Worse when bending the elbow or lifting. CAUSES  Impingement syndrome is often an overuse injury, in which chronic (repetitive) motions cause the tendons or bursa to become inflamed. A strain occurs when a force is paced on the tendon or muscle that is greater than it can withstand. Common mechanisms of injury include: Stress from sudden increase in duration, frequency, or intensity of training.  Direct hit (trauma) to the shoulder.  Aging, erosion of the tendon with normal use.  Bony bump on shoulder (acromial spur). RISK INCREASES WITH:  Contact sports (football, wrestling, boxing).  Throwing sports (baseball, tennis, volleyball).  Weightlifting and bodybuilding.  Heavy labor.  Previous injury to the rotator cuff, including impingement.  Poor shoulder strength and flexibility.  Failure to warm up properly before activity.  Inadequate protective equipment.  Old age.  Bony bump on shoulder (acromial spur). PREVENTION   Warm up and stretch properly before activity.  Allow for adequate recovery between workouts.  Maintain physical fitness:  Strength, flexibility, and endurance.  Cardiovascular fitness.  Learn and use proper exercise technique. PROGNOSIS  If treated properly, impingement syndrome usually goes away within 6 weeks. Sometimes surgery is required.  RELATED COMPLICATIONS   Longer healing time if not  properly treated, or if not given enough time to heal.  Recurring symptoms, that result in a chronic condition.  Shoulder stiffness, frozen shoulder, or loss of motion.  Rotator cuff tendon tear.  Recurring symptoms, especially if activity is resumed  too soon, with overuse, with a direct blow, or when using poor technique. TREATMENT  Treatment first involves the use of ice and medicine, to reduce pain and inflammation. The use of strengthening and stretching exercises may help reduce pain with activity. These exercises may be performed at home or with a therapist. If non-surgical treatment is unsuccessful after more than 6 months, surgery may be advised. After surgery and rehabilitation, activity is usually possible in 3 months.  MEDICATION  If pain medicine is needed, nonsteroidal anti-inflammatory medicines (aspirin and ibuprofen), or other minor pain relievers (acetaminophen), are often advised.  Do not take pain medicine for 7 days before surgery.  Prescription pain relievers may be given, if your caregiver thinks they are needed. Use only as directed and only as much as you need.  Corticosteroid injections may be given by your caregiver. These injections should be reserved for the most serious cases, because they may only be given a certain number of times. HEAT AND COLD  Cold treatment (icing) should be applied for 10 to 15 minutes every 2 to 3 hours for inflammation and pain, and immediately after activity that aggravates your symptoms. Use ice packs or an ice massage.  Heat treatment may be used before performing stretching and strengthening activities prescribed by your caregiver, physical therapist, or athletic trainer. Use a heat pack or a warm water soak. SEEK MEDICAL CARE IF:   Symptoms get worse or do not improve in 4 to 6 weeks, despite treatment.  New, unexplained symptoms develop. (Drugs used in treatment may produce side effects.) EXERCISES  RANGE OF MOTION (ROM) AND STRETCHING EXERCISES - Impingement Syndrome (Rotator Cuff  Tendinitis, Bursitis) These exercises may help you when beginning to rehabilitate your injury. Your symptoms may go away with or without further involvement from your physician, physical therapist  or athletic trainer. While completing these exercises, remember:   Restoring tissue flexibility helps normal motion to return to the joints. This allows healthier, less painful movement and activity.  An effective stretch should be held for at least 30 seconds.  A stretch should never be painful. You should only feel a gentle lengthening or release in the stretched tissue. STRETCH - Flexion, Standing  Stand with good posture. With an underhand grip on your right / left hand, and an overhand grip on the opposite hand, grasp a broomstick or cane so that your hands are a little more than shoulder width apart.  Keeping your right / left elbow straight and shoulder muscles relaxed, push the stick with your opposite hand, to raise your right / left arm in front of your body and then overhead. Raise your arm until you feel a stretch in your right / left shoulder, but before you have increased shoulder pain.  Try to avoid shrugging your right / left shoulder as your arm rises, by keeping your shoulder blade tucked down and toward your mid-back spine. Hold for __________ seconds.  Slowly return to the starting position. Repeat __________ times. Complete this exercise __________ times per day. STRETCH - Abduction, Supine  Lie on your back. With an underhand grip on your right / left hand and an overhand grip on the opposite hand, grasp a broomstick or cane so that your hands are a  little more than shoulder width apart.  Keeping your right / left elbow straight and your shoulder muscles relaxed, push the stick with your opposite hand, to raise your right / left arm out to the side of your body and then overhead. Raise your arm until you feel a stretch in your right / left shoulder, but before you have increased shoulder pain.  Try to avoid shrugging your right / left shoulder as your arm rises, by keeping your shoulder blade tucked down and toward your mid-back spine. Hold for __________  seconds.  Slowly return to the starting position. Repeat __________ times. Complete this exercise __________ times per day. ROM - Flexion, Active-Assisted  Lie on your back. You may bend your knees for comfort.  Grasp a broomstick or cane so your hands are about shoulder width apart. Your right / left hand should grip the end of the stick, so that your hand is positioned "thumbs-up," as if you were about to shake hands.  Using your healthy arm to lead, raise your right / left arm overhead, until you feel a gentle stretch in your shoulder. Hold for __________ seconds.  Use the stick to assist in returning your right / left arm to its starting position. Repeat __________ times. Complete this exercise __________ times per day.  ROM - Internal Rotation, Supine   Lie on your back on a firm surface. Place your right / left elbow about 60 degrees away from your side. Elevate your elbow with a folded towel, so that the elbow and shoulder are the same height.  Using a broomstick or cane and your strong arm, pull your right / left hand toward your body until you feel a gentle stretch, but no increase in your shoulder pain. Keep your shoulder and elbow in place throughout the exercise.  Hold for __________ seconds. Slowly return to the starting position. Repeat __________ times. Complete this exercise __________ times per day. STRETCH - Internal Rotation  Place your right / left hand behind your back, palm up.  Throw a towel or belt over your opposite shoulder. Grasp the towel with your right / left hand.  While keeping an upright posture, gently pull up on the towel, until you feel a stretch in the front of your right / left shoulder.  Avoid shrugging your right / left shoulder as your arm rises, by keeping your shoulder blade tucked down and toward your mid-back spine.  Hold for __________ seconds. Release the stretch, by lowering your healthy hand. Repeat __________ times. Complete this  exercise __________ times per day. ROM - Internal Rotation   Using an underhand grip, grasp a stick behind your back with both hands.  While standing upright with good posture, slide the stick up your back until you feel a mild stretch in the front of your shoulder.  Hold for __________ seconds. Slowly return to your starting position. Repeat __________ times. Complete this exercise __________ times per day.  STRETCH - Posterior Shoulder Capsule   Stand or sit with good posture. Grasp your right / left elbow and draw it across your chest, keeping it at the same height as your shoulder.  Pull your elbow, so your upper arm comes in closer to your chest. Pull until you feel a gentle stretch in the back of your shoulder.  Hold for __________ seconds. Repeat __________ times. Complete this exercise __________ times per day. STRENGTHENING EXERCISES - Impingement Syndrome (Rotator Cuff Tendinitis, Bursitis) These exercises may help you when beginning to  rehabilitate your injury. They may resolve your symptoms with or without further involvement from your physician, physical therapist or athletic trainer. While completing these exercises, remember:  Muscles can gain both the endurance and the strength needed for everyday activities through controlled exercises.  Complete these exercises as instructed by your physician, physical therapist or athletic trainer. Increase the resistance and repetitions only as guided.  You may experience muscle soreness or fatigue, but the pain or discomfort you are trying to eliminate should never worsen during these exercises. If this pain does get worse, stop and make sure you are following the directions exactly. If the pain is still present after adjustments, discontinue the exercise until you can discuss the trouble with your clinician.  During your recovery, avoid activity or exercises which involve actions that place your injured hand or elbow above your head or  behind your back or head. These positions stress the tissues which you are trying to heal. STRENGTH - Scapular Depression and Adduction   With good posture, sit on a firm chair. Support your arms in front of you, with pillows, arm rests, or on a table top. Have your elbows in line with the sides of your body.  Gently draw your shoulder blades down and toward your mid-back spine. Gradually increase the tension, without tensing the muscles along the top of your shoulders and the back of your neck.  Hold for __________ seconds. Slowly release the tension and relax your muscles completely before starting the next repetition.  After you have practiced this exercise, remove the arm support and complete the exercise in standing as well as sitting position. Repeat __________ times. Complete this exercise __________ times per day.  STRENGTH - Shoulder Abductors, Isometric  With good posture, stand or sit about 4-6 inches from a wall, with your right / left side facing the wall.  Bend your right / left elbow. Gently press your right / left elbow into the wall. Increase the pressure gradually, until you are pressing as hard as you can, without shrugging your shoulder or increasing any shoulder discomfort.  Hold for __________ seconds.  Release the tension slowly. Relax your shoulder muscles completely before you begin the next repetition. Repeat __________ times. Complete this exercise __________ times per day.  STRENGTH - External Rotators, Isometric  Keep your right / left elbow at your side and bend it 90 degrees.  Step into a door frame so that the outside of your right / left wrist can press against the door frame without your upper arm leaving your side.  Gently press your right / left wrist into the door frame, as if you were trying to swing the back of your hand away from your stomach. Gradually increase the tension, until you are pressing as hard as you can, without shrugging your shoulder  or increasing any shoulder discomfort.  Hold for __________ seconds.  Release the tension slowly. Relax your shoulder muscles completely before you begin the next repetition. Repeat __________ times. Complete this exercise __________ times per day.  STRENGTH - Supraspinatus   Stand or sit with good posture. Grasp a __________ weight, or an exercise band or tubing, so that your hand is "thumbs-up," like you are shaking hands.  Slowly lift your right / left arm in a "V" away from your thigh, diagonally into the space between your side and straight ahead. Lift your hand to shoulder height or as far as you can, without increasing any shoulder pain. At first, many people do  not lift their hands above shoulder height.  Avoid shrugging your right / left shoulder as your arm rises, by keeping your shoulder blade tucked down and toward your mid-back spine.  Hold for __________ seconds. Control the descent of your hand, as you slowly return to your starting position. Repeat __________ times. Complete this exercise __________ times per day.  STRENGTH - External Rotators  Secure a rubber exercise band or tubing to a fixed object (table, pole) so that it is at the same height as your right / left elbow when you are standing or sitting on a firm surface.  Stand or sit so that the secured exercise band is at your uninjured side.  Bend your right / left elbow 90 degrees. Place a folded towel or small pillow under your right / left arm, so that your elbow is a few inches away from your side.  Keeping the tension on the exercise band, pull it away from your body, as if pivoting on your elbow. Be sure to keep your body steady, so that the movement is coming only from your rotating shoulder.  Hold for __________ seconds. Release the tension in a controlled manner, as you return to the starting position. Repeat __________ times. Complete this exercise __________ times per day.  STRENGTH - Internal Rotators    Secure a rubber exercise band or tubing to a fixed object (table, pole) so that it is at the same height as your right / left elbow when you are standing or sitting on a firm surface.  Stand or sit so that the secured exercise band is at your right / left side.  Bend your elbow 90 degrees. Place a folded towel or small pillow under your right / left arm so that your elbow is a few inches away from your side.  Keeping the tension on the exercise band, pull it across your body, toward your stomach. Be sure to keep your body steady, so that the movement is coming only from your rotating shoulder.  Hold for __________ seconds. Release the tension in a controlled manner, as you return to the starting position. Repeat __________ times. Complete this exercise __________ times per day.  STRENGTH - Scapular Protractors, Standing   Stand arms length away from a wall. Place your hands on the wall, keeping your elbows straight.  Begin by dropping your shoulder blades down and toward your mid-back spine.  To strengthen your protractors, keep your shoulder blades down, but slide them forward on your rib cage. It will feel as if you are lifting the back of your rib cage away from the wall. This is a subtle motion and can be challenging to complete. Ask your caregiver for further instruction, if you are not sure you are doing the exercise correctly.  Hold for __________ seconds. Slowly return to the starting position, resting the muscles completely before starting the next repetition. Repeat __________ times. Complete this exercise __________ times per day. STRENGTH - Scapular Protractors, Supine  Lie on your back on a firm surface. Extend your right / left arm straight into the air while holding a __________ weight in your hand.  Keeping your head and back in place, lift your shoulder off the floor.  Hold for __________ seconds. Slowly return to the starting position, and allow your muscles to relax  completely before starting the next repetition. Repeat __________ times. Complete this exercise __________ times per day. STRENGTH - Scapular Protractors, Quadruped  Get onto your hands and knees, with  your shoulders directly over your hands (or as close as you can be, comfortably).  Keeping your elbows locked, lift the back of your rib cage up into your shoulder blades, so your mid-back rounds out. Keep your neck muscles relaxed.  Hold this position for __________ seconds. Slowly return to the starting position and allow your muscles to relax completely before starting the next repetition. Repeat __________ times. Complete this exercise __________ times per day.  STRENGTH - Scapular Retractors  Secure a rubber exercise band or tubing to a fixed object (table, pole), so that it is at the height of your shoulders when you are either standing, or sitting on a firm armless chair.  With a palm down grip, grasp an end of the band in each hand. Straighten your elbows and lift your hands straight in front of you, at shoulder height. Step back, away from the secured end of the band, until it becomes tense.  Squeezing your shoulder blades together, draw your elbows back toward your sides, as you bend them. Keep your upper arms lifted away from your body throughout the exercise.  Hold for __________ seconds. Slowly ease the tension on the band, as you reverse the directions and return to the starting position. Repeat __________ times. Complete this exercise __________ times per day. STRENGTH - Shoulder Extensors   Secure a rubber exercise band or tubing to a fixed object (table, pole) so that it is at the height of your shoulders when you are either standing, or sitting on a firm armless chair.  With a thumbs-up grip, grasp an end of the band in each hand. Straighten your elbows and lift your hands straight in front of you, at shoulder height. Step back, away from the secured end of the band, until it  becomes tense.  Squeezing your shoulder blades together, pull your hands down to the sides of your thighs. Do not allow your hands to go behind you.  Hold for __________ seconds. Slowly ease the tension on the band, as you reverse the directions and return to the starting position. Repeat __________ times. Complete this exercise __________ times per day.  STRENGTH - Scapular Retractors and External Rotators   Secure a rubber exercise band or tubing to a fixed object (table, pole) so that it is at the height as your shoulders, when you are either standing, or sitting on a firm armless chair.  With a palm down grip, grasp an end of the band in each hand. Bend your elbows 90 degrees and lift your elbows to shoulder height, at your sides. Step back, away from the secured end of the band, until it becomes tense.  Squeezing your shoulder blades together, rotate your shoulders so that your upper arms and elbows remain stationary, but your fists travel upward to head height.  Hold for __________ seconds. Slowly ease the tension on the band, as you reverse the directions and return to the starting position. Repeat __________ times. Complete this exercise __________ times per day.  STRENGTH - Scapular Retractors and External Rotators, Rowing   Secure a rubber exercise band or tubing to a fixed object (table, pole) so that it is at the height of your shoulders, when you are either standing, or sitting on a firm armless chair.  With a palm down grip, grasp an end of the band in each hand. Straighten your elbows and lift your hands straight in front of you, at shoulder height. Step back, away from the secured end of the band,  until it becomes tense.  Step 1: Squeeze your shoulder blades together. Bending your elbows, draw your hands to your chest, as if you are rowing a boat. At the end of this motion, your hands and elbow should be at shoulder height and your elbows should be out to your sides.  Step 2:  Rotate your shoulders, to raise your hands above your head. Your forearms should be vertical and your upper arms should be horizontal.  Hold for __________ seconds. Slowly ease the tension on the band, as you reverse the directions and return to the starting position. Repeat __________ times. Complete this exercise __________ times per day.  STRENGTH - Scapular Depressors  Find a sturdy chair without wheels, such as a dining room chair.  Keeping your feet on the floor, and your hands on the chair arms, lift your bottom up from the seat, and lock your elbows.  Keeping your elbows straight, allow gravity to pull your body weight down. Your shoulders will rise toward your ears.  Raise your body against gravity by drawing your shoulder blades down your back, shortening the distance between your shoulders and ears. Although your feet should always maintain contact with the floor, your feet should progressively support less body weight, as you get stronger.  Hold for __________ seconds. In a controlled and slow manner, lower your body weight to begin the next repetition. Repeat __________ times. Complete this exercise __________ times per day.  Document Released: 02/05/2005 Document Revised: 04/30/2011 Document Reviewed: 05/20/2008 [facility] Patient Information 2015 [place], [place]. This information is not intended to replace advice given to you by your health care provider. Make sure you discuss any questions you have with your health care provider.

## 2014-08-16 NOTE — Telephone Encounter (Signed)
F/u w pt; referred by Dr. Adrian Blackwater, pt states that he has already filed for disability, and will call back if he needs any additional resources

## 2014-08-16 NOTE — Progress Notes (Signed)
   Subjective:    Patient ID: Seth Keller, male    DOB: 05-28-1965, 50 y.o.   MRN: [id number] CC: L shoulder and L arm pain , chronic CP and SOB s/p quadruple bypass in  12/2013 resulting in ischemic cardiomyopathy   HPI 49 yo M ex-smoker with CAD s/p quadruple bypass, CHF  1. CAD: followed by cardiology. Taking  ASA., plavix, lipitor. Has quit smoking compliant with low salt diet. Still with CP that is moderate sternal pain. Non radiating.   2. CHF: taking lasix and metoprolol. Has leg swelling. Has fatigue. Has SOB with exertion. Compliant with low salt diet.   3. L shoulder pain: anterior shoulder pain started  01/20/2014, two days after hospital discharge. No injury. Pain with arm abduction. Patient is L handed. No hand weakness. Patient has not tried tylenol. He is advised against NSAIDs due to CAD on ASA and plavix and CKD.   Soc Hx: former smoker, quit following heart attack and bypass surgery  Review of Systems  Constitutional: Positive for fatigue. Negative for fever and chills.  Respiratory: Positive for shortness of breath. Negative for cough.   Cardiovascular: Positive for chest pain and leg swelling. Negative for palpitations.  Neurological: Negative for dizziness and light-headedness.      Objective:   Physical Exam BP 129/84 mmHg  Pulse 57  Temp(Src) 98.2 F (36.8 C) (Oral)  Resp 18  Ht 5\' 9"  (1.753 m)  Wt 247 lb (112.038 kg)  BMI 36.46 kg/m2  SpO2 99%  Wt Readings from Last 3 Encounters:  08/16/14 247 lb (112.038 kg)  06/24/14 243 lb 6.4 oz (110.406 kg)  03/22/14 229 lb 12.8 oz (104.237 kg)   BP Readings from Last 3 Encounters:  08/16/14 129/84  06/24/14 142/80  03/22/14 150/88  General appearance: alert, cooperative and no distress Lungs: clear to auscultation bilaterally Chest wall: vertical sternal scar, mild TTP, slight keloid  Heart: regular rate and rhythm, S1, S2 normal, no murmur, click, rub or gallop Extremities: 1+ edema b/l legs Shoulder: L shoulder  with decreased ROM to 90 degrees of forward and lateral abduction, tenderness in anterior shoulder, normal grip strength,  Pulses: 2+ and symmetric     Assessment & Plan:

## 2014-08-17 ENCOUNTER — Telehealth: Payer: Self-pay | Admitting: *Deleted

## 2014-08-17 LAB — HIV ANTIBODY (ROUTINE TESTING W REFLEX): HIV: NONREACTIVE

## 2014-08-17 NOTE — Telephone Encounter (Signed)
Pt aware of results 

## 2014-08-17 NOTE — Telephone Encounter (Signed)
-----   Message from Boykin Nearing, MD sent at 08/17/2014  8:47 AM EDT ----- Screening HIV negative Cr 2.3 which is within range from 2-2.6 over last 6 months No longer anemic on CBC

## 2014-08-19 NOTE — Assessment & Plan Note (Signed)
Renal referral placed today

## 2014-08-19 NOTE — Assessment & Plan Note (Signed)
A: HTN: BP well controlled Med: compliant P: continue current regimen

## 2014-08-19 NOTE — Assessment & Plan Note (Signed)
Resolved

## 2014-08-19 NOTE — Assessment & Plan Note (Signed)
CHF resulting from CAD: Increase lasix to 40 mg twice daily, 6 hrs apart    SOB: could be CHF related, you had mild anemia in the hospital Plan: Increase lasix to 40 mg twice daily to keep extra fluid down  CBC today, renal referral given kidney impairment

## 2014-08-19 NOTE — Assessment & Plan Note (Signed)
L anterior shoulder pain: I suspect rotator cuff impingement Sports medicine referral placed for sports medicine ultrasound

## 2014-08-20 ENCOUNTER — Ambulatory Visit: Payer: Medicaid Other | Admitting: Family Medicine

## 2014-09-06 ENCOUNTER — Ambulatory Visit (INDEPENDENT_AMBULATORY_CARE_PROVIDER_SITE_OTHER): Payer: Medicaid Other | Admitting: Family Medicine

## 2014-09-06 ENCOUNTER — Ambulatory Visit: Payer: Medicaid Other | Admitting: Family Medicine

## 2014-09-06 ENCOUNTER — Encounter: Payer: Self-pay | Admitting: Family Medicine

## 2014-09-06 VITALS — BP 136/84 | HR 69 | Ht 70.0 in | Wt 230.0 lb

## 2014-09-06 DIAGNOSIS — M25512 Pain in left shoulder: Secondary | ICD-10-CM | POA: Diagnosis not present

## 2014-09-06 NOTE — Patient Instructions (Signed)
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 Limit lifting and overhead activities as much as possible. Heat 15 minutes at a time 3-4 times a day may help with movement and stiffness. Tylenol as needed for pain. Steroid injections in a series have been shown to help with pain and motion - let me know if you want to try one of these. Codman exercises (pendulum, wall walking or table slides, arm circles) - do 3 sets of 10 once or twice a day. Physical therapy for rotator cuff strengthening is a consideration Follow up in 6 weeks Call me if you want to try physical therapy or an injection though.

## 2014-09-08 DIAGNOSIS — M25512 Pain in left shoulder: Secondary | ICD-10-CM | POA: Insufficient documentation

## 2014-09-08 NOTE — Assessment & Plan Note (Signed)
2/2 adhesive capsulitis.  Discussed options.  He would like to start with home exercises which were shown today.  Heat regularly.  Consider physical therapy, cortisone injections.  F/u in 6 weeks.

## 2014-09-08 NOTE — Progress Notes (Signed)
PCP: Minerva Ends, MD  Subjective:   HPI: Patient is a 49 y.o. male here for left shoulder pain.  Patient reports ever since a couple days following his bypass surgery in November 2015 he's had problems with left shoulder motion, pain. No known injury or trauma. No swelling. Is left handed. Tried to stretch only. Pain up to 10/10 with movement. No prior issues with this shoulder.  Past Medical History  Diagnosis Date  . Hypertension     a. Dx ~ 2000.  Marland Kitchen Atrial fibrillation with RVR     a. 12/2013 post-op CABG ->on amio.  Marland Kitchen CAD (coronary artery disease)     a. 12/2013 NSTEMI/Cath: 3VD;  b. 12/2013 CABG x 4: LIMA->LAD, VG->RI, VG->OM1->LPDA.  . Ischemic cardiomyopathy     a. 12/2013 Echo: EF 45%, Gr 2 DD, basal-mid inflat and inf AK, mild MR.  . Chronic combined systolic and diastolic CHF (congestive heart failure)     a. 12/2013 Echo: EF 45%, Gr 2 DD.  Marland Kitchen CKD (chronic kidney disease), stage III   . Tobacco abuse     Current Outpatient Prescriptions on File Prior to Visit  Medication Sig Dispense Refill  . amLODipine (NORVASC) 10 MG tablet TAKE 1 TABLET BY MOUTH DAILY. 30 tablet 5  . aspirin EC 81 MG tablet Take 1 tablet (81 mg total) by mouth daily. 90 tablet 3  . atorvastatin (LIPITOR) 40 MG tablet TAKE 1 TABLET BY MOUTH DAILY AT 6 PM. 30 tablet 5  . clopidogrel (PLAVIX) 75 MG tablet Take 1 tablet (75 mg total) by mouth daily. 30 tablet 1  . docusate sodium (COLACE) 100 MG capsule Take 1 capsule (100 mg total) by mouth 2 (two) times daily. 30 capsule 0  . furosemide (LASIX) 40 MG tablet Take 1 tablet (40 mg total) by mouth 2 (two) times daily. 60 tablet 3  . metoprolol tartrate (LOPRESSOR) 25 MG tablet TAKE 1 TABLET BY MOUTH 2 TIMES DAILY. 60 tablet 5  . nitroGLYCERIN (NITROSTAT) 0.4 MG SL tablet Place 1 tablet (0.4 mg total) under the tongue every 5 (five) minutes as needed for chest pain. 90 tablet 3  . potassium chloride SA (K-DUR,KLOR-CON) 20 MEQ tablet Take 2  tablets (40 mEq total) by mouth daily. 60 tablet 11   No current facility-administered medications on file prior to visit.    Past Surgical History  Procedure Laterality Date  . None    . Coronary artery bypass graft N/A 01/01/2014    Procedure: CORONARY ARTERY BYPASS GRAFTING (CABG) times four using left internal mammary artery and right saphenous vein;  Surgeon: Gaye Pollack, MD;  Location: [facility] OR;  Service: Open Heart Surgery;  Laterality: N/A;  . Intraoperative transesophageal echocardiogram N/A 01/01/2014    Procedure: INTRAOPERATIVE TRANSESOPHAGEAL ECHOCARDIOGRAM;  Surgeon: Gaye Pollack, MD;  Location: [facility] OR;  Service: Open Heart Surgery;  Laterality: N/A;  . Left heart catheterization with coronary angiogram N/A 12/29/2013    Procedure: LEFT HEART CATHETERIZATION WITH CORONARY ANGIOGRAM;  Surgeon: Sinclair Grooms, MD;  Location: [facility] CATH LAB;  Service: Cardiovascular;  Laterality: N/A;    Allergies  Allergen Reactions  . Shellfish Allergy Anaphylaxis    Throat close up, swelling    History   Social History  . Marital Status: Single    Spouse Name: N/A  . Number of Children: 0  . Years of Education: 12   Occupational History  . Unemployed    Social History Main Topics  . Smoking  status: Former Smoker -- 0.50 packs/day for 20 years    Types: Cigarettes    Quit date: 12/24/2013  . Smokeless tobacco: Never Used  . Alcohol Use: No     Comment: socially, 1-2 x year  . Drug Use: No  . Sexual Activity: Yes    Birth Control/ Protection: None   Other Topics Concern  . Not on file   Social History Narrative   Lives with fiance.   From [place], originally.   Moved to [place] in 2012.     Family History  Problem Relation Age of Onset  . Heart disease Mother 28  . Hypertension Father 33  . Asthma Father   . Diabetes Paternal Grandfather   . Cancer Neg Hx     BP 136/84 mmHg  Pulse 69  Ht 5\' 10"  (1.778 m)  Wt 230 lb (104.327 kg)  BMI 33.00  kg/m2  Review of Systems: See HPI above.    Objective:  Physical Exam:  Gen: NAD  Left shoulder: No swelling, ecchymoses.  No gross deformity. No TTP. ROM limited to 40 degrees ER, 80 degrees flexion, 90 degrees abduction. Positive Hawkins, Neers. Negative Speeds, Yergasons. Strength 5/5 with empty can and resisted internal/external rotation. Negative apprehension. NV intact distally.    Assessment & Plan:  1. Left shoulder pain - 2/2 adhesive capsulitis.  Discussed options.  He would like to start with home exercises which were shown today.  Heat regularly.  Consider physical therapy, cortisone injections.  F/u in 6 weeks.

## 2014-09-13 ENCOUNTER — Ambulatory Visit: Payer: Medicaid Other | Attending: Family Medicine | Admitting: *Deleted

## 2014-09-13 VITALS — BP 137/82 | HR 59 | Temp 98.3°F | Resp 18 | Ht 69.0 in | Wt 249.0 lb

## 2014-09-13 DIAGNOSIS — Z87891 Personal history of nicotine dependence: Secondary | ICD-10-CM | POA: Diagnosis not present

## 2014-09-13 DIAGNOSIS — I10 Essential (primary) hypertension: Secondary | ICD-10-CM | POA: Insufficient documentation

## 2014-09-13 DIAGNOSIS — I5042 Chronic combined systolic (congestive) and diastolic (congestive) heart failure: Secondary | ICD-10-CM | POA: Insufficient documentation

## 2014-09-13 LAB — BASIC METABOLIC PANEL
BUN: 20 mg/dL (ref 7–25)
CALCIUM: 9.8 mg/dL (ref 8.6–10.3)
CO2: 27 mmol/L (ref 20–31)
CREATININE: 1.89 mg/dL — AB (ref 0.60–1.35)
Chloride: 107 mmol/L (ref 98–110)
Glucose, Bld: 103 mg/dL — ABNORMAL HIGH (ref 65–99)
Potassium: 5.2 mmol/L (ref 3.5–5.3)
Sodium: 145 mmol/L (ref 135–146)

## 2014-09-13 NOTE — Patient Instructions (Signed)
DASH Eating Plan °DASH stands for "Dietary Approaches to Stop Hypertension." The DASH eating plan is a healthy eating plan that has been shown to reduce high blood pressure (hypertension). Additional health benefits may include reducing the risk of type 2 diabetes mellitus, heart disease, and stroke. The DASH eating plan may also help with weight loss. °WHAT DO I NEED TO KNOW ABOUT THE DASH EATING PLAN? °For the DASH eating plan, you will follow these general guidelines: °· Choose foods with a percent daily value for sodium of less than 5% (as listed on the food label). °· Use salt-free seasonings or herbs instead of table salt or sea salt. °· Check with your health care provider or pharmacist before using salt substitutes. °· Eat lower-sodium products, often labeled as "lower sodium" or "no salt added." °· Eat fresh foods. °· Eat more vegetables, fruits, and low-fat dairy products. °· Choose whole grains. Look for the word "whole" as the first word in the ingredient list. °· Choose fish and skinless chicken or turkey more often than red meat. Limit fish, poultry, and meat to 6 oz (170 g) each day. °· Limit sweets, desserts, sugars, and sugary drinks. °· Choose heart-healthy fats. °· Limit cheese to 1 oz (28 g) per day. °· Eat more home-cooked food and less restaurant, buffet, and fast food. °· Limit fried foods. °· Cook foods using methods other than frying. °· Limit canned vegetables. If you do use them, rinse them well to decrease the sodium. °· When eating at a restaurant, ask that your food be prepared with less salt, or no salt if possible. °WHAT FOODS CAN I EAT? °Seek help from a dietitian for individual calorie needs. °Grains °Whole grain or whole wheat bread. Brown rice. Whole grain or whole wheat pasta. Quinoa, bulgur, and whole grain cereals. Low-sodium cereals. Corn or whole wheat flour tortillas. Whole grain cornbread. Whole grain crackers. Low-sodium crackers. °Vegetables °Fresh or frozen vegetables  (raw, steamed, roasted, or grilled). Low-sodium or reduced-sodium tomato and vegetable juices. Low-sodium or reduced-sodium tomato sauce and paste. Low-sodium or reduced-sodium canned vegetables.  °Fruits °All fresh, canned (in natural juice), or frozen fruits. °Meat and Other Protein Products °Ground beef (85% or leaner), grass-fed beef, or beef trimmed of fat. Skinless chicken or turkey. Ground chicken or turkey. Pork trimmed of fat. All fish and seafood. Eggs. Dried beans, peas, or lentils. Unsalted nuts and seeds. Unsalted canned beans. °Dairy °Low-fat dairy products, such as skim or 1% milk, 2% or reduced-fat cheeses, low-fat ricotta or cottage cheese, or plain low-fat yogurt. Low-sodium or reduced-sodium cheeses. °Fats and Oils °Tub margarines without trans fats. Light or reduced-fat mayonnaise and salad dressings (reduced sodium). Avocado. Safflower, olive, or canola oils. Natural peanut or almond butter. °Other °Unsalted popcorn and pretzels. °The items listed above may not be a complete list of recommended foods or beverages. Contact your dietitian for more options. °WHAT FOODS ARE NOT RECOMMENDED? °Grains °White bread. White pasta. White rice. Refined cornbread. Bagels and croissants. Crackers that contain trans fat. °Vegetables °Creamed or fried vegetables. Vegetables in a cheese sauce. Regular canned vegetables. Regular canned tomato sauce and paste. Regular tomato and vegetable juices. °Fruits °Dried fruits. Canned fruit in light or heavy syrup. Fruit juice. °Meat and Other Protein Products °Fatty cuts of meat. Ribs, chicken wings, bacon, sausage, bologna, salami, chitterlings, fatback, hot dogs, bratwurst, and packaged luncheon meats. Salted nuts and seeds. Canned beans with salt. °Dairy °Whole or 2% milk, cream, half-and-half, and cream cheese. Whole-fat or sweetened yogurt. Full-fat   cheeses or blue cheese. Nondairy creamers and whipped toppings. Processed cheese, cheese spreads, or cheese  curds. °Condiments °Onion and garlic salt, seasoned salt, table salt, and sea salt. Canned and packaged gravies. Worcestershire sauce. Tartar sauce. Barbecue sauce. Teriyaki sauce. Soy sauce, including reduced sodium. Steak sauce. Fish sauce. Oyster sauce. Cocktail sauce. Horseradish. Ketchup and mustard. Meat flavorings and tenderizers. Bouillon cubes. Hot sauce. Tabasco sauce. Marinades. Taco seasonings. Relishes. °Fats and Oils °Butter, stick margarine, lard, shortening, ghee, and bacon fat. Coconut, palm kernel, or palm oils. Regular salad dressings. °Other °Pickles and olives. Salted popcorn and pretzels. °The items listed above may not be a complete list of foods and beverages to avoid. Contact your dietitian for more information. °WHERE CAN I FIND MORE INFORMATION? °National Heart, Lung, and Blood Institute: www.nhlbi.nih.gov/health/health-topics/topics/dash/ °Document Released: 01/25/2011 Document Revised: 06/22/2013 Document Reviewed: 12/10/2012 °ExitCare® Patient Information ©2015 [employer]. This information is not intended to replace advice given to you by your health care provider. Make sure you discuss any questions you have with your health care provider. ° °

## 2014-09-13 NOTE — Progress Notes (Signed)
Patient presents for BMP, Weight and BP check Med list reviewed; states taking all meds as directed Has not needed to take NTG Patient is not adding salt to foods or cooking with salt. Patient made aware of Mrs Deliah Boston as alternative to salt. Encouraged patient to choose foods with 5% or less of daily value for sodium. Patient is walking 3000-6000 steps daily for exercise as tolerated Patient denies  headaches, blurred vision, chest pain  Positive for SHOB walking from car to clinic Unable to stand for long periods due to tiring easily Unable to sit for long periods due to LE edema. Discussed keeping legs elevated when sitting Patient has home BP monitoring device. Patient instructed on most accurate way to take BP, to keep BP log with date and time and bring log to all future visits. Patient will also keep log of daily weights after first morning void   Filed Vitals:   09/13/14 1041  BP: 137/82  Pulse: 59  Temp: 98.3 F (36.8 C)  Resp: 18      Patient advised to call for med refills at least 7 days before running out so as not to go without.  Patient aware that he is to f/u with PCP 6 weeks from last visit (Due 09/27/14)  Patient given literature on [facility]

## 2014-09-14 ENCOUNTER — Encounter: Payer: Medicaid Other | Admitting: Pharmacist

## 2014-09-16 ENCOUNTER — Telehealth: Payer: Self-pay | Admitting: *Deleted

## 2014-09-16 NOTE — Telephone Encounter (Signed)
-----   Message from Boykin Nearing, MD sent at 09/14/2014  9:16 AM EDT ----- Creatinine improved  Continue current regimen

## 2014-09-20 NOTE — Telephone Encounter (Signed)
Pt aware of results 

## 2014-09-27 ENCOUNTER — Ambulatory Visit: Payer: Medicaid Other | Attending: Family Medicine | Admitting: Family Medicine

## 2014-09-27 ENCOUNTER — Encounter: Payer: Self-pay | Admitting: Family Medicine

## 2014-09-27 VITALS — BP 134/84 | HR 63 | Temp 98.2°F | Resp 16 | Ht 70.0 in | Wt 245.0 lb

## 2014-09-27 DIAGNOSIS — I10 Essential (primary) hypertension: Secondary | ICD-10-CM | POA: Diagnosis not present

## 2014-09-27 DIAGNOSIS — I5042 Chronic combined systolic (congestive) and diastolic (congestive) heart failure: Secondary | ICD-10-CM | POA: Diagnosis not present

## 2014-09-27 DIAGNOSIS — I509 Heart failure, unspecified: Secondary | ICD-10-CM | POA: Insufficient documentation

## 2014-09-27 DIAGNOSIS — Z87891 Personal history of nicotine dependence: Secondary | ICD-10-CM | POA: Diagnosis not present

## 2014-09-27 DIAGNOSIS — R0602 Shortness of breath: Secondary | ICD-10-CM | POA: Insufficient documentation

## 2014-09-27 DIAGNOSIS — M25512 Pain in left shoulder: Secondary | ICD-10-CM | POA: Insufficient documentation

## 2014-09-27 DIAGNOSIS — I2510 Atherosclerotic heart disease of native coronary artery without angina pectoris: Secondary | ICD-10-CM

## 2014-09-27 DIAGNOSIS — N183 Chronic kidney disease, stage 3 unspecified: Secondary | ICD-10-CM

## 2014-09-27 DIAGNOSIS — I255 Ischemic cardiomyopathy: Secondary | ICD-10-CM

## 2014-09-27 LAB — BASIC METABOLIC PANEL
BUN: 19 mg/dL (ref 7–25)
CO2: 27 mmol/L (ref 20–31)
Calcium: 9.8 mg/dL (ref 8.6–10.3)
Chloride: 105 mmol/L (ref 98–110)
Creat: 2.23 mg/dL — ABNORMAL HIGH (ref 0.60–1.35)
GLUCOSE: 102 mg/dL — AB (ref 65–99)
Potassium: 5 mmol/L (ref 3.5–5.3)
Sodium: 142 mmol/L (ref 135–146)

## 2014-09-27 LAB — VITAMIN B12: Vitamin B-12: 418 pg/mL (ref 211–911)

## 2014-09-27 NOTE — Patient Instructions (Addendum)
Mr. Olmsted,  Thank you for coming in today  1. BP at at goal, continue current regimen  2. Weight is stable with just slight leg swelling:  Continue lasix twice daily Checking BMP today  3. Persistent SOB- deconditioning suspected Oxygen levels are normal Continue to eat well Slowly increase exercise Referral to cardiac rehab placed in order to help get you back to your previous strength and endurace  F/u with me in 3 months  Dr. Adrian Blackwater

## 2014-09-27 NOTE — Progress Notes (Signed)
F/U HTN CHF No tobacco user since 2015

## 2014-09-27 NOTE — Assessment & Plan Note (Signed)
A: BP at goal Med: compliant P: Continue current regimen

## 2014-09-27 NOTE — Assessment & Plan Note (Signed)
A: CAD s/p CABG with persistent SOB and poor exercise tolerance, O2 sats normal P: Referral to cardiac rehab

## 2014-09-27 NOTE — Progress Notes (Signed)
   Subjective:    Patient ID: Seth Keller, male    DOB: [birth year], 49 y.o.   MRN: [id number] CC: f/u CHF  HPI 49 yo M presents for f/u appt:  1. CHF: patient compliant with increased lasix dose of 40 mg BID. He denies change in SOB. He has minimal leg swelling. No dizziness or lightheadedness. No smoker. Hgb recheck at last OV was normal.   2. HTN: compliant with medication regimen. Has quit smoking. No ETOH.   3. L shoulder pain: evaluated by sports medicine. Has opted for home PT. Pain is slightly better in L shoulder. No recent injury.   4. Apply for disability: still awaiting decision. 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 Still unable to perform the same work due to weakness, CP and SOB following NSTEMI and CABG on 01/01/2014.   History  Substance Use Topics  . Smoking status: Former Smoker -- 0.50 packs/day for 20 years    Types: Cigarettes    Quit date: 12/24/2013  . Smokeless tobacco: Never Used  . Alcohol Use: No     Comment: socially, 1-2 x year   Review of Systems  Constitutional: Negative for fever, chills, fatigue and unexpected weight change.  Eyes: Negative for visual disturbance.  Respiratory: Positive for shortness of breath. Negative for cough.   Cardiovascular: Positive for chest pain (chest wall pain, substernal ). Negative for palpitations and leg swelling.  Gastrointestinal: Negative for nausea, vomiting, abdominal pain, diarrhea, constipation and blood in stool.  Endocrine: Negative for polydipsia, polyphagia and polyuria.  Musculoskeletal: Negative for myalgias, back pain, arthralgias, gait problem and neck pain.  Skin: Negative for rash.  Allergic/Immunologic: Negative for immunocompromised state.  Hematological: Negative for adenopathy. Does not bruise/bleed easily.  Psychiatric/Behavioral: Negative for suicidal ideas, sleep disturbance and dysphoric mood. The patient is not nervous/anxious.   GAD-7 score of 4. 1-2,2,4,6.    Objective:   Physical Exam  Constitutional: He appears well-developed and well-nourished. No distress.  HENT:  Head: Normocephalic and atraumatic.  Neck: Normal range of motion. Neck supple.  Cardiovascular: Normal rate, regular rhythm, normal heart sounds and intact distal pulses.   Pulmonary/Chest: Effort normal and breath sounds normal.  Musculoskeletal: He exhibits no edema.  Neurological: He is alert.  Skin: Skin is warm and dry. No rash noted. No erythema.  Psychiatric: He has a normal mood and affect.  BP 134/84 mmHg  Pulse 63  Temp(Src) 98.2 F (36.8 C) (Oral)  Resp 16  Ht 5\' 10"  (1.778 m)  Wt 245 lb (111.131 kg)  BMI 35.15 kg/m2  SpO2 98%  Wt Readings from Last 3 Encounters:  09/27/14 245 lb (111.131 kg)  09/13/14 249 lb (112.946 kg)  09/06/14 230 lb (104.327 kg)   BP Readings from Last 3 Encounters:  09/27/14 134/84  09/13/14 137/82  09/06/14 136/84      Assessment & Plan:

## 2014-09-27 NOTE — Assessment & Plan Note (Signed)
A; suspected adhesive capsulitis per sports medicine. Patient doing home exercise P: continue conservative management

## 2014-09-28 ENCOUNTER — Telehealth: Payer: Self-pay | Admitting: *Deleted

## 2014-09-28 LAB — VITAMIN D 25 HYDROXY (VIT D DEFICIENCY, FRACTURES): Vit D, 25-Hydroxy: 21 ng/mL — ABNORMAL LOW (ref 30–100)

## 2014-09-28 NOTE — Assessment & Plan Note (Signed)
A: Cr on the rise, BP at gaol P: Renal referral placed again

## 2014-09-28 NOTE — Telephone Encounter (Signed)
-----   Message from Boykin Nearing, MD sent at 09/28/2014  9:02 AM EDT ----- Vit D insufficiency take vit D3 2K U daily Normal B12 Creatinine a bit elevated on BMP, another renal referral has been placed

## 2014-09-28 NOTE — Addendum Note (Signed)
Addended by: Boykin Nearing on: 09/28/2014 09:03 AM   Modules accepted: Orders

## 2014-09-29 NOTE — Telephone Encounter (Signed)
Pt aware of results  Advised to take Vit D3 2000 unit daily

## 2014-09-29 NOTE — Telephone Encounter (Signed)
-----   Message from Boykin Nearing, MD sent at 09/28/2014  9:02 AM EDT ----- Vit D insufficiency take vit D3 2K U daily Normal B12 Creatinine a bit elevated on BMP, another renal referral has been placed

## 2014-10-05 ENCOUNTER — Encounter: Payer: Self-pay | Admitting: Pharmacist

## 2014-10-29 ENCOUNTER — Encounter: Payer: Self-pay | Admitting: Cardiology

## 2014-10-29 ENCOUNTER — Ambulatory Visit (INDEPENDENT_AMBULATORY_CARE_PROVIDER_SITE_OTHER): Payer: Medicaid Other | Admitting: Cardiology

## 2014-10-29 VITALS — BP 117/80 | HR 72 | Ht 70.0 in | Wt 254.0 lb

## 2014-10-29 DIAGNOSIS — N183 Chronic kidney disease, stage 3 unspecified: Secondary | ICD-10-CM

## 2014-10-29 DIAGNOSIS — I10 Essential (primary) hypertension: Secondary | ICD-10-CM | POA: Diagnosis not present

## 2014-10-29 DIAGNOSIS — I2510 Atherosclerotic heart disease of native coronary artery without angina pectoris: Secondary | ICD-10-CM | POA: Diagnosis not present

## 2014-10-29 DIAGNOSIS — I255 Ischemic cardiomyopathy: Secondary | ICD-10-CM

## 2014-10-29 DIAGNOSIS — Z6836 Body mass index (BMI) 36.0-36.9, adult: Secondary | ICD-10-CM | POA: Diagnosis not present

## 2014-10-29 DIAGNOSIS — E785 Hyperlipidemia, unspecified: Secondary | ICD-10-CM

## 2014-10-29 DIAGNOSIS — I4891 Unspecified atrial fibrillation: Secondary | ICD-10-CM

## 2014-10-29 NOTE — Patient Instructions (Addendum)
Medication Instructions:  Your physician recommends that you continue on your current medications as directed. Please refer to the Current Medication list given to you today.   Labwork: LIPID CMET  IN 2 MONTHS   Testing/Procedures: None ordered  Follow-Up: Your physician wants you to follow-up in: [place].  You will receive a reminder letter in the mail two months in advance. If you don't receive a letter, please call our office to schedule the follow-up appointment.

## 2014-10-29 NOTE — Progress Notes (Signed)
Cardiology Office Note   Date:  10/29/2014   ID:  Seth Keller, DOB [birth year], MRN [id number]  PCP:  Minerva Ends, MD  Cardiologist:  Dr. Angelena Form    Chief Complaint  Patient presents with  . Congestive Heart Failure    recent increase of lasix      History of Present Illness: Seth Keller is a 49 y.o. male who presents for increase episodes of HF with increase of lasix. Also with ckd-3 but similar to previous labs.   He has a history of CAD s/p 4V CABG November 2015, HTN, HLD, ischemic cardiomyopathy here today for cardiac follow up. He was admitted to [facility] with chest pain and dyspnea and ruled in for NSTEMI 12/26/13. Cardiac cath showed severe three vessel CAD. Echo showed LV dysfunction with an EF of 45%. He underwent CABG x 4. Post-op course was complicated by atrial fibrillation and he was placed on amiodarone with conversion to sinus rhythm. Amiodarone was stopped at his last visit in our office 03/22/14.   Today no further episodes of a fib that he is aware of.  Still with DOE and chest wall pain with turning in bed.  He walks around his apartment inside, too hot outside.  He denies any salt except for occ fast food.  I reviewed salt in most foods, including cheese.  Plan for increase of exercise 10 min BID.  Last echo 03/2014 with no acute changes, EF 45-50%.   Past Medical History  Diagnosis Date  . Hypertension     a. Dx ~ 2000.  Marland Kitchen Atrial fibrillation with RVR     a. 12/2013 post-op CABG ->on amio.  Marland Kitchen CAD (coronary artery disease)     a. 12/2013 NSTEMI/Cath: 3VD;  b. 12/2013 CABG x 4: LIMA->LAD, VG->RI, VG->OM1->LPDA.  . Ischemic cardiomyopathy     a. 12/2013 Echo: EF 45%, Gr 2 DD, basal-mid inflat and inf AK, mild MR.  . Chronic combined systolic and diastolic CHF (congestive heart failure)     a. 12/2013 Echo: EF 45%, Gr 2 DD.  Marland Kitchen CKD (chronic kidney disease), stage III   . Tobacco abuse     Past Surgical History  Procedure Laterality Date  . None    . Coronary  artery bypass graft N/A 01/01/2014    Procedure: CORONARY ARTERY BYPASS GRAFTING (CABG) times four using left internal mammary artery and right saphenous vein;  Surgeon: Gaye Pollack, MD;  Location: [place] OR;  Service: Open Heart Surgery;  Laterality: N/A;  . Intraoperative transesophageal echocardiogram N/A 01/01/2014    Procedure: INTRAOPERATIVE TRANSESOPHAGEAL ECHOCARDIOGRAM;  Surgeon: Gaye Pollack, MD;  Location: [facility] OR;  Service: Open Heart Surgery;  Laterality: N/A;  . Left heart catheterization with coronary angiogram N/A 12/29/2013    Procedure: LEFT HEART CATHETERIZATION WITH CORONARY ANGIOGRAM;  Surgeon: Sinclair Grooms, MD;  Location: [facility] CATH LAB;  Service: Cardiovascular;  Laterality: N/A;     Current Outpatient Prescriptions  Medication Sig Dispense Refill  . amLODipine (NORVASC) 10 MG tablet TAKE 1 TABLET BY MOUTH DAILY. 30 tablet 5  . aspirin EC 81 MG tablet Take 1 tablet (81 mg total) by mouth daily. 90 tablet 3  . atorvastatin (LIPITOR) 40 MG tablet TAKE 1 TABLET BY MOUTH DAILY AT 6 PM. 30 tablet 5  . clopidogrel (PLAVIX) 75 MG tablet Take 1 tablet (75 mg total) by mouth daily. 30 tablet 1  . docusate sodium (COLACE) 100 MG capsule Take 1 capsule (100 mg total) by  mouth 2 (two) times daily. 30 capsule 0  . furosemide (LASIX) 40 MG tablet Take 1 tablet (40 mg total) by mouth 2 (two) times daily. 60 tablet 3  . metoprolol tartrate (LOPRESSOR) 25 MG tablet TAKE 1 TABLET BY MOUTH 2 TIMES DAILY. 60 tablet 5  . nitroGLYCERIN (NITROSTAT) 0.4 MG SL tablet Place 1 tablet (0.4 mg total) under the tongue every 5 (five) minutes as needed for chest pain. 90 tablet 3  . potassium chloride SA (K-DUR,KLOR-CON) 20 MEQ tablet Take 2 tablets (40 mEq total) by mouth daily. 60 tablet 11   No current facility-administered medications for this visit.    Allergies:   Shellfish allergy    Social History:  The patient  reports that he quit smoking about 10 months ago. His smoking use included  Cigarettes. He has a 10 pack-year smoking history. He has never used smokeless tobacco. He reports that he does not drink alcohol or use illicit drugs.   Family History:  The patient's family history includes Asthma in his father; Diabetes in his paternal grandfather; Heart disease (age of onset: 62) in his mother; Hypertension (age of onset: 25) in his father. There is no history of Cancer.    ROS:  General:no colds or fevers, down 4 lbs since July but up 15 pounds from Feb last year.   Skin:no rashes or ulcers HEENT:no blurred vision, no congestion CV:see HPI PUL:see HPI GI:no diarrhea constipation or melena, no indigestion GU:no hematuria, no dysuria MS:no joint pain, no claudication Neuro:no syncope, no lightheadedness Endo:no diabetes, no thyroid disease  Wt Readings from Last 3 Encounters:  10/29/14 254 lb (115.214 kg)  09/27/14 245 lb (111.131 kg)  09/13/14 249 lb (112.946 kg)     PHYSICAL EXAM: VS:  BP 117/80 mmHg  Pulse 72  Ht 5\' 10"  (1.778 m)  Wt 254 lb (115.214 kg)  BMI 36.45 kg/m2 , BMI Body mass index is 36.45 kg/(m^2). General:Pleasant affect, NAD Skin:Warm and dry, brisk capillary refill HEENT:normocephalic, sclera clear, mucus membranes moist Neck:supple, no JVD, no bruits  Heart:S1S2 RRR without murmur, gallup, rub or click, chest wall with kelloids along incision. - chest pain to palpation  Lungs:clear without rales, rhonchi, or wheezes [id number], non tender, + BS, do not palpate liver spleen or masses Ext:no lower ext edema, 2+ pedal pulses, 2+ radial pulses Neuro:alert and oriented X 3, MAE, follows commands, + facial symmetry    EKG:  EKG is ordered today. The ekg ordered today demonstrates SR LAFB no changes from previous EKG.   Recent Labs: 12/26/2013: Pro B Natriuretic peptide (BNP) 6298.0*; TSH 1.070 01/02/2014: Magnesium 2.7* 03/22/2014: ALT 19 08/16/2014: Hemoglobin 13.7; Platelets 252 09/27/2014: BUN 19; Creat 2.23*; Potassium 5.0; Sodium 142     Lipid Panel    Component Value Date/Time   CHOL 147 03/22/2014 0912   TRIG 93.0 03/22/2014 0912   HDL 38.60* 03/22/2014 0912   CHOLHDL 4 03/22/2014 0912   VLDL 18.6 03/22/2014 0912   LDLCALC 90 03/22/2014 0912   LDLDIRECT 198* 05/29/2012 1744       Other studies Reviewed: Additional studies/ records that were reviewed today include: ECHO 2.2016. Study Conclusions  - Left ventricle: The cavity size was normal. Wall thickness was increased in a pattern of moderate LVH. Systolic function was mildly reduced. The estimated ejection fraction was in the range of 45% to 50%. Inferolateral, basal inferior, and basal anterolateral hypokinesis. Features are consistent with a pseudonormal left ventricular filling pattern, with concomitant abnormal relaxation and increased  filling pressure (grade 2 diastolic dysfunction). - Aortic valve: There was no stenosis. - Mitral valve: Mildly calcified annulus. Mildly calcified leaflets . There was trivial regurgitation. - Left atrium: The atrium was mildly dilated. - Right ventricle: Systolic function was mildly reduced. - Right atrium: The atrium was mildly dilated. - Tricuspid valve: Peak RV-RA gradient (S): 18 mm Hg. - Pulmonary arteries: PA peak pressure: 21 mm Hg (S). - Inferior vena cava: The vessel was normal in size. The respirophasic diameter changes were in the normal range (= 50%), consistent with normal central venous pressure.  Impressions:  - Normal LV size with moderate LV hypertrophy. EF 45-50%. Wall motion abnormalities as noted above. Difficult images, echo contrast would probably have been helpful. Normal RV size with mildly decreased systolic function. No significant valvular abnormalities.  ASSESSMENT AND PLAN:  1. CAD: s/p CABG November 2015. He is doing well, though continues with chest wall pain with turning and has keloids along incision. . Will continue ASA, Plavix, statin, beta  blocker. Follow up with Dr. Angelena Form in 6 months.   2. Ischemic cardiomyopathy: LVEF 45% by echo march 2016.   3. HTN: BP controlled. No changes.   4. Tobacco abuse, in remission: He has not started back smoking.   5. Atrial fib, post-op: Sinus today. No awareness of irregular heart beat.   6. Acute on chronic systolic CHF: Lasix 40 mg has been increased to BID with some wt loss.  No edema currently.  Continue lasix.   7 hyperlipidemia continue statin and will recheck lipids CMP in 2 months.    8. CKD-3 to be scheduled for renal, he is on list.  Explained importance.  9.   Obese- discussed importance of diet and exercise to prevent diabetes and improve overall health.   10. Disability papers are supposed to be in system.  Will check on it when he checks out.      Current medicines are reviewed with the patient today.  The patient Has no concerns regarding medicines.  The following changes have been made:  See above Labs/ tests ordered today include:see above  Disposition:   FU:  see above  Signed, Isaiah Serge, NP  10/29/2014 8:25 AM    [facility] [place] [place], [place] Phone: [phone]; Fax: [phone]

## 2014-12-24 ENCOUNTER — Other Ambulatory Visit: Payer: Self-pay | Admitting: Nephrology

## 2014-12-24 DIAGNOSIS — I129 Hypertensive chronic kidney disease with stage 1 through stage 4 chronic kidney disease, or unspecified chronic kidney disease: Secondary | ICD-10-CM

## 2014-12-24 DIAGNOSIS — R809 Proteinuria, unspecified: Secondary | ICD-10-CM

## 2014-12-24 DIAGNOSIS — N189 Chronic kidney disease, unspecified: Secondary | ICD-10-CM

## 2014-12-24 DIAGNOSIS — N183 Chronic kidney disease, stage 3 unspecified: Secondary | ICD-10-CM

## 2014-12-24 DIAGNOSIS — N2581 Secondary hyperparathyroidism of renal origin: Secondary | ICD-10-CM

## 2014-12-24 DIAGNOSIS — D631 Anemia in chronic kidney disease: Secondary | ICD-10-CM

## 2014-12-29 ENCOUNTER — Other Ambulatory Visit: Payer: Medicaid Other

## 2014-12-31 ENCOUNTER — Other Ambulatory Visit: Payer: Self-pay | Admitting: Nurse Practitioner

## 2015-01-10 ENCOUNTER — Other Ambulatory Visit: Payer: Self-pay | Admitting: Cardiovascular Disease

## 2015-01-10 ENCOUNTER — Other Ambulatory Visit: Payer: Self-pay | Admitting: *Deleted

## 2015-01-10 ENCOUNTER — Telehealth: Payer: Self-pay | Admitting: Family Medicine

## 2015-01-10 MED ORDER — AMLODIPINE BESYLATE 10 MG PO TABS: 10.0000 mg | ORAL_TABLET | Freq: Every day | ORAL | Status: DC

## 2015-01-10 NOTE — Telephone Encounter (Signed)
Patient called requesting a medication refill for amLODipine (NORVASC) Please follow up with patient

## 2015-01-10 NOTE — Telephone Encounter (Signed)
LVM Rx requested send to [facility]

## 2015-01-11 ENCOUNTER — Other Ambulatory Visit (INDEPENDENT_AMBULATORY_CARE_PROVIDER_SITE_OTHER): Payer: Medicaid Other | Admitting: *Deleted

## 2015-01-11 ENCOUNTER — Ambulatory Visit
Admission: RE | Admit: 2015-01-11 | Discharge: 2015-01-11 | Disposition: A | Discharge status: Home / Self Care | Payer: Medicaid Other | Source: Ambulatory Visit | Attending: Nephrology | Admitting: Nephrology

## 2015-01-11 DIAGNOSIS — N2581 Secondary hyperparathyroidism of renal origin: Secondary | ICD-10-CM

## 2015-01-11 DIAGNOSIS — N183 Chronic kidney disease, stage 3 unspecified: Secondary | ICD-10-CM

## 2015-01-11 DIAGNOSIS — R809 Proteinuria, unspecified: Secondary | ICD-10-CM

## 2015-01-11 DIAGNOSIS — I10 Essential (primary) hypertension: Secondary | ICD-10-CM

## 2015-01-11 DIAGNOSIS — D631 Anemia in chronic kidney disease: Secondary | ICD-10-CM

## 2015-01-11 DIAGNOSIS — I129 Hypertensive chronic kidney disease with stage 1 through stage 4 chronic kidney disease, or unspecified chronic kidney disease: Secondary | ICD-10-CM

## 2015-01-11 DIAGNOSIS — N189 Chronic kidney disease, unspecified: Secondary | ICD-10-CM

## 2015-01-11 LAB — COMPREHENSIVE METABOLIC PANEL
ALBUMIN: 4 g/dL (ref 3.6–5.1)
ALK PHOS: 89 U/L (ref 40–115)
ALT: 19 U/L (ref 9–46)
AST: 12 U/L (ref 10–40)
BILIRUBIN TOTAL: 0.3 mg/dL (ref 0.2–1.2)
BUN: 20 mg/dL (ref 7–25)
CALCIUM: 9.2 mg/dL (ref 8.6–10.3)
CO2: 25 mmol/L (ref 20–31)
Chloride: 106 mmol/L (ref 98–110)
Creat: 1.89 mg/dL — ABNORMAL HIGH (ref 0.60–1.35)
GLUCOSE: 96 mg/dL (ref 65–99)
POTASSIUM: 4.1 mmol/L (ref 3.5–5.3)
Sodium: 141 mmol/L (ref 135–146)
Total Protein: 7 g/dL (ref 6.1–8.1)

## 2015-01-11 LAB — LIPID PANEL
CHOL/HDL RATIO: 4.6 ratio (ref <=5.0)
Cholesterol: 152 mg/dL (ref 125–200)
HDL: 33 mg/dL — AB (ref >=40)
LDL CALC: 96 mg/dL (ref <130)
TRIGLYCERIDES: 115 mg/dL (ref <150)
VLDL: 23 mg/dL (ref <30)

## 2015-01-11 NOTE — Addendum Note (Signed)
Addended by: Eulis Foster on: 01/11/2015 09:57 AM   Modules accepted: Orders

## 2015-01-11 NOTE — Addendum Note (Signed)
Addended by: Eulis Foster on: 01/11/2015 09:58 AM   Modules accepted: Orders

## 2015-01-12 ENCOUNTER — Telehealth: Payer: Self-pay

## 2015-01-12 DIAGNOSIS — Z79899 Other long term (current) drug therapy: Secondary | ICD-10-CM

## 2015-01-12 MED ORDER — EZETIMIBE 10 MG PO TABS: 10.0000 mg | ORAL_TABLET | Freq: Every day | ORAL | Status: DC

## 2015-01-12 NOTE — Telephone Encounter (Signed)
Called patient about lab results. Per Cecilie Kicks NP, Please let pt know that cholesterol needs tighter control, add zetia 10 mg daily to work with lipitor. Recheck lipids hepatic panel in 46months. Patient verbalized understanding. Ordered new medication and labs. Patient coming in on 04/14/15 for lab work.

## 2015-01-12 NOTE — Telephone Encounter (Signed)
Called [employer] for prior auth of Zetia. Clinical info given. Sent to pharmacy review. I will call back on Monday for a determination. PA Ref [id number].

## 2015-01-17 ENCOUNTER — Telehealth: Payer: Self-pay

## 2015-01-17 NOTE — Telephone Encounter (Signed)
Coverage of Zetia has been denied. Rationale is that he has not tried and failed 2 formulary alternatives. Will let his local pharmacy know.

## 2015-01-18 ENCOUNTER — Other Ambulatory Visit: Payer: Self-pay

## 2015-01-18 MED ORDER — ATORVASTATIN CALCIUM 80 MG PO TABS: 80.0000 mg | ORAL_TABLET | Freq: Every day | ORAL | Status: DC

## 2015-01-18 NOTE — Telephone Encounter (Signed)
Then increase lipitor to 80 mg please and let pt know.

## 2015-01-18 NOTE — Telephone Encounter (Signed)
Patient advised of this. Will send new Rx for Lipitor 80mg  to [facility].

## 2015-01-24 ENCOUNTER — Telehealth: Payer: Self-pay | Admitting: Family Medicine

## 2015-01-24 NOTE — Telephone Encounter (Signed)
Pt. Called requesting a med refill on metoprolol tartrate (LOPRESSOR) 25 MG tablet. Pt. Would like Rx to be sent to [facility] on [address]. Please f/u with pt.

## 2015-01-25 ENCOUNTER — Other Ambulatory Visit: Payer: Self-pay | Admitting: *Deleted

## 2015-01-25 ENCOUNTER — Other Ambulatory Visit: Payer: Self-pay | Admitting: Surgical

## 2015-01-25 MED ORDER — METOPROLOL TARTRATE 25 MG PO TABS: ORAL_TABLET | ORAL | Status: DC

## 2015-01-25 NOTE — Telephone Encounter (Signed)
Refills send to [facility]

## 2015-03-10 ENCOUNTER — Telehealth: Payer: Self-pay | Admitting: Cardiovascular Disease

## 2015-03-10 NOTE — Telephone Encounter (Signed)
Spoke with pt. He is asking if Dr. Angelena Form would write another letter to support his disability claim.  Previous letter written in May 2016 and pt needs updated letter.  Pt reports he continues to have shortness of breath and swelling when standing for long periods of time.  He requests letter be faxed to Ms. Cameron--Fax-[phone].

## 2015-03-10 NOTE — Telephone Encounter (Signed)
Left message to call back  

## 2015-03-10 NOTE — Telephone Encounter (Signed)
Follow up  ° ° °Patient returning call back to nurse  °

## 2015-03-10 NOTE — Telephone Encounter (Signed)
New message      Talk to the nurse/doctor about his condition-----pt would not tell me any more info

## 2015-03-11 ENCOUNTER — Encounter: Payer: Self-pay | Admitting: Cardiovascular Disease

## 2015-03-11 NOTE — Telephone Encounter (Signed)
Letter faxed.  Message left on pt's identified voicemail that letter has been written and faxed.

## 2015-03-11 NOTE — Telephone Encounter (Signed)
Letter written. cdm 

## 2015-04-14 ENCOUNTER — Other Ambulatory Visit (INDEPENDENT_AMBULATORY_CARE_PROVIDER_SITE_OTHER): Payer: Medicaid Other | Admitting: *Deleted

## 2015-04-14 DIAGNOSIS — Z79899 Other long term (current) drug therapy: Secondary | ICD-10-CM

## 2015-04-14 LAB — LIPID PANEL
CHOLESTEROL: 138 mg/dL (ref 125–200)
HDL: 28 mg/dL — ABNORMAL LOW (ref >=40)
LDL Cholesterol: 88 mg/dL (ref <130)
Total CHOL/HDL Ratio: 4.9 Ratio (ref <=5.0)
Triglycerides: 108 mg/dL (ref <150)
VLDL: 22 mg/dL (ref <30)

## 2015-04-14 LAB — HEPATIC FUNCTION PANEL
ALK PHOS: 88 U/L (ref 40–115)
ALT: 27 U/L (ref 9–46)
AST: 19 U/L (ref 10–40)
Albumin: 3.8 g/dL (ref 3.6–5.1)
BILIRUBIN DIRECT: 0.1 mg/dL (ref <=0.2)
BILIRUBIN INDIRECT: 0.2 mg/dL (ref 0.2–1.2)
Total Bilirubin: 0.3 mg/dL (ref 0.2–1.2)
Total Protein: 7.2 g/dL (ref 6.1–8.1)

## 2015-05-19 ENCOUNTER — Telehealth: Payer: Self-pay | Admitting: Family Medicine

## 2015-05-19 MED ORDER — AMLODIPINE BESYLATE 10 MG PO TABS: 10.0000 mg | ORAL_TABLET | Freq: Every day | ORAL | Status: DC

## 2015-05-19 NOTE — Telephone Encounter (Signed)
Patient is requesting a refill for amLODipine (NORVASC) 10 MG tablet [id number]......please follow up.Marland KitchenMarland KitchenMarland Kitchen

## 2015-05-19 NOTE — Telephone Encounter (Signed)
Rx send to [facility]  

## 2015-05-30 ENCOUNTER — Ambulatory Visit: Payer: Medicaid Other | Admitting: Family Medicine

## 2015-06-01 ENCOUNTER — Other Ambulatory Visit: Payer: Self-pay | Admitting: Cardiovascular Disease

## 2015-06-16 ENCOUNTER — Encounter: Payer: Self-pay | Admitting: Family Medicine

## 2015-06-16 ENCOUNTER — Ambulatory Visit: Payer: Medicaid Other | Attending: Family Medicine | Admitting: Family Medicine

## 2015-06-16 ENCOUNTER — Encounter: Payer: Self-pay | Admitting: Clinical

## 2015-06-16 VITALS — BP 135/86 | HR 88 | Temp 98.4°F | Resp 16 | Ht 69.0 in | Wt 256.0 lb

## 2015-06-16 DIAGNOSIS — Z79899 Other long term (current) drug therapy: Secondary | ICD-10-CM | POA: Diagnosis not present

## 2015-06-16 DIAGNOSIS — E559 Vitamin D deficiency, unspecified: Secondary | ICD-10-CM | POA: Insufficient documentation

## 2015-06-16 DIAGNOSIS — I255 Ischemic cardiomyopathy: Secondary | ICD-10-CM | POA: Insufficient documentation

## 2015-06-16 DIAGNOSIS — I2510 Atherosclerotic heart disease of native coronary artery without angina pectoris: Secondary | ICD-10-CM | POA: Diagnosis not present

## 2015-06-16 DIAGNOSIS — Z87891 Personal history of nicotine dependence: Secondary | ICD-10-CM | POA: Insufficient documentation

## 2015-06-16 DIAGNOSIS — I5042 Chronic combined systolic (congestive) and diastolic (congestive) heart failure: Secondary | ICD-10-CM

## 2015-06-16 DIAGNOSIS — N183 Chronic kidney disease, stage 3 unspecified: Secondary | ICD-10-CM

## 2015-06-16 DIAGNOSIS — I10 Essential (primary) hypertension: Secondary | ICD-10-CM | POA: Diagnosis not present

## 2015-06-16 DIAGNOSIS — E669 Obesity, unspecified: Secondary | ICD-10-CM

## 2015-06-16 DIAGNOSIS — I129 Hypertensive chronic kidney disease with stage 1 through stage 4 chronic kidney disease, or unspecified chronic kidney disease: Secondary | ICD-10-CM | POA: Insufficient documentation

## 2015-06-16 DIAGNOSIS — Z720 Tobacco use: Secondary | ICD-10-CM

## 2015-06-16 LAB — BASIC METABOLIC PANEL WITH GFR
BUN: 16 mg/dL (ref 7–25)
CALCIUM: 9.7 mg/dL (ref 8.6–10.3)
CO2: 27 mmol/L (ref 20–31)
Chloride: 103 mmol/L (ref 98–110)
Creat: 1.88 mg/dL — ABNORMAL HIGH (ref 0.70–1.33)
GFR, EST AFRICAN AMERICAN: 47 mL/min — AB (ref >=60)
GFR, EST NON AFRICAN AMERICAN: 41 mL/min — AB (ref >=60)
GLUCOSE: 139 mg/dL — AB (ref 65–99)
POTASSIUM: 4.2 mmol/L (ref 3.5–5.3)
SODIUM: 141 mmol/L (ref 135–146)

## 2015-06-16 MED ORDER — POTASSIUM CHLORIDE CRYS ER 20 MEQ PO TBCR: 40.0000 meq | EXTENDED_RELEASE_TABLET | Freq: Every day | ORAL | Status: DC

## 2015-06-16 MED ORDER — AMLODIPINE BESYLATE 10 MG PO TABS: 10.0000 mg | ORAL_TABLET | Freq: Every day | ORAL | Status: DC

## 2015-06-16 MED ORDER — VITAMIN D 50 MCG (2000 UT) PO TABS: 2000.0000 [IU] | ORAL_TABLET | Freq: Every day | ORAL | Status: DC

## 2015-06-16 MED ORDER — ATORVASTATIN CALCIUM 80 MG PO TABS: 80.0000 mg | ORAL_TABLET | Freq: Every day | ORAL | Status: DC

## 2015-06-16 MED ORDER — METOPROLOL TARTRATE 25 MG PO TABS: ORAL_TABLET | ORAL | Status: DC

## 2015-06-16 MED ORDER — NITROGLYCERIN 0.4 MG SL SUBL: 0.4000 mg | SUBLINGUAL_TABLET | SUBLINGUAL | Status: DC | PRN

## 2015-06-16 MED ORDER — CLOPIDOGREL BISULFATE 75 MG PO TABS: 75.0000 mg | ORAL_TABLET | Freq: Every day | ORAL | Status: DC

## 2015-06-16 MED ORDER — FUROSEMIDE 40 MG PO TABS: 40.0000 mg | ORAL_TABLET | Freq: Every day | ORAL | Status: DC

## 2015-06-16 MED ORDER — ASPIRIN EC 81 MG PO TBEC: 81.0000 mg | DELAYED_RELEASE_TABLET | Freq: Every day | ORAL | Status: DC

## 2015-06-16 NOTE — Assessment & Plan Note (Signed)
BP at goal. Continue current regimen Checking Cr/GFR If GFR > 30  Plan to add losartan to replace norvasc Considering adding aldactone as well to help with fluid retention

## 2015-06-16 NOTE — Assessment & Plan Note (Signed)
Obesity   Plan  STOP sugar sweetened drinks Continue daily exercise

## 2015-06-16 NOTE — Progress Notes (Signed)
F/U HTN medicine refills  Taking medication as prescribed  Pain scale # 6 back pain and chest  Stated feet swelling sometime  Former smoker  Suicidal thought in the past two weeks

## 2015-06-16 NOTE — Patient Instructions (Addendum)
Seth Keller was seen today for hypertension.  Diagnoses and all orders for this visit:  Essential hypertension -     BASIC METABOLIC PANEL WITH GFR -     amLODipine (NORVASC) 10 MG tablet; Take 1 tablet (10 mg total) by mouth daily. Pt needs OV for future refills -     aspirin EC 81 MG tablet; Take 1 tablet (81 mg total) by mouth daily. -     clopidogrel (PLAVIX) 75 MG tablet; Take 1 tablet (75 mg total) by mouth daily. -     metoprolol tartrate (LOPRESSOR) 25 MG tablet; TAKE 1 TABLET BY MOUTH 2 TIMES DAILY. -     nitroGLYCERIN (NITROSTAT) 0.4 MG SL tablet; Place 1 tablet (0.4 mg total) under the tongue every 5 (five) minutes as needed for chest pain.  CKD (chronic kidney disease), stage III -     BASIC METABOLIC PANEL WITH GFR -     aspirin EC 81 MG tablet; Take 1 tablet (81 mg total) by mouth daily. -     clopidogrel (PLAVIX) 75 MG tablet; Take 1 tablet (75 mg total) by mouth daily. -     furosemide (LASIX) 40 MG tablet; Take 1 tablet (40 mg total) by mouth daily. -     potassium chloride SA (KLOR-CON M20) 20 MEQ tablet; Take 2 tablets (40 mEq total) by mouth daily. -     nitroGLYCERIN (NITROSTAT) 0.4 MG SL tablet; Place 1 tablet (0.4 mg total) under the tongue every 5 (five) minutes as needed for chest pain.  Chronic combined systolic and diastolic CHF (congestive heart failure) (HCC) -     BASIC METABOLIC PANEL WITH GFR -     furosemide (LASIX) 40 MG tablet; Take 1 tablet (40 mg total) by mouth daily. -     metoprolol tartrate (LOPRESSOR) 25 MG tablet; TAKE 1 TABLET BY MOUTH 2 TIMES DAILY. -     potassium chloride SA (KLOR-CON M20) 20 MEQ tablet; Take 2 tablets (40 mEq total) by mouth daily.  Coronary artery disease involving native coronary artery of native heart without angina pectoris -     aspirin EC 81 MG tablet; Take 1 tablet (81 mg total) by mouth daily. -     clopidogrel (PLAVIX) 75 MG tablet; Take 1 tablet (75 mg total) by mouth daily. -     atorvastatin (LIPITOR) 80 MG tablet;  Take 1 tablet (80 mg total) by mouth daily. -     metoprolol tartrate (LOPRESSOR) 25 MG tablet; TAKE 1 TABLET BY MOUTH 2 TIMES DAILY. -     nitroGLYCERIN (NITROSTAT) 0.4 MG SL tablet; Place 1 tablet (0.4 mg total) under the tongue every 5 (five) minutes as needed for chest pain.  Ischemic cardiomyopathy -     aspirin EC 81 MG tablet; Take 1 tablet (81 mg total) by mouth daily. -     clopidogrel (PLAVIX) 75 MG tablet; Take 1 tablet (75 mg total) by mouth daily. -     nitroGLYCERIN (NITROSTAT) 0.4 MG SL tablet; Place 1 tablet (0.4 mg total) under the tongue every 5 (five) minutes as needed for chest pain.  Tobacco abuse -     aspirin EC 81 MG tablet; Take 1 tablet (81 mg total) by mouth daily. -     clopidogrel (PLAVIX) 75 MG tablet; Take 1 tablet (75 mg total) by mouth daily. -     nitroGLYCERIN (NITROSTAT) 0.4 MG SL tablet; Place 1 tablet (0.4 mg total) under the tongue every 5 (five) minutes as  needed for chest pain.  Vitamin D insufficiency -     Cholecalciferol (VITAMIN D) 2000 units tablet; Take 1 tablet (2,000 Units total) by mouth daily.    1. STOP all sugar sweetened drinks to help with weight loss. 2. Checking labs. Based on labs may adjust medications with either adding losartan to replace norvasc or adding aldactone and stopping potassium  F/u in 3 months  Dr. Adrian Blackwater

## 2015-06-16 NOTE — Progress Notes (Signed)
Depression screen [facility] 2/9 06/16/2015 09/27/2014 08/16/2014 01/11/2014  Decreased Interest 2 0 0 0  Down, Depressed, Hopeless 1 1 0 0  PHQ - 2 Score 3 1 0 0  Altered sleeping 0 - - -  Tired, decreased energy 2 - - -  Change in appetite 1 - - -  Feeling bad or failure about yourself  0 - - -  Trouble concentrating 0 - - -  Moving slowly or fidgety/restless 0 - - -  Suicidal thoughts 0 - - -  PHQ-9 Score 6 - - -    GAD 7 : Generalized Anxiety Score 06/16/2015  Nervous, Anxious, on Edge 0  Control/stop worrying 0  Worry too much - different things 0  Trouble relaxing 0  Restless 0  Easily annoyed or irritable 0  Afraid - awful might happen 0  Total GAD 7 Score 0

## 2015-06-16 NOTE — Progress Notes (Signed)
Subjective:  Patient ID: Seth Keller, male    DOB: 1965-03-21  Age: 50 y.o. MRN: [id number]  CC: Hypertension   HPI Corbett Warga has CAD, hx of MI s/p NSTEMI and CABG, CHF, CKD   1. CHF: patient compliant with increased lasix dose of 40 mg daily with potassium. He admit to SOB with exercise which he reports is improving overall.  He has minimal leg swelling. No dizziness or lightheadedness. No smoker.   2. HTN: compliant with medication regimen. Has quit smoking. No ETOH. Leg swelling.    4. Apply for disability: still awaiting appeal. Remains unemployed.  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 Still unable to perform the same work due to weakness, CP and SOB following NSTEMI and CABG on 01/01/2014.   5. CKD: established with nephrology,  Dr. Gale Journey. Reports kidney function is improving.   6. Weight gain: compliant with lasix. Admits to drinking juice and soda. Unemployed. Walks daily.   7. HM: prefers to wait 6 months for colonoscopy.   Social History  Substance Use Topics  . Smoking status: Former Smoker -- 0.50 packs/day for 20 years    Types: Cigarettes    Quit date: 12/24/2013  . Smokeless tobacco: Never Used  . Alcohol Use: No     Comment: socially, 1-2 x year   Outpatient Prescriptions Prior to Visit  Medication Sig Dispense Refill  . amLODipine (NORVASC) 10 MG tablet Take 1 tablet (10 mg total) by mouth daily. Pt needs OV for future refills 30 tablet 0  . aspirin EC 81 MG tablet Take 1 tablet (81 mg total) by mouth daily. 90 tablet 3  . atorvastatin (LIPITOR) 80 MG tablet Take 1 tablet (80 mg total) by mouth daily. 90 tablet 3  . clopidogrel (PLAVIX) 75 MG tablet Take 1 tablet (75 mg total) by mouth daily. 30 tablet 1  . furosemide (LASIX) 40 MG tablet TAKE ONE TABLET BY MOUTH ONCE DAILY 30 tablet 1  . KLOR-CON M20 20 MEQ tablet TAKE TWO TABLETS BY MOUTH ONCE DAILY 60 tablet 1  . metoprolol tartrate (LOPRESSOR) 25 MG tablet TAKE 1 TABLET BY  MOUTH 2 TIMES DAILY. 60 tablet 5  . nitroGLYCERIN (NITROSTAT) 0.4 MG SL tablet Place 1 tablet (0.4 mg total) under the tongue every 5 (five) minutes as needed for chest pain. 90 tablet 3  . clopidogrel (PLAVIX) 75 MG tablet TAKE 1 TABLET BY MOUTH DAILY. 90 tablet 2  . docusate sodium (COLACE) 100 MG capsule Take 1 capsule (100 mg total) by mouth 2 (two) times daily. (Patient not taking: Reported on 06/16/2015) 30 capsule 0   No facility-administered medications prior to visit.    ROS Review of Systems  Constitutional: Negative for fever, chills, fatigue and unexpected weight change.  Eyes: Negative for visual disturbance.  Respiratory: Negative for cough and shortness of breath.   Cardiovascular: Positive for chest pain and leg swelling. Negative for palpitations.  Gastrointestinal: Negative for nausea, vomiting, abdominal pain, diarrhea, constipation and blood in stool.  Endocrine: Negative for polydipsia, polyphagia and polyuria.  Musculoskeletal: Positive for back pain (low back since 2015). Negative for myalgias, arthralgias, gait problem and neck pain.  Skin: Negative for rash.  Allergic/Immunologic: Negative for immunocompromised state.  Hematological: Negative for adenopathy. Does not bruise/bleed easily.  Psychiatric/Behavioral: Negative for suicidal ideas, sleep disturbance and dysphoric mood. The patient is not nervous/anxious.     Objective:  BP 135/86 mmHg  Pulse 88  Temp(Src) 98.4 F (36.9 C) (  Oral)  Resp 16  Ht 5\' 9"  (1.753 m)  Wt 256 lb (116.121 kg)  BMI 37.79 kg/m2  SpO2 98%  BP/Weight 06/16/2015 A999333 A999333  Systolic BP A999333 [id number]  Diastolic BP 86 80 84  Wt. (Lbs) 256 254 245  BMI 37.79 36.45 35.15    Physical Exam  Constitutional: He appears well-developed and well-nourished. No distress.  HENT:  Head: Normocephalic and atraumatic.  Neck: Normal range of motion. Neck supple.  Cardiovascular: Normal rate, regular rhythm, normal heart sounds and  intact distal pulses.   Pulmonary/Chest: Effort normal and breath sounds normal.    Musculoskeletal: He exhibits edema (trace in b/l LE ).  Neurological: He is alert.  Skin: Skin is warm and dry. No rash noted. No erythema.  Psychiatric: He has a normal mood and affect.   Assessment & Plan:   There are no diagnoses linked to this encounter. Morrell was seen today for hypertension.  Diagnoses and all orders for this visit:  Essential hypertension -     BASIC METABOLIC PANEL WITH GFR -     amLODipine (NORVASC) 10 MG tablet; Take 1 tablet (10 mg total) by mouth daily. Pt needs OV for future refills -     aspirin EC 81 MG tablet; Take 1 tablet (81 mg total) by mouth daily. -     clopidogrel (PLAVIX) 75 MG tablet; Take 1 tablet (75 mg total) by mouth daily. -     metoprolol tartrate (LOPRESSOR) 25 MG tablet; TAKE 1 TABLET BY MOUTH 2 TIMES DAILY. -     nitroGLYCERIN (NITROSTAT) 0.4 MG SL tablet; Place 1 tablet (0.4 mg total) under the tongue every 5 (five) minutes as needed for chest pain.  CKD (chronic kidney disease), stage III -     BASIC METABOLIC PANEL WITH GFR -     aspirin EC 81 MG tablet; Take 1 tablet (81 mg total) by mouth daily. -     clopidogrel (PLAVIX) 75 MG tablet; Take 1 tablet (75 mg total) by mouth daily. -     furosemide (LASIX) 40 MG tablet; Take 1 tablet (40 mg total) by mouth daily. -     potassium chloride SA (KLOR-CON M20) 20 MEQ tablet; Take 2 tablets (40 mEq total) by mouth daily. -     nitroGLYCERIN (NITROSTAT) 0.4 MG SL tablet; Place 1 tablet (0.4 mg total) under the tongue every 5 (five) minutes as needed for chest pain.  Chronic combined systolic and diastolic CHF (congestive heart failure) (HCC) -     BASIC METABOLIC PANEL WITH GFR -     furosemide (LASIX) 40 MG tablet; Take 1 tablet (40 mg total) by mouth daily. -     metoprolol tartrate (LOPRESSOR) 25 MG tablet; TAKE 1 TABLET BY MOUTH 2 TIMES DAILY. -     potassium chloride SA (KLOR-CON M20) 20 MEQ tablet;  Take 2 tablets (40 mEq total) by mouth daily.  Coronary artery disease involving native coronary artery of native heart without angina pectoris -     aspirin EC 81 MG tablet; Take 1 tablet (81 mg total) by mouth daily. -     clopidogrel (PLAVIX) 75 MG tablet; Take 1 tablet (75 mg total) by mouth daily. -     atorvastatin (LIPITOR) 80 MG tablet; Take 1 tablet (80 mg total) by mouth daily. -     metoprolol tartrate (LOPRESSOR) 25 MG tablet; TAKE 1 TABLET BY MOUTH 2 TIMES DAILY. -     nitroGLYCERIN (NITROSTAT) 0.4 MG  SL tablet; Place 1 tablet (0.4 mg total) under the tongue every 5 (five) minutes as needed for chest pain.  Ischemic cardiomyopathy -     aspirin EC 81 MG tablet; Take 1 tablet (81 mg total) by mouth daily. -     clopidogrel (PLAVIX) 75 MG tablet; Take 1 tablet (75 mg total) by mouth daily. -     nitroGLYCERIN (NITROSTAT) 0.4 MG SL tablet; Place 1 tablet (0.4 mg total) under the tongue every 5 (five) minutes as needed for chest pain.  Tobacco abuse -     aspirin EC 81 MG tablet; Take 1 tablet (81 mg total) by mouth daily. -     clopidogrel (PLAVIX) 75 MG tablet; Take 1 tablet (75 mg total) by mouth daily. -     nitroGLYCERIN (NITROSTAT) 0.4 MG SL tablet; Place 1 tablet (0.4 mg total) under the tongue every 5 (five) minutes as needed for chest pain.  Vitamin D insufficiency -     Cholecalciferol (VITAMIN D) 2000 units tablet; Take 1 tablet (2,000 Units total) by mouth daily.   No orders of the defined types were placed in this encounter.    Follow-up: No Follow-up on file.   Boykin Nearing MD

## 2015-06-22 ENCOUNTER — Telehealth: Payer: Self-pay | Admitting: Family Medicine

## 2015-06-22 DIAGNOSIS — N183 Chronic kidney disease, stage 3 unspecified: Secondary | ICD-10-CM

## 2015-06-22 DIAGNOSIS — I255 Ischemic cardiomyopathy: Secondary | ICD-10-CM

## 2015-06-22 DIAGNOSIS — I2510 Atherosclerotic heart disease of native coronary artery without angina pectoris: Secondary | ICD-10-CM

## 2015-06-22 DIAGNOSIS — Z720 Tobacco use: Secondary | ICD-10-CM

## 2015-06-22 DIAGNOSIS — I10 Essential (primary) hypertension: Secondary | ICD-10-CM

## 2015-06-22 NOTE — Telephone Encounter (Signed)
Patient is following up regarding lab results from office visit.Marland KitchenMarland Kitchen

## 2015-06-24 MED ORDER — CLOPIDOGREL BISULFATE 75 MG PO TABS: 75.0000 mg | ORAL_TABLET | Freq: Every day | ORAL | Status: DC

## 2015-06-24 MED ORDER — AMLODIPINE BESYLATE 10 MG PO TABS: 10.0000 mg | ORAL_TABLET | Freq: Every day | ORAL | Status: DC

## 2015-06-24 NOTE — Telephone Encounter (Signed)
Called patient Verified name Gave lab results  Gave option to add low dose losartan 25 mg, 1/2 norvasc to 5 mg and carefully monitor renal function and BP with goal of improving foot and ankle swelling and getting decreased cardiac mortality and possibly improved GFR overtime.   Vs  Keeping all meds the same.  Patient prefers to stay the course. He has increased his exercise and changed his diet to help with recent weight gain.  Request 90 day supply of all meds  Request help with disability application. I offered to write a letter to help support his case.  Patient prefers the letter to be mailed.

## 2015-09-05 ENCOUNTER — Telehealth: Payer: Self-pay | Admitting: Family Medicine

## 2015-09-05 NOTE — Telephone Encounter (Signed)
Please inform patient that Seth Keller long term disability form received and completed.  Dr. Adrian Blackwater has indicated that return to work with restrictions, sedentary work, is possible.   The form along with patient records will be faxed back to [place].

## 2015-09-06 NOTE — Telephone Encounter (Signed)
Patient called requesting to f/u about return to work note, pt states he has had a quadruple bypass  and is no longer able to perform job duties. Pt would like PCP to f/u

## 2015-09-07 ENCOUNTER — Telehealth: Payer: Self-pay | Admitting: Family Medicine

## 2015-09-07 NOTE — Telephone Encounter (Signed)
Patient would like a phone call from doctor. Patient stated that the doctor was supposed to write a letter to [facility] stating that he was able to work. Patient would like to speak with the doctor before she writes

## 2015-09-07 NOTE — Telephone Encounter (Signed)
Please inform patient that I am aware that he cannot perform his previous job the way he used to, carrying, climbing etc. Due to his hx of bypass surgery and congestive heart failure. I have  indicated this fact in his disability paperwork.

## 2015-09-08 NOTE — Telephone Encounter (Signed)
Pt. Came into requesting to speak with his PCP b/c he states he is unable to work at all.  Pt. Would like to speak with his PCP and let her know why he is unable to work. Please f/u with pt.

## 2015-09-09 NOTE — Telephone Encounter (Signed)
Noted  

## 2015-09-09 NOTE — Telephone Encounter (Signed)
Pt states [employer] sent Dr. Adrian Blackwater paperwork on why he couldn't go back to work. Pt wanted to make sure Dr. 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 Pt states when he sits for a long period of time his legs starts to swell up. Pt stated he just wanted to make sure Dr. Adrian Blackwater was filling paperwork out correctly.

## 2015-09-26 IMAGING — CR DG CHEST 2V
2 series · 2 of 2 positions shown · non-contrast
Comparison: 12/26/2013.

CLINICAL DATA: Pre-op for CABG. No current complaints. Initial
encounter.

EXAM:
CHEST  2 VIEW

[chest pa]
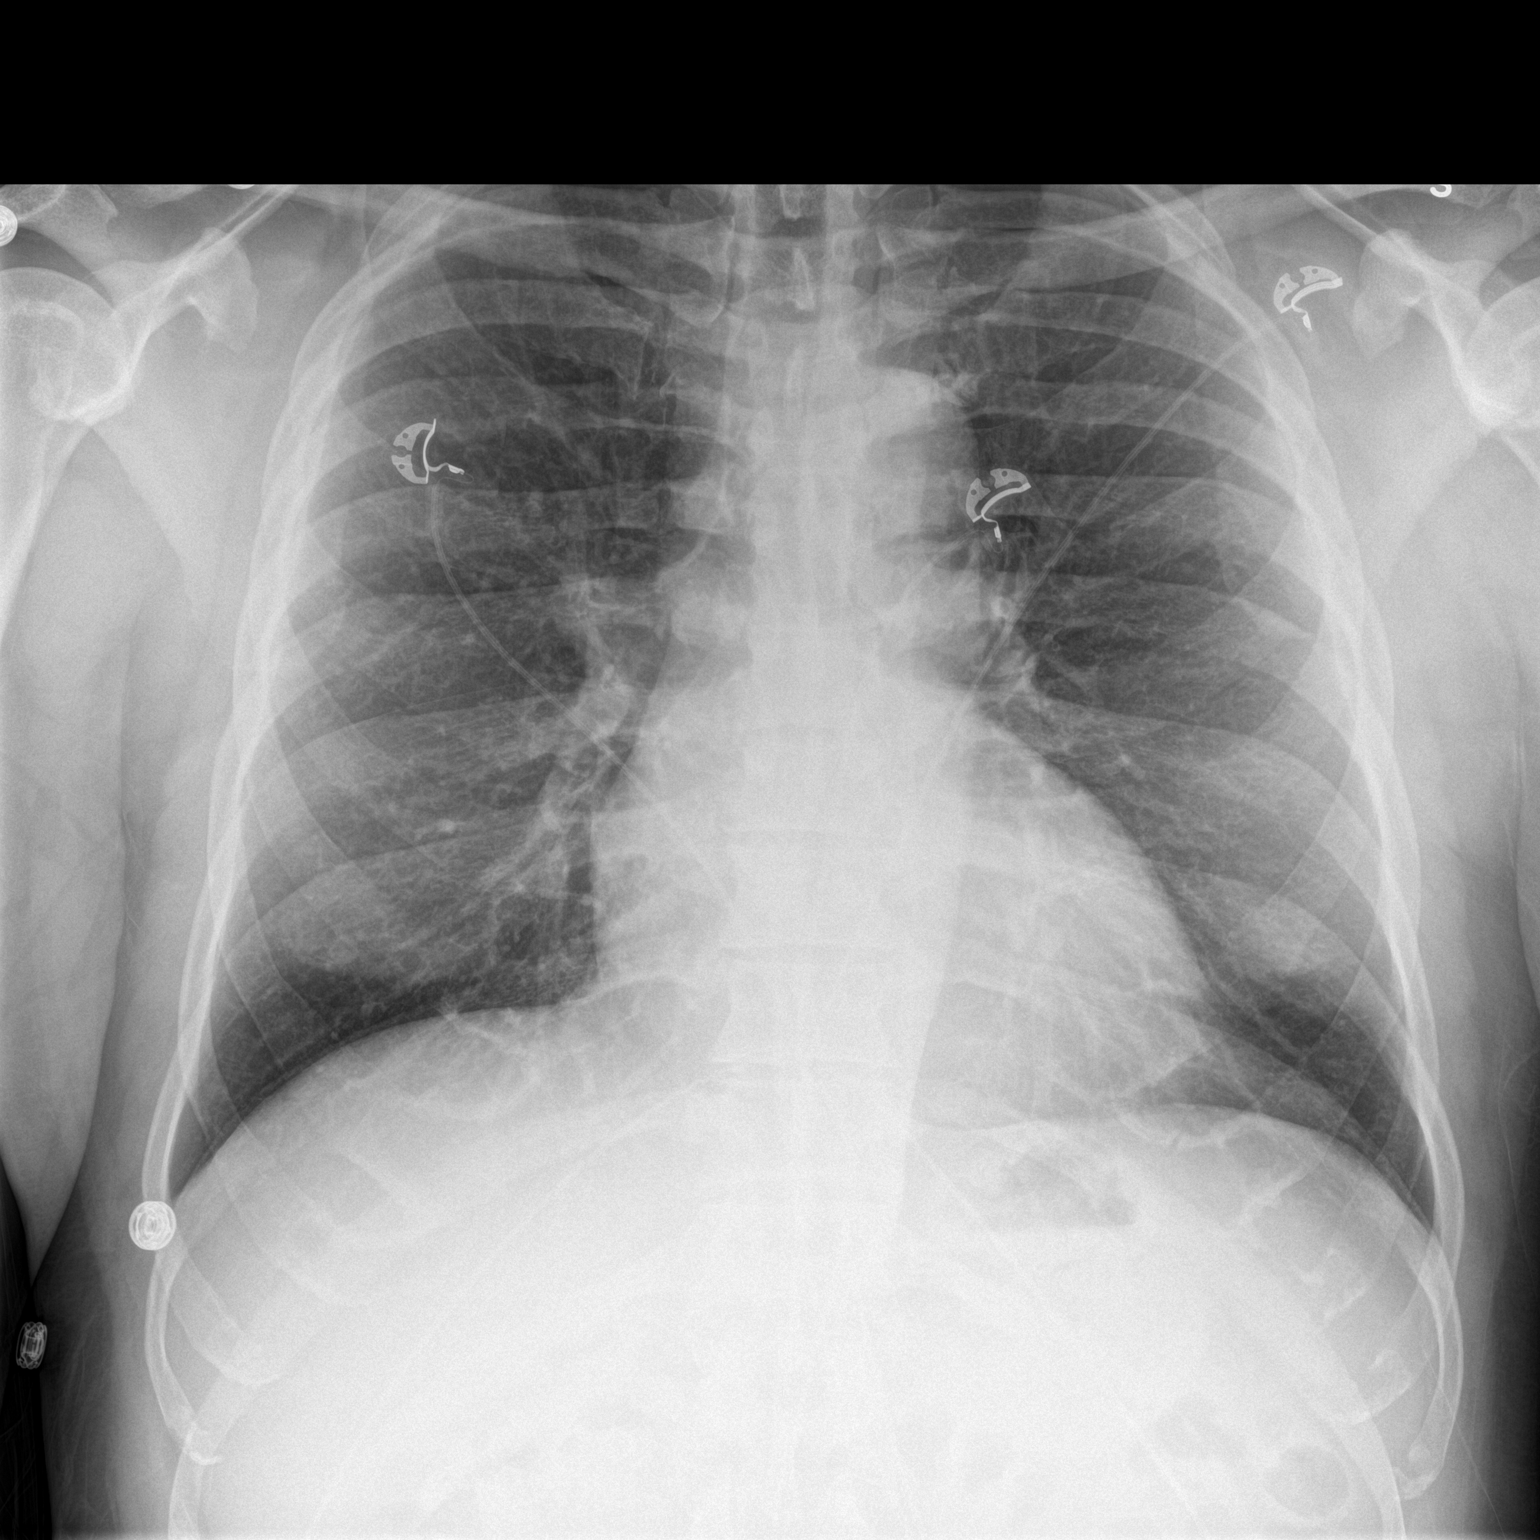

[chest lat]
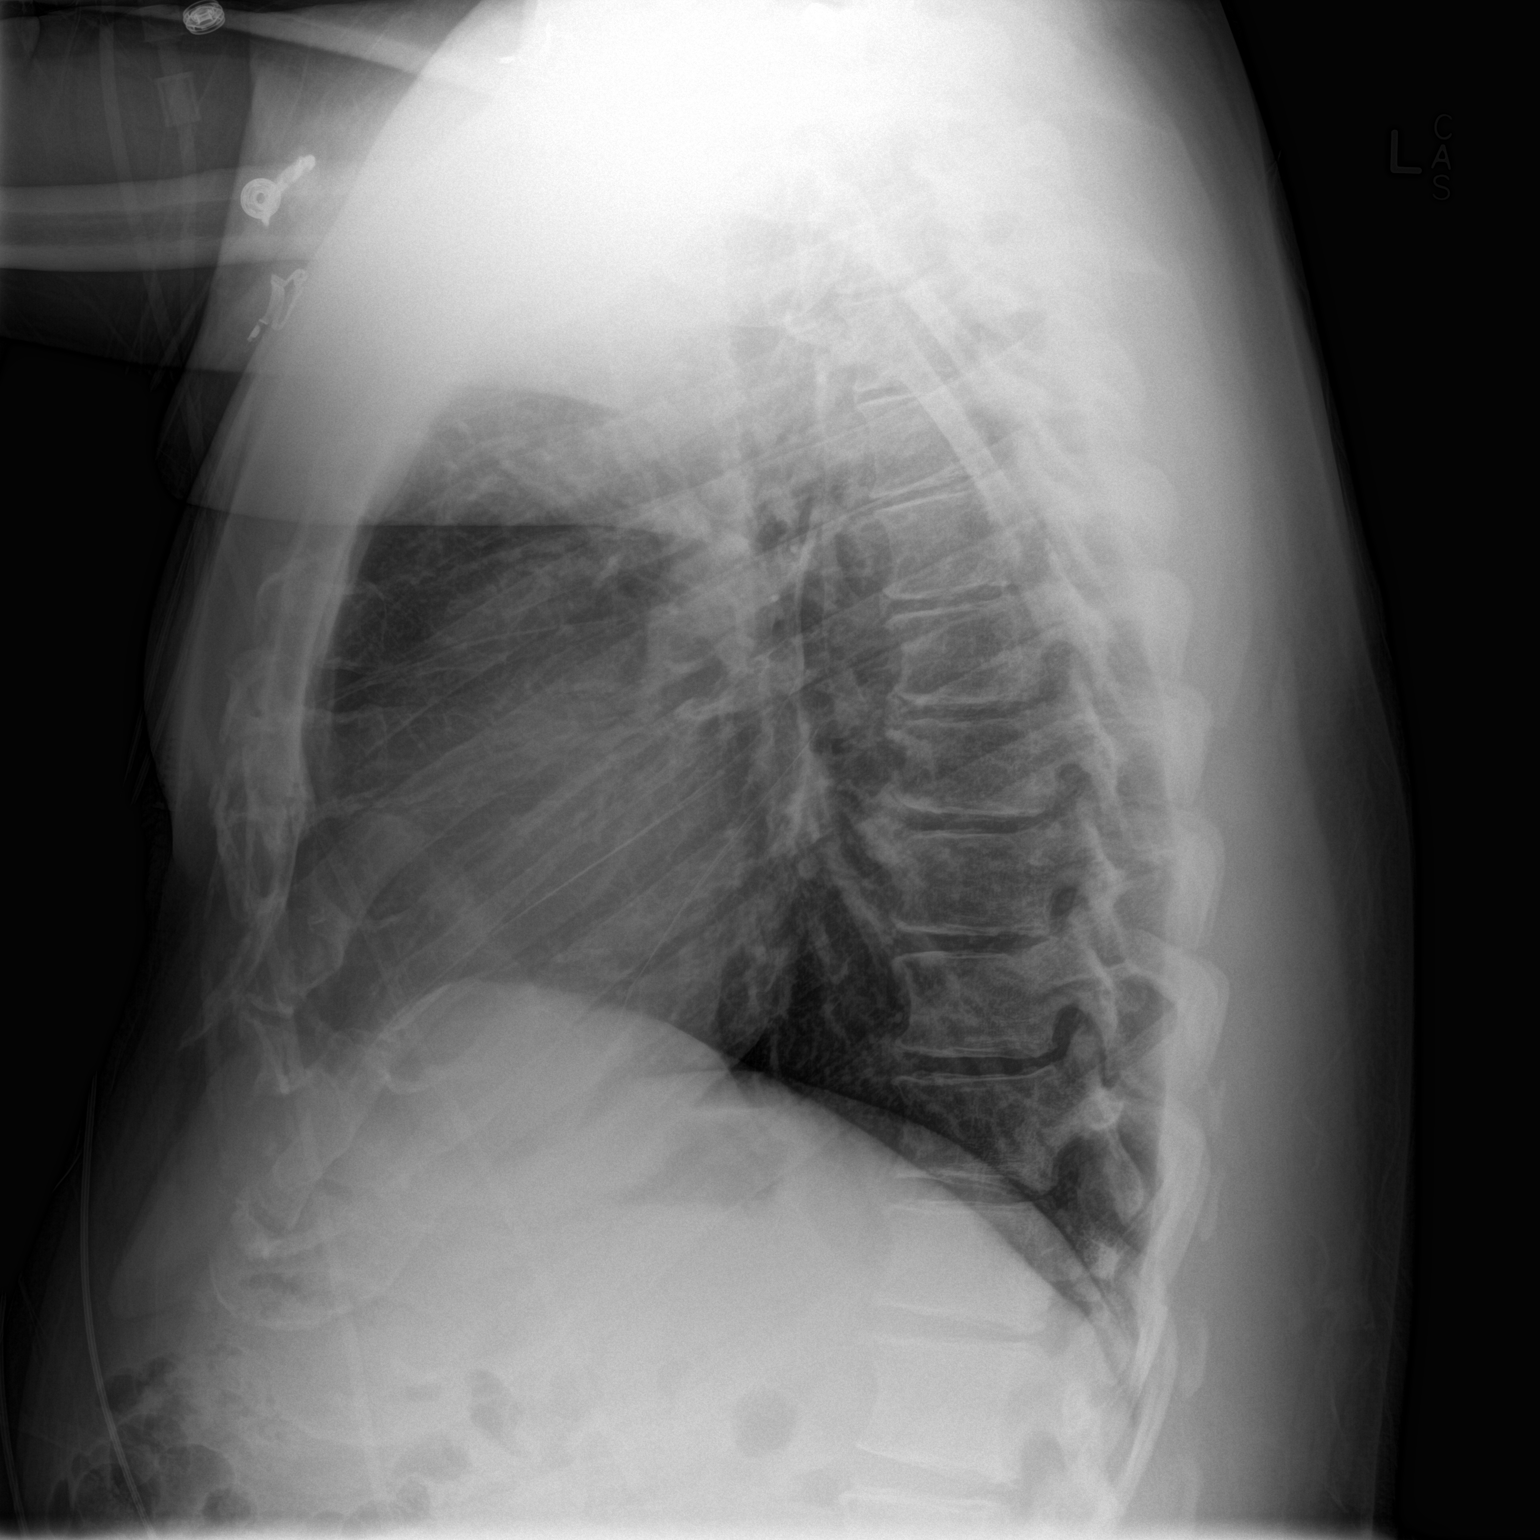

[2 of 2 positions shown; findings below may reference images not displayed]

FINDINGS: The heart size and mediastinal contours are normal. The lungs are
clear. There is no pleural effusion or pneumothorax. No acute
osseous findings are identified.
IMPRESSION: No active cardiopulmonary process.

## 2015-09-27 IMAGING — CR DG CHEST 1V PORT
1 series · 1 of 1 positions shown · non-contrast
Comparison: Portable exam 4575 hr compared to 12/31/2013

CLINICAL DATA: Post 4 vessel CABG

EXAM:
PORTABLE CHEST - 1 VIEW

[AP]
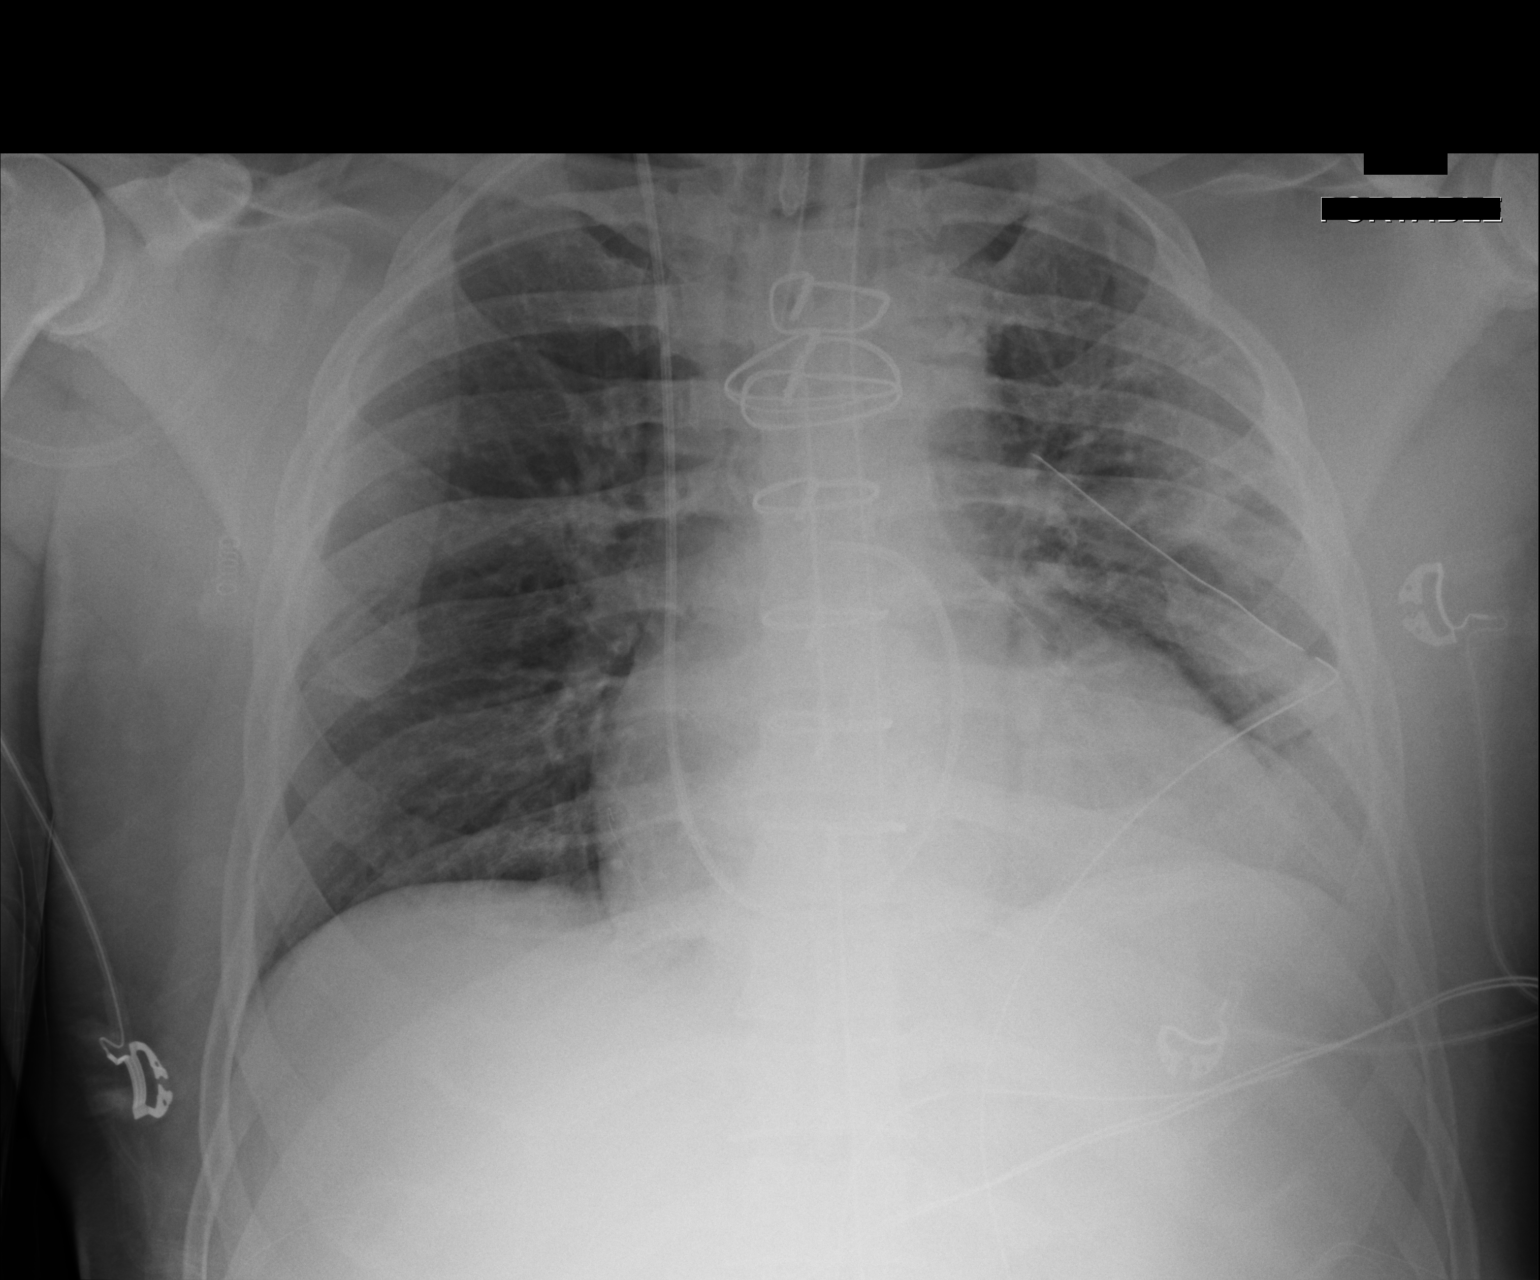

[1 of 1 positions shown; findings below may reference images not displayed]

FINDINGS: Tip of endotracheal tube projects 4.6 cm above carina.

Mediastinal drain and LEFT thoracostomy tube present.

RIGHT jugular Swan-Ganz catheter tip projects over proximal RIGHT
pulmonary artery.

An additional tube projects along the LEFT superior in the
mediastinum, uncertain etiology ; this does not appear to contain a
short lucent marker segment is suggests this is a nasogastric tube
though if patient has a nasogastric tube the tip is in the distal
esophagus and requires advancing into the stomach.

Epicardial pacing wires are noted.

Enlargement of cardiac silhouette post interval median sternotomy.

Scattered atelectasis LEFT lung.

No acute infiltrate, pleural effusion or pneumothorax.
IMPRESSION: Scattered LEFT lung atelectasis.

Questionable nasogastric tube projecting over mediastinum ; if
patient does indeed have a nasogastric tube, the tip projects over
the esophagus and the tube requires advancing into stomach.

## 2015-09-28 IMAGING — CR DG CHEST 1V PORT
1 series · 1 of 1 positions shown · non-contrast
Comparison: 01/01/2014

CLINICAL DATA: Status post coronary bypass grafting, chest pain

EXAM:
PORTABLE CHEST - 1 VIEW

[AP]
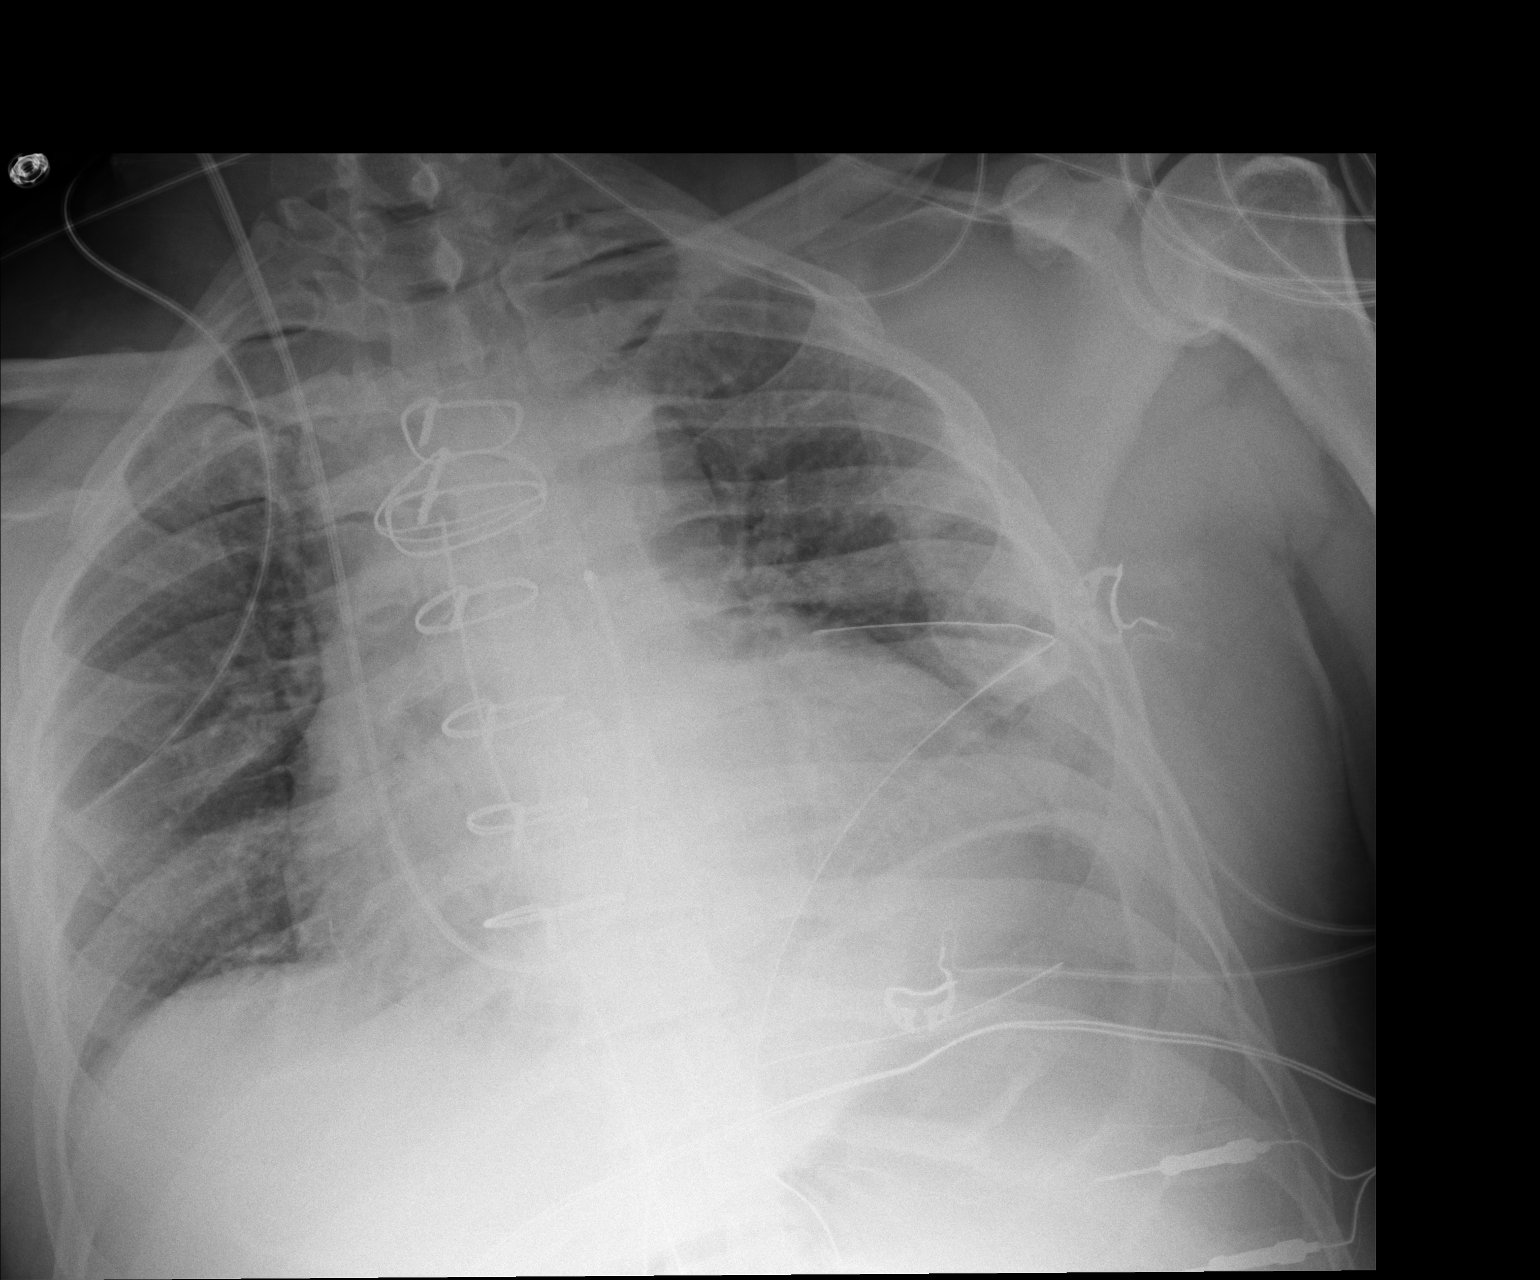

[1 of 1 positions shown; findings below may reference images not displayed]

FINDINGS: Cardiac shadow remains enlarged. Postsurgical changes are noted.
Mediastinal drain, pericardial drain, left thoracostomy catheter and
Swan-Ganz catheter are again noted and stable. The endotracheal tube
and nasogastric catheter have been removed in the interval. The
overall inspiratory effort although poor shows no significant
infiltrate.
IMPRESSION: Tubes and lines as described above.

No focal infiltrate is noted. The overall inspiratory effort is
poor.

## 2015-09-30 IMAGING — CR DG CHEST 2V
2 series · 2 of 2 positions shown · non-contrast
Comparison: Portable chest x-ray January 03, 2014

CLINICAL DATA: Status post CABG ; atelectasis ; history of CHF

EXAM:
CHEST  2 VIEW

[w chest pa]
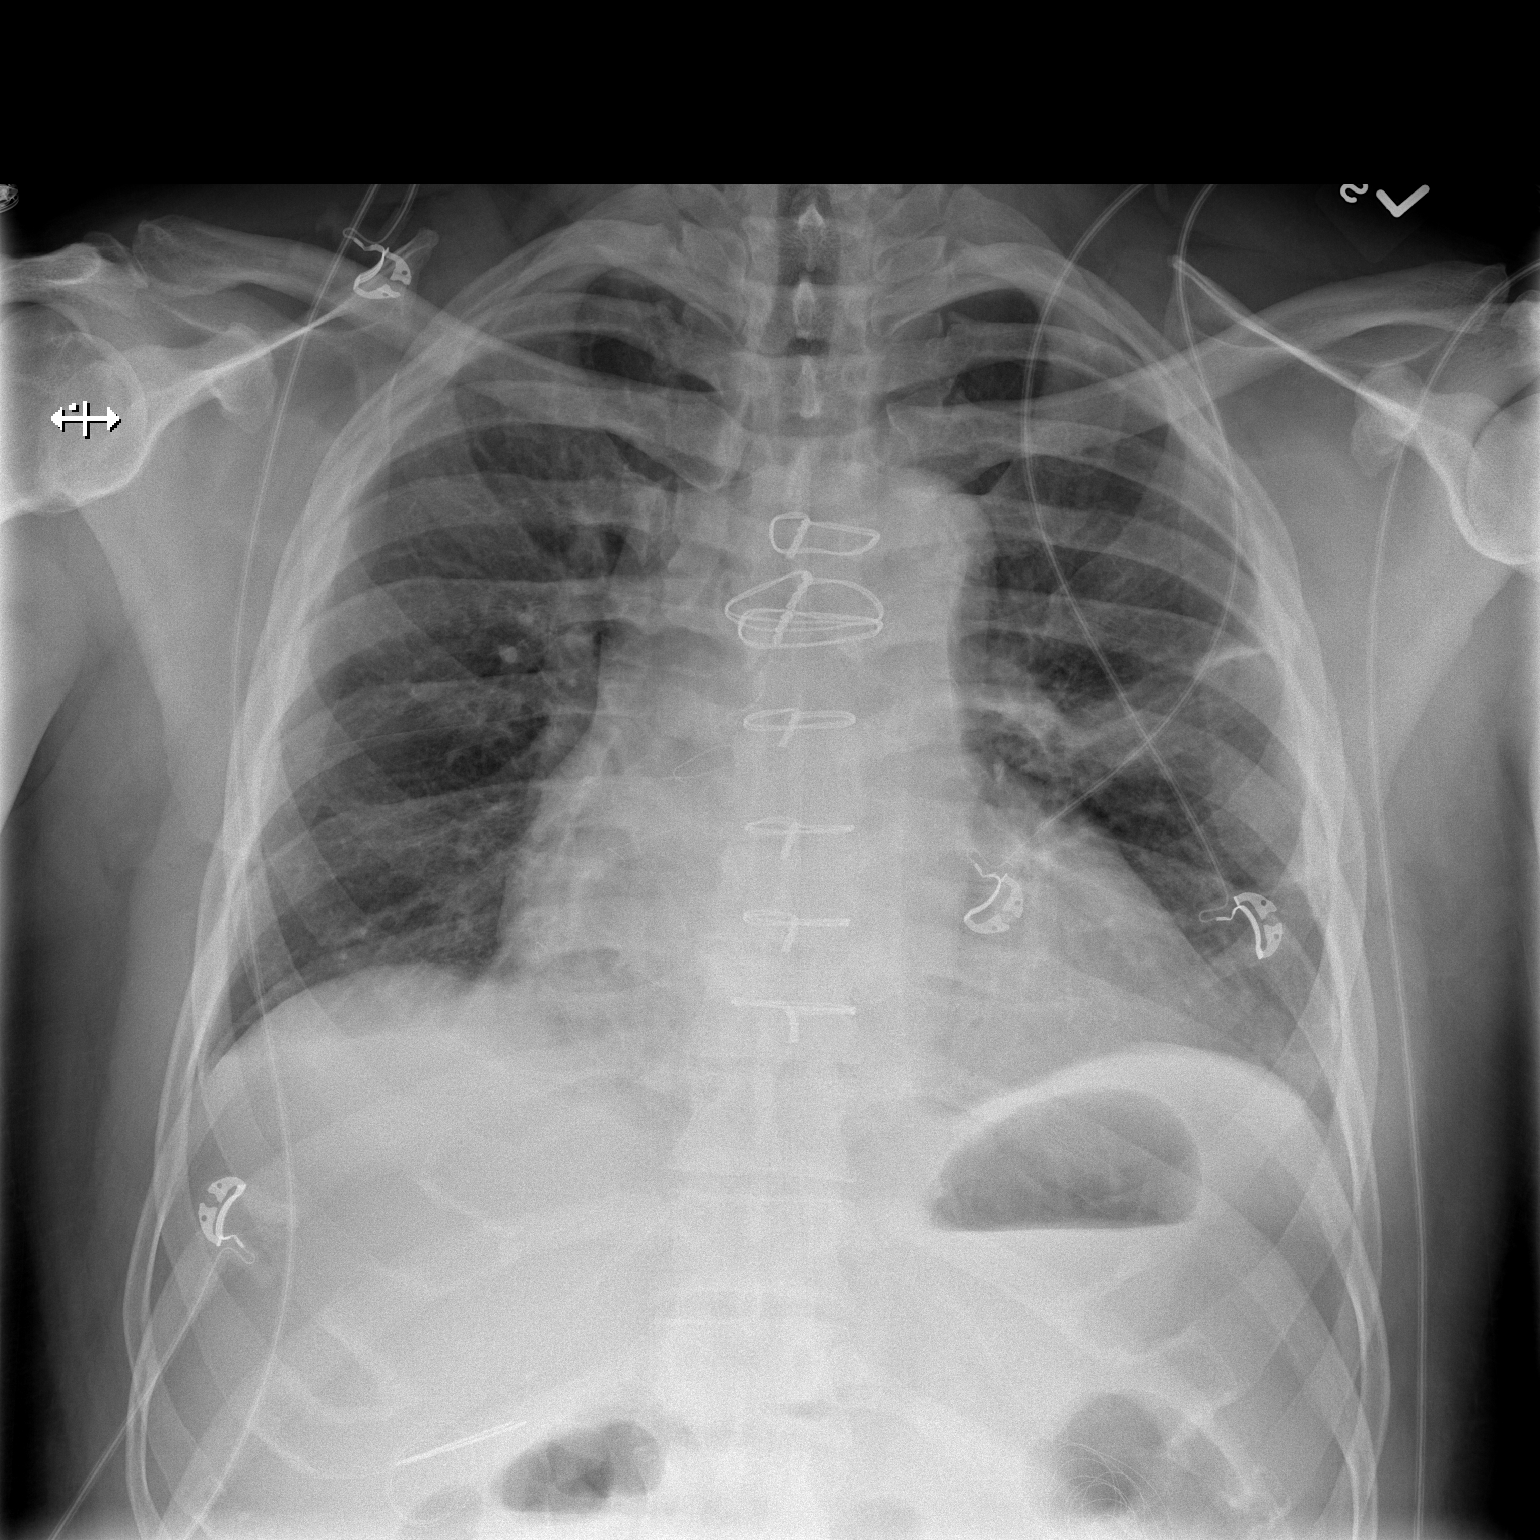

[w chest lat]
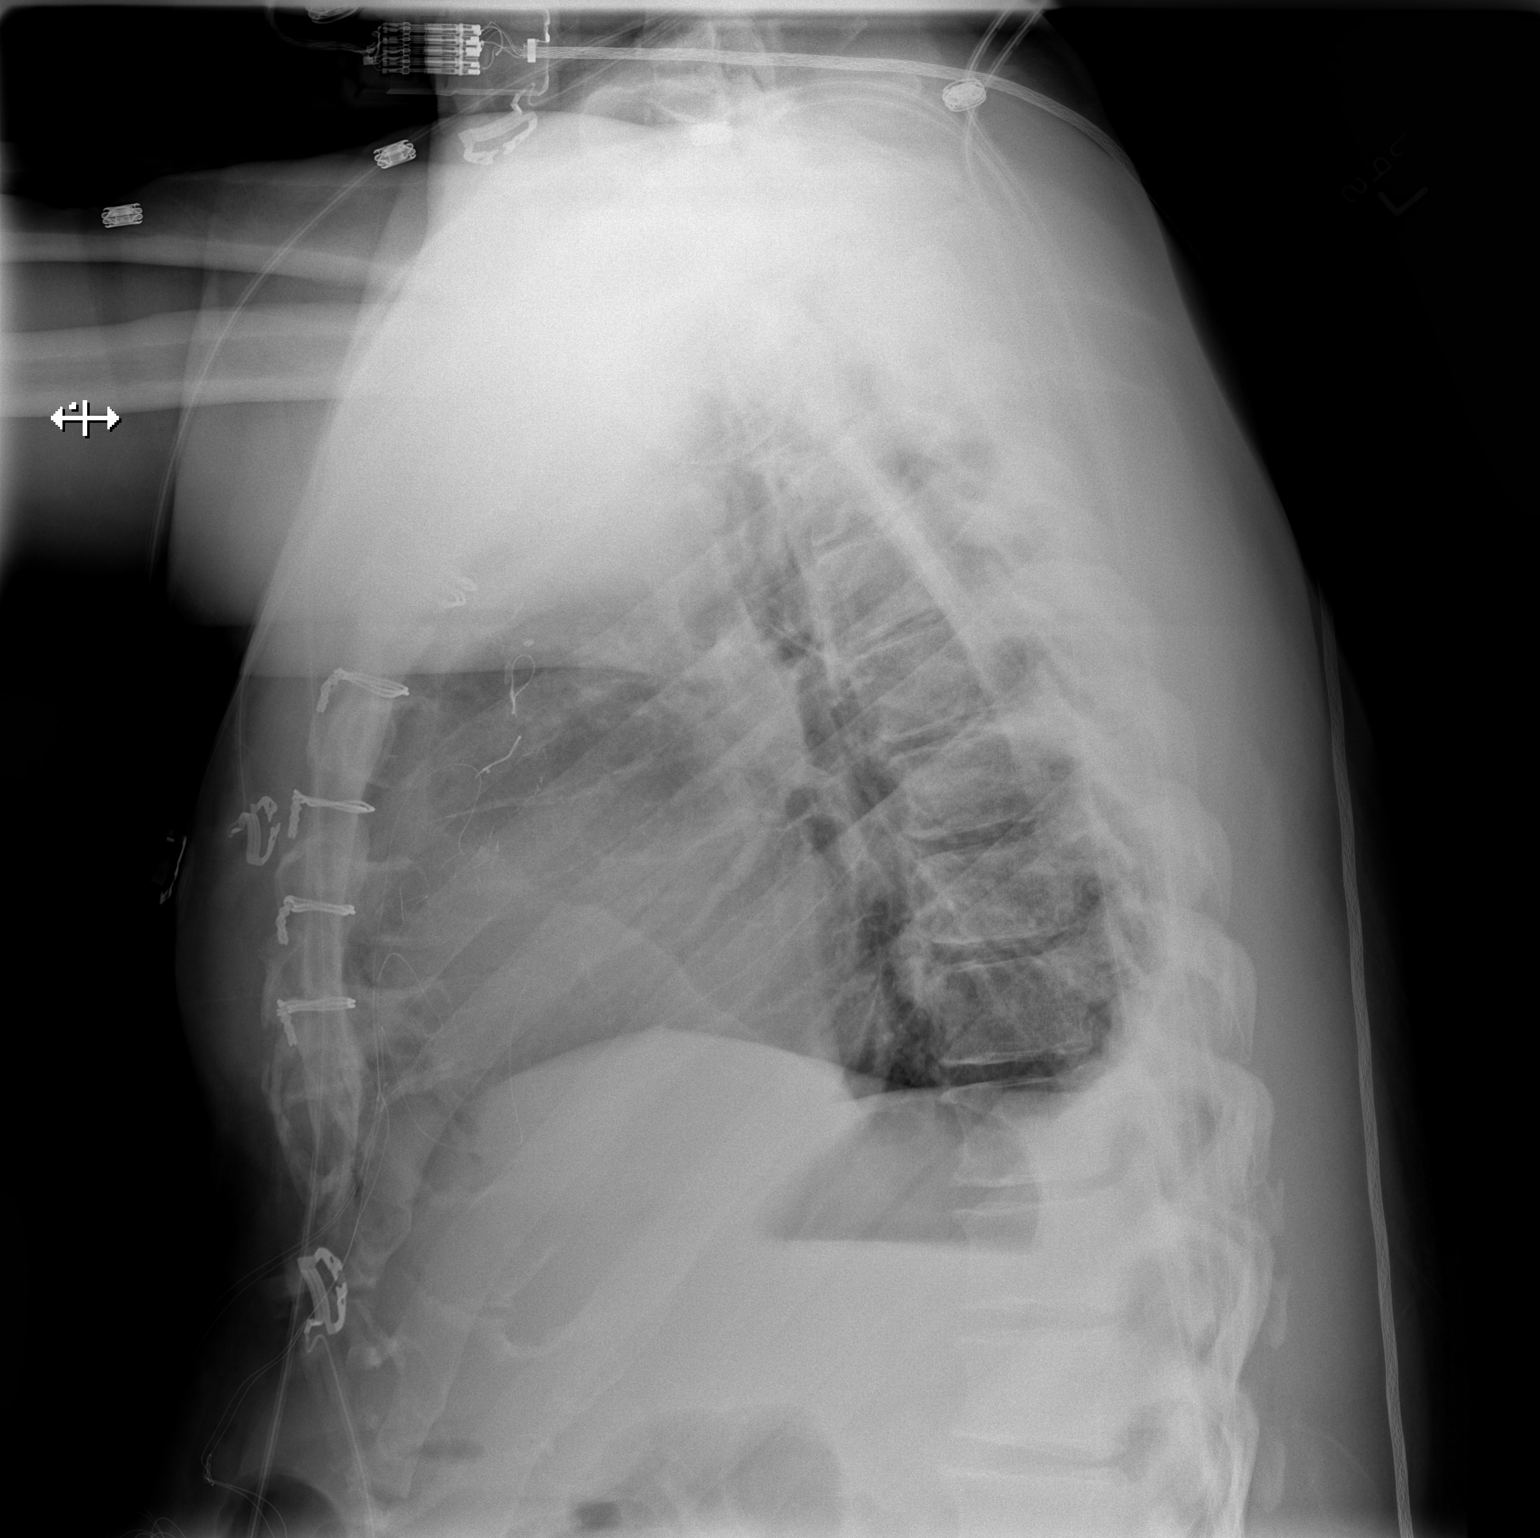

[2 of 2 positions shown; findings below may reference images not displayed]

FINDINGS: The lungs are slightly better inflated today. There is persistent
linear density peripherally in the left mid lung. There is no
alveolar pneumonia. There is are small bilateral pleural effusions
layering posteriorly. The cardiac silhouette is mildly enlarged. The
pulmonary vascularity is not engorged. There is 7 intact sternal
wires present.
IMPRESSION: There are small bilateral pleural effusions. There is subsegmental
atelectasis peripherally in the left mid lung. There is no pulmonary
edema.

## 2015-10-13 ENCOUNTER — Encounter: Payer: Self-pay | Admitting: Family Medicine

## 2015-12-22 ENCOUNTER — Telehealth: Payer: Self-pay | Admitting: Family Medicine

## 2015-12-22 ENCOUNTER — Encounter: Payer: Self-pay | Admitting: Family Medicine

## 2015-12-22 ENCOUNTER — Ambulatory Visit: Payer: Medicaid Other | Attending: Family Medicine | Admitting: Family Medicine

## 2015-12-22 VITALS — BP 151/87 | HR 65 | Temp 98.4°F | Ht 69.0 in | Wt 269.2 lb

## 2015-12-22 DIAGNOSIS — R0602 Shortness of breath: Secondary | ICD-10-CM | POA: Diagnosis not present

## 2015-12-22 DIAGNOSIS — Z7982 Long term (current) use of aspirin: Secondary | ICD-10-CM | POA: Insufficient documentation

## 2015-12-22 DIAGNOSIS — I5042 Chronic combined systolic (congestive) and diastolic (congestive) heart failure: Secondary | ICD-10-CM | POA: Insufficient documentation

## 2015-12-22 DIAGNOSIS — I4891 Unspecified atrial fibrillation: Secondary | ICD-10-CM | POA: Diagnosis not present

## 2015-12-22 DIAGNOSIS — Z87891 Personal history of nicotine dependence: Secondary | ICD-10-CM | POA: Insufficient documentation

## 2015-12-22 DIAGNOSIS — G8929 Other chronic pain: Secondary | ICD-10-CM | POA: Insufficient documentation

## 2015-12-22 DIAGNOSIS — I2510 Atherosclerotic heart disease of native coronary artery without angina pectoris: Secondary | ICD-10-CM | POA: Diagnosis not present

## 2015-12-22 DIAGNOSIS — M545 Low back pain, unspecified: Secondary | ICD-10-CM | POA: Insufficient documentation

## 2015-12-22 DIAGNOSIS — I130 Hypertensive heart and chronic kidney disease with heart failure and stage 1 through stage 4 chronic kidney disease, or unspecified chronic kidney disease: Secondary | ICD-10-CM | POA: Diagnosis not present

## 2015-12-22 DIAGNOSIS — I10 Essential (primary) hypertension: Secondary | ICD-10-CM | POA: Diagnosis not present

## 2015-12-22 DIAGNOSIS — N183 Chronic kidney disease, stage 3 unspecified: Secondary | ICD-10-CM

## 2015-12-22 MED ORDER — ACETAMINOPHEN-CODEINE #4 300-60 MG PO TABS: 1.0000 | ORAL_TABLET | Freq: Two times a day (BID) | ORAL | 0 refills | Status: DC

## 2015-12-22 MED ORDER — FUROSEMIDE 40 MG PO TABS: 40.0000 mg | ORAL_TABLET | Freq: Every day | ORAL | 3 refills | Status: DC

## 2015-12-22 NOTE — Telephone Encounter (Signed)
Patient called the office to find out the status on SCAT. Please follow up.

## 2015-12-22 NOTE — Progress Notes (Signed)
Subjective:  Patient ID: Seth Keller, male    DOB: Jun 21, 1965  Age: 50 y.o. 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 CHF: patient compliant with increased lasix dose of 40 mg daily with potassium. He admit to SOB with exercise. He has some leg swelling. He is not consistently compliant with low sodium diet. No exercise. He last saw his cardiologist in follow up in 10/2014.   No dizziness or lightheadedness. No smoker.   2. HTN: compliant with medication regimen. Has quit smoking. No ETOH. Leg swelling.   3. Apply for disability:  He is upset that he disability was denied. He applied on the basis of his CAD and CHF and ongoing CP, SOB and leg swelling. Today he reports ongoing CP, SOB, leg swelling. He also endorses low back pain that is severe and debilitating. He plans to appeal his disability denial. He would like be to report that he is unable to perform any form of work including sedentary work.   4. Chronic low back pain: since 2015. Bilateral low back pain. 8-10/10. Worse on L side. Worsening with decreased ROM in low back. His wife now has to bathe him. He is unable to put on shoes independently. Reports that it is started with an injury.  He is need of assistance with transportation.   5. CKD: followed at [facility]. Last OV on 11/10/2015.  Labs 11/10/2015: Cr 1.88, GFR 47,  K+: 4.5.    Past Medical History:  Diagnosis Date  . Atrial fibrillation with RVR ([place])    a. 12/2013 post-op CABG ->on amio.  Marland Kitchen CAD (coronary artery disease)    a. 12/2013 NSTEMI/Cath: 3VD;  b. 12/2013 CABG x 4: LIMA->LAD, VG->RI, VG->OM1->LPDA.  Marland Kitchen Chronic combined systolic and diastolic CHF (congestive heart failure) ([place])    a. 12/2013 Echo: EF 45%, Gr 2 DD.  Marland Kitchen CKD (chronic kidney disease), stage III   . Hypertension    a. Dx ~ 2000.  . Ischemic cardiomyopathy    a. 12/2013 Echo: EF 45%, Gr 2 DD,  basal-mid inflat and inf AK, mild MR.  . Tobacco abuse     Social History  Substance Use Topics  . Smoking status: Former Smoker    Packs/day: 0.50    Years: 20.00    Types: Cigarettes    Quit date: 12/24/2013  . Smokeless tobacco: Never Used  . Alcohol use No     Comment: socially, 1-2 x year   Outpatient Medications Prior to Visit  Medication Sig Dispense Refill  . amLODipine (NORVASC) 10 MG tablet Take 1 tablet (10 mg total) by mouth daily. 90 tablet 3  . aspirin EC 81 MG tablet Take 1 tablet (81 mg total) by mouth daily. 90 tablet 3  . atorvastatin (LIPITOR) 80 MG tablet Take 1 tablet (80 mg total) by mouth daily. 90 tablet 3  . Cholecalciferol (VITAMIN D) 2000 units tablet Take 1 tablet (2,000 Units total) by mouth daily. 90 tablet 3  . clopidogrel (PLAVIX) 75 MG tablet Take 1 tablet (75 mg total) by mouth daily. 90 tablet 3  . furosemide (LASIX) 40 MG tablet Take 1 tablet (40 mg total) by mouth daily. 90 tablet 3  . metoprolol tartrate (LOPRESSOR) 25 MG tablet TAKE 1 TABLET BY MOUTH 2 TIMES DAILY. 180 tablet 3  . nitroGLYCERIN (NITROSTAT) 0.4 MG SL tablet Place  1 tablet (0.4 mg total) under the tongue every 5 (five) minutes as needed for chest pain. 30 tablet 3  . potassium chloride SA (KLOR-CON M20) 20 MEQ tablet Take 2 tablets (40 mEq total) by mouth daily. 180 tablet 3   No facility-administered medications prior to visit.     ROS Review of Systems  Constitutional: Negative for chills, fatigue, fever and unexpected weight change.  Eyes: Negative for visual disturbance.  Respiratory: Negative for cough and shortness of breath.   Cardiovascular: Positive for chest pain and leg swelling. Negative for palpitations.  Gastrointestinal: Negative for abdominal pain, blood in stool, constipation, diarrhea, nausea and vomiting.  Endocrine: Negative for polydipsia, polyphagia and polyuria.  Musculoskeletal: Positive for back pain (low back since 2015). Negative for arthralgias,  gait problem, myalgias and neck pain.  Skin: Negative for rash.  Allergic/Immunologic: Negative for immunocompromised state.  Hematological: Negative for adenopathy. Does not bruise/bleed easily.  Psychiatric/Behavioral: Negative for dysphoric mood, sleep disturbance and suicidal ideas. The patient is not nervous/anxious.     Objective:  BP (!) 151/87 (BP Location: Right Arm, Patient Position: Sitting, Cuff Size: Large)   Pulse 65   Temp 98.4 F (36.9 C) (Oral)   Ht 5\' 9"  (1.753 m)   Wt 269 lb 3.2 oz (122.1 kg)   SpO2 98%   BMI 39.75 kg/m   BP/Weight 12/22/2015 10/01/7515 0/0/1749  Systolic BP 449 675 916  Diastolic BP 87 86 80  Wt. (Lbs) 269.2 256 254  BMI 39.75 37.79 36.45    Physical Exam  Constitutional: He appears well-developed and well-nourished. No distress.  HENT:  Head: Normocephalic and atraumatic.  Neck: Normal range of motion. Neck supple.  Cardiovascular: Normal rate, regular rhythm, normal heart sounds and intact distal pulses.   Pulmonary/Chest: Effort normal and breath sounds normal.  Musculoskeletal: He exhibits edema (trace in b/l LE ).  Back Exam: Back: Normal Curvature, no deformities or CVA tenderness  Paraspinal Tenderness: b/l lumbar   LE Strength 5/5  LE Sensation: in tact  LE Reflexes 2+ and symmetric  Straight leg raise: negative    Neurological: He is alert.  Skin: Skin is warm and dry. No rash noted. No erythema.  Psychiatric: He has a normal mood and affect.   Assessment & Plan:   There are no diagnoses linked to this encounter. Seth Keller was seen today for back pain.  Diagnoses and all orders for this visit:  Chronic bilateral low back pain without sciatica -     acetaminophen-codeine (TYLENOL #4) 300-60 MG tablet; Take 1 tablet by mouth 2 (two) times daily. -     MR Lumbar Spine Wo Contrast; Future  CKD (chronic kidney disease), stage III -     furosemide (LASIX) 40 MG tablet; Take 1 tablet (40 mg total) by mouth daily.  Chronic  combined systolic and diastolic CHF (congestive heart failure) ([facility]) -     furosemide (LASIX) 40 MG tablet; Take 1 tablet (40 mg total) by mouth daily.   No orders of the defined types were placed in this encounter.   Follow-up: Return in about 4 weeks (around 01/19/2016) for HTN .   Boykin Nearing MD

## 2015-12-22 NOTE — Patient Instructions (Addendum)
Seth Keller was seen today for back pain.  Diagnoses and all orders for this visit:  Chronic bilateral low back pain without sciatica -     acetaminophen-codeine (TYLENOL #4) 300-60 MG tablet; Take 1 tablet by mouth 2 (two) times daily. -     MR Lumbar Spine Wo Contrast; Future  CKD (chronic kidney disease), stage III -     furosemide (LASIX) 40 MG tablet; Take 1 tablet (40 mg total) by mouth daily.  Chronic combined systolic and diastolic CHF (congestive heart failure) (H[facility]) -     furosemide (LASIX) 40 MG tablet; Take 1 tablet (40 mg total) by mouth daily.   Double lasix to 40 mg twice daily for next week, then back to once daily  SCAT form will be faxed in  F/u in 4 weeks for low back pain and HTN (if BP still elevated at follow up will change metoprolol to coreg for better BP reduction)   Dr. Adrian Blackwater

## 2015-12-22 NOTE — Assessment & Plan Note (Signed)
A: chronic low back pain interfering with function  P: Tylenol #4 for pain control MRI to evaluate low back

## 2015-12-22 NOTE — Assessment & Plan Note (Signed)
A: Uncontrolled HTN in setting of edema, weight gain. No exercise and diet non compliance P: Double lasix to 40 mg twice daily for next week, then back to once daily   F/u in 4 weeks for low back pain and HTN (if BP still elevated at follow up will change metoprolol to coreg for better BP reduction)

## 2015-12-22 NOTE — Progress Notes (Signed)
Pt is here today for back and chest pain, SOB.  Pt declined flu shot.

## 2015-12-23 ENCOUNTER — Telehealth: Payer: Self-pay | Admitting: Family Medicine

## 2015-12-23 NOTE — Telephone Encounter (Signed)
error 

## 2015-12-23 NOTE — Telephone Encounter (Signed)
Pt calling stating nurse communicated she would be setting up [employer] transportation for his MRI appointment on 11/15 Pt was informed that he needed to contact social services about SC[employer] transportation  Pt was insistent that nurse was setting up transportation for him Pt was informed nurse was call back further explaining situation

## 2015-12-23 NOTE — Addendum Note (Signed)
Addended by: Boykin Nearing on: 12/23/2015 05:42 PM   Modules accepted: Orders

## 2015-12-27 NOTE — Telephone Encounter (Signed)
[employer] form was mailed out on yesterday to the pt for him to sigh his portion of the application.

## 2015-12-30 ENCOUNTER — Telehealth: Payer: Self-pay | Admitting: Family Medicine

## 2015-12-30 NOTE — Telephone Encounter (Signed)
[employer]  Denied the procedure MR Lumbar Spine Wo Contrast  Pt has an appointment 01-03-17 @ 8am  For appeal please, contact [employer] [address] [phone]  Service Order # [id number].   Based on [employer] [facility] Guidelines, we are unable to approve the requested procedure. MRI might be supported in the evaluation of suspected or known spinal disease with one of the following: 1) failure to improve after a recent (within 3 months) 6 week trial of physician-guided clinical care (treatment or observation) with clinical re-evaluation, or 2) any signs or symptoms such as significant motor weakness, recent malignancy or infection, cauda equina syndrome, for which conservative treatment is not needed. The clinical information received fails to support meeting these requirements and, therefore, the requested procedure is not indicated at this time.

## 2016-01-02 ENCOUNTER — Telehealth: Payer: Self-pay | Admitting: Family Medicine

## 2016-01-02 NOTE — Telephone Encounter (Signed)
Patient called the office to inform PCP and nurse that he has yet to receive the forms from [employer] by mail as he was told he would. Forms were missing his signature. Pt doesn't have transportation for his up coming and really needs this service. Please follow up  Thank you.

## 2016-01-03 ENCOUNTER — Telehealth: Payer: Self-pay | Admitting: Family Medicine

## 2016-01-03 NOTE — Telephone Encounter (Signed)
Patient would like to speak to PCP regarding back condition and regarding shortness of breath, stage 3 kidney disease and other ongoing health conditions. Patient states PCP stated in a note he is able to to do sedentary work. Due to this statement worker's comp was stopped. Patient will be appealing worker's comp but won't be able to start until 12/15.   Patient is highly concerned of statement being made without thorough medical evaluation.  Please contact patient.

## 2016-01-03 NOTE — Telephone Encounter (Signed)
Please inform patient That MRI needs to be postponed as his insurance will not cover it at this time  Insurance is requiring at least 6 weeks of physician supervised treatment.  X-ray ordered to evaluate back, he does not need an appointment for x-ray and can go anytime.  Please cancel MRI

## 2016-01-03 NOTE — Telephone Encounter (Signed)
Called back to patient We will appeal denial of long-term disability on the basis of his chronic back pain this time around  He was unable to get MRI done but will get an x-ray Once x-ray done will write an appeal letter. Letter needed before 02/03/2016   He still has not received [employer] form New form filled out and placed in out going mail.

## 2016-01-04 ENCOUNTER — Ambulatory Visit (HOSPITAL_COMMUNITY): Admission: RE | Admit: 2016-01-04 | Payer: Medicaid Other | Source: Ambulatory Visit

## 2016-01-06 NOTE — Telephone Encounter (Signed)
Patient already informed 

## 2016-01-24 ENCOUNTER — Telehealth: Payer: Self-pay | Admitting: Family Medicine

## 2016-01-24 NOTE — Telephone Encounter (Signed)
Please inform patient that letter supporting his appeal for long-term disability has been written.  Please fax to [facility], then mail to patient.   He is reminded to get low back x-ray to help his case. I have reviewed the low back MRI he had on 08/11/2013 at [facility] is was normal.   It can be mailed to him, or both depending on his preference.  faxed to [facility]  Fax # [phone] Incident # [id number] Plan/Policy #: [id number] Plan/Policy Holder: [place].  Administered by: [employer] [place] [employer] [place]

## 2016-01-30 NOTE — Telephone Encounter (Signed)
Paperwork was faxed over on 12/08

## 2016-02-29 ENCOUNTER — Telehealth: Payer: Self-pay | Admitting: Family Medicine

## 2016-02-29 NOTE — Telephone Encounter (Signed)
Will route to PCP 

## 2016-02-29 NOTE — Telephone Encounter (Signed)
Dr. Annye English called requesting to speak to Dr Adrian Blackwater about patient. Please return call at [phone].

## 2016-03-02 NOTE — Telephone Encounter (Signed)
Called # provided  Left message Request call back Informed that I would need a release from patient to discuss details

## 2016-06-08 ENCOUNTER — Other Ambulatory Visit: Payer: Self-pay | Admitting: Family Medicine

## 2016-06-08 DIAGNOSIS — I10 Essential (primary) hypertension: Secondary | ICD-10-CM

## 2016-06-08 DIAGNOSIS — I2510 Atherosclerotic heart disease of native coronary artery without angina pectoris: Secondary | ICD-10-CM

## 2016-06-08 DIAGNOSIS — I5042 Chronic combined systolic (congestive) and diastolic (congestive) heart failure: Secondary | ICD-10-CM

## 2016-06-19 ENCOUNTER — Other Ambulatory Visit: Payer: Self-pay | Admitting: Family Medicine

## 2016-06-19 DIAGNOSIS — N183 Chronic kidney disease, stage 3 unspecified: Secondary | ICD-10-CM

## 2016-06-19 DIAGNOSIS — I5042 Chronic combined systolic (congestive) and diastolic (congestive) heart failure: Secondary | ICD-10-CM

## 2016-06-24 ENCOUNTER — Other Ambulatory Visit: Payer: Self-pay | Admitting: Family Medicine

## 2016-06-24 DIAGNOSIS — I10 Essential (primary) hypertension: Secondary | ICD-10-CM

## 2016-07-04 ENCOUNTER — Encounter: Payer: Self-pay | Admitting: Family Medicine

## 2016-07-20 ENCOUNTER — Other Ambulatory Visit: Payer: Self-pay | Admitting: Family Medicine

## 2016-07-20 DIAGNOSIS — I2510 Atherosclerotic heart disease of native coronary artery without angina pectoris: Secondary | ICD-10-CM

## 2016-08-06 ENCOUNTER — Other Ambulatory Visit: Payer: Self-pay | Admitting: Family Medicine

## 2016-08-06 DIAGNOSIS — I10 Essential (primary) hypertension: Secondary | ICD-10-CM

## 2016-08-06 DIAGNOSIS — Z720 Tobacco use: Secondary | ICD-10-CM

## 2016-08-06 DIAGNOSIS — I2510 Atherosclerotic heart disease of native coronary artery without angina pectoris: Secondary | ICD-10-CM

## 2016-08-06 DIAGNOSIS — N183 Chronic kidney disease, stage 3 unspecified: Secondary | ICD-10-CM

## 2016-08-06 DIAGNOSIS — I255 Ischemic cardiomyopathy: Secondary | ICD-10-CM

## 2016-08-20 ENCOUNTER — Telehealth: Payer: Self-pay | Admitting: Family Medicine

## 2016-08-20 NOTE — Telephone Encounter (Signed)
Seth Keller from [employer] called checking status of request faxed over, please f/up

## 2016-08-21 ENCOUNTER — Ambulatory Visit: Payer: Medicaid Other | Attending: Family Medicine | Admitting: Family Medicine

## 2016-08-21 ENCOUNTER — Encounter: Payer: Self-pay | Admitting: Family Medicine

## 2016-08-21 VITALS — BP 146/78 | HR 62 | Temp 97.8°F | Ht 69.0 in | Wt 242.6 lb

## 2016-08-21 DIAGNOSIS — Z9889 Other specified postprocedural states: Secondary | ICD-10-CM | POA: Insufficient documentation

## 2016-08-21 DIAGNOSIS — I4891 Unspecified atrial fibrillation: Secondary | ICD-10-CM | POA: Insufficient documentation

## 2016-08-21 DIAGNOSIS — Z951 Presence of aortocoronary bypass graft: Secondary | ICD-10-CM | POA: Diagnosis not present

## 2016-08-21 DIAGNOSIS — I130 Hypertensive heart and chronic kidney disease with heart failure and stage 1 through stage 4 chronic kidney disease, or unspecified chronic kidney disease: Secondary | ICD-10-CM | POA: Insufficient documentation

## 2016-08-21 DIAGNOSIS — Z7982 Long term (current) use of aspirin: Secondary | ICD-10-CM | POA: Diagnosis not present

## 2016-08-21 DIAGNOSIS — Z7902 Long term (current) use of antithrombotics/antiplatelets: Secondary | ICD-10-CM | POA: Insufficient documentation

## 2016-08-21 DIAGNOSIS — N183 Chronic kidney disease, stage 3 unspecified: Secondary | ICD-10-CM

## 2016-08-21 DIAGNOSIS — I2510 Atherosclerotic heart disease of native coronary artery without angina pectoris: Secondary | ICD-10-CM | POA: Diagnosis not present

## 2016-08-21 DIAGNOSIS — I5042 Chronic combined systolic (congestive) and diastolic (congestive) heart failure: Secondary | ICD-10-CM

## 2016-08-21 DIAGNOSIS — I10 Essential (primary) hypertension: Secondary | ICD-10-CM | POA: Diagnosis not present

## 2016-08-21 DIAGNOSIS — I255 Ischemic cardiomyopathy: Secondary | ICD-10-CM

## 2016-08-21 DIAGNOSIS — I252 Old myocardial infarction: Secondary | ICD-10-CM | POA: Diagnosis not present

## 2016-08-21 DIAGNOSIS — Z87891 Personal history of nicotine dependence: Secondary | ICD-10-CM | POA: Diagnosis not present

## 2016-08-21 DIAGNOSIS — M7989 Other specified soft tissue disorders: Secondary | ICD-10-CM | POA: Diagnosis not present

## 2016-08-21 MED ORDER — CLOPIDOGREL BISULFATE 75 MG PO TABS: 75.0000 mg | ORAL_TABLET | Freq: Every day | ORAL | 3 refills | Status: DC

## 2016-08-21 MED ORDER — FUROSEMIDE 40 MG PO TABS: ORAL_TABLET | ORAL | 3 refills | Status: DC

## 2016-08-21 MED ORDER — AMLODIPINE BESYLATE 10 MG PO TABS: 10.0000 mg | ORAL_TABLET | Freq: Every day | ORAL | 3 refills | Status: DC

## 2016-08-21 MED ORDER — POTASSIUM CHLORIDE CRYS ER 20 MEQ PO TBCR: 40.0000 meq | EXTENDED_RELEASE_TABLET | Freq: Every day | ORAL | 3 refills | Status: DC

## 2016-08-21 MED ORDER — METOPROLOL TARTRATE 25 MG PO TABS: 25.0000 mg | ORAL_TABLET | Freq: Two times a day (BID) | ORAL | 3 refills | Status: DC

## 2016-08-21 MED ORDER — ASPIRIN EC 81 MG PO TBEC: 81.0000 mg | DELAYED_RELEASE_TABLET | Freq: Every day | ORAL | 3 refills | Status: DC

## 2016-08-21 MED ORDER — ATORVASTATIN CALCIUM 80 MG PO TABS: 80.0000 mg | ORAL_TABLET | Freq: Every day | ORAL | 3 refills | Status: DC

## 2016-08-21 NOTE — Telephone Encounter (Signed)
Paperwork has been received.pt will be called when letter is ready.

## 2016-08-21 NOTE — Progress Notes (Signed)
Subjective:  Patient ID: Seth Keller, male    DOB: 10-Aug-1965  Age: 51 y.o. MRN: [id number]  CC: Hypertension; Congestive Heart Failure; and Chronic Kidney Disease   HPI Seth Keller has CAD, hx of MI s/p NSTEMI and CABG (12/2013), CHF, CKD stage 3, proteinuria he presents for   1. CHF: patient compliant with increased lasix dose of 40 mg daily with potassium. He admit to SOB with exercise. He reports being to walking 1/2 block before having to stop.  He has some leg swelling. Worse on the L.  He reports taking lasix at 8-9 AM and urinating 8-9 times before 12 PM. He last saw cardiology in 10/2014.   2. HTN: compliant with medication regimen, amlodipine 10 mg and lasix 40 mg daily. Has quit smoking. No ETOH. Leg swelling. Worse in L leg.   3. CKD stage 3: followed at [facility] Last OV on 11/10/2015.  Labs 11/10/2015: Cr 1.88, GFR 47,  K+: 4.5.  He was on prinzide but this was stopped during his NSTEMI and CABG in 12/2013 due potassium of 5.4.   Past Medical History:  Diagnosis Date  . Atrial fibrillation with RVR ([place])    a. 12/2013 post-op CABG ->on amio.  Marland Kitchen CAD (coronary artery disease)    a. 12/2013 NSTEMI/Cath: 3VD;  b. 12/2013 CABG x 4: LIMA->LAD, VG->RI, VG->OM1->LPDA.  Marland Kitchen Chronic combined systolic and diastolic CHF (congestive heart failure) ([place])    a. 12/2013 Echo: EF 45%, Gr 2 DD.  Marland Kitchen CKD (chronic kidney disease), stage III   . Hypertension    a. Dx ~ 2000.  . Ischemic cardiomyopathy    a. 12/2013 Echo: EF 45%, Gr 2 DD, basal-mid inflat and inf AK, mild MR.  . Tobacco abuse     Social History  Substance Use Topics  . Smoking status: Former Smoker    Packs/day: 0.50    Years: 20.00    Types: Cigarettes    Quit date: 12/24/2013  . Smokeless tobacco: Never Used  . Alcohol use No     Comment: socially, 1-2 x year   Outpatient Medications Prior to Visit  Medication Sig Dispense Refill  . acetaminophen-codeine (TYLENOL #4) 300-60 MG tablet Take 1 tablet by mouth  2 (two) times daily. 60 tablet 0  . amLODipine (NORVASC) 10 MG tablet TAKE 1 TABLET BY MOUTH ONCE DAILY 90 tablet 1  . aspirin EC 81 MG tablet Take 1 tablet (81 mg total) by mouth daily. 90 tablet 3  . atorvastatin (LIPITOR) 80 MG tablet TAKE ONE TABLET BY MOUTH ONCE DAILY 90 tablet 0  . Cholecalciferol (VITAMIN D) 2000 units tablet Take 1 tablet (2,000 Units total) by mouth daily. 90 tablet 3  . clopidogrel (PLAVIX) 75 MG tablet TAKE ONE TABLET BY MOUTH ONCE DAILY 90 tablet 3  . furosemide (LASIX) 40 MG tablet Take 1 tablet (40 mg total) by mouth daily. 90 tablet 3  . KLOR-CON M20 20 MEQ tablet TAKE TWO TABLETS BY MOUTH ONCE DAILY 180 tablet 3  . metoprolol tartrate (LOPRESSOR) 25 MG tablet TAKE ONE TABLET BY MOUTH TWICE DAILY 180 tablet 3  . nitroGLYCERIN (NITROSTAT) 0.4 MG SL tablet Place 1 tablet (0.4 mg total) under the tongue every 5 (five) minutes as needed for chest pain. 30 tablet 3   No facility-administered medications prior to visit.     ROS Review of Systems  Constitutional: Negative for chills, fatigue, fever and unexpected weight change.  Eyes: Negative for visual disturbance.  Respiratory:  Positive for shortness of breath. Negative for cough.   Cardiovascular: Positive for chest pain and leg swelling. Negative for palpitations.  Gastrointestinal: Negative for abdominal pain, blood in stool, constipation, diarrhea, nausea and vomiting.  Endocrine: Negative for polydipsia, polyphagia and polyuria.  Musculoskeletal: Positive for back pain (low back since 2015). Negative for arthralgias, gait problem, myalgias and neck pain.  Skin: Negative for rash.  Allergic/Immunologic: Negative for immunocompromised state.  Hematological: Negative for adenopathy. Does not bruise/bleed easily.  Psychiatric/Behavioral: Negative for dysphoric mood, sleep disturbance and suicidal ideas. The patient is not nervous/anxious.     Objective:  BP (!) 146/78   Pulse 62   Temp 97.8 F (36.6 C)  (Oral)   Ht '5\' 9"'  (1.753 m)   Wt 242 lb 9.6 oz (110 kg)   SpO2 97%   BMI 35.83 kg/m   BP/Weight 08/21/2016 12/22/2015 3/87/5643  Systolic BP 329 518 841  Diastolic BP 78 87 86  Wt. (Lbs) 242.6 269.2 256  BMI 35.83 39.75 37.79    Physical Exam  Constitutional: He appears well-developed and well-nourished. No distress.  HENT:  Head: Normocephalic and atraumatic.  Neck: Normal range of motion. Neck supple. No JVD present.  Cardiovascular: Normal rate, regular rhythm, normal heart sounds and intact distal pulses.   Pulmonary/Chest: Effort normal and breath sounds normal.  Musculoskeletal: He exhibits edema (trace in b/l LE ).  Neurological: He is alert.  Skin: Skin is warm and dry. No rash noted. No erythema.  Psychiatric: He has a normal mood and affect.     Chemistry      Component Value Date/Time   NA 148 (H) 08/21/2016 1434   K 4.5 08/21/2016 1434   CL 108 (H) 08/21/2016 1434   CO2 25 08/21/2016 1434   BUN 24 08/21/2016 1434   CREATININE 2.29 (H) 08/21/2016 1434   CREATININE 1.88 (H) 06/16/2015 0959      Component Value Date/Time   CALCIUM 9.1 08/21/2016 1434   ALKPHOS 99 08/21/2016 1434   AST 9 08/21/2016 1434   ALT 20 08/21/2016 1434   BILITOT 0.3 08/21/2016 1434     Assessment & Plan:  Seth Keller was seen today for hypertension, congestive heart failure and chronic kidney disease.  Diagnoses and all orders for this visit:  Essential hypertension -     metoprolol tartrate (LOPRESSOR) 25 MG tablet; Take 1 tablet (25 mg total) by mouth 2 (two) times daily. -     CMP14+EGFR -     amLODipine (NORVASC) 10 MG tablet; Take 1 tablet (10 mg total) by mouth daily. -     aspirin EC 81 MG tablet; Take 1 tablet (81 mg total) by mouth daily. -     clopidogrel (PLAVIX) 75 MG tablet; Take 1 tablet (75 mg total) by mouth daily. -     lisinopril (PRINIVIL,ZESTRIL) 10 MG tablet; Take 1 tablet (10 mg total) by mouth daily.  Chronic combined systolic and diastolic CHF (congestive heart  failure) (HCC) -     metoprolol tartrate (LOPRESSOR) 25 MG tablet; Take 1 tablet (25 mg total) by mouth 2 (two) times daily. -     ECHOCARDIOGRAM COMPLETE; Future -     Ambulatory referral to Cardiology -     CMP14+EGFR -     Discontinue: furosemide (LASIX) 40 MG tablet; Take 40 mg by mouth in the morning and 20 mg in the early afternoon -     potassium chloride SA (KLOR-CON M20) 20 MEQ tablet; Take 2 tablets (40 mEq  total) by mouth daily. -     lisinopril (PRINIVIL,ZESTRIL) 10 MG tablet; Take 1 tablet (10 mg total) by mouth daily. -     furosemide (LASIX) 40 MG tablet; Take 1 tablet (40 mg total) by mouth daily.  Coronary artery disease involving native coronary artery of native heart without angina pectoris -     metoprolol tartrate (LOPRESSOR) 25 MG tablet; Take 1 tablet (25 mg total) by mouth 2 (two) times daily. -     Ambulatory referral to Cardiology -     aspirin EC 81 MG tablet; Take 1 tablet (81 mg total) by mouth daily. -     atorvastatin (LIPITOR) 80 MG tablet; Take 1 tablet (80 mg total) by mouth daily. -     clopidogrel (PLAVIX) 75 MG tablet; Take 1 tablet (75 mg total) by mouth daily.  Ischemic cardiomyopathy -     Ambulatory referral to Cardiology -     aspirin EC 81 MG tablet; Take 1 tablet (81 mg total) by mouth daily. -     clopidogrel (PLAVIX) 75 MG tablet; Take 1 tablet (75 mg total) by mouth daily.  CKD (chronic kidney disease), stage III -     CMP14+EGFR -     aspirin EC 81 MG tablet; Take 1 tablet (81 mg total) by mouth daily. -     clopidogrel (PLAVIX) 75 MG tablet; Take 1 tablet (75 mg total) by mouth daily. -     Discontinue: furosemide (LASIX) 40 MG tablet; Take 40 mg by mouth in the morning and 20 mg in the early afternoon -     potassium chloride SA (KLOR-CON M20) 20 MEQ tablet; Take 2 tablets (40 mEq total) by mouth daily. -     Ambulatory referral to Nephrology -     lisinopril (PRINIVIL,ZESTRIL) 10 MG tablet; Take 1 tablet (10 mg total) by mouth daily. -      furosemide (LASIX) 40 MG tablet; Take 1 tablet (40 mg total) by mouth daily.   Follow-up: Return in about 3 months (around 11/21/2016) for OV, 2 weeks for labs .   Boykin Nearing MD

## 2016-08-21 NOTE — Patient Instructions (Addendum)
Seth Keller was seen today for hypertension and back pain.  Diagnoses and all orders for this visit:  Essential hypertension -     metoprolol tartrate (LOPRESSOR) 25 MG tablet; Take 1 tablet (25 mg total) by mouth 2 (two) times daily. -     CMP14+EGFR -     amLODipine (NORVASC) 10 MG tablet; Take 1 tablet (10 mg total) by mouth daily. -     aspirin EC 81 MG tablet; Take 1 tablet (81 mg total) by mouth daily. -     clopidogrel (PLAVIX) 75 MG tablet; Take 1 tablet (75 mg total) by mouth daily.  Chronic combined systolic and diastolic CHF (congestive heart failure) (HCC) -     metoprolol tartrate (LOPRESSOR) 25 MG tablet; Take 1 tablet (25 mg total) by mouth 2 (two) times daily. -     ECHOCARDIOGRAM COMPLETE; Future -     Ambulatory referral to Cardiology -     CMP14+EGFR -     furosemide (LASIX) 40 MG tablet; Take 40 mg by mouth in the morning and 20 mg in the early afternoon -     potassium chloride SA (KLOR-CON M20) 20 MEQ tablet; Take 2 tablets (40 mEq total) by mouth daily.  Coronary artery disease involving native coronary artery of native heart without angina pectoris -     metoprolol tartrate (LOPRESSOR) 25 MG tablet; Take 1 tablet (25 mg total) by mouth 2 (two) times daily. -     Ambulatory referral to Cardiology -     aspirin EC 81 MG tablet; Take 1 tablet (81 mg total) by mouth daily. -     atorvastatin (LIPITOR) 80 MG tablet; Take 1 tablet (80 mg total) by mouth daily. -     clopidogrel (PLAVIX) 75 MG tablet; Take 1 tablet (75 mg total) by mouth daily.  Ischemic cardiomyopathy -     Ambulatory referral to Cardiology -     aspirin EC 81 MG tablet; Take 1 tablet (81 mg total) by mouth daily. -     clopidogrel (PLAVIX) 75 MG tablet; Take 1 tablet (75 mg total) by mouth daily.  CKD (chronic kidney disease), stage III -     CMP14+EGFR -     aspirin EC 81 MG tablet; Take 1 tablet (81 mg total) by mouth daily. -     clopidogrel (PLAVIX) 75 MG tablet; Take 1 tablet (75 mg total) by mouth  daily. -     furosemide (LASIX) 40 MG tablet; Take 40 mg by mouth in the morning and 20 mg in the early afternoon -     potassium chloride SA (KLOR-CON M20) 20 MEQ tablet; Take 2 tablets (40 mEq total) by mouth daily.    F/u in 3 months for HTN and systolic CHF   Dr. Adrian Blackwater   Colonoscopy, Adult A colonoscopy is an exam to look at the large intestine. It is done to check for problems, such as:  Lumps (tumors).  Growths (polyps).  Swelling (inflammation).  Bleeding.  What happens before the procedure? Eating and drinking Follow instructions from your doctor about eating and drinking. These instructions may include:  A few days before the procedure - follow a low-fiber diet. ? Avoid nuts. ? Avoid seeds. ? Avoid dried fruit. ? Avoid raw fruits. ? Avoid vegetables.  1-3 days before the procedure - follow a clear liquid diet. Avoid liquids that have red or purple dye. Drink only clear liquids, such as: ? Clear broth or bouillon. ? Black coffee or tea. ?  Clear juice. ? Clear soft drinks or sports drinks. ? Gelatin dessert. ? Popsicles.  On the day of the procedure - do not eat or drink anything during the 2 hours before the procedure.  Bowel prep If you were prescribed an oral bowel prep:  Take it as told by your doctor. Starting the day before your procedure, you will need to drink a lot of liquid. The liquid will cause you to poop (have bowel movements) until your poop is almost clear or light green.  If your skin or butt gets irritated from diarrhea, you may: ? Wipe the area with wipes that have medicine in them, such as adult wet wipes with aloe and vitamin E. ? Put something on your skin that soothes the area, such as petroleum jelly.  If you throw up (vomit) while drinking the bowel prep, take a break for up to 60 minutes. Then begin the bowel prep again. If you keep throwing up and you cannot take the bowel prep without throwing up, call your doctor.  General  instructions  Ask your doctor about changing or stopping your normal medicines. This is important if you take diabetes medicines or blood thinners.  Plan to have someone take you home from the hospital or clinic. What happens during the procedure?  An IV tube may be put into one of your veins.  You will be given medicine to help you relax (sedative).  To reduce your risk of infection: ? Your doctors will wash their hands. ? Your anal area will be washed with soap.  You will be asked to lie on your side with your knees bent.  Your doctor will get a long, thin, flexible tube ready. The tube will have a camera and a light on the end.  The tube will be put into your anus.  The tube will be gently put into your large intestine.  Air will be delivered into your large intestine to keep it open. You may feel some pressure or cramping.  The camera will be used to take photos.  A small tissue sample may be removed from your body to be looked at under a microscope (biopsy). If any possible problems are found, the tissue will be sent to a lab for testing.  If small growths are found, your doctor may remove them and have them checked for cancer.  The tube that was put into your anus will be slowly removed. The procedure may vary among doctors and hospitals. What happens after the procedure?  Your doctor will check on you often until the medicines you were given have worn off.  Do not drive for 24 hours after the procedure.  You may have a small amount of blood in your poop.  You may pass gas.  You may have mild cramps or bloating in your belly (abdomen).  It is up to you to get the results of your procedure. Ask your doctor, or the department performing the procedure, when your results will be ready. This information is not intended to replace advice given to you by your health care provider. Make sure you discuss any questions you have with your health care provider. Document  Released: 03/10/2010 Document Revised: 12/07/2015 Document Reviewed: 04/19/2015 Elsevier Interactive Patient Education  2017 [employer].

## 2016-08-22 LAB — CMP14+EGFR
A/G RATIO: 1.4 (ref 1.2–2.2)
ALT: 20 IU/L (ref 0–44)
AST: 9 IU/L (ref 0–40)
Albumin: 4 g/dL (ref 3.5–5.5)
Alkaline Phosphatase: 99 IU/L (ref 39–117)
BUN/Creatinine Ratio: 10 (ref 9–20)
BUN: 24 mg/dL (ref 6–24)
Bilirubin Total: 0.3 mg/dL (ref 0.0–1.2)
CALCIUM: 9.1 mg/dL (ref 8.7–10.2)
CO2: 25 mmol/L (ref 20–29)
CREATININE: 2.29 mg/dL — AB (ref 0.76–1.27)
Chloride: 108 mmol/L — ABNORMAL HIGH (ref 96–106)
GFR calc Af Amer: 37 mL/min/{1.73_m2} — ABNORMAL LOW (ref >59)
GFR, EST NON AFRICAN AMERICAN: 32 mL/min/{1.73_m2} — AB (ref >59)
GLUCOSE: 70 mg/dL (ref 65–99)
Globulin, Total: 2.8 g/dL (ref 1.5–4.5)
POTASSIUM: 4.5 mmol/L (ref 3.5–5.2)
Sodium: 148 mmol/L — ABNORMAL HIGH (ref 134–144)
Total Protein: 6.8 g/dL (ref 6.0–8.5)

## 2016-08-22 MED ORDER — FUROSEMIDE 40 MG PO TABS: 40.0000 mg | ORAL_TABLET | Freq: Every day | ORAL | 3 refills | Status: DC

## 2016-08-22 MED ORDER — LISINOPRIL 10 MG PO TABS: 10.0000 mg | ORAL_TABLET | Freq: Every day | ORAL | 2 refills | Status: DC

## 2016-08-22 NOTE — Assessment & Plan Note (Signed)
Systolic CHF due to ischemic cardiomyopathy Repeat ECHO Cardiology referral

## 2016-08-22 NOTE — Assessment & Plan Note (Addendum)
CKD stage 3 Patient has not followed up with nephrology Not on ACE/ARB Has CHF  Plan: Metabolic panel Referral back to nephrology Adding low dose lisinopril 10 mg daily with  close monitoring of Cr, GFR and K+

## 2016-08-22 NOTE — Assessment & Plan Note (Addendum)
A: BP has improved. He still has swelling in legs P:  Lasix 40 mg in AM Norvasc 10 mg daily Adding low dose lasix 10 mg daily with close follow up in 2 weeks of BMP with GFR

## 2016-08-24 ENCOUNTER — Telehealth: Payer: Self-pay

## 2016-08-24 NOTE — Telephone Encounter (Signed)
Pt was called and informed of lab results and new medication sent to pharmacy.

## 2016-09-03 ENCOUNTER — Ambulatory Visit (HOSPITAL_COMMUNITY)
Admission: RE | Admit: 2016-09-03 | Discharge: 2016-09-03 | Disposition: A | Discharge status: Home / Self Care | Payer: Medicaid Other | Source: Ambulatory Visit | Attending: Family Medicine | Admitting: Family Medicine

## 2016-09-03 ENCOUNTER — Encounter: Payer: Self-pay | Admitting: *Deleted

## 2016-09-03 DIAGNOSIS — I5042 Chronic combined systolic (congestive) and diastolic (congestive) heart failure: Secondary | ICD-10-CM | POA: Insufficient documentation

## 2016-09-03 NOTE — Progress Notes (Signed)
  Echocardiogram 2D Echocardiogram has been performed.  Seth Keller 09/03/2016, 3:51 PM

## 2016-09-12 ENCOUNTER — Telehealth: Payer: Self-pay

## 2016-09-12 NOTE — Telephone Encounter (Signed)
Pt was called and informed of lab results. 

## 2016-09-14 ENCOUNTER — Ambulatory Visit: Payer: Medicaid Other | Attending: Family Medicine

## 2016-09-14 DIAGNOSIS — N183 Chronic kidney disease, stage 3 unspecified: Secondary | ICD-10-CM

## 2016-09-15 LAB — BMP8+EGFR
BUN/Creatinine Ratio: 8 — ABNORMAL LOW (ref 9–20)
BUN: 19 mg/dL (ref 6–24)
CALCIUM: 9.5 mg/dL (ref 8.7–10.2)
CO2: 24 mmol/L (ref 20–29)
CREATININE: 2.25 mg/dL — AB (ref 0.76–1.27)
Chloride: 106 mmol/L (ref 96–106)
GFR, EST AFRICAN AMERICAN: 38 mL/min/{1.73_m2} — AB (ref >59)
GFR, EST NON AFRICAN AMERICAN: 33 mL/min/{1.73_m2} — AB (ref >59)
Glucose: 102 mg/dL — ABNORMAL HIGH (ref 65–99)
POTASSIUM: 4.9 mmol/L (ref 3.5–5.2)
Sodium: 144 mmol/L (ref 134–144)

## 2016-09-18 ENCOUNTER — Encounter: Payer: Self-pay | Admitting: Cardiology

## 2016-09-18 ENCOUNTER — Ambulatory Visit (INDEPENDENT_AMBULATORY_CARE_PROVIDER_SITE_OTHER): Payer: Medicaid Other | Admitting: Cardiology

## 2016-09-18 VITALS — BP 146/90 | HR 58 | Ht 69.0 in | Wt 244.4 lb

## 2016-09-18 DIAGNOSIS — R079 Chest pain, unspecified: Secondary | ICD-10-CM

## 2016-09-18 NOTE — Progress Notes (Signed)
09/18/2016 Seth Keller   Dec 09, 1965  [id number]  Primary Physician Seth Nearing, MD Primary Cardiologist: Dr. Angelena Keller    Reason for Visit/CC: F/u for CAD   HPI:  Seth Keller is a 51 y.o. male, followed by Dr. Angelena Keller, who presents back to clinic for f/u given history of CAD and HF.   He has a history of CAD s/p 4V CABG November 2015, HTN, HLD and ischemic cardiomyopathy. He was admitted to [facility] with chest pain and dyspnea and ruled in for NSTEMI 12/26/13. Cardiac cath showed severe three vessel CAD. Echo showed LV dysfunction with an EF of 45%. He underwent CABG x 4. Post-op course was complicated by atrial fibrillation and he was placed on amiodarone with conversion to sinus rhythm. Amiodarone was stopped at his last visit in our office 03/22/14. F/u echo in 2016 showed EF of 45-50%. He has not been seen since 2016.   He was recently seen by his PCP and referred back to our office to [facility] care for management of his CAD. His PCP obtained a f/u echo which showed improvement in EF now at 50-55% but with grade 2DD.   He admits to mild substernal exertional discomfort walking up stairs and also gets short of breath walking up stairs but he denies any exertional symptoms walking of flat surfaces. His CP resolves with rest and he has not required use of SL NTG.  No palpitations, dizziness, syncope/ near syncope. He also notes some atypical chest pain as well that is described as dull pain, substernal, non radiating near his sternotomy scar that is worse with certain weather pattens (rainy, humid weather). He reports full med compliance. His BP is 146/90. His PCP recently added Lisinopril 10 mg at his last OV earlier this month. He has not had f/u bloodwork   Current Meds  Medication Sig  . amLODipine (NORVASC) 10 MG tablet Take 1 tablet (10 mg total) by mouth daily.  Marland Kitchen aspirin EC 81 MG tablet Take 1 tablet (81 mg total) by mouth daily.  Marland Kitchen atorvastatin (LIPITOR) 80 MG tablet Take 1 tablet (80  mg total) by mouth daily.  . Cholecalciferol (VITAMIN D) 2000 units tablet Take 1 tablet (2,000 Units total) by mouth daily.  . clopidogrel (PLAVIX) 75 MG tablet Take 1 tablet (75 mg total) by mouth daily.  . furosemide (LASIX) 40 MG tablet Take 1 tablet (40 mg total) by mouth daily.  Marland Kitchen lisinopril (PRINIVIL,ZESTRIL) 10 MG tablet Take 1 tablet (10 mg total) by mouth daily.  . metoprolol tartrate (LOPRESSOR) 25 MG tablet Take 1 tablet (25 mg total) by mouth 2 (two) times daily.  . nitroGLYCERIN (NITROSTAT) 0.4 MG SL tablet Place 1 tablet (0.4 mg total) under the tongue every 5 (five) minutes as needed for chest pain.  . potassium chloride SA (KLOR-CON M20) 20 MEQ tablet Take 2 tablets (40 mEq total) by mouth daily.   Allergies  Allergen Reactions  . Shellfish Allergy Anaphylaxis    Throat close up, swelling   Past Medical History:  Diagnosis Date  . Atrial fibrillation with RVR ([place])    a. 12/2013 post-op CABG ->on amio.  Marland Kitchen CAD (coronary artery disease)    a. 12/2013 NSTEMI/Cath: 3VD;  b. 12/2013 CABG x 4: LIMA->LAD, VG->RI, VG->OM1->LPDA.  Marland Kitchen Chronic combined systolic and diastolic CHF (congestive heart failure) ([place])    a. 12/2013 Echo: EF 45%, Gr 2 DD.  Marland Kitchen CKD (chronic kidney disease), stage III   . Hypertension    a. Dx ~  2000.  . Ischemic cardiomyopathy    a. 12/2013 Echo: EF 45%, Gr 2 DD, basal-mid inflat and inf AK, mild MR.  . Tobacco abuse    Family History  Problem Relation Age of Onset  . Heart disease Mother 67  . Hypertension Father 67  . Asthma Father   . Diabetes Paternal Grandfather   . Cancer Neg Hx    Past Surgical History:  Procedure Laterality Date  . CORONARY ARTERY BYPASS GRAFT N/A 01/01/2014   Procedure: CORONARY ARTERY BYPASS GRAFTING (CABG) times four using left internal mammary artery and right saphenous vein;  Surgeon: Seth Pollack, MD;  Location: [place] OR;  Service: Open Heart Surgery;  Laterality: N/A;  . INTRAOPERATIVE TRANSESOPHAGEAL ECHOCARDIOGRAM  N/A 01/01/2014   Procedure: INTRAOPERATIVE TRANSESOPHAGEAL ECHOCARDIOGRAM;  Surgeon: Seth Pollack, MD;  Location: [place] OR;  Service: Open Heart Surgery;  Laterality: N/A;  . LEFT HEART CATHETERIZATION WITH CORONARY ANGIOGRAM N/A 12/29/2013   Procedure: LEFT HEART CATHETERIZATION WITH CORONARY ANGIOGRAM;  Surgeon: Seth Grooms, MD;  Location: [facility] CATH LAB;  Service: Cardiovascular;  Laterality: N/A;  . none     Social History   Social History  . Marital status: Single    Spouse name: N/A  . Number of children: 0  . Years of education: 12   Occupational History  . 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 Smoking status: Former Smoker    Packs/day: 0.50    Years: 20.00    Types: Cigarettes    Quit date: 12/24/2013  . Smokeless tobacco: Never Used  . Alcohol use No     Comment: socially, 1-2 x year  . Drug use: No  . Sexual activity: Yes    Birth control/ protection: None   Other Topics Concern  . Not on file   Social History Narrative   Lives with fiance.   From [place], originally.   Moved to [place] in 2012.      Review of Systems: General: negative for chills, fever, night sweats or weight changes.  Cardiovascular: negative for chest pain, dyspnea on exertion, edema, orthopnea, palpitations, paroxysmal nocturnal dyspnea or shortness of breath Dermatological: negative for rash Respiratory: negative for cough or wheezing Urologic: negative for hematuria Abdominal: negative for nausea, vomiting, diarrhea, bright red blood per rectum, melena, or hematemesis Neurologic: negative for visual changes, syncope, or dizziness All other systems reviewed and are otherwise negative except as noted above.   Physical Exam:  Blood pressure (!) 146/90, pulse (!) 58, height 5\' 9"  (1.753 m), weight 244 lb 6.4 oz (110.9 kg).  General appearance: alert, cooperative and no distress Neck: no carotid bruit and no  JVD Lungs: clear to auscultation bilaterally Heart: regular rate and rhythm, S1, S2 normal, no murmur, click, rub or gallop Extremities: extremities normal, atraumatic, no cyanosis or edema Pulses: 2+ and symmetric Skin: Skin color, texture, turgor normal. No rashes or lesions Neurologic: Grossly normal  EKG sinus bradycardia, 58 bpm -- personally reviewed   ASSESSMENT AND PLAN:   1. CAD: s/p CABG in 2015. He has had some exertional CP walking up stairs but no exertional symptoms walking of flat surfaces. Also with atypical CP near his sternotomy scar. He is currently asymptomatic. EKG shows mild sinus bradycardia, 58 bpm, with TW flattening in V5-V6. We will obtain a NST to r/o ischemia. Continue ASA, statin and BB.   2. HTN: 146/90 today. His PCP  recently added lisinopril 20 mg. However he has CKD. He has not had a f/u BMP since starting his ACE-I. Will check f/u BMP today. Pt instructed to avoid salt. Recheck BP at next office visit to see if he will need any further titration of meds. His bradycardia will limit increase of BB.   3. HLD: on statin therapy with Lipitor. No recent FLP on file. We will check FLP when he returns for stress test.   4. Diastolic Dysfunction: Recent Echo 08/2016 showed improvement in EF to normal range at 50-55%, however DD noted. He appears euvolemic on exam. Continue Lasix and BB. Avoid salt.    Follow-Up with me after stress test in 1-2 weeks.   Brittainy Ladoris Gene, MHS [facility] 09/18/2016 12:44 PM

## 2016-09-18 NOTE — Patient Instructions (Addendum)
Medication Instructions:  Your physician recommends that you continue on your current medications as directed. Please refer to the Current Medication list given to you today.   Labwork: Your physician recommends that you return for lab work in 1 week - BMET    Testing/Procedures: Your physician has requested that you return in 1 week to have a [place]. For further information please visit [email or web address]. Please follow instruction sheet, as given.     Follow-Up: Your physician wants you to follow-up in 1 week after test with Lyda Jester, PA.   Any Other Special Instructions Will Be Listed Below (If Applicable).     If you need a refill on your cardiac medications before your next appointment, please call your pharmacy.

## 2016-09-24 ENCOUNTER — Telehealth: Payer: Self-pay

## 2016-09-24 NOTE — Telephone Encounter (Signed)
Pt was called and informed of lab results. 

## 2016-10-03 ENCOUNTER — Telehealth (HOSPITAL_COMMUNITY): Payer: Self-pay | Admitting: *Deleted

## 2016-10-03 NOTE — Telephone Encounter (Signed)
Patient given detailed instructions per Myocardial Perfusion Study Information Sheet for the test on 10/05/16 Patient notified to arrive 15 minutes early and that it is imperative to arrive on time for appointment to keep from having the test rescheduled.  If you need to cancel or reschedule your appointment, please call the office within 24 hours of your appointment. . Patient verbalized understanding. Kirstie Peri

## 2016-10-05 ENCOUNTER — Ambulatory Visit (HOSPITAL_COMMUNITY): Payer: Medicaid Other | Attending: Cardiology

## 2016-10-05 ENCOUNTER — Other Ambulatory Visit: Payer: Medicaid Other | Admitting: *Deleted

## 2016-10-05 DIAGNOSIS — I2510 Atherosclerotic heart disease of native coronary artery without angina pectoris: Secondary | ICD-10-CM | POA: Insufficient documentation

## 2016-10-05 DIAGNOSIS — I10 Essential (primary) hypertension: Secondary | ICD-10-CM | POA: Insufficient documentation

## 2016-10-05 DIAGNOSIS — R079 Chest pain, unspecified: Secondary | ICD-10-CM | POA: Insufficient documentation

## 2016-10-05 DIAGNOSIS — R0609 Other forms of dyspnea: Secondary | ICD-10-CM | POA: Insufficient documentation

## 2016-10-05 DIAGNOSIS — Z951 Presence of aortocoronary bypass graft: Secondary | ICD-10-CM | POA: Insufficient documentation

## 2016-10-05 LAB — BASIC METABOLIC PANEL
BUN / CREAT RATIO: 9 (ref 9–20)
BUN: 19 mg/dL (ref 6–24)
CALCIUM: 9.2 mg/dL (ref 8.7–10.2)
CHLORIDE: 106 mmol/L (ref 96–106)
CO2: 22 mmol/L (ref 20–29)
Creatinine, Ser: 2.13 mg/dL — ABNORMAL HIGH (ref 0.76–1.27)
GFR calc Af Amer: 40 mL/min/{1.73_m2} — ABNORMAL LOW (ref >59)
GFR calc non Af Amer: 35 mL/min/{1.73_m2} — ABNORMAL LOW (ref >59)
GLUCOSE: 94 mg/dL (ref 65–99)
Potassium: 4.5 mmol/L (ref 3.5–5.2)
Sodium: 141 mmol/L (ref 134–144)

## 2016-10-05 LAB — LIPID PANEL
CHOLESTEROL TOTAL: 117 mg/dL (ref 100–199)
Chol/HDL Ratio: 4.2 ratio (ref 0.0–5.0)
HDL: 28 mg/dL — AB (ref >39)
LDL Calculated: 71 mg/dL (ref 0–99)
TRIGLYCERIDES: 92 mg/dL (ref 0–149)
VLDL CHOLESTEROL CAL: 18 mg/dL (ref 5–40)

## 2016-10-05 MED ORDER — TECHNETIUM TC 99M TETROFOSMIN IV KIT
10.1000 | PACK | Freq: Once | INTRAVENOUS | Status: AC | PRN
  Administered 2016-10-05: 10.1 via INTRAVENOUS
  Filled 2016-10-05: qty 11

## 2016-10-05 MED ORDER — TECHNETIUM TC 99M TETROFOSMIN IV KIT
30.6000 | PACK | Freq: Once | INTRAVENOUS | Status: AC | PRN
  Administered 2016-10-05: 30.6 via INTRAVENOUS
  Filled 2016-10-05: qty 31

## 2016-10-05 MED ORDER — REGADENOSON 0.4 MG/5ML IV SOLN
0.4000 mg | Freq: Once | INTRAVENOUS | Status: AC
  Administered 2016-10-05: 0.4 mg via INTRAVENOUS

## 2016-10-08 LAB — MYOCARDIAL PERFUSION IMAGING
CHL CUP NUCLEAR SDS: 4
CSEPPHR: 96 {beats}/min
LV dias vol: 167 mL (ref 62–150)
LVSYSVOL: 92 mL
RATE: 0.3
Rest HR: 54 {beats}/min
SRS: 13
SSS: 17
TID: 0.99

## 2016-10-09 ENCOUNTER — Telehealth: Payer: Self-pay | Admitting: *Deleted

## 2016-10-09 NOTE — Telephone Encounter (Signed)
-----   Message from Consuelo Pandy, [place] sent at 10/08/2016  5:25 PM EDT ----- Stress test is intermediate risk. Given his symptoms, pt will need to f/u in clinic to discuss repeat LHC. Please arrange f/u with APP.

## 2016-10-09 NOTE — Telephone Encounter (Signed)
Patient informed and verbalized understanding of plan. 

## 2016-10-11 ENCOUNTER — Ambulatory Visit: Payer: Medicaid Other | Admitting: Cardiology

## 2016-10-12 ENCOUNTER — Encounter: Payer: Self-pay | Admitting: Cardiology

## 2016-10-12 ENCOUNTER — Ambulatory Visit (INDEPENDENT_AMBULATORY_CARE_PROVIDER_SITE_OTHER): Payer: Medicaid Other | Admitting: Cardiology

## 2016-10-12 VITALS — BP 146/80 | HR 59 | Ht 69.0 in | Wt 243.1 lb

## 2016-10-12 DIAGNOSIS — I2510 Atherosclerotic heart disease of native coronary artery without angina pectoris: Secondary | ICD-10-CM | POA: Diagnosis not present

## 2016-10-12 MED ORDER — LISINOPRIL 20 MG PO TABS: 20.0000 mg | ORAL_TABLET | Freq: Every day | ORAL | 3 refills | Status: DC

## 2016-10-12 NOTE — Progress Notes (Signed)
10/12/2016 Seth Keller   [birth year]  [id number]  Primary Physician Boykin Nearing, MD Primary Cardiologist: Dr. Angelena Form   Reason for Visit/CC: f/u for CAD   HPI:  [name] is a 51 y.o. male, followed by Dr. Angelena Form, who presents back to clinic for f/u given history of CAD and HF.   He has a history of CAD s/p 4V CABG November 2015, HTN, HLD and ischemic cardiomyopathy. He was admitted to [facility] with chest pain and dyspnea and ruled in for NSTEMI 12/26/13. Cardiac cath showed severe three vessel CAD. Echo showed LV dysfunction with an EF of 45%. He underwent CABG x 4. Post-op course was complicated by atrial fibrillation and he was placed on amiodarone with conversion to sinus rhythm. Amiodarone was stopped at his last visit in our office 03/22/14. F/u echo in 2016 showed EF of 45-50%. He was lost to f/u for a period of time and last saw Dr. Angelena Form in 2016.   He was recently seen by his PCP and referred back to our office to [facility] care for management of his CAD. His PCP obtained a f/u echo which showed improvement in EF now at 50-55% but with grade 2DD. At that visit, he also complaint of exertional dyspnea and occasional chest discomfort with moderate to heavy activity, but denied resting symptoms and no symptoms with basic ADLs. We elected to have him undergo a NST. This was performed 10/06/26. This showed 2 defects with large prior inferolateral infarct; small anterobasal infarct with minimal peri-infact ischemia; EF 45 with akinesis of the inferolateral wall and mild LVE. These results have been reviewed personally by Dr. Angelena Form, who feels this is mostly scar. Given his CKD with SCr >2, he has recommended conservative medical management.   Today in clinic, the patient reports that he has continued to do well. He continues to deny any symptoms at rest and no symptoms with basic ADLs. He does however continued to have mild exertional dyspnea with moderate to heavy physical activities.  He is also in agreement that he would like to avoid catheterization given his chronic kidney disease. His BP today is 146/80. Pulse rates 59 bpm, limiting upper titration of his beta blocker. He will like to avoid addition of a new medication if possible. I've discussed with pharmacy increasing his lisinopril dose in the setting of his chronic kidney disease. It has been recommended that his dose be increased to 20 mg daily, for better BP control.  Current Meds  Medication Sig  . amLODipine (NORVASC) 10 MG tablet Take 1 tablet (10 mg total) by mouth daily.  Marland Kitchen aspirin EC 81 MG tablet Take 1 tablet (81 mg total) by mouth daily.  Marland Kitchen atorvastatin (LIPITOR) 80 MG tablet Take 1 tablet (80 mg total) by mouth daily.  . Cholecalciferol (VITAMIN D) 2000 units tablet Take 1 tablet (2,000 Units total) by mouth daily.  . clopidogrel (PLAVIX) 75 MG tablet Take 1 tablet (75 mg total) by mouth daily.  . furosemide (LASIX) 40 MG tablet Take 1 tablet (40 mg total) by mouth daily.  Marland Kitchen lisinopril (PRINIVIL,ZESTRIL) 10 MG tablet Take 1 tablet (10 mg total) by mouth daily.  . metoprolol tartrate (LOPRESSOR) 25 MG tablet Take 1 tablet (25 mg total) by mouth 2 (two) times daily.  . nitroGLYCERIN (NITROSTAT) 0.4 MG SL tablet Place 1 tablet (0.4 mg total) under the tongue every 5 (five) minutes as needed for chest pain.  . potassium chloride SA (KLOR-CON M20) 20 MEQ tablet Take 2  tablets (40 mEq total) by mouth daily.   Allergies  Allergen Reactions  . Shellfish Allergy Anaphylaxis    Throat close up, swelling   Past Medical History:  Diagnosis Date  . Atrial fibrillation with RVR ([place])    a. 12/2013 post-op CABG ->on amio.  Marland Kitchen CAD (coronary artery disease)    a. 12/2013 NSTEMI/Cath: 3VD;  b. 12/2013 CABG x 4: LIMA->LAD, VG->RI, VG->OM1->LPDA.  Marland Kitchen Chronic combined systolic and diastolic CHF (congestive heart failure) ([place])    a. 12/2013 Echo: EF 45%, Gr 2 DD.  Marland Kitchen CKD (chronic kidney disease), stage III   .  Hypertension    a. Dx ~ 2000.  . Ischemic cardiomyopathy    a. 12/2013 Echo: EF 45%, Gr 2 DD, basal-mid inflat and inf AK, mild MR.  . Tobacco abuse    Family History  Problem Relation Age of Onset  . Heart disease Mother 49  . Hypertension Father 61  . Asthma Father   . Diabetes Paternal Grandfather   . Cancer Neg Hx    Past Surgical History:  Procedure Laterality Date  . CORONARY ARTERY BYPASS GRAFT N/A 01/01/2014   Procedure: CORONARY ARTERY BYPASS GRAFTING (CABG) times four using left internal mammary artery and right saphenous vein;  Surgeon: Gaye Pollack, MD;  Location: [facility];  Service: Open Heart Surgery;  Laterality: N/A;  . INTRAOPERATIVE TRANSESOPHAGEAL ECHOCARDIOGRAM N/A 01/01/2014   Procedure: INTRAOPERATIVE TRANSESOPHAGEAL ECHOCARDIOGRAM;  Surgeon: Gaye Pollack, MD;  Location: [facility];  Service: Open Heart Surgery;  Laterality: N/A;  . LEFT HEART CATHETERIZATION WITH CORONARY ANGIOGRAM N/A 12/29/2013   Procedure: LEFT HEART CATHETERIZATION WITH CORONARY ANGIOGRAM;  Surgeon: Sinclair Grooms, MD;  Location: [facility] CATH LAB;  Service: Cardiovascular;  Laterality: N/A;  . none     Social History   Social History  . Marital status: Single    Spouse name: N/A  . Number of children: 0  . Years of education: 12   Occupational History  . 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 Smoking status: Former Smoker    Packs/day: 0.50    Years: 20.00    Types: Cigarettes    Quit date: 12/24/2013  . Smokeless tobacco: Never Used  . Alcohol use No     Comment: socially, 1-2 x year  . Drug use: No  . Sexual activity: Yes    Birth control/ protection: None   Other Topics Concern  . Not on file   Social History Narrative   Lives with fiance.   From [place], originally.   Moved to [place] in 2012.      Review of Systems: General: negative for chills, fever, night sweats or weight changes.    Cardiovascular: negative for chest pain, dyspnea on exertion, edema, orthopnea, palpitations, paroxysmal nocturnal dyspnea or shortness of breath Dermatological: negative for rash Respiratory: negative for cough or wheezing Urologic: negative for hematuria Abdominal: negative for nausea, vomiting, diarrhea, bright red blood per rectum, melena, or hematemesis Neurologic: negative for visual changes, syncope, or dizziness All other systems reviewed and are otherwise negative except as noted above.   Physical Exam:  Blood pressure (!) 146/80, pulse (!) 59, height 5\' 9"  (1.753 m), weight 243 lb 1.9 oz (110.3 kg), SpO2 97 %.  General appearance: alert, cooperative and no distress, moderately obese  Neck: no carotid bruit and no JVD Lungs: clear to auscultation bilaterally Heart: regular rate and  rhythm, S1, S2 normal, no murmur, click, rub or gallop Extremities: extremities normal, atraumatic, no cyanosis or edema Pulses: 2+ and symmetric Skin: Skin color, texture, turgor normal. No rashes or lesions Neurologic: Grossly normal  EKG not performed -- personally reviewed   NST 10/05/16 Study Highlights     Nuclear stress EF: 45%.  There was no ST segment deviation noted during stress.  Defect 1: There is a large defect of severe severity present in the basal inferolateral and mid inferolateral location.  Defect 2: There is a small defect of moderate severity present in the basal anterior location.   Intermediate risk stress nuclear study with large prior inferolateral infarct; small anterobasal infarct with minimal peri-infact ischemia; EF 45 with akinesis of the inferolateral wall and mild LVE.      ASSESSMENT AND PLAN:   1. CAD: history of CAD s/p 4V CABG November 2015. Recent NST 09/2016 showed 2 defects with large prior inferolateral infarct; small anterobasal infarct with minimal peri-infact ischemia; EF 45 with akinesis of the inferolateral wall and mild LVE. Recent echo  showed normal LVEF. These results have been reviewed personally by Dr. Angelena Form, who feels this is mostly scar. Given his CKD with SCr >2, he has recommended conservative medical management and avoidance of LHC.  Today in clinic, the patient reports that he has continued to do well. He continues to deny any symptoms at rest and no symptoms with basic ADLs. He does however continued to have mild exertional dyspnea with moderate to heavy physical activities. This may also be related to his obesity/ deconditioning. He is also in agreement that he would like to avoid catheterization given his chronic kidney disease. His BP today is 146/80. Pulse rates 59 bpm, limiting upper titration of his beta blocker. He is already on the max dose of amlodipine. He would like to avoid addition of a new medication if possible. I've discussed with pharmacy increasing his lisinopril dose in the setting of his chronic kidney disease. It has been recommended that his dose be increased to 20 mg daily, for better BP control and renoprotection. He will continue to monitor symptoms. If his symptoms worsen, occurring with less activity or rest, he will notify our office as he may need further titration of his antianginals and possibly the addition of Imdur. Patient will other wise follow-up with Dr.McAlhany in 6 months.   2. HTN: continue current regimen. Increase lisinopril to 20 mg as outlined above.   3. Obesity: we discussed importance of weight loss.   4. Diastolic Dysfunction: Recent Echo 08/2016 showed improvement in EF to normal range at 50-55%, however DD noted. He appears euvolemic on exam. Continue Lasix and BB. Avoid salt.   5. HLD: recent lipid panel showed LDL at 71. Continue statin therapy with Lipitor. HDL low at 28. We discussed weight loss and increasing physical activity.    Follow-Up w/ Dr. Angelena Form in 6 months, or sooner if needed.  Brittainy Ladoris Gene, MHS [facility] 10/12/2016 9:53 AM

## 2016-10-12 NOTE — Patient Instructions (Addendum)
Medication Instructions:  Your physician has recommended you make the following change in your medication:  1.  INCREASE the Lisinopril to 20 mg daily  Labwork: None ordered  Testing/Procedures: None ordered  Follow-Up: Your physician wants you to follow-up in: [place]  (CALL us SOONER IF YOUR SYMPTOMS WORSEN)   You will receive a reminder letter in the mail two months in advance. If you don't receive a letter, please call our office to schedule the follow-up appointment.    Any Other Special Instructions Will Be Listed Below (If Applicable).     If you need a refill on your cardiac medications before your next appointment, please call your pharmacy.

## 2017-01-17 ENCOUNTER — Other Ambulatory Visit: Payer: Self-pay | Admitting: Pharmacist

## 2017-01-17 DIAGNOSIS — I10 Essential (primary) hypertension: Secondary | ICD-10-CM

## 2017-01-17 MED ORDER — AMLODIPINE BESYLATE 10 MG PO TABS: 10.0000 mg | ORAL_TABLET | Freq: Every day | ORAL | 0 refills | Status: DC

## 2017-03-11 ENCOUNTER — Encounter: Payer: Self-pay | Admitting: Internal Medicine

## 2017-03-11 ENCOUNTER — Ambulatory Visit: Payer: Medicaid Other | Attending: Internal Medicine | Admitting: Internal Medicine

## 2017-03-11 VITALS — BP 125/82 | HR 62 | Temp 98.5°F | Resp 16 | Wt 250.4 lb

## 2017-03-11 DIAGNOSIS — M545 Low back pain: Secondary | ICD-10-CM | POA: Insufficient documentation

## 2017-03-11 DIAGNOSIS — R0602 Shortness of breath: Secondary | ICD-10-CM | POA: Diagnosis not present

## 2017-03-11 DIAGNOSIS — I5042 Chronic combined systolic (congestive) and diastolic (congestive) heart failure: Secondary | ICD-10-CM | POA: Insufficient documentation

## 2017-03-11 DIAGNOSIS — E669 Obesity, unspecified: Secondary | ICD-10-CM | POA: Insufficient documentation

## 2017-03-11 DIAGNOSIS — Z7982 Long term (current) use of aspirin: Secondary | ICD-10-CM | POA: Insufficient documentation

## 2017-03-11 DIAGNOSIS — K5900 Constipation, unspecified: Secondary | ICD-10-CM | POA: Diagnosis not present

## 2017-03-11 DIAGNOSIS — Z951 Presence of aortocoronary bypass graft: Secondary | ICD-10-CM | POA: Insufficient documentation

## 2017-03-11 DIAGNOSIS — Z79899 Other long term (current) drug therapy: Secondary | ICD-10-CM | POA: Diagnosis not present

## 2017-03-11 DIAGNOSIS — N183 Chronic kidney disease, stage 3 unspecified: Secondary | ICD-10-CM

## 2017-03-11 DIAGNOSIS — Z7902 Long term (current) use of antithrombotics/antiplatelets: Secondary | ICD-10-CM | POA: Insufficient documentation

## 2017-03-11 DIAGNOSIS — I25118 Atherosclerotic heart disease of native coronary artery with other forms of angina pectoris: Secondary | ICD-10-CM | POA: Insufficient documentation

## 2017-03-11 DIAGNOSIS — I255 Ischemic cardiomyopathy: Secondary | ICD-10-CM | POA: Diagnosis not present

## 2017-03-11 DIAGNOSIS — Z2821 Immunization not carried out because of patient refusal: Secondary | ICD-10-CM

## 2017-03-11 DIAGNOSIS — Z87891 Personal history of nicotine dependence: Secondary | ICD-10-CM | POA: Diagnosis not present

## 2017-03-11 DIAGNOSIS — M25512 Pain in left shoulder: Secondary | ICD-10-CM | POA: Diagnosis not present

## 2017-03-11 DIAGNOSIS — G8929 Other chronic pain: Secondary | ICD-10-CM | POA: Diagnosis not present

## 2017-03-11 DIAGNOSIS — R3129 Other microscopic hematuria: Secondary | ICD-10-CM | POA: Insufficient documentation

## 2017-03-11 DIAGNOSIS — I130 Hypertensive heart and chronic kidney disease with heart failure and stage 1 through stage 4 chronic kidney disease, or unspecified chronic kidney disease: Secondary | ICD-10-CM | POA: Diagnosis not present

## 2017-03-11 DIAGNOSIS — I10 Essential (primary) hypertension: Secondary | ICD-10-CM | POA: Diagnosis not present

## 2017-03-11 MED ORDER — NITROGLYCERIN 0.4 MG SL SUBL
0.4000 mg | SUBLINGUAL_TABLET | SUBLINGUAL | 3 refills | Status: DC | PRN
Start: 2017-03-11 — End: 2018-06-17

## 2017-03-11 NOTE — Patient Instructions (Signed)
Use the Sublingual Nitroglycerine as needed as discussed today.   Please return to the lab at your convenience to have blood test done.

## 2017-03-11 NOTE — Progress Notes (Signed)
Patient ID: Seth Keller, male    DOB: 1965-02-24  MRN: [id number]  CC: Re-establish and Hypertension   Subjective: Seth Keller is a 52 y.o. male who presents to become est with me as PCP.  Last saw Dr. Adrian Blackwater 08/2016.  His concerns today include:  Pt with hx of CAD s/p CABG x 4, HTN, HL, ICM but last EF 08/2016 improved to 50-55%, 2DD, CKD stage 3, obesity  1.  CKD stage 3:  Last seen by [facility] last summer. Needs to make f/u -making good urine .  -limits salt -no NSAIDS  2.  CAD/ICM:   -feels like Dr. Adrian Blackwater "messed up my disability case because she contradicted herself on certain things."  Said she told him he no longer had swelling in his legs but still "put me on Lisinopril." -+ swelling in LLE, chronic SOB "always with movement like climbing stars like that and any stranous activity." NO SOB with sitting or laying down -+ CP on and off all day.  But not using SL Nitro because  "its not gotten that bad yet." Describes pain as "pinching and cramps."  Not sure of any initiating factors but more so when moving around.  States he was on Tylenol No. 4 for chest pains and back pain.    HM: due for  flu shot and colon CA screen.  Patient Active Problem List   Diagnosis Date Noted  . Chronic bilateral low back pain without sciatica 12/22/2015  . Obesity (BMI 30-39.9) 06/16/2015  . SOB (shortness of breath) 09/27/2014  . Left shoulder pain 09/08/2014  . CAD (coronary artery disease)   . Ischemic cardiomyopathy   . Chronic combined systolic and diastolic CHF (congestive heart failure) ([place])   . CKD (chronic kidney disease), stage III ([place])   . Tobacco abuse, in remission   . Constipation 01/11/2014  . S/P CABG x 4 01/01/2014  . Elevated troponin 12/28/2013  . Essential hypertension 05/08/2012  . Microhematuria 05/08/2012  . Proteinuria 05/08/2012     Current Outpatient Medications on File Prior to Visit  Medication Sig Dispense Refill  . amLODipine (NORVASC) 10 MG  tablet Take 1 tablet (10 mg total) by mouth daily. 30 tablet 0  . aspirin EC 81 MG tablet Take 1 tablet (81 mg total) by mouth daily. 90 tablet 3  . atorvastatin (LIPITOR) 80 MG tablet Take 1 tablet (80 mg total) by mouth daily. 90 tablet 3  . Cholecalciferol (VITAMIN D) 2000 units tablet Take 1 tablet (2,000 Units total) by mouth daily. 90 tablet 3  . clopidogrel (PLAVIX) 75 MG tablet Take 1 tablet (75 mg total) by mouth daily. 90 tablet 3  . furosemide (LASIX) 40 MG tablet Take 1 tablet (40 mg total) by mouth daily. 90 tablet 3  . metoprolol tartrate (LOPRESSOR) 25 MG tablet Take 1 tablet (25 mg total) by mouth 2 (two) times daily. 180 tablet 3  . potassium chloride SA (KLOR-CON M20) 20 MEQ tablet Take 2 tablets (40 mEq total) by mouth daily. 180 tablet 3  . lisinopril (PRINIVIL,ZESTRIL) 20 MG tablet Take 1 tablet (20 mg total) by mouth daily. 90 tablet 3   No current facility-administered medications on file prior to visit.     Allergies  Allergen Reactions  . Shellfish Allergy Anaphylaxis    Throat close up, swelling    Social History   Socioeconomic History  . Marital status: Single    Spouse name: Not on file  . Number of children:  0  . Years of education: 91  . Highest education level: Not on file  Social Needs  . Financial resource strain: Not on file  . Food insecurity - worry: Not on file  . Food insecurity - inability: Not on file  . Transportation needs - medical: Not on file  . Transportation needs - non-medical: Not on file  Occupational History  . 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 Smoking status: Former Smoker    Packs/day: 0.50    Years: 20.00    Pack years: 10.00    Types: Cigarettes    Last attempt to quit: 12/24/2013    Years since quitting: 3.2  . Smokeless tobacco: Never Used  Substance and Sexual Activity  . Alcohol use: No    Alcohol/week: 0.0 oz    Comment: socially, 1-2  x year  . Drug use: No  . Sexual activity: Yes    Birth control/protection: None  Other Topics Concern  . Not on file  Social History Narrative   Lives with fiance.   From [place], originally.   Moved to [place] in 2012.     Family History  Problem Relation Age of Onset  . Heart disease Mother 32  . Hypertension Father 77  . Asthma Father   . Diabetes Paternal Grandfather   . Cancer Neg Hx     Past Surgical History:  Procedure Laterality Date  . CORONARY ARTERY BYPASS GRAFT N/A 01/01/2014   Procedure: CORONARY ARTERY BYPASS GRAFTING (CABG) times four using left internal mammary artery and right saphenous vein;  Surgeon: Gaye Pollack, MD;  Location: [facility] OR;  Service: Open Heart Surgery;  Laterality: N/A;  . INTRAOPERATIVE TRANSESOPHAGEAL ECHOCARDIOGRAM N/A 01/01/2014   Procedure: INTRAOPERATIVE TRANSESOPHAGEAL ECHOCARDIOGRAM;  Surgeon: Gaye Pollack, MD;  Location: [facility];  Service: Open Heart Surgery;  Laterality: N/A;  . LEFT HEART CATHETERIZATION WITH CORONARY ANGIOGRAM N/A 12/29/2013   Procedure: LEFT HEART CATHETERIZATION WITH CORONARY ANGIOGRAM;  Surgeon: Sinclair Grooms, MD;  Location[facility] C[facility] LAB;  Service: Cardiovascular;  Laterality: N/A;  . none      ROS: Review of Systems Negative except as above  PHYSICAL EXAM: BP 125/82   Pulse 62   Temp 98.5 F (36.9 C) (Oral)   Resp 16   Wt 250 lb 6.4 oz (113.6 kg)   SpO2 96%   BMI 36.98 kg/m   Repeat BP 125/82 Wt Readings from Last 3 Encounters:  03/11/17 250 lb 6.4 oz (113.6 kg)  10/12/16 243 lb 1.9 oz (110.3 kg)  09/18/16 244 lb 6.4 oz (110.9 kg)    Physical Exam General appearance - alert, well appearing, middle age AAM and in no distress Mental status - alert, oriented to person, place, and time, normal mood, behavior, speech, dress, motor activity, and thought processes Mouth - mucous membranes moist, pharynx normal without lesions Neck - supple, no significant adenopathy Chest - clear to  auscultation, no wheezes, rales or rhonchi, symmetric air entry Heart - normal rate, regular rhythm, normal S1, S2, no murmurs, rubs, clicks or gallops Extremities -trace lower extremity edema left greater than right.  Hyperpigmentation in the left lower leg.  Some varicose veins noted in the right lower leg calf    Chemistry      Component Value Date/Time   NA 141 10/05/2016 0907   K 4.5 10/05/2016 0907   CL 106 10/05/2016 0907   CO2 22  10/05/2016 0907   BUN 19 10/05/2016 0907   CREATININE 2.13 (H) 10/05/2016 0907   CREATININE 1.88 (H) 06/16/2015 0959      Component Value Date/Time   CALCIUM 9.2 10/05/2016 0907   ALKPHOS 99 08/21/2016 1434   AST 9 08/21/2016 1434   ALT 20 08/21/2016 1434   BILITOT 0.3 08/21/2016 1434     Lab Results  Component Value Date   CHOL 117 10/05/2016   HDL 28 (L) 10/05/2016   LDLCALC 71 10/05/2016   LDLDIRECT 198 (H) 05/29/2012   TRIG 92 10/05/2016   CHOLHDL 4.2 10/05/2016    ASSESSMENT AND PLAN: 1. Coronary artery disease of native artery of native heart with stable angina pectoris ([place]) -His pulse rate would not allow for an increased dose on the beta-blocker so I recommend adding Imdur and referring to cardiology.  However patient prefers to wait until he sees cardiology.  I have refilled nitroglycerin and went over how to use it. -I told him he needs the nitroglycerin for the chest pains not Tylenol No. 4's - nitroGLYCERIN (NITROSTAT) 0.4 MG SL tablet; Place 1 tablet (0.4 mg total) under the tongue every 5 (five) minutes as needed for chest pain.  Dispense: 30 tablet; Refill: 3 - Ambulatory referral to Cardiology - Comprehensive metabolic panel; Future - CBC; Future  2. Ischemic cardiomyopathy Improved EF on last echo.  Continue lisinopril, metoprolol, Plavix and aspirin. -In regards to his concern about his previous PCP I told him that the furosemide is the fluid pill and lisinopril is for heart and blood pressure   3. Essential  hypertension -At goal.  Continue current medications.  Continue to limit salt in foods.   4. CKD (chronic kidney disease), stage III ([facility]) Avoid NSAIDs. - Ambulatory referral to Nephrology  5. Influenza vaccination declined  Patient was given the opportunity to ask questions.  Patient verbalized understanding of the plan and was able to repeat key elements of the plan.   Orders Placed This Encounter  Procedures  . Comprehensive metabolic panel  . CBC  . Ambulatory referral to Nephrology  . Ambulatory referral to Cardiology     Requested Prescriptions   Signed Prescriptions Disp Refills  . nitroGLYCERIN (NITROSTAT) 0.4 MG SL tablet 30 tablet 3    Sig: Place 1 tablet (0.4 mg total) under the tongue every 5 (five) minutes as needed for chest pain.    Return in about 4 months (around 07/09/2017).  Karle Plumber, MD, FACP

## 2017-04-02 ENCOUNTER — Encounter: Payer: Self-pay | Admitting: Physician Assistant

## 2017-04-02 ENCOUNTER — Ambulatory Visit: Payer: Medicaid Other | Admitting: Physician Assistant

## 2017-04-02 VITALS — BP 146/86 | HR 63 | Ht 69.0 in | Wt 249.5 lb

## 2017-04-02 DIAGNOSIS — I10 Essential (primary) hypertension: Secondary | ICD-10-CM | POA: Diagnosis not present

## 2017-04-02 DIAGNOSIS — I25118 Atherosclerotic heart disease of native coronary artery with other forms of angina pectoris: Secondary | ICD-10-CM

## 2017-04-02 DIAGNOSIS — I255 Ischemic cardiomyopathy: Secondary | ICD-10-CM | POA: Diagnosis not present

## 2017-04-02 DIAGNOSIS — N183 Chronic kidney disease, stage 3 unspecified: Secondary | ICD-10-CM

## 2017-04-02 DIAGNOSIS — I5042 Chronic combined systolic (congestive) and diastolic (congestive) heart failure: Secondary | ICD-10-CM

## 2017-04-02 LAB — BASIC METABOLIC PANEL
BUN/Creatinine Ratio: 11 (ref 9–20)
BUN: 21 mg/dL (ref 6–24)
CO2: 23 mmol/L (ref 20–29)
Calcium: 9.2 mg/dL (ref 8.7–10.2)
Chloride: 107 mmol/L — ABNORMAL HIGH (ref 96–106)
Creatinine, Ser: 1.98 mg/dL — ABNORMAL HIGH (ref 0.76–1.27)
GFR calc Af Amer: 44 mL/min/{1.73_m2} — ABNORMAL LOW (ref >59)
GFR calc non Af Amer: 38 mL/min/{1.73_m2} — ABNORMAL LOW (ref >59)
Glucose: 110 mg/dL — ABNORMAL HIGH (ref 65–99)
Potassium: 4.9 mmol/L (ref 3.5–5.2)
Sodium: 143 mmol/L (ref 134–144)

## 2017-04-02 MED ORDER — ISOSORBIDE MONONITRATE ER 30 MG PO TB24: 30.0000 mg | ORAL_TABLET | Freq: Every day | ORAL | 11 refills | Status: DC

## 2017-04-02 NOTE — Progress Notes (Signed)
Cardiology Office Note    Date:  04/02/2017   ID:  Seth Keller, DOB 04-23-1965, MRN [id number]  PCP:  Boykin Nearing, MD  Cardiologist: Lauree Chandler, MD  No chief complaint on file.   History of Present Illness:  Seth Keller is a 52 y.o. male with history of CAD status post CABG x4 12/2013, ischemic cardiomyopathy ejection fraction 45% on echo post CABG, postop atrial fibrillation converted to normal sinus rhythm with amiodarone.  Follow-up echo 2016 EF 45-50%.  Repeat 2D echo showed improved EF 50-55% with grade 2 DD.  Nuclear stress test 10/05/16 2 defects with large prior inferolateral infarct small anterior basal infarct with minimal peri-infarct ischemia ejection fraction 45% with akinesis of the inferior lateral wall.  Dr. Angelena Form reviewed and felt it was mostly scar.  He also has CKD so conservative management was recommended.  Last saw Ellen Henri, PA-C 10/12/16 and lisinopril was increased to 20 mg daily.  Patient comes in today for f/u. He has exertional chest tightness and shortness of breath while cleaning, sweeping-lasted 30 min and took 1 NTG with relief-happened 3 days ago. Occurs 4 times a week.  Says it is about the same as it was the last time he was here.  Has not increased in frequency or duration.  Time to get disability.  Past Medical History:  Diagnosis Date  . Atrial fibrillation with RVR ([place])    a. 12/2013 post-op CABG ->on amio.  Marland Kitchen CAD (coronary artery disease)    a. 12/2013 NSTEMI/Cath: 3VD;  b. 12/2013 CABG x 4: LIMA->LAD, VG->RI, VG->OM1->LPDA.  Marland Kitchen Chronic combined systolic and diastolic CHF (congestive heart failure) ([place])    a. 12/2013 Echo: EF 45%, Gr 2 DD.  Marland Kitchen CKD (chronic kidney disease), stage III ([place])   . Hypertension    a. Dx ~ 2000.  . Ischemic cardiomyopathy    a. 12/2013 Echo: EF 45%, Gr 2 DD, basal-mid inflat and inf AK, mild MR.  . Tobacco abuse     Past Surgical History:  Procedure Laterality Date  . CORONARY ARTERY BYPASS GRAFT  N/A 01/01/2014   Procedure: CORONARY ARTERY BYPASS GRAFTING (CABG) times four using left internal mammary artery and right saphenous vein;  Surgeon: Gaye Pollack, MD;  Location: [facility];  Service: Open Heart Surgery;  Laterality: N/A;  . INTRAOPERATIVE TRANSESOPHAGEAL ECHOCARDIOGRAM N/A 01/01/2014   Procedure: INTRAOPERATIVE TRANSESOPHAGEAL ECHOCARDIOGRAM;  Surgeon: Gaye Pollack, MD;  Location: [facility];  Service: Open Heart Surgery;  Laterality: N/A;  . LEFT HEART CATHETERIZATION WITH CORONARY ANGIOGRAM N/A 12/29/2013   Procedure: LEFT HEART CATHETERIZATION WITH CORONARY ANGIOGRAM;  Surgeon: Sinclair Grooms, MD;  Location[facility] C[facility] LAB;  Service: Cardiovascular;  Laterality: N/A;  . none      Current Medications: Current Meds  Medication Sig  . amLODipine (NORVASC) 10 MG tablet Take 1 tablet (10 mg total) by mouth daily.  Marland Kitchen aspirin EC 81 MG tablet Take 1 tablet (81 mg total) by mouth daily.  Marland Kitchen atorvastatin (LIPITOR) 80 MG tablet Take 1 tablet (80 mg total) by mouth daily.  . Cholecalciferol (VITAMIN D) 2000 units tablet Take 1 tablet (2,000 Units total) by mouth daily.  . clopidogrel (PLAVIX) 75 MG tablet Take 1 tablet (75 mg total) by mouth daily.  . furosemide (LASIX) 40 MG tablet Take 1 tablet (40 mg total) by mouth daily.  . metoprolol tartrate (LOPRESSOR) 25 MG tablet Take 1 tablet (25 mg total) by mouth 2 (two) times daily.  . nitroGLYCERIN (  NITROSTAT) 0.4 MG SL tablet Place 1 tablet (0.4 mg total) under the tongue every 5 (five) minutes as needed for chest pain.  . potassium chloride SA (KLOR-CON M20) 20 MEQ tablet Take 2 tablets (40 mEq total) by mouth daily.     Allergies:   Shellfish allergy   Social History   Socioeconomic History  . Marital status: Single    Spouse name: None  . Number of children: 0  . Years of education: 2  . Highest education level: None  Social Needs  . Financial resource strain: None  . Food insecurity - worry: None  . Food insecurity -  inability: None  . Transportation needs - medical: None  . Transportation needs - non-medical: None  Occupational History  . 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 Smoking status: Former Smoker    Packs/day: 0.50    Years: 20.00    Pack years: 10.00    Types: Cigarettes    Last attempt to quit: 12/24/2013    Years since quitting: 3.2  . Smokeless tobacco: Never Used  Substance and Sexual Activity  . Alcohol use: No    Alcohol/week: 0.0 oz    Comment: socially, 1-2 x year  . Drug use: No  . Sexual activity: Yes    Birth control/protection: None  Other Topics Concern  . None  Social History Narrative   Lives with fiance.   From [place], originally.   Moved to [place] in 2012.      Family History:  The patient's family history includes Asthma in his father; Diabetes in his paternal grandfather; Heart disease (age of onset: 35) in his mother; Hypertension (age of onset: 37) in his father.   ROS:   Please see the history of present illness.    Review of Systems  Constitution: Negative.  HENT: Negative.   Cardiovascular: Positive for chest pain, dyspnea on exertion and leg swelling.  Respiratory: Negative.   Endocrine: Negative.   Hematologic/Lymphatic: Negative.   Musculoskeletal: Negative.   Gastrointestinal: Negative.   Genitourinary: Negative.   Neurological: Negative.    All other systems reviewed and are negative.   PHYSICAL EXAM:   VS:  BP (!) 146/86   Pulse 63   Ht 5\' 9"  (1.753 m)   Wt 249 lb 8 oz (113.2 kg)   SpO2 96%   BMI 36.84 kg/m   Physical Exam  GEN: Well nourished, well developed, in no acute distress  Neck: no JVD, carotid bruits, or masses Cardiac:RRR; no murmurs, rubs, or gallops  Respiratory:  clear to auscultation bilaterally, normal work of breathing GI: soft, nontender, nondistended, + BS Ext: without cyanosis, clubbing, or edema, Good distal pulses  bilaterally Neuro:  Alert and Oriented x 3 Psych: euthymic mood, full affect  Wt Readings from Last 3 Encounters:  04/02/17 249 lb 8 oz (113.2 kg)  03/11/17 250 lb 6.4 oz (113.6 kg)  10/12/16 243 lb 1.9 oz (110.3 kg)      Studies/Labs Reviewed:   EKG:  EKG is not ordered today.  Recent Labs: 08/21/2016: ALT 20 10/05/2016: BUN 19; Creatinine, Ser 2.13; Potassium 4.5; Sodium 141   Lipid Panel    Component Value Date/Time   CHOL 117 10/05/2016 0907   TRIG 92 10/05/2016 0907   HDL 28 (L) 10/05/2016 0907   CHOLHDL 4.2 10/05/2016 0907   CHOLHDL 4.9 04/14/2015 0943   VLDL 22 04/14/2015 0943   LDLCALC 71  10/05/2016 0907   LDLDIRECT 198 (H) 05/29/2012 1744    Additional studies/ records that were reviewed today include:  2D echo 09/03/16 Study Conclusions   - Left ventricle: The cavity size was normal. Wall thickness was   increased in a pattern of mild LVH. Systolic function was normal.   The estimated ejection fraction was in the range of 50% to 55%.   Wall motion was normal; there were no regional wall motion   abnormalities. Features are consistent with a pseudonormal left   ventricular filling pattern, with concomitant abnormal relaxation   and increased filling pressure (grade 2 diastolic dysfunction).    Nuclear stress test 8/2018Study Highlights      Nuclear stress EF: 45%.  There was no ST segment deviation noted during stress.  Defect 1: There is a large defect of severe severity present in the basal inferolateral and mid inferolateral location.  Defect 2: There is a small defect of moderate severity present in the basal anterior location.   Intermediate risk stress nuclear study with large prior inferolateral infarct; small anterobasal infarct with minimal peri-infact ischemia; EF 45 with akinesis of the inferolateral wall and mild LVE.        ASSESSMENT:    1. Coronary artery disease of native artery of native heart with stable angina pectoris ([place])    2. Chronic combined systolic and diastolic CHF (congestive heart failure) ([place])   3. Ischemic cardiomyopathy   4. Essential hypertension   5. CKD (chronic kidney disease), stage III ([facility])      PLAN:  In order of problems listed above:  CAD status post CABG x411/2015.  NST 09/2016 2 large defects EF 45% Dr. Angelena Form reviewed and felt it was mostly scar.  Medical management recommended.  Patient continues to have exertional angina that has not changed since his last office visit.  We will try adding Imdur 30 mg once daily to see if this helps.  Follow-up with Dr. Angelena Form.  Chronic combined systolic and diastolic CHF well compensated  Ischemic cardiomyopathy EF 45% stable  Essential hypertension blood pressure up slightly so have room to add Imdur  CKD stage III lisinopril increased to 20 mg daily last office visit.  Will repeat bmet today.      Medication Adjustments/Labs and Tests Ordered: Current medicines are reviewed at length with the patient today.  Concerns regarding medicines are outlined above.  Medication changes, Labs and Tests ordered today are listed in the Patient Instructions below. Patient Instructions  Medication Instructions:  1) START IMDUR 30 mg daily  Labwork: TODAY: BMET  Testing/Procedures: None  Follow-Up: You have an appointment scheduled with Dr. Angelena Form on Jul 03, 2017 at 10:00AM.  Please arrive 15 minutes prior to your appointment.  Any Other Special Instructions Will Be Listed Below (If Applicable).     If you need a refill on your cardiac medications before your next appointment, please call your pharmacy.      Elder, Davidian, PA-C  04/02/2017 9:53 AM    [facility] [address], [place], [place]  [address] Phone: [phone]; Fax: [phone]

## 2017-04-02 NOTE — Patient Instructions (Addendum)
Medication Instructions:  1) START IMDUR 30 mg daily  Labwork: TODAY: BMET  Testing/Procedures: None  Follow-Up: You have an appointment scheduled with Dr. Angelena Form on Jul 03, 2017 at 10:00AM.  Please arrive 15 minutes prior to your appointment.  Any Other Special Instructions Will Be Listed Below (If Applicable).     If you need a refill on your cardiac medications before your next appointment, please call your pharmacy.

## 2017-05-22 ENCOUNTER — Telehealth: Payer: Self-pay | Admitting: Family Medicine

## 2017-05-22 NOTE — Telephone Encounter (Signed)
2 page, paperwork received through fax 05-22-17.

## 2017-07-03 ENCOUNTER — Ambulatory Visit: Payer: Medicaid Other | Admitting: Cardiovascular Disease

## 2017-07-28 NOTE — Progress Notes (Signed)
Chief Complaint  Patient presents with  . Follow-up    CAD    History of Present Illness: 52 yo male with history of CKD, CAD s/p 4V CABG November 2015, HTN, HLD, ischemic cardiomyopathy here today for cardiac follow up. He was admitted to [facility] with a NSTEMI in November 2015. Cardiac cath in 2015 showed severe three vessel CAD. Echo showed LV dysfunction with an EF of 45%. He underwent CABG x 4. Post-op course was complicated by atrial fibrillation and he was placed on amiodarone with conversion to sinus rhythm. Amiodarone was stopped at his office visit in February 2016.  Echo in July 2018 with LVEF=50-55% and no wall motion abnormalities. Nuclear stress test in 2018 with evidence of scar but no large areas of ischemia. Imdur added at office visit in February 2019 for NTG responsive chest pain occurring with moderate exertion.   He is here today for follow up. The patient denies any palpitations, lower extremity edema, orthopnea, PND, dizziness, near syncope or syncope. He has exertional chest pain but no change over last year. The pain is mild. He has dyspnea with minimal exertion. This is stable over the past two years. He continues to have LE edema but it is well controlled on Lasix.   Primary Care Physician: Ladell Pier, MD   Past Medical History:  Diagnosis Date  . Atrial fibrillation with RVR ([place])    a. 12/2013 post-op CABG ->on amio.  Marland Kitchen CAD (coronary artery disease)    a. 12/2013 NSTEMI/Cath: 3VD;  b. 12/2013 CABG x 4: LIMA->LAD, VG->RI, VG->OM1->LPDA.  Marland Kitchen Chronic combined systolic and diastolic CHF (congestive heart failure) ([place])    a. 12/2013 Echo: EF 45%, Gr 2 DD.  Marland Kitchen CKD (chronic kidney disease), stage III ([place])   . Hypertension    a. Dx ~ 2000.  . Ischemic cardiomyopathy    a. 12/2013 Echo: EF 45%, Gr 2 DD, basal-mid inflat and inf AK, mild MR.  . Tobacco abuse     Past Surgical History:  Procedure Laterality Date  . CORONARY ARTERY BYPASS GRAFT N/A  01/01/2014   Procedure: CORONARY ARTERY BYPASS GRAFTING (CABG) times four using left internal mammary artery and right saphenous vein;  Surgeon: Gaye Pollack, MD;  Location: [facility] OR;  Service: Open Heart Surgery;  Laterality: N/A;  . INTRAOPERATIVE TRANSESOPHAGEAL ECHOCARDIOGRAM N/A 01/01/2014   Procedure: INTRAOPERATIVE TRANSESOPHAGEAL ECHOCARDIOGRAM;  Surgeon: Gaye Pollack, MD;  Location: [facility];  Service: Open Heart Surgery;  Laterality: N/A;  . LEFT HEART CATHETERIZATION WITH CORONARY ANGIOGRAM N/A 12/29/2013   Procedure: LEFT HEART CATHETERIZATION WITH CORONARY ANGIOGRAM;  Surgeon: Sinclair Grooms, MD;  Location: [facility] CATH LAB;  Service: Cardiovascular;  Laterality: N/A;  . none      Current Outpatient Medications  Medication Sig Dispense Refill  . amLODipine (NORVASC) 10 MG tablet Take 1 tablet (10 mg total) by mouth daily. 30 tablet 0  . aspirin EC 81 MG tablet Take 1 tablet (81 mg total) by mouth daily. 90 tablet 3  . atorvastatin (LIPITOR) 80 MG tablet Take 1 tablet (80 mg total) by mouth daily. 90 tablet 3  . Cholecalciferol (VITAMIN D) 2000 units tablet Take 1 tablet (2,000 Units total) by mouth daily. 90 tablet 3  . clopidogrel (PLAVIX) 75 MG tablet Take 1 tablet (75 mg total) by mouth daily. 90 tablet 3  . furosemide (LASIX) 40 MG tablet Take 1 tablet (40 mg total) by mouth daily. 90 tablet 3  . metoprolol  tartrate (LOPRESSOR) 25 MG tablet Take 1 tablet (25 mg total) by mouth 2 (two) times daily. 180 tablet 3  . nitroGLYCERIN (NITROSTAT) 0.4 MG SL tablet Place 1 tablet (0.4 mg total) under the tongue every 5 (five) minutes as needed for chest pain. 30 tablet 3  . potassium chloride SA (KLOR-CON M20) 20 MEQ tablet Take 2 tablets (40 mEq total) by mouth daily. 180 tablet 3  . isosorbide mononitrate (IMDUR) 60 MG 24 hr tablet Take 1 tablet (60 mg total) by mouth daily. 90 tablet 3  . lisinopril (PRINIVIL,ZESTRIL) 20 MG tablet Take 1 tablet (20 mg total) by mouth daily. 90 tablet 3    No current facility-administered medications for this visit.     Allergies  Allergen Reactions  . Shellfish Allergy Anaphylaxis    Throat close up, swelling    Social History   Socioeconomic History  . Marital status: Single    Spouse name: Not on file  . Number of children: 0  . Years of education: 26  . Highest education level: Not on file  Occupational History  . 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 Financial resource strain: Not on file  . Food insecurity:    Worry: Not on file    Inability: Not on file  . Transportation needs:    Medical: Not on file    Non-medical: Not on file  Tobacco Use  . Smoking status: Former Smoker    Packs/day: 0.50    Years: 20.00    Pack years: 10.00    Types: Cigarettes    Last attempt to quit: 12/24/2013    Years since quitting: 3.5  . Smokeless tobacco: Never Used  Substance and Sexual Activity  . Alcohol use: No    Alcohol/week: 0.0 oz    Comment: socially, 1-2 x year  . Drug use: No  . Sexual activity: Yes    Birth control/protection: None  Lifestyle  . Physical activity:    Days per week: Not on file    Minutes per session: Not on file  . Stress: Not on file  Relationships  . Social connections:    Talks on phone: Not on file    Gets together: Not on file    Attends religious service: Not on file    Active member of club or organization: Not on file    Attends meetings of clubs or organizations: Not on file    Relationship status: Not on file  . Intimate partner violence:    Fear of current or ex partner: Not on file    Emotionally abused: Not on file    Physically abused: Not on file    Forced sexual activity: Not on file  Other Topics Concern  . Not on file  Social History Narrative   Lives with fiance.   From [place], originally.   Moved to [place] in 2012.     Family History  Problem Relation Age of Onset  . Heart disease  Mother 85  . Hypertension Father 81  . Asthma Father   . Diabetes Paternal Grandfather   . Cancer Neg Hx     Review of Systems:  As stated in the HPI and otherwise negative.   BP 138/72   Pulse (!) 59   Ht 5\' 9"  (1.753 m)   Wt 243 lb 3.2 oz (110.3 kg)   SpO2 98%   BMI 35.91 kg/m  Physical Examination:  General: Well developed, well nourished, NAD  HEENT: OP clear, mucus membranes moist  SKIN: warm, dry. No rashes. Neuro: No focal deficits  Musculoskeletal: Muscle strength 5/5 all ext  Psychiatric: Mood and affect normal  Neck: No JVD, no carotid bruits, no thyromegaly, no lymphadenopathy.  Lungs:Clear bilaterally, no wheezes, rhonci, crackles Cardiovascular: Regular rate and rhythm. No murmurs, gallops or rubs. Abdomen:Soft. Bowel sounds present. Non-tender.  Extremities: No lower extremity edema. Pulses are 2 + in the bilateral DP/PT.  Echo July 2018: - Left ventricle: The cavity size was normal. Wall thickness was   increased in a pattern of mild LVH. Systolic function was normal.   The estimated ejection fraction was in the range of 50% to 55%.   Wall motion was normal; there were no regional wall motion   abnormalities. Features are consistent with a pseudonormal left   ventricular filling pattern, with concomitant abnormal relaxation   and increased filling pressure (grade 2 diastolic dysfunction).  Cardiac cath 12/29/13: The left main coronary artery is widely patent. The left anterior descending artery is large and wraps around the left ventricular apex. There is a proximal eccentric 70% stenosis and a mid eccentric 60-70% stenosis. FFR beyond the mid vessel stenosis was 0.78. The apical LAD contains 90% segmental stenosis before the apical segment. The first diagonal is totally occluded. The left circumflex artery is severely and diffusely diseased. The mid circumflex contains an eccentric 80% stenosis. There is total occlusion of a moderate sized first obtuse  marginal that arises beyond an 80% stenosis in the mid vessel. The distal vessel is diffusely diseased with up to 85% stenosis before the margin of the second obtuse marginal and the PDA.Marland Kitchen The ramus intermedius is moderate in size and contains proximal to mid diffuse disease with up to 80% stenosis. The distal vessel may not be graftable. The right coronary artery is nondominant. The right coronary is totally occluded in the mid-segment.  EKG:  EKG is ordered today. The ekg ordered today demonstrates Sinus brady, rate 59 bpm. LAFB. Inf infarct. Unchanged  Recent Labs: 08/21/2016: ALT 20 04/02/2017: BUN 21; Creatinine, Ser 1.98; Potassium 4.9; Sodium 143   Lipid Panel    Component Value Date/Time   CHOL 117 10/05/2016 0907   TRIG 92 10/05/2016 0907   HDL 28 (L) 10/05/2016 0907   CHOLHDL 4.2 10/05/2016 0907   CHOLHDL 4.9 04/14/2015 0943   VLDL 22 04/14/2015 0943   LDLCALC 71 10/05/2016 0907   LDLDIRECT 198 (H) 05/29/2012 1744     Wt Readings from Last 3 Encounters:  07/29/17 243 lb 3.2 oz (110.3 kg)  04/02/17 249 lb 8 oz (113.2 kg)  03/11/17 250 lb 6.4 oz (113.6 kg)     Other studies Reviewed: Additional studies/ records that were reviewed today include:. Review of the above records demonstrates:   Assessment and Plan:   1. CAD s/p CABG with angina: He is having exertional chest pain but not change in frequency or severity. I will increase Imdur to 60 mg daily. We have discussed a cardiac cath but given his kidney disease, I would prefer to hold off for now. Will continue ASA, Plavix, statin and beta blocker.  Last LDL in August 2018 at goal.   2. Ischemic cardiomyopathy: LVEF 50-555% by echo in July 2018.   3. HTN: BP is controlled. NO changes.   4. Tobacco abuse, in remission  5. Chronic systolic CHF: Weight controlled and volume status is ok. Continue Lasix.  Current medicines are reviewed at length with the patient today.  The patient does not have concerns regarding  medicines.  The following changes have been made:  no change  Labs/ tests ordered today include:   Orders Placed This Encounter  Procedures  . EKG 12-Lead    Disposition:   FU with me  in 6 months  Signed, Lauree Chandler, MD 07/29/2017 11:15 AM    [facility] [address], [place], [place]  [address] Phone: [phone]; Fax: [phone]

## 2017-07-29 ENCOUNTER — Encounter: Payer: Self-pay | Admitting: Cardiovascular Disease

## 2017-07-29 ENCOUNTER — Ambulatory Visit (INDEPENDENT_AMBULATORY_CARE_PROVIDER_SITE_OTHER): Payer: Medicaid Other | Admitting: Cardiovascular Disease

## 2017-07-29 VITALS — BP 138/72 | HR 59 | Ht 69.0 in | Wt 243.2 lb

## 2017-07-29 DIAGNOSIS — I25118 Atherosclerotic heart disease of native coronary artery with other forms of angina pectoris: Secondary | ICD-10-CM

## 2017-07-29 DIAGNOSIS — I5042 Chronic combined systolic (congestive) and diastolic (congestive) heart failure: Secondary | ICD-10-CM | POA: Diagnosis not present

## 2017-07-29 DIAGNOSIS — I255 Ischemic cardiomyopathy: Secondary | ICD-10-CM | POA: Diagnosis not present

## 2017-07-29 DIAGNOSIS — I10 Essential (primary) hypertension: Secondary | ICD-10-CM

## 2017-07-29 MED ORDER — ISOSORBIDE MONONITRATE ER 60 MG PO TB24: 60.0000 mg | ORAL_TABLET | Freq: Every day | ORAL | 3 refills | Status: DC

## 2017-07-29 NOTE — Patient Instructions (Signed)
Your physician has recommended you make the following change in your medication:  1.) increase dose of isosorbide (IMDUR) TO 60 MG once a day  Your physician wants you to follow-up in: 6 months with Dr. Angelena Form.  You will receive a reminder letter in the mail two months in advance. If you don't receive a letter, please call our office to schedule the follow-up appointment.

## 2017-09-06 ENCOUNTER — Other Ambulatory Visit: Payer: Self-pay

## 2017-09-06 DIAGNOSIS — N183 Chronic kidney disease, stage 3 unspecified: Secondary | ICD-10-CM

## 2017-09-06 DIAGNOSIS — I10 Essential (primary) hypertension: Secondary | ICD-10-CM

## 2017-09-06 DIAGNOSIS — I2510 Atherosclerotic heart disease of native coronary artery without angina pectoris: Secondary | ICD-10-CM

## 2017-09-06 DIAGNOSIS — I5042 Chronic combined systolic (congestive) and diastolic (congestive) heart failure: Secondary | ICD-10-CM

## 2017-09-06 MED ORDER — METOPROLOL TARTRATE 25 MG PO TABS: 25.0000 mg | ORAL_TABLET | Freq: Two times a day (BID) | ORAL | 2 refills | Status: DC

## 2017-09-06 MED ORDER — FUROSEMIDE 40 MG PO TABS: 40.0000 mg | ORAL_TABLET | Freq: Every day | ORAL | 2 refills | Status: DC

## 2017-09-06 NOTE — Telephone Encounter (Signed)
Patient called and would like a refill on listed medications. [facility] [place]   metoprolol tartrate (LOPRESSOR) 25 MG tablet  furosemide (LASIX) 40 MG tablet

## 2017-09-09 ENCOUNTER — Other Ambulatory Visit: Payer: Self-pay

## 2017-09-09 DIAGNOSIS — N183 Chronic kidney disease, stage 3 unspecified: Secondary | ICD-10-CM

## 2017-09-09 DIAGNOSIS — I5042 Chronic combined systolic (congestive) and diastolic (congestive) heart failure: Secondary | ICD-10-CM

## 2017-09-09 MED ORDER — POTASSIUM CHLORIDE CRYS ER 20 MEQ PO TBCR: 40.0000 meq | EXTENDED_RELEASE_TABLET | Freq: Every day | ORAL | 0 refills | Status: DC

## 2017-09-30 ENCOUNTER — Other Ambulatory Visit: Payer: Self-pay | Admitting: Cardiology

## 2017-10-04 ENCOUNTER — Other Ambulatory Visit: Payer: Self-pay | Admitting: Internal Medicine

## 2017-10-04 DIAGNOSIS — N183 Chronic kidney disease, stage 3 unspecified: Secondary | ICD-10-CM

## 2017-10-04 DIAGNOSIS — I5042 Chronic combined systolic (congestive) and diastolic (congestive) heart failure: Secondary | ICD-10-CM

## 2017-10-07 NOTE — Telephone Encounter (Signed)
Patients phone is not on.

## 2017-10-08 ENCOUNTER — Other Ambulatory Visit: Payer: Self-pay | Admitting: Internal Medicine

## 2017-10-08 DIAGNOSIS — N183 Chronic kidney disease, stage 3 unspecified: Secondary | ICD-10-CM

## 2017-10-08 DIAGNOSIS — I5042 Chronic combined systolic (congestive) and diastolic (congestive) heart failure: Secondary | ICD-10-CM

## 2017-10-08 NOTE — Telephone Encounter (Signed)
Patient needs to schedule an appointment before refills can be authorized, please call and schedule them then route request back to me.Thanks!

## 2017-10-08 NOTE — Telephone Encounter (Signed)
Pt called to request a medication refill on -potassium chloride SA (KLOR-CON M20) 20 MEQ tablet To  -[facility] ([facility] [place] [address] Please follow up

## 2017-10-09 NOTE — Telephone Encounter (Signed)
Pt has an appointment scheduled on 10/25/2017 with PCP

## 2017-10-11 MED ORDER — POTASSIUM CHLORIDE CRYS ER 20 MEQ PO TBCR: 40.0000 meq | EXTENDED_RELEASE_TABLET | Freq: Every day | ORAL | 0 refills | Status: DC

## 2017-10-18 ENCOUNTER — Ambulatory Visit: Payer: Medicaid Other | Admitting: Internal Medicine

## 2017-10-25 ENCOUNTER — Other Ambulatory Visit: Payer: Self-pay

## 2017-10-25 ENCOUNTER — Ambulatory Visit: Payer: Medicaid Other | Attending: Internal Medicine | Admitting: Internal Medicine

## 2017-10-25 ENCOUNTER — Encounter: Payer: Self-pay | Admitting: Internal Medicine

## 2017-10-25 VITALS — BP 126/77 | HR 60 | Temp 98.8°F | Resp 16 | Wt 249.2 lb

## 2017-10-25 DIAGNOSIS — Z951 Presence of aortocoronary bypass graft: Secondary | ICD-10-CM | POA: Diagnosis not present

## 2017-10-25 DIAGNOSIS — I10 Essential (primary) hypertension: Secondary | ICD-10-CM

## 2017-10-25 DIAGNOSIS — N183 Chronic kidney disease, stage 3 unspecified: Secondary | ICD-10-CM

## 2017-10-25 DIAGNOSIS — I255 Ischemic cardiomyopathy: Secondary | ICD-10-CM | POA: Diagnosis not present

## 2017-10-25 DIAGNOSIS — Z9889 Other specified postprocedural states: Secondary | ICD-10-CM | POA: Insufficient documentation

## 2017-10-25 DIAGNOSIS — Z6836 Body mass index (BMI) 36.0-36.9, adult: Secondary | ICD-10-CM | POA: Diagnosis not present

## 2017-10-25 DIAGNOSIS — Z1211 Encounter for screening for malignant neoplasm of colon: Secondary | ICD-10-CM

## 2017-10-25 DIAGNOSIS — Z87891 Personal history of nicotine dependence: Secondary | ICD-10-CM | POA: Diagnosis not present

## 2017-10-25 DIAGNOSIS — M7989 Other specified soft tissue disorders: Secondary | ICD-10-CM | POA: Insufficient documentation

## 2017-10-25 DIAGNOSIS — Z833 Family history of diabetes mellitus: Secondary | ICD-10-CM | POA: Insufficient documentation

## 2017-10-25 DIAGNOSIS — Z760 Encounter for issue of repeat prescription: Secondary | ICD-10-CM | POA: Insufficient documentation

## 2017-10-25 DIAGNOSIS — Z7902 Long term (current) use of antithrombotics/antiplatelets: Secondary | ICD-10-CM | POA: Diagnosis not present

## 2017-10-25 DIAGNOSIS — Z91013 Allergy to seafood: Secondary | ICD-10-CM | POA: Diagnosis not present

## 2017-10-25 DIAGNOSIS — Z79899 Other long term (current) drug therapy: Secondary | ICD-10-CM | POA: Insufficient documentation

## 2017-10-25 DIAGNOSIS — I5042 Chronic combined systolic (congestive) and diastolic (congestive) heart failure: Secondary | ICD-10-CM

## 2017-10-25 DIAGNOSIS — Z7982 Long term (current) use of aspirin: Secondary | ICD-10-CM | POA: Diagnosis not present

## 2017-10-25 DIAGNOSIS — Z825 Family history of asthma and other chronic lower respiratory diseases: Secondary | ICD-10-CM | POA: Diagnosis not present

## 2017-10-25 DIAGNOSIS — Z8249 Family history of ischemic heart disease and other diseases of the circulatory system: Secondary | ICD-10-CM | POA: Insufficient documentation

## 2017-10-25 DIAGNOSIS — I2510 Atherosclerotic heart disease of native coronary artery without angina pectoris: Secondary | ICD-10-CM

## 2017-10-25 DIAGNOSIS — I130 Hypertensive heart and chronic kidney disease with heart failure and stage 1 through stage 4 chronic kidney disease, or unspecified chronic kidney disease: Secondary | ICD-10-CM | POA: Insufficient documentation

## 2017-10-25 DIAGNOSIS — E669 Obesity, unspecified: Secondary | ICD-10-CM | POA: Insufficient documentation

## 2017-10-25 MED ORDER — METOPROLOL TARTRATE 25 MG PO TABS: 25.0000 mg | ORAL_TABLET | Freq: Two times a day (BID) | ORAL | 3 refills | Status: DC

## 2017-10-25 MED ORDER — FUROSEMIDE 40 MG PO TABS: 40.0000 mg | ORAL_TABLET | Freq: Every day | ORAL | 3 refills | Status: DC

## 2017-10-25 MED ORDER — POTASSIUM CHLORIDE CRYS ER 20 MEQ PO TBCR: 40.0000 meq | EXTENDED_RELEASE_TABLET | Freq: Every day | ORAL | 3 refills | Status: DC

## 2017-10-25 MED ORDER — ATORVASTATIN CALCIUM 80 MG PO TABS: 80.0000 mg | ORAL_TABLET | Freq: Every day | ORAL | 3 refills | Status: DC

## 2017-10-25 MED ORDER — CLOPIDOGREL BISULFATE 75 MG PO TABS: 75.0000 mg | ORAL_TABLET | Freq: Every day | ORAL | 3 refills | Status: DC

## 2017-10-25 NOTE — Progress Notes (Signed)
Patient ID: Seth Keller, male    DOB: 11-10-1965  MRN: [id number]  CC: Medication Management; Medication Refill; and Chest Pain   Subjective: Seth Keller is a 52 y.o. male who presents for chronic ds management. His concerns today include:  Pt with hx of CAD s/p CABG x 4, HTN, HL, ICM but last EF 08/2016 improved to 50-55%, 2DD, CKD stage 3, obesity  HTN/ICM/CAD:  Checks BP 2 x a wk.  Gives range 180/70.  Reports compliance with medicines and took them already for today Limits salt Took meds already this morning.  Stable exertional CP unchanged from previous.  No use of SL in past few mths Swelling in LT leg unchanged Saw his cardiologist Dr. Angelena Form back in June.  Isosorbide increased to 60 mg daily.  CKD: saw nephrologist last wk. Told kidney function stable.  Had blood test done.  I have not received a note from them as yet.  HM:  Declines flu shot.  Due for colon CA screen No fhx of colon CA, no blood in stools.  Moving bowels okay.   Patient Active Problem List   Diagnosis Date Noted  . Chronic bilateral low back pain without sciatica 12/22/2015  . Obesity (BMI 30-39.9) 06/16/2015  . SOB (shortness of breath) 09/27/2014  . Left shoulder pain 09/08/2014  . CAD (coronary artery disease)   . Ischemic cardiomyopathy   . Chronic combined systolic and diastolic CHF (congestive heart failure) ([place])   . CKD (chronic kidney disease), stage III ([place])   . Tobacco abuse, in remission   . Constipation 01/11/2014  . S/P CABG x 4 01/01/2014  . Essential hypertension 05/08/2012  . Microhematuria 05/08/2012  . Proteinuria 05/08/2012     Current Outpatient Medications on File Prior to Visit  Medication Sig Dispense Refill  . amLODipine (NORVASC) 10 MG tablet Take 1 tablet (10 mg total) by mouth daily. 30 tablet 0  . aspirin EC 81 MG tablet Take 1 tablet (81 mg total) by mouth daily. 90 tablet 3  . Cholecalciferol (VITAMIN D) 2000 units tablet Take 1 tablet (2,000 Units total) by mouth  daily. 90 tablet 3  . isosorbide mononitrate (IMDUR) 60 MG 24 hr tablet Take 1 tablet (60 mg total) by mouth daily. 90 tablet 3  . lisinopril (PRINIVIL,ZESTRIL) 20 MG tablet TAKE 1 TABLET BY MOUTH ONCE DAILY 90 tablet 3  . nitroGLYCERIN (NITROSTAT) 0.4 MG SL tablet Place 1 tablet (0.4 mg total) under the tongue every 5 (five) minutes as needed for chest pain. 30 tablet 3   No current facility-administered medications on file prior to visit.     Allergies  Allergen Reactions  . Shellfish Allergy Anaphylaxis    Throat close up, swelling    Social History   Socioeconomic History  . Marital status: Single    Spouse name: Not on file  . Number of children: 0  . Years of education: 76  . Highest education level: Not on file  Occupational History  . 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 Financial resource strain: Not on file  . Food insecurity:    Worry: Not on file    Inability: Not on file  . Transportation needs:    Medical: Not on file    Non-medical: Not on file  Tobacco Use  . Smoking status: Former Smoker    Packs/day: 0.50    Years: 20.00    Pack years:  10.00    Types: Cigarettes    Last attempt to quit: 12/24/2013    Years since quitting: 3.8  . Smokeless tobacco: Never Used  Substance and Sexual Activity  . Alcohol use: No    Alcohol/week: 0.0 standard drinks    Comment: socially, 1-2 x year  . Drug use: No  . Sexual activity: Yes    Birth control/protection: None  Lifestyle  . Physical activity:    Days per week: Not on file    Minutes per session: Not on file  . Stress: Not on file  Relationships  . Social connections:    Talks on phone: Not on file    Gets together: Not on file    Attends religious service: Not on file    Active member of club or organization: Not on file    Attends meetings of clubs or organizations: Not on file    Relationship status: Not on file  . Intimate  partner violence:    Fear of current or ex partner: Not on file    Emotionally abused: Not on file    Physically abused: Not on file    Forced sexual activity: Not on file  Other Topics Concern  . Not on file  Social History Narrative   Lives with fiance.   From [place], originally.   Moved to [place] in 2012.     Family History  Problem Relation Age of Onset  . Heart disease Mother 56  . Hypertension Father 63  . Asthma Father   . Diabetes Paternal Grandfather   . Cancer Neg Hx     Past Surgical History:  Procedure Laterality Date  . CORONARY ARTERY BYPASS GRAFT N/A 01/01/2014   Procedure: CORONARY ARTERY BYPASS GRAFTING (CABG) times four using left internal mammary artery and right saphenous vein;  Surgeon: Gaye Pollack, MD;  Location: [facility];  Service: Open Heart Surgery;  Laterality: N/A;  . INTRAOPERATIVE TRANSESOPHAGEAL ECHOCARDIOGRAM N/A 01/01/2014   Procedure: INTRAOPERATIVE TRANSESOPHAGEAL ECHOCARDIOGRAM;  Surgeon: Gaye Pollack, MD;  Location: [facility];  Service: Open Heart Surgery;  Laterality: N/A;  . LEFT HEART CATHETERIZATION WITH CORONARY ANGIOGRAM N/A 12/29/2013   Procedure: LEFT HEART CATHETERIZATION WITH CORONARY ANGIOGRAM;  Surgeon: Sinclair Grooms, MD;  Location: [facility] CATH LAB;  Service: Cardiovascular;  Laterality: N/A;  . none      ROS: Review of Systems Negative except as stated above PHYSICAL EXAM: BP 126/77   Pulse 60   Temp 98.8 F (37.1 C) (Oral)   Resp 16   Wt 249 lb 3.2 oz (113 kg)   SpO2 97%   BMI 36.80 kg/m   Repeat 126/77 Physical Exam  General appearance - alert, well appearing, and in no distress Mental status - normal mood, behavior, speech, dress, motor activity, and thought processes Neck - supple, no significant adenopathy Chest - clear to auscultation, no wheezes, rales or rhonchi, symmetric air entry Heart - normal rate, regular rhythm, normal S1, S2, no murmurs, rubs, clicks or gallops Extremities -trace lower extremity  edema EKG reveals sinus bradycardia with left anterior fascicular block no acute changes.  Unchanged from EKG done in July  ASSESSMENT AND PLAN: 1. Essential hypertension Repeat blood pressure today is good.  No changes made to medications.  Continue low-salt diet. - metoprolol tartrate (LOPRESSOR) 25 MG tablet; Take 1 tablet (25 mg total) by mouth 2 (two) times daily.  Dispense: 180 tablet; Refill: 3 - clopidogrel (PLAVIX) 75 MG tablet; Take 1 tablet (  75 mg total) by mouth daily.  Dispense: 90 tablet; Refill: 3  2. Chronic combined systolic and diastolic CHF (congestive heart failure) (HCC) Continue low-salt diet and current medications - metoprolol tartrate (LOPRESSOR) 25 MG tablet; Take 1 tablet (25 mg total) by mouth 2 (two) times daily.  Dispense: 180 tablet; Refill: 3 - potassium chloride SA (KLOR-CON M20) 20 MEQ tablet; Take 2 tablets (40 mEq total) by mouth daily. MUST MAKE APPT FOR FURTHER REFILLS  Dispense: 180 tablet; Refill: 3 - furosemide (LASIX) 40 MG tablet; Take 1 tablet (40 mg total) by mouth daily.  Dispense: 90 tablet; Refill: 3  3. Coronary artery disease involving native coronary artery of native heart without angina pectoris - metoprolol tartrate (LOPRESSOR) 25 MG tablet; Take 1 tablet (25 mg total) by mouth 2 (two) times daily.  Dispense: 180 tablet; Refill: 3 - clopidogrel (PLAVIX) 75 MG tablet; Take 1 tablet (75 mg total) by mouth daily.  Dispense: 90 tablet; Refill: 3 - atorvastatin (LIPITOR) 80 MG tablet; Take 1 tablet (80 mg total) by mouth daily.  Dispense: 90 tablet; Refill: 3 - Lipid panel - Hepatic Function Panel  4. CKD (chronic kidney disease), stage III ([place]) Await note from nephrologist.  I will look over the lab studies that he had done there. - potassium chloride SA (KLOR-CON M20) 20 MEQ tablet; Take 2 tablets (40 mEq total) by mouth daily. MUST MAKE APPT FOR FURTHER REFILLS  Dispense: 180 tablet; Refill: 3 - furosemide (LASIX) 40 MG tablet; Take 1  tablet (40 mg total) by mouth daily.  Dispense: 90 tablet; Refill: 3 - clopidogrel (PLAVIX) 75 MG tablet; Take 1 tablet (75 mg total) by mouth daily.  Dispense: 90 tablet; Refill: 3  5. Ischemic cardiomyopathy  6. Colon cancer screening Patient agreeable to colon cancer screening with colonoscopy. - Ambulatory referral to Gastroenterology  Patient was given the opportunity to ask questions.  Patient verbalized understanding of the plan and was able to repeat key elements of the plan.   Orders Placed This Encounter  Procedures  . Lipid panel  . Hepatic Function Panel  . Ambulatory referral to Gastroenterology     Requested Prescriptions   Signed Prescriptions Disp Refills  . metoprolol tartrate (LOPRESSOR) 25 MG tablet 180 tablet 3    Sig: Take 1 tablet (25 mg total) by mouth 2 (two) times daily.  . potassium chloride SA (KLOR-CON M20) 20 MEQ tablet 180 tablet 3    Sig: Take 2 tablets (40 mEq total) by mouth daily. MUST MAKE APPT FOR FURTHER REFILLS  . furosemide (LASIX) 40 MG tablet 90 tablet 3    Sig: Take 1 tablet (40 mg total) by mouth daily.  . clopidogrel (PLAVIX) 75 MG tablet 90 tablet 3    Sig: Take 1 tablet (75 mg total) by mouth daily.  Marland Kitchen atorvastatin (LIPITOR) 80 MG tablet 90 tablet 3    Sig: Take 1 tablet (80 mg total) by mouth daily.    Return in about 4 months (around 02/24/2018).  Karle Plumber, MD, FACP

## 2017-10-26 LAB — LIPID PANEL
Chol/HDL Ratio: 4.7 ratio (ref 0.0–5.0)
Cholesterol, Total: 137 mg/dL (ref 100–199)
HDL: 29 mg/dL — ABNORMAL LOW (ref >39)
LDL Calculated: 89 mg/dL (ref 0–99)
TRIGLYCERIDES: 96 mg/dL (ref 0–149)
VLDL Cholesterol Cal: 19 mg/dL (ref 5–40)

## 2017-10-26 LAB — HEPATIC FUNCTION PANEL
ALT: 23 IU/L (ref 0–44)
AST: 17 IU/L (ref 0–40)
Albumin: 4.1 g/dL (ref 3.5–5.5)
Alkaline Phosphatase: 95 IU/L (ref 39–117)
Bilirubin Total: 0.4 mg/dL (ref 0.0–1.2)
Bilirubin, Direct: 0.1 mg/dL (ref 0.00–0.40)
TOTAL PROTEIN: 7.5 g/dL (ref 6.0–8.5)

## 2017-10-30 ENCOUNTER — Telehealth: Payer: Self-pay

## 2017-10-30 NOTE — Telephone Encounter (Signed)
Contacted pt to go over lab results pt is aware of results and doesn't have any questions or concerns 

## 2017-12-10 ENCOUNTER — Other Ambulatory Visit: Payer: Self-pay | Admitting: Internal Medicine

## 2017-12-10 DIAGNOSIS — I10 Essential (primary) hypertension: Secondary | ICD-10-CM

## 2017-12-18 ENCOUNTER — Telehealth: Payer: Self-pay

## 2017-12-18 NOTE — Telephone Encounter (Signed)
   [facility] Pre-operative Risk Assessment    Request for surgical clearance:  1. What type of surgery is being performed? Colonoscopy   2. When is this surgery scheduled? 02/03/2018   3. What type of clearance is required (medical clearance vs. Pharmacy clearance to hold med vs. Both)? Both  4. Are there any medications that need to be held prior to surgery and how long? Plavix   5. Practice name and name of physician performing surgery? [facility]/Dr. Alessandra Bevels  6. What is your office phone number [phone]    7.   What is your office fax number [phone]  8.   Anesthesia type (None, local, MAC, general) ? Propfol   Mady Haagensen 12/18/2017, 2:44 PM  _________________________________________________________________   (provider comments below)

## 2017-12-19 NOTE — Telephone Encounter (Signed)
   Primary Cardiologist: Lauree Chandler, MD  Chart reviewed as part of pre-operative protocol coverage. 52 yo male with history of CKD, CAD s/p 4V CABG November 2015, HTN, HLD, ischemic cardiomyopathy. Nuclear stress test in 2018 with evidence of scar but no large areas of ischemia.  His imdur increased to 60mg  for angina 07/29/17. Siscussed a cardiac cath but given his kidney disease,  prefered to hold off for now.   During my conversation he started the he continued to have dyspnea and chest pain with exercise. However, improved since last OV. Never required to take any SL nitro. DASI is 5 mets.   Dr. Angelena Form please advised regarding clearance and Plavix. Please forward your response to P [facility] PREOP.   Thank you  Leanor Kail, PA 12/19/2017, 1:16 PM

## 2017-12-20 NOTE — Telephone Encounter (Signed)
OK to proceed with the colonoscopy. OK to hold plavix for the procedure. This is a low risk procedure.   Seth Keller

## 2018-03-08 ENCOUNTER — Other Ambulatory Visit: Payer: Self-pay | Admitting: Internal Medicine

## 2018-03-08 DIAGNOSIS — I10 Essential (primary) hypertension: Secondary | ICD-10-CM

## 2018-04-10 ENCOUNTER — Other Ambulatory Visit: Payer: Self-pay | Admitting: Internal Medicine

## 2018-04-10 DIAGNOSIS — I10 Essential (primary) hypertension: Secondary | ICD-10-CM

## 2018-04-11 ENCOUNTER — Telehealth: Payer: Self-pay | Admitting: Internal Medicine

## 2018-04-11 NOTE — Telephone Encounter (Signed)
1) Medication(s) Requested (by name): Amlodipine  2) Pharmacy of Choice: [facility] [place] - [address]  3) Special Requests:   Approved medications will be sent to the pharmacy, we will reach out if there is an issue.  Requests made after 3pm may not be addressed until the following business day!  If a patient is unsure of the name of the medication(s) please note and ask patient to call back when they are able to provide all info, do not send to responsible party until all information is available!

## 2018-04-12 ENCOUNTER — Other Ambulatory Visit: Payer: Self-pay | Admitting: Internal Medicine

## 2018-04-12 DIAGNOSIS — I10 Essential (primary) hypertension: Secondary | ICD-10-CM

## 2018-04-14 ENCOUNTER — Telehealth: Payer: Self-pay | Admitting: Internal Medicine

## 2018-04-14 DIAGNOSIS — I10 Essential (primary) hypertension: Secondary | ICD-10-CM

## 2018-04-14 MED ORDER — AMLODIPINE BESYLATE 10 MG PO TABS: 10.0000 mg | ORAL_TABLET | Freq: Every day | ORAL | 0 refills | Status: DC

## 2018-04-14 NOTE — Telephone Encounter (Signed)
1) Medication(s) Requested (by name): Amlodipine        2) Pharmacy of Choice:  [facility] on [address]   *Patient called to check on the status of their refill request. Patient says they only have 1 pill left  Please follow up

## 2018-04-14 NOTE — Telephone Encounter (Signed)
Amlodipine rx sent earlier

## 2018-04-21 ENCOUNTER — Encounter: Payer: Self-pay | Admitting: Internal Medicine

## 2018-04-21 NOTE — Progress Notes (Signed)
Received note from [place] of [facility] gastroenterology.  Patient had colonoscopy done 03/11/2018.  Patient had a total of 5 polyps removed.  2 of them were 4 to 5 mm in the transverse colon and 3 of them were 3 to 8 mm in the sigmoid colon.  He also had diverticulosis in the sigmoid, descending and ascending colon

## 2018-05-12 ENCOUNTER — Telehealth: Payer: Self-pay | Admitting: Internal Medicine

## 2018-05-12 DIAGNOSIS — I10 Essential (primary) hypertension: Secondary | ICD-10-CM

## 2018-05-12 MED ORDER — AMLODIPINE BESYLATE 10 MG PO TABS: 10.0000 mg | ORAL_TABLET | Freq: Every day | ORAL | 2 refills | Status: DC

## 2018-05-12 NOTE — Telephone Encounter (Signed)
1) Medication(s) Requested (by name):amLODipine (NORVASC) 10 MG tablet    2) Pharmacy of Choice: [facility]   3) Special Requests:   Approved medications will be sent to the pharmacy, we will reach out if there is an issue.  Requests made after 3pm may not be addressed until the following business day!  If a patient is unsure of the name of the medication(s) please note and ask patient to call back when they are able to provide all info, do not send to responsible party until all information is available!

## 2018-05-19 ENCOUNTER — Telehealth: Payer: Self-pay | Admitting: Internal Medicine

## 2018-05-19 NOTE — Telephone Encounter (Signed)
1) Medication(s) Requested (by name): Atorvastatin Lisinopril Amlodipine Metoprolol imdur Furosemide plavix 2) Pharmacy of Choice: [facility] on [address] 3) Special Requests:   Approved medications will be sent to the pharmacy, we will reach out if there is an issue.  Requests made after 3pm may not be addressed until the following business day!  If a patient is unsure of the name of the medication(s) please note and ask patient to call back when they are able to provide all info, do not send to responsible party until all information is available!

## 2018-05-20 ENCOUNTER — Ambulatory Visit: Payer: Medicaid Other | Admitting: Nurse Practitioner

## 2018-05-20 NOTE — Telephone Encounter (Signed)
This patient has plenty of refills on all these meds. He needs to contact his pharmacy.

## 2018-06-04 ENCOUNTER — Encounter: Payer: Self-pay | Admitting: Internal Medicine

## 2018-06-04 DIAGNOSIS — D631 Anemia in chronic kidney disease: Secondary | ICD-10-CM | POA: Insufficient documentation

## 2018-06-04 DIAGNOSIS — N189 Chronic kidney disease, unspecified: Secondary | ICD-10-CM

## 2018-06-04 DIAGNOSIS — N2581 Secondary hyperparathyroidism of renal origin: Secondary | ICD-10-CM | POA: Insufficient documentation

## 2018-06-17 ENCOUNTER — Ambulatory Visit: Payer: Medicaid Other | Attending: Nurse Practitioner | Admitting: Nurse Practitioner

## 2018-06-17 ENCOUNTER — Encounter: Payer: Self-pay | Admitting: Nurse Practitioner

## 2018-06-17 ENCOUNTER — Other Ambulatory Visit: Payer: Self-pay

## 2018-06-17 DIAGNOSIS — Z87891 Personal history of nicotine dependence: Secondary | ICD-10-CM | POA: Diagnosis not present

## 2018-06-17 DIAGNOSIS — I2510 Atherosclerotic heart disease of native coronary artery without angina pectoris: Secondary | ICD-10-CM | POA: Diagnosis not present

## 2018-06-17 DIAGNOSIS — Z7901 Long term (current) use of anticoagulants: Secondary | ICD-10-CM | POA: Diagnosis not present

## 2018-06-17 DIAGNOSIS — I5042 Chronic combined systolic (congestive) and diastolic (congestive) heart failure: Secondary | ICD-10-CM | POA: Diagnosis present

## 2018-06-17 DIAGNOSIS — Z7982 Long term (current) use of aspirin: Secondary | ICD-10-CM | POA: Diagnosis not present

## 2018-06-17 DIAGNOSIS — I4891 Unspecified atrial fibrillation: Secondary | ICD-10-CM | POA: Diagnosis not present

## 2018-06-17 DIAGNOSIS — N183 Chronic kidney disease, stage 3 unspecified: Secondary | ICD-10-CM

## 2018-06-17 DIAGNOSIS — I252 Old myocardial infarction: Secondary | ICD-10-CM | POA: Diagnosis not present

## 2018-06-17 DIAGNOSIS — I255 Ischemic cardiomyopathy: Secondary | ICD-10-CM | POA: Diagnosis not present

## 2018-06-17 DIAGNOSIS — I130 Hypertensive heart and chronic kidney disease with heart failure and stage 1 through stage 4 chronic kidney disease, or unspecified chronic kidney disease: Secondary | ICD-10-CM | POA: Insufficient documentation

## 2018-06-17 DIAGNOSIS — Z8249 Family history of ischemic heart disease and other diseases of the circulatory system: Secondary | ICD-10-CM | POA: Insufficient documentation

## 2018-06-17 DIAGNOSIS — I129 Hypertensive chronic kidney disease with stage 1 through stage 4 chronic kidney disease, or unspecified chronic kidney disease: Secondary | ICD-10-CM | POA: Diagnosis not present

## 2018-06-17 DIAGNOSIS — Z951 Presence of aortocoronary bypass graft: Secondary | ICD-10-CM | POA: Diagnosis not present

## 2018-06-17 DIAGNOSIS — Z79899 Other long term (current) drug therapy: Secondary | ICD-10-CM | POA: Insufficient documentation

## 2018-06-17 DIAGNOSIS — I25118 Atherosclerotic heart disease of native coronary artery with other forms of angina pectoris: Secondary | ICD-10-CM | POA: Diagnosis not present

## 2018-06-17 DIAGNOSIS — I10 Essential (primary) hypertension: Secondary | ICD-10-CM

## 2018-06-17 DIAGNOSIS — Z7902 Long term (current) use of antithrombotics/antiplatelets: Secondary | ICD-10-CM | POA: Diagnosis not present

## 2018-06-17 MED ORDER — ATORVASTATIN CALCIUM 80 MG PO TABS: 80.0000 mg | ORAL_TABLET | Freq: Every day | ORAL | 0 refills | Status: DC

## 2018-06-17 MED ORDER — AMLODIPINE BESYLATE 10 MG PO TABS: 10.0000 mg | ORAL_TABLET | Freq: Every day | ORAL | 0 refills | Status: DC

## 2018-06-17 MED ORDER — LISINOPRIL 20 MG PO TABS: 20.0000 mg | ORAL_TABLET | Freq: Every day | ORAL | 0 refills | Status: DC

## 2018-06-17 MED ORDER — ASPIRIN EC 81 MG PO TBEC: 81.0000 mg | DELAYED_RELEASE_TABLET | Freq: Every day | ORAL | 3 refills | Status: DC

## 2018-06-17 MED ORDER — ISOSORBIDE MONONITRATE ER 60 MG PO TB24: 60.0000 mg | ORAL_TABLET | Freq: Every day | ORAL | 3 refills | Status: DC

## 2018-06-17 MED ORDER — CLOPIDOGREL BISULFATE 75 MG PO TABS: 75.0000 mg | ORAL_TABLET | Freq: Every day | ORAL | 0 refills | Status: DC

## 2018-06-17 MED ORDER — POTASSIUM CHLORIDE CRYS ER 20 MEQ PO TBCR: 40.0000 meq | EXTENDED_RELEASE_TABLET | Freq: Every day | ORAL | 3 refills | Status: DC

## 2018-06-17 MED ORDER — METOPROLOL TARTRATE 25 MG PO TABS: 25.0000 mg | ORAL_TABLET | Freq: Two times a day (BID) | ORAL | 0 refills | Status: DC

## 2018-06-17 MED ORDER — FUROSEMIDE 40 MG PO TABS: 40.0000 mg | ORAL_TABLET | Freq: Every day | ORAL | 0 refills | Status: DC

## 2018-06-17 MED ORDER — NITROGLYCERIN 0.4 MG SL SUBL: 0.4000 mg | SUBLINGUAL_TABLET | SUBLINGUAL | 3 refills | Status: DC | PRN

## 2018-06-17 NOTE — Progress Notes (Signed)
Virtual Visit via Telephone Note Due to national recommendations of social distancing due to [place] 19, telehealth visit is felt to be most appropriate for this patient at this time.  I discussed the limitations, risks, security and privacy concerns of performing an evaluation and management service by telephone and the availability of in person appointments. I also discussed with the patient that there may be a patient responsible charge related to this service. The patient expressed understanding and agreed to proceed.    I connected with Scorpio Prouty on 06/17/18  at   2:10 PM EDT  EDT by telephone and verified that I am speaking with the correct person using two identifiers.   Consent I discussed the limitations, risks, security and privacy concerns of performing an evaluation and management service by telephone and the availability of in person appointments. I also discussed with the patient that there may be a patient responsible charge related to this service. The patient expressed understanding and agreed to proceed.   Location of Patient: Private Residence   Location of Provider: [place] and [employer] Office    Persons participating in Telemedicine visit: Hameed Kolar FNP-BC Lathrop Karn    History of Present Illness: Telemedicine visit for: Follow up Has a history of CAD s/p CABG x 4, HTN, HL, ICM but last EF 08/2016 improved to 50-55%, 2DD, CKD stage 3, obesity. Has not seen cardiology or his PCP since last year. Will need to be referred back to Cardiology.   CKD STAGE 3 He sees nephrologist next month 06-30-2018 and states he will have labwork drawn at that time. Declines any blood work to be drawn today. I have instructed him to make sure his nephrologist submits his lab results to this office.    Essential Hypertension Has not been monitoring his blood pressure at home. Endorses medication compliance taking amlodipine 10 mg daily, lisinopril 20mg ,  metoprolol 25 mg BID. Takes Nitrostat prn,  furosemide 40mg  daily and Imdur 60mg  daily for ICM. Currently denies chest pain, shortness of breath, palpitations, lightheadedness, dizziness, headaches or BLE edema.  BP Readings from Last 3 Encounters:  10/25/17 126/77  07/29/17 138/72  04/02/17 (!) 146/86    Past Medical History:  Diagnosis Date  . Atrial fibrillation with RVR ([place])    a. 12/2013 post-op CABG ->on amio.  Marland Kitchen CAD (coronary artery disease)    a. 12/2013 NSTEMI/Cath: 3VD;  b. 12/2013 CABG x 4: LIMA->LAD, VG->RI, VG->OM1->LPDA.  Marland Kitchen Chronic combined systolic and diastolic CHF (congestive heart failure) ([place])    a. 12/2013 Echo: EF 45%, Gr 2 DD.  Marland Kitchen CKD (chronic kidney disease), stage III ([place])   . Hypertension    a. Dx ~ 2000.  . Ischemic cardiomyopathy    a. 12/2013 Echo: EF 45%, Gr 2 DD, basal-mid inflat and inf AK, mild MR.  . Tobacco abuse     Past Surgical History:  Procedure Laterality Date  . CORONARY ARTERY BYPASS GRAFT N/A 01/01/2014   Procedure: CORONARY ARTERY BYPASS GRAFTING (CABG) times four using left internal mammary artery and right saphenous vein;  Surgeon: Gaye Pollack, MD;  Location: [facility];  Service: Open Heart Surgery;  Laterality: N/A;  . INTRAOPERATIVE TRANSESOPHAGEAL ECHOCARDIOGRAM N/A 01/01/2014   Procedure: INTRAOPERATIVE TRANSESOPHAGEAL ECHOCARDIOGRAM;  Surgeon: Gaye Pollack, MD;  Location: [facility];  Service: Open Heart Surgery;  Laterality: N/A;  . LEFT HEART CATHETERIZATION WITH CORONARY ANGIOGRAM N/A 12/29/2013   Procedure: LEFT HEART CATHETERIZATION WITH CORONARY ANGIOGRAM;  Surgeon: Mallie Mussel  Carlye Grippe, MD;  Location[facility] C[facility] LAB;  Service: Cardiovascular;  Laterality: N/A;  . none      Family History  Problem Relation Age of Onset  . Heart disease Mother 98  . Hypertension Father 46  . Asthma Father   . Diabetes Paternal Grandfather   . Cancer Neg Hx     Social History   Socioeconomic History  . Marital status: Single    Spouse name:  Not on file  . Number of children: 0  . Years of education: 41  . Highest education level: Not on file  Occupational History  . 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 Financial resource strain: Not on file  . Food insecurity:    Worry: Not on file    Inability: Not on file  . Transportation needs:    Medical: Not on file    Non-medical: Not on file  Tobacco Use  . Smoking status: Former Smoker    Packs/day: 0.50    Years: 20.00    Pack years: 10.00    Types: Cigarettes    Last attempt to quit: 12/24/2013    Years since quitting: 4.4  . Smokeless tobacco: Never Used  Substance and Sexual Activity  . Alcohol use: No    Alcohol/week: 0.0 standard drinks    Comment: socially, 1-2 x year  . Drug use: No  . Sexual activity: Yes    Birth control/protection: None  Lifestyle  . Physical activity:    Days per week: Not on file    Minutes per session: Not on file  . Stress: Not on file  Relationships  . Social connections:    Talks on phone: Not on file    Gets together: Not on file    Attends religious service: Not on file    Active member of club or organization: Not on file    Attends meetings of clubs or organizations: Not on file    Relationship status: Not on file  Other Topics Concern  . Not on file  Social History Narrative   Lives with fiance.   From [place], originally.   Moved to [place] in 2012.      Observations/Objective: Awake, alert and oriented x 3   Review of Systems  Constitutional: Negative for fever, malaise/fatigue and weight loss.  HENT: Negative.  Negative for nosebleeds.   Eyes: Negative.  Negative for blurred vision, double vision and photophobia.  Respiratory: Negative.  Negative for cough and shortness of breath.   Cardiovascular: Negative.  Negative for chest pain (Denies any recent use of SL NTG) and palpitations.  Gastrointestinal: Negative.  Negative for  heartburn, nausea and vomiting.  Genitourinary: Negative.  Negative for flank pain and hematuria.  Musculoskeletal: Positive for back pain and joint pain (left shoulder). Negative for myalgias.  Neurological: Negative.  Negative for dizziness, focal weakness, seizures and headaches.  Psychiatric/Behavioral: Negative for substance abuse and suicidal ideas.    Assessment and Plan:  Diagnoses and all orders for this visit:  Essential hypertension -     amLODipine (NORVASC) 10 MG tablet; Take 1 tablet (10 mg total) by mouth daily. -     aspirin EC 81 MG tablet; Take 1 tablet (81 mg total) by mouth daily. -     clopidogrel (PLAVIX) 75 MG tablet; Take 1 tablet (75 mg total) by mouth daily. -     lisinopril (ZESTRIL) 20 MG tablet;  Take 1 tablet (20 mg total) by mouth daily. -     metoprolol tartrate (LOPRESSOR) 25 MG tablet; Take 1 tablet (25 mg total) by mouth 2 (two) times daily. Continue all antihypertensives as prescribed.  Remember to bring in your blood pressure log with you for your follow up appointment.  DASH/Mediterranean Diets are healthier choices for HTN.    Coronary artery disease involving native coronary artery of native heart without angina pectoris Stable. Denies angina.  -     aspirin EC 81 MG tablet; Take 1 tablet (81 mg total) by mouth daily. -     atorvastatin (LIPITOR) 80 MG tablet; Take 1 tablet (80 mg total) by mouth daily. -     clopidogrel (PLAVIX) 75 MG tablet; Take 1 tablet (75 mg total) by mouth daily. -     isosorbide mononitrate (IMDUR) 60 MG 24 hr tablet; Take 1 tablet (60 mg total) by mouth daily. -     metoprolol tartrate (LOPRESSOR) 25 MG tablet; Take 1 tablet (25 mg total) by mouth 2 (two) times daily. -     nitroGLYCERIN (NITROSTAT) 0.4 MG SL tablet; Place 1 tablet (0.4 mg total) under the tongue every 5 (five) minutes as needed for chest pain. -     Ambulatory referral to Cardiology  Ischemic cardiomyopathy -     aspirin EC 81 MG tablet; Take 1 tablet  (81 mg total) by mouth daily. -     isosorbide mononitrate (IMDUR) 60 MG 24 hr tablet; Take 1 tablet (60 mg total) by mouth daily. -     Ambulatory referral to Cardiology  CKD (chronic kidney disease), stage III (HCC) -     aspirin EC 81 MG tablet; Take 1 tablet (81 mg total) by mouth daily. -     furosemide (LASIX) 40 MG tablet; Take 1 tablet (40 mg total) by mouth daily. -     potassium chloride SA (KLOR-CON M20) 20 MEQ tablet; Take 2 tablets (40 mEq total) by mouth daily for 30 days.  Chronic combined systolic and diastolic CHF (congestive heart failure) (HCC) -     furosemide (LASIX) 40 MG tablet; Take 1 tablet (40 mg total) by mouth daily. -     metoprolol tartrate (LOPRESSOR) 25 MG tablet; Take 1 tablet (25 mg total) by mouth 2 (two) times daily. -     potassium chloride SA (KLOR-CON M20) 20 MEQ tablet; Take 2 tablets (40 mEq total) by mouth daily for 30 days. -     Ambulatory referral to Cardiology  Coronary artery disease of native artery of native heart with stable angina pectoris [facility]) -     Ambulatory referral to Cardiology     Follow Up Instructions Return if symptoms worsen or fail to improve.     I discussed the assessment and treatment plan with the patient. The patient was provided an opportunity to ask questions and all were answered. The patient agreed with the plan and demonstrated an understanding of the instructions.   The patient was advised to call back or seek an in-person evaluation if the symptoms worsen or if the condition fails to improve as anticipated.  I provided 26 minutes of non-face-to-face time during this encounter including median intraservice time, reviewing previous notes, labs, imaging, medications and explaining diagnosis and management.  Gildardo Pounds, FNP-BC

## 2018-06-20 ENCOUNTER — Encounter: Payer: Self-pay | Admitting: Nurse Practitioner

## 2018-08-04 ENCOUNTER — Other Ambulatory Visit: Payer: Self-pay | Admitting: Internal Medicine

## 2018-08-04 ENCOUNTER — Telehealth: Payer: Self-pay | Admitting: Internal Medicine

## 2018-08-04 DIAGNOSIS — I5042 Chronic combined systolic (congestive) and diastolic (congestive) heart failure: Secondary | ICD-10-CM

## 2018-08-04 DIAGNOSIS — N183 Chronic kidney disease, stage 3 unspecified: Secondary | ICD-10-CM

## 2018-08-04 MED ORDER — POTASSIUM CHLORIDE CRYS ER 20 MEQ PO TBCR: 40.0000 meq | EXTENDED_RELEASE_TABLET | Freq: Every day | ORAL | 0 refills | Status: DC

## 2018-08-04 NOTE — Telephone Encounter (Signed)
1) Medication(s) Requested (by name): potassium chloride  Patient states he was expecting to have a 90 day supply due to it being more cost efficient vs having 30 day supplies. Please follow up.

## 2018-08-06 NOTE — Telephone Encounter (Signed)
Contacted pt and went over Dr. Wynetta Emery message pt is schedule for blood work on 6/24 at [facility]. Pt doesn't have any questions or concerns

## 2018-08-13 ENCOUNTER — Other Ambulatory Visit: Payer: Self-pay

## 2018-08-13 ENCOUNTER — Ambulatory Visit: Payer: Medicaid Other | Attending: Internal Medicine

## 2018-08-13 DIAGNOSIS — N183 Chronic kidney disease, stage 3 unspecified: Secondary | ICD-10-CM

## 2018-08-14 ENCOUNTER — Encounter: Payer: Self-pay | Admitting: Internal Medicine

## 2018-08-14 ENCOUNTER — Telehealth: Payer: Self-pay | Admitting: *Deleted

## 2018-08-14 DIAGNOSIS — K635 Polyp of colon: Secondary | ICD-10-CM | POA: Insufficient documentation

## 2018-08-14 DIAGNOSIS — I5042 Chronic combined systolic (congestive) and diastolic (congestive) heart failure: Secondary | ICD-10-CM

## 2018-08-14 DIAGNOSIS — N183 Chronic kidney disease, stage 3 unspecified: Secondary | ICD-10-CM

## 2018-08-14 LAB — BASIC METABOLIC PANEL
BUN/Creatinine Ratio: 9 (ref 9–20)
BUN: 22 mg/dL (ref 6–24)
CO2: 21 mmol/L (ref 20–29)
Calcium: 9.7 mg/dL (ref 8.7–10.2)
Chloride: 104 mmol/L (ref 96–106)
Creatinine, Ser: 2.41 mg/dL — ABNORMAL HIGH (ref 0.76–1.27)
GFR calc Af Amer: 34 mL/min/{1.73_m2} — ABNORMAL LOW (ref >59)
GFR calc non Af Amer: 30 mL/min/{1.73_m2} — ABNORMAL LOW (ref >59)
Glucose: 128 mg/dL — ABNORMAL HIGH (ref 65–99)
Potassium: 4.1 mmol/L (ref 3.5–5.2)
Sodium: 143 mmol/L (ref 134–144)

## 2018-08-14 MED ORDER — FUROSEMIDE 40 MG PO TABS: 40.0000 mg | ORAL_TABLET | Freq: Every day | ORAL | 1 refills | Status: DC

## 2018-08-14 MED ORDER — POTASSIUM CHLORIDE CRYS ER 20 MEQ PO TBCR: 40.0000 meq | EXTENDED_RELEASE_TABLET | Freq: Every day | ORAL | 1 refills | Status: DC

## 2018-08-14 NOTE — Telephone Encounter (Signed)
-----   Message from Ladell Pier, MD sent at 08/14/2018 10:40 AM EDT ----- Let patient know that his kidney function is still not 100% and has declined more compared to 1 year ago.  Continue to follow up with his kidney specialist.  Needs follow up with me next month.  Please ask front dest to schedule.

## 2018-08-14 NOTE — Telephone Encounter (Signed)
Patient verified DOB Patient is aware of kidney function worsening from a year ago and to continue with kidney specialist. Patient is also scheduled for 09/16/2018 at 1:30 for FU with PCP.

## 2018-08-14 NOTE — Telephone Encounter (Signed)
Patient is aware. Thank you PCP.

## 2018-08-20 ENCOUNTER — Telehealth: Payer: Self-pay | Admitting: *Deleted

## 2018-08-20 NOTE — Telephone Encounter (Signed)
   [facility] Pre-operative Risk Assessment    Request for surgical clearance:  1. What type of surgery is being performed? Colonoscopy  2. When is this surgery scheduled? 09/17/18  3. What type of clearance is required (medical clearance vs. Pharmacy clearance to hold med vs. Both)? Both  4. Are there any medications that need to be held prior to surgery and how long? Clopidogrel   5. Practice name and name of physician performing surgery? [facility]/Dr Brahmbhatt   6. What is your office phone number [phone]    7.   What is your office fax number [phone]  8.   Anesthesia type (None, local, MAC, general) ? Propofol   Fumi Guadron L 08/20/2018, 3:58 PM  _________________________________________________________________   (provider comments below)

## 2018-08-21 NOTE — Telephone Encounter (Signed)
Unable to leave message- mailbox full.  Kerin Ransom PA-C 08/21/2018 4:16 PM

## 2018-08-21 NOTE — Telephone Encounter (Signed)
   Primary Cardiologist: Lauree Chandler, MD  Chart reviewed and patient contacted today by phone as part of pre-operative protocol coverage. Given past medical history and time since last visit, based on ACC/AHA guidelines, Seth Keller would be at acceptable risk for the planned procedure without further cardiovascular testing.   OK to hold clopidogrel 5 days pre op and if needed aspirin 3-5 days pre op.   I will route this recommendation to the requesting party via Epic fax function and remove from pre-op pool.  Please call with questions.  Kerin Ransom, PA-C 08/21/2018, 4:38 PM

## 2018-09-10 ENCOUNTER — Other Ambulatory Visit: Payer: Self-pay | Admitting: Nurse Practitioner

## 2018-09-10 DIAGNOSIS — N183 Chronic kidney disease, stage 3 unspecified: Secondary | ICD-10-CM

## 2018-09-10 DIAGNOSIS — I5042 Chronic combined systolic (congestive) and diastolic (congestive) heart failure: Secondary | ICD-10-CM

## 2018-09-16 ENCOUNTER — Ambulatory Visit: Payer: Medicaid Other | Admitting: Internal Medicine

## 2018-09-19 ENCOUNTER — Telehealth: Payer: Self-pay | Admitting: Radiology

## 2018-09-19 ENCOUNTER — Telehealth: Payer: Self-pay | Admitting: Cardiovascular Disease

## 2018-09-19 DIAGNOSIS — R001 Bradycardia, unspecified: Secondary | ICD-10-CM

## 2018-09-19 NOTE — Telephone Encounter (Signed)
Routed to NL triage in error rerouted to [address] triage

## 2018-09-19 NOTE — Telephone Encounter (Signed)
Pt aware and order entered ./cy °

## 2018-09-19 NOTE — Telephone Encounter (Signed)
New message   STAT if HR is under 50 or over 120 (normal HR is 60-100 beats per minute)  1) What is your heart rate? 20, patient states that his hr dropped during colonoscopy and was unable to finish the colonoscopy  2) Do you have a log of your heart rate readings (document readings)? No   3) Do you have any other symptoms? No

## 2018-09-19 NOTE — Telephone Encounter (Signed)
Can we set him up for a Zio patch monitor for 2 days to follow his heart rate? Thanks, chris

## 2018-09-19 NOTE — Telephone Encounter (Signed)
Enrolled patient for a 3 day Zio monitor to be mailed. Brief instructions were gone over and patient knows to expect the monitor to arrive in 3-4 days.

## 2018-09-19 NOTE — Telephone Encounter (Signed)
Pt reports that they did not complete colonoscopy yesterday d/t low HR.  States he was told it went into the 20s. Pt reports no previous issues w/ low HRs.  Reports currently he is feeling fine, no complaints, HR normal. Pt aware I will send this to Dr. Angelena Form and his RN for review/follow up.  Pt aware someone will call him once reviewed by [facility].

## 2018-09-22 ENCOUNTER — Other Ambulatory Visit (INDEPENDENT_AMBULATORY_CARE_PROVIDER_SITE_OTHER): Payer: Medicaid Other

## 2018-09-22 DIAGNOSIS — R001 Bradycardia, unspecified: Secondary | ICD-10-CM | POA: Diagnosis not present

## 2018-09-25 ENCOUNTER — Other Ambulatory Visit: Payer: Self-pay

## 2018-09-25 ENCOUNTER — Ambulatory Visit: Payer: Medicaid Other | Attending: Internal Medicine | Admitting: Internal Medicine

## 2018-09-25 DIAGNOSIS — I10 Essential (primary) hypertension: Secondary | ICD-10-CM | POA: Diagnosis not present

## 2018-09-25 DIAGNOSIS — R001 Bradycardia, unspecified: Secondary | ICD-10-CM | POA: Diagnosis not present

## 2018-09-25 DIAGNOSIS — N183 Chronic kidney disease, stage 3 unspecified: Secondary | ICD-10-CM

## 2018-09-25 DIAGNOSIS — I5042 Chronic combined systolic (congestive) and diastolic (congestive) heart failure: Secondary | ICD-10-CM

## 2018-09-25 DIAGNOSIS — K635 Polyp of colon: Secondary | ICD-10-CM

## 2018-09-25 DIAGNOSIS — I2510 Atherosclerotic heart disease of native coronary artery without angina pectoris: Secondary | ICD-10-CM

## 2018-09-25 MED ORDER — LISINOPRIL 20 MG PO TABS: 20.0000 mg | ORAL_TABLET | Freq: Every day | ORAL | 1 refills | Status: DC

## 2018-09-25 MED ORDER — AMLODIPINE BESYLATE 10 MG PO TABS: 10.0000 mg | ORAL_TABLET | Freq: Every day | ORAL | 1 refills | Status: DC

## 2018-09-25 MED ORDER — BLOOD PRESSURE MONITOR DEVI: 0 refills | Status: AC

## 2018-09-25 MED ORDER — METOPROLOL TARTRATE 25 MG PO TABS: 25.0000 mg | ORAL_TABLET | Freq: Two times a day (BID) | ORAL | 1 refills | Status: DC

## 2018-09-25 MED ORDER — ATORVASTATIN CALCIUM 80 MG PO TABS: 80.0000 mg | ORAL_TABLET | Freq: Every day | ORAL | 1 refills | Status: DC

## 2018-09-25 MED ORDER — CLOPIDOGREL BISULFATE 75 MG PO TABS: 75.0000 mg | ORAL_TABLET | Freq: Every day | ORAL | 1 refills | Status: DC

## 2018-09-25 NOTE — Progress Notes (Signed)
Virtual Visit via Telephone Note Due to current restrictions/limitations of in-office visits due to the COVID-19 pandemic, this scheduled clinical appointment was converted to a telehealth visit  I connected with Seth Keller on 09/25/18 at 4:20 p.m by telephone and verified that I am speaking with the correct person using two identifiers. I am in my office.  The patient is at home.  Only the patient and myself participated in this encounter.  I discussed the limitations, risks, security and privacy concerns of performing an evaluation and management service by telephone and the availability of in person appointments. I also discussed with the patient that there may be a patient responsible charge related to this service. The patient expressed understanding and agreed to proceed.  History of Present Illness: Pt with hx of CAD s/p CABG x 4, HTN, HL, ICM but last EF 08/2016 improved to 50-55%, 2DD, CKD stage 3, obesity.  I last saw patient 10/2017.  Purpose of today's visit was chronic disease management.  Had c-scope last wk. this was done as a repeat as the colonoscopy he had done earlier this year was with inadequate prep and several polyps were found and removed.  Procedure was aborted because HR dropped to 20s.  His cardiologist Dr. Angelena Form was notified.  Patient for a heart monitor for 2 days and just turned it in.  He awaits the results from his cardiologist. -He denies any dizzy spells.  CAD/HTN/ICM: Some SOB a lot of exertion,  No PND/orthopnea Last used SL Nitor 3-4 wks ago.  No CP at rest No device to check BP limits salt in foods Reports compliance with medications including atorvastatin, lisinopril, metoprolol, furosemide, isosorbide, aspirin and Plavix  CKD: saw special May 11th Dr. Justin Mend.  Reports being told that his kidney function is no worse or better.  No changes made in medications.  He is not on any NSAIDs.  Outpatient Encounter Medications as of 09/25/2018  Medication Sig  .  amLODipine (NORVASC) 10 MG tablet Take 1 tablet (10 mg total) by mouth daily.  Marland Kitchen aspirin EC 81 MG tablet Take 1 tablet (81 mg total) by mouth daily.  Marland Kitchen atorvastatin (LIPITOR) 80 MG tablet Take 1 tablet (80 mg total) by mouth daily.  . Cholecalciferol (VITAMIN D) 2000 units tablet Take 1 tablet (2,000 Units total) by mouth daily.  . clopidogrel (PLAVIX) 75 MG tablet Take 1 tablet (75 mg total) by mouth daily.  . furosemide (LASIX) 40 MG tablet Take 1 tablet (40 mg total) by mouth daily.  . isosorbide mononitrate (IMDUR) 60 MG 24 hr tablet Take 1 tablet (60 mg total) by mouth daily.  Marland Kitchen lisinopril (ZESTRIL) 20 MG tablet Take 1 tablet (20 mg total) by mouth daily.  . metoprolol tartrate (LOPRESSOR) 25 MG tablet Take 1 tablet (25 mg total) by mouth 2 (two) times daily.  . nitroGLYCERIN (NITROSTAT) 0.4 MG SL tablet Place 1 tablet (0.4 mg total) under the tongue every 5 (five) minutes as needed for chest pain.  . potassium chloride SA (KLOR-CON M20) 20 MEQ tablet Take 2 tablets (40 mEq total) by mouth daily.   No facility-administered encounter medications on file as of 09/25/2018.       Observations/Objective:   Chemistry      Component Value Date/Time   NA 143 08/13/2018 0858   K 4.1 08/13/2018 0858   CL 104 08/13/2018 0858   CO2 21 08/13/2018 0858   BUN 22 08/13/2018 0858   CREATININE 2.41 (H) 08/13/2018 0858   CREATININE  1.88 (H) 06/16/2015 0959      Component Value Date/Time   CALCIUM 9.7 08/13/2018 0858   ALKPHOS 95 10/25/2017 1040   AST 17 10/25/2017 1040   ALT 23 10/25/2017 1040   BILITOT 0.4 10/25/2017 1040     Lab Results  Component Value Date   WBC 8.0 08/16/2014   HGB 13.7 08/16/2014   HCT 40.9 08/16/2014   MCV 82.3 08/16/2014   PLT 252 08/16/2014     Assessment and Plan: 1. Essential hypertension Level of control unknown.  We will have him see the clinical pharmacist in 1 to 2 weeks for check of blood pressure.  Continue current medications and low-salt diet.   Prescription sent to his pharmacy for blood pressure monitoring device.  Advised to check the blood pressure at least twice a week with goal being 130/80 or lower - amLODipine (NORVASC) 10 MG tablet; Take 1 tablet (10 mg total) by mouth daily.  Dispense: 90 tablet; Refill: 1 - metoprolol tartrate (LOPRESSOR) 25 MG tablet; Take 1 tablet (25 mg total) by mouth 2 (two) times daily.  Dispense: 180 tablet; Refill: 1 - lisinopril (ZESTRIL) 20 MG tablet; Take 1 tablet (20 mg total) by mouth daily.  Dispense: 90 tablet; Refill: 1 - clopidogrel (PLAVIX) 75 MG tablet; Take 1 tablet (75 mg total) by mouth daily.  Dispense: 90 tablet; Refill: 1 - Comprehensive metabolic panel; Future - Lipid panel; Future - CBC; Future  2. Chronic combined systolic and diastolic CHF (congestive heart failure) (HCC) Stable - metoprolol tartrate (LOPRESSOR) 25 MG tablet; Take 1 tablet (25 mg total) by mouth 2 (two) times daily.  Dispense: 180 tablet; Refill: 1  3. Bradycardia Just turned in heart monitor.  Await results from his cardiologist  4. Coronary artery disease involving native coronary artery of native heart without angina pectoris Followed by cardiology - metoprolol tartrate (LOPRESSOR) 25 MG tablet; Take 1 tablet (25 mg total) by mouth 2 (two) times daily.  Dispense: 180 tablet; Refill: 1 - clopidogrel (PLAVIX) 75 MG tablet; Take 1 tablet (75 mg total) by mouth daily.  Dispense: 90 tablet; Refill: 1 - atorvastatin (LIPITOR) 80 MG tablet; Take 1 tablet (80 mg total) by mouth daily.  Dispense: 90 tablet; Refill: 1  5. Polyp of colon, unspecified part of colon, unspecified type   6. CKD (chronic kidney disease), stage III ([place]) Discussed the importance of good blood pressure control   Follow Up Instructions: 2 mth   I discussed the assessment and treatment plan with the patient. The patient was provided an opportunity to ask questions and all were answered. The patient agreed with the plan and demonstrated  an understanding of the instructions.   The patient was advised to call back or seek an in-person evaluation if the symptoms worsen or if the condition fails to improve as anticipated.  I provided 14 minutes of non-face-to-face time during this encounter.   Karle Plumber, MD

## 2018-09-25 NOTE — Progress Notes (Signed)
Pt states he just got done using a Holter Monitor  Pt states he just mailed the monitor back today

## 2018-10-02 ENCOUNTER — Ambulatory Visit: Payer: Medicaid Other

## 2018-10-02 ENCOUNTER — Other Ambulatory Visit: Payer: Self-pay

## 2018-10-02 ENCOUNTER — Ambulatory Visit: Payer: Medicaid Other | Attending: Internal Medicine | Admitting: Pharmacist

## 2018-10-02 ENCOUNTER — Encounter: Payer: Self-pay | Admitting: Pharmacist

## 2018-10-02 VITALS — BP 107/71 | HR 58

## 2018-10-02 DIAGNOSIS — Z87891 Personal history of nicotine dependence: Secondary | ICD-10-CM | POA: Insufficient documentation

## 2018-10-02 DIAGNOSIS — I10 Essential (primary) hypertension: Secondary | ICD-10-CM

## 2018-10-02 DIAGNOSIS — I129 Hypertensive chronic kidney disease with stage 1 through stage 4 chronic kidney disease, or unspecified chronic kidney disease: Secondary | ICD-10-CM | POA: Insufficient documentation

## 2018-10-02 DIAGNOSIS — Z79899 Other long term (current) drug therapy: Secondary | ICD-10-CM | POA: Diagnosis not present

## 2018-10-02 DIAGNOSIS — I2510 Atherosclerotic heart disease of native coronary artery without angina pectoris: Secondary | ICD-10-CM | POA: Insufficient documentation

## 2018-10-02 DIAGNOSIS — Z951 Presence of aortocoronary bypass graft: Secondary | ICD-10-CM | POA: Diagnosis not present

## 2018-10-02 DIAGNOSIS — E785 Hyperlipidemia, unspecified: Secondary | ICD-10-CM | POA: Diagnosis not present

## 2018-10-02 DIAGNOSIS — N183 Chronic kidney disease, stage 3 (moderate): Secondary | ICD-10-CM | POA: Diagnosis not present

## 2018-10-02 NOTE — Patient Instructions (Signed)

## 2018-10-02 NOTE — Progress Notes (Signed)
   S:    PCP: Dr. Wynetta Emery Patient arrives in good spirits. Presents to the clinic for BP check.  Patient was referred and last seen by Primary Care Provider on 09/25/18. Pt was seen via telemedicine - unable to check BP at home.   PMH significant for: CAD s/p CABG x4, HTN, hyperlipidemia, ICM (last EF 50-55% in 2018), CKD III  Denies chest pain or dyspnea at rest. Does endorse DUE and occasional BLE swelling.    Patient reports adherence with medications.   Current BP Medications include:  Amlodipine 10 mg daily, furosemide 40 mg daily, Imdur 60 mg daily, lisinopril 20 mg daily, Lopressor 25 mg BID  Dietary habits include: compliant with salt restriction; denies caffeine intake Exercise habits include: limited with his HF symptoms  Family / Social history:  - FHx significant for: heart disease (mother), HTN (father) - Former smoker; quit in 2015 - Alcohol: rare use  O:  L arm after 5 minutes rest: 107/71, HR 58  Home BP readings: not taking  Last 3 Office BP readings: BP Readings from Last 3 Encounters:  10/02/18 107/71  10/25/17 126/77  07/29/17 138/72   BMET    Component Value Date/Time   NA 143 08/13/2018 0858   K 4.1 08/13/2018 0858   CL 104 08/13/2018 0858   CO2 21 08/13/2018 0858   GLUCOSE 128 (H) 08/13/2018 0858   GLUCOSE 139 (H) 06/16/2015 0959   BUN 22 08/13/2018 0858   CREATININE 2.41 (H) 08/13/2018 0858   CREATININE 1.88 (H) 06/16/2015 0959   CALCIUM 9.7 08/13/2018 0858   GFRNONAA 30 (L) 08/13/2018 0858   GFRNONAA 41 (L) 06/16/2015 0959   GFRAA 34 (L) 08/13/2018 0858   GFRAA 47 (L) 06/16/2015 0959   Renal function: CrCl cannot be calculated (Patient's most recent lab result is older than the maximum 21 days allowed.).  Clinical ASCVD: Yes   A/P: Hypertension longstanding currently controlled on current medications. BP Goal <130/80 mmHg. Patient is adherent with current medications. His Scr from June is elevated compared to last reading in 2019. Per  PCP, will obtain labs today to re-eval.  -Continued current regimen. Reinforced compliance. -ASCVD risk: encouraged compliance with medication regimen which includes high intensity statin, beta-blocker, and anti-platelet therapies    -F/u labs ordered - CMP, CBC, lipid -Counseled on lifestyle modifications for blood pressure control including reduced dietary sodium, increased exercise, adequate sleep  Results reviewed and written information provided. Total time in face-to-face counseling 15 minutes.   F/U Clinic Visit in October with PCP.    Benard Halsted, PharmD, [place] [phone]

## 2018-10-03 LAB — COMPREHENSIVE METABOLIC PANEL
ALT: 14 IU/L (ref 0–44)
AST: 14 IU/L (ref 0–40)
Albumin/Globulin Ratio: 1.5 (ref 1.2–2.2)
Albumin: 4 g/dL (ref 3.8–4.9)
Alkaline Phosphatase: 92 IU/L (ref 39–117)
BUN/Creatinine Ratio: 8 — ABNORMAL LOW (ref 9–20)
BUN: 17 mg/dL (ref 6–24)
Bilirubin Total: 0.3 mg/dL (ref 0.0–1.2)
CO2: 23 mmol/L (ref 20–29)
Calcium: 8.8 mg/dL (ref 8.7–10.2)
Chloride: 105 mmol/L (ref 96–106)
Creatinine, Ser: 2.14 mg/dL — ABNORMAL HIGH (ref 0.76–1.27)
GFR calc Af Amer: 39 mL/min/{1.73_m2} — ABNORMAL LOW (ref >59)
GFR calc non Af Amer: 34 mL/min/{1.73_m2} — ABNORMAL LOW (ref >59)
Globulin, Total: 2.7 g/dL (ref 1.5–4.5)
Glucose: 131 mg/dL — ABNORMAL HIGH (ref 65–99)
Potassium: 4.1 mmol/L (ref 3.5–5.2)
Sodium: 142 mmol/L (ref 134–144)
Total Protein: 6.7 g/dL (ref 6.0–8.5)

## 2018-10-03 LAB — CBC
Hematocrit: 39.8 % (ref 37.5–51.0)
Hemoglobin: 13.1 g/dL (ref 13.0–17.7)
MCH: 27.3 pg (ref 26.6–33.0)
MCHC: 32.9 g/dL (ref 31.5–35.7)
MCV: 83 fL (ref 79–97)
Platelets: 236 10*3/uL (ref 150–450)
RBC: 4.79 x10E6/uL (ref 4.14–5.80)
RDW: 13.6 % (ref 11.6–15.4)
WBC: 7.3 10*3/uL (ref 3.4–10.8)

## 2018-10-03 LAB — LIPID PANEL
Chol/HDL Ratio: 4.7 ratio (ref 0.0–5.0)
Cholesterol, Total: 135 mg/dL (ref 100–199)
HDL: 29 mg/dL — ABNORMAL LOW (ref >39)
LDL Calculated: 85 mg/dL (ref 0–99)
Triglycerides: 107 mg/dL (ref 0–149)
VLDL Cholesterol Cal: 21 mg/dL (ref 5–40)

## 2018-10-08 NOTE — Telephone Encounter (Signed)
The patient has been notified of the result and verbalized understanding.  All questions (if any) were answered. Wilma Flavin, RN 10/08/2018 8:54 AM

## 2018-10-08 NOTE — Telephone Encounter (Signed)
-----   Message from Burnell Blanks, MD sent at 10/07/2018  5:41 PM EDT ----- No bradycardia noted on his heart monitor. No changes.

## 2018-11-05 ENCOUNTER — Ambulatory Visit: Payer: Medicaid Other | Admitting: Cardiovascular Disease

## 2018-12-08 ENCOUNTER — Other Ambulatory Visit: Payer: Self-pay

## 2018-12-08 ENCOUNTER — Ambulatory Visit: Payer: Medicaid Other | Attending: Internal Medicine | Admitting: Internal Medicine

## 2018-12-08 ENCOUNTER — Encounter: Payer: Self-pay | Admitting: Internal Medicine

## 2018-12-08 VITALS — BP 128/80 | HR 66 | Temp 98.6°F | Resp 16 | Wt 258.4 lb

## 2018-12-08 DIAGNOSIS — D631 Anemia in chronic kidney disease: Secondary | ICD-10-CM | POA: Insufficient documentation

## 2018-12-08 DIAGNOSIS — Z2821 Immunization not carried out because of patient refusal: Secondary | ICD-10-CM

## 2018-12-08 DIAGNOSIS — I472 Ventricular tachycardia: Secondary | ICD-10-CM | POA: Insufficient documentation

## 2018-12-08 DIAGNOSIS — Z7982 Long term (current) use of aspirin: Secondary | ICD-10-CM | POA: Insufficient documentation

## 2018-12-08 DIAGNOSIS — I495 Sick sinus syndrome: Secondary | ICD-10-CM

## 2018-12-08 DIAGNOSIS — I471 Supraventricular tachycardia: Secondary | ICD-10-CM | POA: Diagnosis not present

## 2018-12-08 DIAGNOSIS — I10 Essential (primary) hypertension: Secondary | ICD-10-CM | POA: Diagnosis not present

## 2018-12-08 DIAGNOSIS — Z951 Presence of aortocoronary bypass graft: Secondary | ICD-10-CM | POA: Insufficient documentation

## 2018-12-08 DIAGNOSIS — Z79899 Other long term (current) drug therapy: Secondary | ICD-10-CM | POA: Insufficient documentation

## 2018-12-08 DIAGNOSIS — I5042 Chronic combined systolic (congestive) and diastolic (congestive) heart failure: Secondary | ICD-10-CM

## 2018-12-08 DIAGNOSIS — I130 Hypertensive heart and chronic kidney disease with heart failure and stage 1 through stage 4 chronic kidney disease, or unspecified chronic kidney disease: Secondary | ICD-10-CM | POA: Insufficient documentation

## 2018-12-08 DIAGNOSIS — Z8249 Family history of ischemic heart disease and other diseases of the circulatory system: Secondary | ICD-10-CM | POA: Diagnosis not present

## 2018-12-08 DIAGNOSIS — Z6830 Body mass index (BMI) 30.0-30.9, adult: Secondary | ICD-10-CM | POA: Insufficient documentation

## 2018-12-08 DIAGNOSIS — I2510 Atherosclerotic heart disease of native coronary artery without angina pectoris: Secondary | ICD-10-CM | POA: Diagnosis not present

## 2018-12-08 DIAGNOSIS — Z7902 Long term (current) use of antithrombotics/antiplatelets: Secondary | ICD-10-CM | POA: Diagnosis not present

## 2018-12-08 DIAGNOSIS — E669 Obesity, unspecified: Secondary | ICD-10-CM | POA: Diagnosis not present

## 2018-12-08 DIAGNOSIS — I25708 Atherosclerosis of coronary artery bypass graft(s), unspecified, with other forms of angina pectoris: Secondary | ICD-10-CM

## 2018-12-08 DIAGNOSIS — Z87891 Personal history of nicotine dependence: Secondary | ICD-10-CM | POA: Diagnosis not present

## 2018-12-08 DIAGNOSIS — G8929 Other chronic pain: Secondary | ICD-10-CM | POA: Diagnosis not present

## 2018-12-08 DIAGNOSIS — N183 Chronic kidney disease, stage 3 unspecified: Secondary | ICD-10-CM | POA: Diagnosis not present

## 2018-12-08 DIAGNOSIS — I255 Ischemic cardiomyopathy: Secondary | ICD-10-CM | POA: Diagnosis not present

## 2018-12-08 DIAGNOSIS — N1832 Chronic kidney disease, stage 3b: Secondary | ICD-10-CM

## 2018-12-08 MED ORDER — FUROSEMIDE 40 MG PO TABS: 40.0000 mg | ORAL_TABLET | Freq: Every day | ORAL | 1 refills | Status: DC

## 2018-12-08 MED ORDER — ISOSORBIDE MONONITRATE ER 60 MG PO TB24: 60.0000 mg | ORAL_TABLET | Freq: Every day | ORAL | 3 refills | Status: DC

## 2018-12-08 MED ORDER — POTASSIUM CHLORIDE CRYS ER 20 MEQ PO TBCR: 40.0000 meq | EXTENDED_RELEASE_TABLET | Freq: Every day | ORAL | 1 refills | Status: DC

## 2018-12-08 NOTE — Progress Notes (Signed)
Patient ID: Seth Keller, male    DOB: 12/04/1965  MRN: [id number]  CC: Hypertension   Subjective: Seth Keller is a 53 y.o. male who presents for chronic ds management.  Last visit was 2 months ago. 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   CAD s/p CABG: When I last spoke with him, he told me that his last colonoscopy procedure had to be aborted because heart rate had dropped into the 20s.  His cardiologist had him do a heart monitor.  This revealed heart rate of 49-152.  Predominant underlying rhythm was sinus rhythm.  He did have short run of ventricular tachycardia of 5 beats and supraventricular tachycardia.  He has a follow-up visit with Dr. Angelena Form in December. -CP occasionally. Last used SL Nitro 4 days ago.  Some SOB -LE edema at this time but can occur during the day Reports compliance with medications.  HTN: took meds already for the morning No device at home He limits salt in the foods.  CKD 3:  appt with his nephrologist Dr. Justin Mend is scheduled for next mth.  He is not on any NSAIDs.  He still makes good urine.  Obesity:  "I try to keep I healthy but I cheat on the wk ends." Drinks zero cal drinks and water Rarely eats beef and no pork  Patient Active Problem List   Diagnosis Date Noted  . Bradycardia 09/25/2018  . Colon polyps 08/14/2018  . Anemia of chronic renal failure 06/04/2018  . Secondary hyperparathyroidism of renal origin ([place]) 06/04/2018  . Chronic bilateral low back pain without sciatica 12/22/2015  . Obesity (BMI 30-39.9) 06/16/2015  . SOB (shortness of breath) 09/27/2014  . Left shoulder pain 09/08/2014  . CAD (coronary artery disease)   . Ischemic cardiomyopathy   . Chronic combined systolic and diastolic CHF (congestive heart failure) ([place])   . CKD (chronic kidney disease), stage III   . Tobacco abuse, in remission   . Constipation 01/11/2014  . S/P CABG  x 4 01/01/2014  . Essential hypertension 05/08/2012  . Microhematuria 05/08/2012  . Proteinuria 05/08/2012     Current Outpatient Medications on File Prior to Visit  Medication Sig Dispense Refill  . amLODipine (NORVASC) 10 MG tablet Take 1 tablet (10 mg total) by mouth daily. 90 tablet 1  . aspirin EC 81 MG tablet Take 1 tablet (81 mg total) by mouth daily. 90 tablet 3  . atorvastatin (LIPITOR) 80 MG tablet Take 1 tablet (80 mg total) by mouth daily. 90 tablet 1  . Blood Pressure Monitor DEVI Use as directed to check home blood pressure 2-3 times a week 1 Device 0  . Cholecalciferol (VITAMIN D) 2000 units tablet Take 1 tablet (2,000 Units total) by mouth daily. 90 tablet 3  . clopidogrel (PLAVIX) 75 MG tablet Take 1 tablet (75 mg total) by mouth daily. 90 tablet 1  . lisinopril (ZESTRIL) 20 MG tablet Take 1 tablet (20 mg total) by mouth daily. 90 tablet 1  . metoprolol tartrate (LOPRESSOR) 25 MG tablet Take 1 tablet (25 mg total) by mouth 2 (two) times daily. 180 tablet 1  . nitroGLYCERIN (NITROSTAT) 0.4 MG SL tablet Place 1 tablet (0.4 mg total) under the tongue every 5 (five) minutes as needed for chest pain. 90 tablet 3   No current facility-administered medications on file prior to visit.  Allergies  Allergen Reactions  . Shellfish Allergy Anaphylaxis    Throat close up, swelling    Social History   Socioeconomic History  . Marital status: Single    Spouse name: Not on file  . Number of children: 0  . Years of education: 20  . Highest education level: Not on file  Occupational History  . 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 Financial resource strain: Not on file  . Food insecurity    Worry: Not on file    Inability: Not on file  . Transportation needs    Medical: Not on file    Non-medical: Not on file  Tobacco Use  . Smoking status: Former Smoker    Packs/day: 0.50    Years: 20.00     Pack years: 10.00    Types: Cigarettes    Quit date: 12/24/2013    Years since quitting: 4.9  . Smokeless tobacco: Never Used  Substance and Sexual Activity  . Alcohol use: No    Alcohol/week: 0.0 standard drinks    Comment: socially, 1-2 x year  . Drug use: No  . Sexual activity: Yes    Birth control/protection: None  Lifestyle  . Physical activity    Days per week: Not on file    Minutes per session: Not on file  . Stress: Not on file  Relationships  . Social Herbalist on phone: Not on file    Gets together: Not on file    Attends religious service: Not on file    Active member of club or organization: Not on file    Attends meetings of clubs or organizations: Not on file    Relationship status: Not on file  . Intimate partner violence    Fear of current or ex partner: Not on file    Emotionally abused: Not on file    Physically abused: Not on file    Forced sexual activity: Not on file  Other Topics Concern  . Not on file  Social History Narrative   Lives with fiance.   From [place], originally.   Moved to [place] in 2012.     Family History  Problem Relation Age of Onset  . Heart disease Mother 17  . Hypertension Father 62  . Asthma Father   . Diabetes Paternal Grandfather   . Cancer Neg Hx     Past Surgical History:  Procedure Laterality Date  . CORONARY ARTERY BYPASS GRAFT N/A 01/01/2014   Procedure: CORONARY ARTERY BYPASS GRAFTING (CABG) times four using left internal mammary artery and right saphenous vein;  Surgeon: Gaye Pollack, MD;  Location: [facility];  Service: Open Heart Surgery;  Laterality: N/A;  . INTRAOPERATIVE TRANSESOPHAGEAL ECHOCARDIOGRAM N/A 01/01/2014   Procedure: INTRAOPERATIVE TRANSESOPHAGEAL ECHOCARDIOGRAM;  Surgeon: Gaye Pollack, MD;  Location: [facility];  Service: Open Heart Surgery;  Laterality: N/A;  . LEFT HEART CATHETERIZATION WITH CORONARY ANGIOGRAM N/A 12/29/2013   Procedure: LEFT HEART CATHETERIZATION WITH CORONARY  ANGIOGRAM;  Surgeon: Sinclair Grooms, MD;  Location[facility] LAB;  Service: Cardiovascular;  Laterality: N/A;  . none      ROS: Review of Systems Negative except as stated above  PHYSICAL EXAM: BP 128/80   Pulse 66   Temp 98.6 F (37 C) (Oral)   Resp 16   Wt 258 lb 6.4 oz (117.2 kg)   SpO2 97%   BMI  38.16 kg/m   Wt Readings from Last 3 Encounters:  12/08/18 258 lb 6.4 oz (117.2 kg)  10/25/17 249 lb 3.2 oz (113 kg)  07/29/17 243 lb 3.2 oz (110.3 kg)    Physical Exam  General appearance - alert, well appearing, and in no distress Mental status - normal mood, behavior, speech, dress, motor activity, and thought processes Neck - supple, no significant adenopathy Chest - clear to auscultation, no wheezes, rales or rhonchi, symmetric air entry Heart - normal rate, regular rhythm, normal S1, S2, no murmurs, rubs, clicks or gallops Extremities - peripheral pulses normal, trace to 1+ lower extremity edema   CMP Latest Ref Rng & Units 10/02/2018 08/13/2018 10/25/2017  Glucose 65 - 99 mg/dL 131(H) 128(H) -  BUN 6 - 24 mg/dL 17 22 -  Creatinine 0.76 - 1.27 mg/dL 2.14(H) 2.41(H) -  Sodium 134 - 144 mmol/L 142 143 -  Potassium 3.5 - 5.2 mmol/L 4.1 4.1 -  Chloride 96 - 106 mmol/L 105 104 -  CO2 20 - 29 mmol/L 23 21 -  Calcium 8.7 - 10.2 mg/dL 8.8 9.7 -  Total Protein 6.0 - 8.5 g/dL 6.7 - 7.5  Total Bilirubin 0.0 - 1.2 mg/dL 0.3 - 0.4  Alkaline Phos 39 - 117 IU/L 92 - 95  AST 0 - 40 IU/L 14 - 17  ALT 0 - 44 IU/L 14 - 23   Lipid Panel     Component Value Date/Time   CHOL 135 10/02/2018 1015   TRIG 107 10/02/2018 1015   HDL 29 (L) 10/02/2018 1015   CHOLHDL 4.7 10/02/2018 1015   CHOLHDL 4.9 04/14/2015 0943   VLDL 22 04/14/2015 0943   LDLCALC 85 10/02/2018 1015   LDLDIRECT 198 (H) 05/29/2012 1744    CBC    Component Value Date/Time   WBC 7.3 10/02/2018 1015   WBC 8.0 08/16/2014 1147   RBC 4.79 10/02/2018 1015   RBC 4.97 08/16/2014 1147   HGB 13.1 10/02/2018 1015   HCT  39.8 10/02/2018 1015   PLT 236 10/02/2018 1015   MCV 83 10/02/2018 1015   MCH 27.3 10/02/2018 1015   MCH 27.6 08/16/2014 1147   MCHC 32.9 10/02/2018 1015   MCHC 33.5 08/16/2014 1147   RDW 13.6 10/02/2018 1015   LYMPHSABS 3.5 12/31/2013 0315   MONOABS 1.1 (H) 12/31/2013 0315   EOSABS 0.3 12/31/2013 0315   BASOSABS 0.0 12/31/2013 0315    ASSESSMENT AND PLAN: 1. Essential hypertension At goal.  Continue current medications and low-salt diet  2. Chronic combined systolic and diastolic CHF (congestive heart failure) (HCC) Noted to have mild edema of the lower extremities.  Advised patient to take an extra half pill of Lasix today.  Advised to weigh himself at least twice a week and if weight increases more than 5 pounds and is associated with lower extremity edema, he should take an extra dose of furosemide that day. - potassium chloride SA (KLOR-CON M20) 20 MEQ tablet; Take 2 tablets (40 mEq total) by mouth daily.  Dispense: 180 tablet; Refill: 1 - furosemide (LASIX) 40 MG tablet; Take 1 tablet (40 mg total) by mouth daily.  Dispense: 90 tablet; Refill: 1  3. 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  Keep follow-up appointment with cardiology - isosorbide mononitrate (IMDUR) 60 MG 24 hr tablet; Take 1 tablet (60 mg total) by mouth daily.  Dispense: 90 tablet; Refill:  3  4. Tachy-brady syndrome ([place]) Keep follow-up appointment with cardiology  5. CKD (chronic kidney disease), stage III Stable.  Keep follow-up appointment with nephrology  6. Obesity (BMI 30-39.9) Dietary counseling given.  Encouraged him to be more active as his heart would allow.  7. Influenza vaccination declined   Patient was given the opportunity to ask questions.  Patient verbalized understanding of the plan and was able to repeat key elements of the plan.   No orders of  the defined types were placed in this encounter.    Requested Prescriptions   Signed Prescriptions Disp Refills  . potassium chloride SA (KLOR-CON M20) 20 MEQ tablet 180 tablet 1    Sig: Take 2 tablets (40 mEq total) by mouth daily.  . furosemide (LASIX) 40 MG tablet 90 tablet 1    Sig: Take 1 tablet (40 mg total) by mouth daily.  . isosorbide mononitrate (IMDUR) 60 MG 24 hr tablet 90 tablet 3    Sig: Take 1 tablet (60 mg total) by mouth daily.    Return in about 4 months (around 04/10/2019).  Karle Plumber, MD, FACP

## 2018-12-08 NOTE — Patient Instructions (Signed)
Try to weigh himself at least once a week.  If you find that you are having increased swelling in the legs with more than 5 pound weight gain in 1 week, you should take an extra dose of your furosemide for 1 to 2 days.  Try to limit the salt in the foods.

## 2019-01-21 NOTE — Progress Notes (Signed)
Chief Complaint  Patient presents with   Follow-up    CAD    History of Present Illness: 53 yo male with history of CKD, CAD s/p 4V CABG November 2015, HTN, HLD, ischemic cardiomyopathy here today for cardiac follow up. He was admitted to [facility] with a NSTEMI in November 2015 and cardiac cath showed severe three vessel CAD. Echo showed LV dysfunction with LVEF of 45%. He underwent CABG x 4. Post-op course was complicated by atrial fibrillation and he was placed on amiodarone with conversion to sinus rhythm. Amiodarone was stopped at his office visit in February 2016.  Echo in July 2018 with LVEF=50-55% and no wall motion abnormalities. Nuclear stress test in 2018 with evidence of scar but no large areas of ischemia. Imdur added at office visit in February 2019 for NTG responsive chest pain occurring with moderate exertion.   He is here today for follow up. The patient denies any chest pain, dyspnea, palpitations, lower extremity edema, orthopnea, PND, dizziness, near syncope or syncope.   Primary Care Physician: Ladell Pier, MD  Past Medical History:  Diagnosis Date   Atrial fibrillation with RVR ([place])    a. 12/2013 post-op CABG ->on amio.   CAD (coronary artery disease)    a. 12/2013 NSTEMI/Cath: 3VD;  b. 12/2013 CABG x 4: LIMA->LAD, VG->RI, VG->OM1->LPDA.   Chronic combined systolic and diastolic CHF (congestive heart failure) ([place])    a. 12/2013 Echo: EF 45%, Gr 2 DD.   CKD (chronic kidney disease), stage III    Hypertension    a. Dx ~ 2000.   Ischemic cardiomyopathy    a. 12/2013 Echo: EF 45%, Gr 2 DD, basal-mid inflat and inf AK, mild MR.   Tobacco abuse     Past Surgical History:  Procedure Laterality Date   CORONARY ARTERY BYPASS GRAFT N/A 01/01/2014   Procedure: CORONARY ARTERY BYPASS GRAFTING (CABG) times four using left internal mammary artery and right saphenous vein;  Surgeon: Gaye Pollack, MD;  Location: [facility];  Service: Open Heart Surgery;   Laterality: N/A;   INTRAOPERATIVE TRANSESOPHAGEAL ECHOCARDIOGRAM N/A 01/01/2014   Procedure: INTRAOPERATIVE TRANSESOPHAGEAL ECHOCARDIOGRAM;  Surgeon: Gaye Pollack, MD;  Location: [facility];  Service: Open Heart Surgery;  Laterality: N/A;   LEFT HEART CATHETERIZATION WITH CORONARY ANGIOGRAM N/A 12/29/2013   Procedure: LEFT HEART CATHETERIZATION WITH CORONARY ANGIOGRAM;  Surgeon: Sinclair Grooms, MD;  Location: [facility] CATH LAB;  Service: Cardiovascular;  Laterality: N/A;   none      Current Outpatient Medications  Medication Sig Dispense Refill   amLODipine (NORVASC) 10 MG tablet Take 1 tablet (10 mg total) by mouth daily. 90 tablet 1   aspirin EC 81 MG tablet Take 1 tablet (81 mg total) by mouth daily. 90 tablet 3   atorvastatin (LIPITOR) 80 MG tablet Take 1 tablet (80 mg total) by mouth daily. 90 tablet 1   Blood Pressure Monitor DEVI Use as directed to check home blood pressure 2-3 times a week 1 Device 0   Cholecalciferol (VITAMIN D) 2000 units tablet Take 1 tablet (2,000 Units total) by mouth daily. 90 tablet 3   furosemide (LASIX) 40 MG tablet Take 1 tablet (40 mg total) by mouth daily. 90 tablet 1   isosorbide mononitrate (IMDUR) 60 MG 24 hr tablet Take 1 tablet (60 mg total) by mouth daily. 90 tablet 3   lisinopril (ZESTRIL) 20 MG tablet Take 1 tablet (20 mg total) by mouth daily. 90 tablet 1   metoprolol  tartrate (LOPRESSOR) 25 MG tablet Take 1 tablet (25 mg total) by mouth 2 (two) times daily. 180 tablet 1   nitroGLYCERIN (NITROSTAT) 0.4 MG SL tablet Place 1 tablet (0.4 mg total) under the tongue every 5 (five) minutes as needed for chest pain. 90 tablet 3   potassium chloride SA (KLOR-CON M20) 20 MEQ tablet Take 2 tablets (40 mEq total) by mouth daily. 180 tablet 1   No current facility-administered medications for this visit.     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 strain: Not on file   Food insecurity    Worry: Not on file    Inability: Not on Lexicographer needs    Medical: Not on file    Non-medical: Not on file  Tobacco Use   Smoking status: Former Smoker    Packs/day: 0.50    Years: 20.00    Pack years: 10.00    Types: Cigarettes    Quit date: 12/24/2013    Years since quitting: 5.0   Smokeless tobacco: Never Used  Substance and Sexual Activity   Alcohol use: No    Alcohol/week: 0.0 standard drinks    Comment: socially, 1-2 x year   Drug use: No   Sexual activity: Yes    Birth control/protection: None  Lifestyle   Physical activity    Days per week: Not on file    Minutes per session: Not on file   Stress: Not on file  Relationships   Social connections    Talks on phone: Not on file    Gets together: Not on file    Attends religious service: Not on file    Active member of club or organization: Not on file    Attends meetings of clubs or organizations: Not on file    Relationship status: Not on file   Intimate partner violence    Fear of current or ex partner: Not on file    Emotionally abused: Not on file    Physically abused: Not on file    Forced sexual activity: Not on file  Other Topics Concern   Not on file  Social History Narrative   Lives with fiance.   From [place], originally.   Moved to [place] in 2012.     Family History  Problem Relation Age of Onset   Heart disease Mother 6   Hypertension Father 59   Asthma Father    Diabetes Paternal Grandfather    Cancer Neg Hx     Review of Systems:  As stated in the HPI and otherwise negative.   BP 140/86    Pulse 69    Ht 5'  10" (1.778 m)    Wt 263 lb (119.3 kg)    SpO2 97%    BMI 37.74 kg/m   Physical Examination:  General: Well developed, well nourished, NAD  HEENT: OP clear, mucus membranes moist  SKIN: warm, dry. No rashes. Neuro: No focal deficits  Musculoskeletal: Muscle strength 5/5 all ext  Psychiatric: Mood and affect normal  Neck: No JVD, no carotid bruits, no thyromegaly, no lymphadenopathy.  Lungs:Clear bilaterally, no wheezes, rhonci, crackles Cardiovascular: Regular rate and rhythm. No murmurs, gallops or rubs. Abdomen:Soft. Bowel sounds present. Non-tender.  Extremities: No lower extremity edema. Pulses are 2 + in the bilateral DP/PT.  Echo July 2018: - Left ventricle: The cavity size was normal. Wall thickness was   increased in a pattern of mild LVH. Systolic function was normal.   The estimated ejection fraction was in the range of 50% to 55%.   Wall motion was normal; there were no regional wall motion   abnormalities. Features are consistent with a pseudonormal left   ventricular filling pattern, with concomitant abnormal relaxation   and increased filling pressure (grade 2 diastolic dysfunction).  Cardiac cath 12/29/13: The left main coronary artery is widely patent. The left anterior descending artery is large and wraps around the left ventricular apex. There is a proximal eccentric 70% stenosis and a mid eccentric 60-70% stenosis. FFR beyond the mid vessel stenosis was 0.78. The apical LAD contains 90% segmental stenosis before the apical segment. The first diagonal is totally occluded. The left circumflex artery is severely and diffusely diseased. The mid circumflex contains an eccentric 80% stenosis. There is total occlusion of a moderate sized first obtuse marginal that arises beyond an 80% stenosis in the mid vessel. The distal vessel is diffusely diseased with up to 85% stenosis before the margin of the second obtuse marginal and the PDA.Marland Kitchen The ramus intermedius is moderate in  size and contains proximal to mid diffuse disease with up to 80% stenosis. The distal vessel may not be graftable. The right coronary artery is nondominant. The right coronary is totally occluded in the mid-segment.  EKG:  EKG is ordered today. The ekg ordered today demonstrates NSR, rate 63 bpm. LAFB   Recent Labs: 10/02/2018: ALT 14; BUN 17; Creatinine, Ser 2.14; Hemoglobin 13.1; Platelets 236; Potassium 4.1; Sodium 142   Lipid Panel    Component Value Date/Time   CHOL 135 10/02/2018 1015   TRIG 107 10/02/2018 1015   HDL 29 (L) 10/02/2018 1015   CHOLHDL 4.7 10/02/2018 1015   CHOLHDL 4.9 04/14/2015 0943   VLDL 22 04/14/2015 0943   LDLCALC 85 10/02/2018 1015   LDLDIRECT 198 (H) 05/29/2012 1744     Wt Readings from Last 3 Encounters:  01/23/19 263 lb (119.3 kg)  12/08/18 258 lb 6.4 oz (117.2 kg)  10/25/17 249 lb 3.2 oz (113 kg)     Other studies Reviewed: Additional studies/ records that were reviewed today include:. Review of the above records demonstrates:   Assessment and Plan:   1. CAD s/p CABG with angina: Chronic chest pain with no change in severity or frequency. Continue ASA, Plavix, statin, beta blocker and Imdur.   2. Ischemic cardiomyopathy: LVEF 50-555% by echo in July 2018.   3. HTN: BP controlled  4. Tobacco abuse, in remission  5. Chronic systolic CHF: Weight stable. No evidence of volume overload. Continue Lasix.   Current medicines are reviewed at length with the patient today.  The patient does not have concerns regarding medicines.  The following changes have been made:  no change  Labs/ tests ordered today include:   Orders Placed This Encounter  Procedures   EKG 12-Lead    Disposition:   FU with me  in 12 months  Signed, Lauree Chandler, MD 01/23/2019 9:05 AM    [address], [place], [place]  [address]  Phone: [phone]; Fax: [phone]

## 2019-01-23 ENCOUNTER — Encounter: Payer: Self-pay | Admitting: Cardiovascular Disease

## 2019-01-23 ENCOUNTER — Ambulatory Visit: Payer: Medicaid Other | Admitting: Cardiovascular Disease

## 2019-01-23 ENCOUNTER — Other Ambulatory Visit: Payer: Self-pay

## 2019-01-23 VITALS — BP 140/86 | HR 69 | Ht 70.0 in | Wt 263.0 lb

## 2019-01-23 DIAGNOSIS — I5042 Chronic combined systolic (congestive) and diastolic (congestive) heart failure: Secondary | ICD-10-CM | POA: Diagnosis not present

## 2019-01-23 DIAGNOSIS — I10 Essential (primary) hypertension: Secondary | ICD-10-CM

## 2019-01-23 DIAGNOSIS — I25118 Atherosclerotic heart disease of native coronary artery with other forms of angina pectoris: Secondary | ICD-10-CM

## 2019-01-23 DIAGNOSIS — I255 Ischemic cardiomyopathy: Secondary | ICD-10-CM | POA: Diagnosis not present

## 2019-01-23 NOTE — Patient Instructions (Addendum)
Medication Instructions:  Your physician has recommended you make the following change in your medication:  1.) stop Plavix after you finish current supply  *If you need a refill on your cardiac medications before your next appointment, please call your pharmacy*  Lab Work: none If you have labs (blood work) drawn today and your tests are completely normal, you will receive your results only by: Marland Kitchen MyChart Message (if you have MyChart) OR . A paper copy in the mail If you have any lab test that is abnormal or we need to change your treatment, we will call you to review the results.  Testing/Procedures: none  Follow-Up: At [facility], you and your health needs are our priority.  As part of our continuing mission to provide you with exceptional heart care, we have created designated Provider Care Teams.  These Care Teams include your primary Cardiologist (physician) and Advanced Practice Providers (APPs -  Physician Assistants and Nurse Practitioners) who all work together to provide you with the care you need, when you need it.  Your next appointment:   12 month(s)  The format for your next appointment:   In Person  Provider:   Lauree Chandler, MD  Other Instructions

## 2019-01-29 ENCOUNTER — Ambulatory Visit: Payer: Medicaid Other | Attending: Internal Medicine | Admitting: Physician Assistant

## 2019-01-29 ENCOUNTER — Other Ambulatory Visit: Payer: Self-pay

## 2019-01-29 DIAGNOSIS — R202 Paresthesia of skin: Secondary | ICD-10-CM | POA: Diagnosis not present

## 2019-01-29 MED ORDER — GABAPENTIN 300 MG PO CAPS: 300.0000 mg | ORAL_CAPSULE | Freq: Two times a day (BID) | ORAL | 0 refills | Status: DC

## 2019-01-29 NOTE — Progress Notes (Signed)
Patient is having numbness in lower abdomen and waistline.

## 2019-01-29 NOTE — Progress Notes (Signed)
Virtual Visit via Telephone Note  I connected with Seth Keller on 01/29/19 at  4:10 PM EST by telephone and verified that I am speaking with the correct person using two identifiers.   I discussed the limitations, risks, security and privacy concerns of performing an evaluation and management service by telephone and the availability of in person appointments. I also discussed with the patient that there may be a patient responsible charge related to this service. The patient expressed understanding and agreed to proceed.  Patient location:  home My Location:  [place] office Persons on the call: me and the patient   History of Present Illness:  Complaining of legs and abdomen felt tight and he is having tingling across abdomen. Feels like when you hit your funny bone but across B abdomen and lower back and into the top of his legs.  Occurring for 4 days.  He has had shingles in the past.  He says this doesn't feel like that.  No fever.  Appetite is good.  No abdominal pain.  Bowels and bladder are moving normally.  No urinary s/sx.  No back injury.  No pain at all-just the tingling.  No change in BM.  No melena.  No hematochezia.  No N/V/D.  Walking fine    Observations/Objective:  NAD.  A&Ox3   Assessment and Plan: 1. Paresthesias Unknown etiology.  Shingles prodrome unlikely bc he has had shingles and he has paresthesias B.  To ED if develops pain, fever, vomiting, etc.  Patient agrees - gabapentin (NEURONTIN) 300 MG capsule; Take 1 capsule (300 mg total) by mouth 2 (two) times daily. X 1 week then prn nerve sensations  Dispense: 60 capsule; Refill: 0    Follow Up Instructions: With PCP at regular interval   I discussed the assessment and treatment plan with the patient. The patient was provided an opportunity to ask questions and all were answered. The patient agreed with the plan and demonstrated an understanding of the instructions.   The patient was advised to call back or seek an  in-person evaluation if the symptoms worsen or if the condition fails to improve as anticipated.  I provided 12 minutes of non-face-to-face time during this encounter.   Freeman Caldron, PA-C  Patient ID: Seth Keller, male   DOB: 1965-06-11, 53 y.o.   MRN: [id number]

## 2019-04-08 ENCOUNTER — Telehealth: Payer: Self-pay | Admitting: Internal Medicine

## 2019-04-08 NOTE — Telephone Encounter (Signed)
Patient called to check on his scat part D paperwork. Please follow up at your earliest convenience. Patient dropped it off Friday 04/03/2019.

## 2019-04-08 NOTE — Telephone Encounter (Signed)
Will forward to jasmine and maddie

## 2019-05-05 ENCOUNTER — Other Ambulatory Visit: Payer: Self-pay | Admitting: Internal Medicine

## 2019-05-05 DIAGNOSIS — I10 Essential (primary) hypertension: Secondary | ICD-10-CM

## 2019-06-09 ENCOUNTER — Other Ambulatory Visit: Payer: Self-pay | Admitting: Internal Medicine

## 2019-06-09 DIAGNOSIS — I5042 Chronic combined systolic (congestive) and diastolic (congestive) heart failure: Secondary | ICD-10-CM

## 2019-06-09 DIAGNOSIS — I2510 Atherosclerotic heart disease of native coronary artery without angina pectoris: Secondary | ICD-10-CM

## 2019-06-09 DIAGNOSIS — I10 Essential (primary) hypertension: Secondary | ICD-10-CM

## 2019-07-05 ENCOUNTER — Other Ambulatory Visit: Payer: Self-pay | Admitting: Internal Medicine

## 2019-07-05 DIAGNOSIS — I10 Essential (primary) hypertension: Secondary | ICD-10-CM

## 2019-07-06 ENCOUNTER — Other Ambulatory Visit: Payer: Self-pay

## 2019-07-06 ENCOUNTER — Encounter: Payer: Self-pay | Admitting: Internal Medicine

## 2019-07-06 ENCOUNTER — Ambulatory Visit: Payer: Medicare Other | Attending: Internal Medicine | Admitting: Internal Medicine

## 2019-07-06 VITALS — BP 134/78 | HR 63 | Temp 98.1°F | Resp 16 | Wt 264.0 lb

## 2019-07-06 DIAGNOSIS — I130 Hypertensive heart and chronic kidney disease with heart failure and stage 1 through stage 4 chronic kidney disease, or unspecified chronic kidney disease: Secondary | ICD-10-CM | POA: Insufficient documentation

## 2019-07-06 DIAGNOSIS — Z8249 Family history of ischemic heart disease and other diseases of the circulatory system: Secondary | ICD-10-CM | POA: Insufficient documentation

## 2019-07-06 DIAGNOSIS — I25118 Atherosclerotic heart disease of native coronary artery with other forms of angina pectoris: Secondary | ICD-10-CM | POA: Insufficient documentation

## 2019-07-06 DIAGNOSIS — I25708 Atherosclerosis of coronary artery bypass graft(s), unspecified, with other forms of angina pectoris: Secondary | ICD-10-CM | POA: Diagnosis not present

## 2019-07-06 DIAGNOSIS — I5042 Chronic combined systolic (congestive) and diastolic (congestive) heart failure: Secondary | ICD-10-CM | POA: Diagnosis not present

## 2019-07-06 DIAGNOSIS — Z87891 Personal history of nicotine dependence: Secondary | ICD-10-CM | POA: Insufficient documentation

## 2019-07-06 DIAGNOSIS — Z951 Presence of aortocoronary bypass graft: Secondary | ICD-10-CM | POA: Insufficient documentation

## 2019-07-06 DIAGNOSIS — N1832 Chronic kidney disease, stage 3b: Secondary | ICD-10-CM | POA: Diagnosis not present

## 2019-07-06 DIAGNOSIS — N2581 Secondary hyperparathyroidism of renal origin: Secondary | ICD-10-CM

## 2019-07-06 DIAGNOSIS — Z7189 Other specified counseling: Secondary | ICD-10-CM | POA: Insufficient documentation

## 2019-07-06 DIAGNOSIS — Z6838 Body mass index (BMI) 38.0-38.9, adult: Secondary | ICD-10-CM | POA: Diagnosis not present

## 2019-07-06 DIAGNOSIS — Z7982 Long term (current) use of aspirin: Secondary | ICD-10-CM | POA: Diagnosis not present

## 2019-07-06 DIAGNOSIS — I255 Ischemic cardiomyopathy: Secondary | ICD-10-CM | POA: Diagnosis not present

## 2019-07-06 DIAGNOSIS — Z79899 Other long term (current) drug therapy: Secondary | ICD-10-CM | POA: Insufficient documentation

## 2019-07-06 DIAGNOSIS — I10 Essential (primary) hypertension: Secondary | ICD-10-CM

## 2019-07-06 DIAGNOSIS — R202 Paresthesia of skin: Secondary | ICD-10-CM | POA: Diagnosis not present

## 2019-07-06 DIAGNOSIS — I495 Sick sinus syndrome: Secondary | ICD-10-CM | POA: Diagnosis not present

## 2019-07-06 DIAGNOSIS — Z23 Encounter for immunization: Secondary | ICD-10-CM

## 2019-07-06 MED ORDER — FUROSEMIDE 40 MG PO TABS: 40.0000 mg | ORAL_TABLET | Freq: Every day | ORAL | 1 refills | Status: DC

## 2019-07-06 MED ORDER — LISINOPRIL 20 MG PO TABS: 20.0000 mg | ORAL_TABLET | Freq: Every day | ORAL | 1 refills | Status: DC

## 2019-07-06 MED ORDER — ATORVASTATIN CALCIUM 80 MG PO TABS: 80.0000 mg | ORAL_TABLET | Freq: Every day | ORAL | 1 refills | Status: DC

## 2019-07-06 MED ORDER — POTASSIUM CHLORIDE CRYS ER 20 MEQ PO TBCR: 40.0000 meq | EXTENDED_RELEASE_TABLET | Freq: Every day | ORAL | 1 refills | Status: DC

## 2019-07-06 MED ORDER — ISOSORBIDE MONONITRATE ER 60 MG PO TB24: 60.0000 mg | ORAL_TABLET | Freq: Every day | ORAL | 3 refills | Status: DC

## 2019-07-06 MED ORDER — NITROGLYCERIN 0.4 MG SL SUBL: 0.4000 mg | SUBLINGUAL_TABLET | SUBLINGUAL | 3 refills | Status: DC | PRN

## 2019-07-06 MED ORDER — AMLODIPINE BESYLATE 10 MG PO TABS: 10.0000 mg | ORAL_TABLET | Freq: Every day | ORAL | 0 refills | Status: DC

## 2019-07-06 MED ORDER — METOPROLOL TARTRATE 25 MG PO TABS: 25.0000 mg | ORAL_TABLET | Freq: Two times a day (BID) | ORAL | 3 refills | Status: DC

## 2019-07-06 NOTE — Patient Instructions (Signed)

## 2019-07-06 NOTE — Progress Notes (Signed)
Patient ID: Seth Keller, male    DOB: [birth year]  MRN: [id number]  CC: Chest Pain   Subjective: Seth Keller is a 54 y.o. male who presents for chronic disease management His concerns today include:  Pt with hx of CAD s/p CABG x 4 (Dr. Angelena Form), HTN, HL, ICM but last EF 08/2016 improved to 50-55%, 2DD, CKD stage 3 (followed by Dr. Justin Mend), obesity.  Just got Medicare about 1 mth ago   HYPERTENSION/CAD/ICM Saw Dr.McAlhany 01/2019.  I do not see mention of his holter results as discussed with me on last visit.  However patient states he was told that nothing further needs to be done. Currently taking: see medication list Med Adherence: [x]  Yes    []  No Medication side effects: []  Yes    [x]  No Adherence with salt restriction: [x]  Yes    []  No Home Monitoring?: []  Yes    [x]  No Monitoring Frequency: []  Yes    []  No Home BP results range: []  Yes    []  No SOB? []  Yes    [x]  No Chest Pain?: [x]  Yes -with heavy exertion, no CP at rest.  No increase in frequency.  Last used SL Nitro 2-3 wks ago.   Leg swelling?: [x]  Yes - goes away with elev legs Headaches?: []  Yes    [x]  No Dizziness? []  Yes    [x]  No Comments:  No palpitations or syncope, PND, orthopnea  CKD 3: sees Dr. Justin Mend 2 x a yr.  Has appt coming up in 1 mth. Making good urine  Has sec hyperparathyroidism due to CKD per last nephrology note.  Obesity: Trying to lose wgh. tries to get in at least 3000 steps a day. Doing fairly good with eating habits. He tries to cook as much as possible instead of eating out.  HM:  Does not plan to get COVID vaccine.  He does not trust it. And "I don't do crowds at all."   Patient Active Problem List   Diagnosis Date Noted  . Tachy-brady syndrome ([place]) 07/06/2019  . Bradycardia 09/25/2018  . Colon polyps 08/14/2018  . Anemia of chronic renal failure 06/04/2018  . Secondary hyperparathyroidism of renal origin ([place]) 06/04/2018  . Chronic bilateral low back pain without sciatica 12/22/2015  .  Obesity (BMI 30-39.9) 06/16/2015  . SOB (shortness of breath) 09/27/2014  . Left shoulder pain 09/08/2014  . CAD (coronary artery disease)   . Ischemic cardiomyopathy   . Chronic combined systolic and diastolic CHF (congestive heart failure) ([place])   . CKD (chronic kidney disease), stage III   . Tobacco abuse, in remission   . Constipation 01/11/2014  . S/P CABG x 4 01/01/2014  . Essential hypertension 05/08/2012  . Microhematuria 05/08/2012  . Proteinuria 05/08/2012     Current Outpatient Medications on File Prior to Visit  Medication Sig Dispense Refill  . aspirin EC 81 MG tablet Take 1 tablet (81 mg total) by mouth daily. 90 tablet 3  . Blood Pressure Monitor DEVI Use as directed to check home blood pressure 2-3 times a week 1 Device 0  . Cholecalciferol (VITAMIN D) 2000 units tablet Take 1 tablet (2,000 Units total) by mouth daily. 90 tablet 3  . gabapentin (NEURONTIN) 300 MG capsule Take 1 capsule (300 mg total) by mouth 2 (two) times daily. X 1 week then prn nerve sensations 60 capsule 0   No current facility-administered medications on file prior to visit.    Allergies  Allergen Reactions  .  Shellfish Allergy Anaphylaxis    Throat close up, swelling    Social History   Socioeconomic History  . Marital status: Single    Spouse name: Not on file  . Number of children: 0  . Years of education: 43  . Highest education level: Not on file  Occupational History  . 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 Smoking status: Former Smoker    Packs/day: 0.50    Years: 20.00    Pack years: 10.00    Types: Cigarettes    Quit date: 12/24/2013    Years since quitting: 5.5  . Smokeless tobacco: Never Used  Substance and Sexual Activity  . Alcohol use: No    Alcohol/week: 0.0 standard drinks    Comment: socially, 1-2 x year  . Drug use: No  . Sexual activity: Yes    Birth control/protection: None  Other  Topics Concern  . Not on file  Social History Narrative   Lives with fiance.   From [place], originally.   Moved to [place] in 2012.    Social Determinants of Health   Financial Resource Strain:   . Difficulty of Paying Living Expenses:   Food Insecurity:   . Worried About Charity fundraiser in the Last Year:   . Arboriculturist in the Last Year:   Transportation Needs:   . Film/video editor (Medical):   Marland Kitchen Lack of Transportation (Non-Medical):   Physical Activity:   . Days of Exercise per Week:   . Minutes of Exercise per Session:   Stress:   . Feeling of Stress :   Social Connections:   . Frequency of Communication with Friends and Family:   . Frequency of Social Gatherings with Friends and Family:   . Attends Religious Services:   . Active Member of Clubs or Organizations:   . Attends Archivist Meetings:   Marland Kitchen Marital Status:   Intimate Partner Violence:   . Fear of Current or Ex-Partner:   . Emotionally Abused:   Marland Kitchen Physically Abused:   . Sexually Abused:     Family History  Problem Relation Age of Onset  . Heart disease Mother 58  . Hypertension Father 63  . Asthma Father   . Diabetes Paternal Grandfather   . Cancer Neg Hx     Past Surgical History:  Procedure Laterality Date  . CORONARY ARTERY BYPASS GRAFT N/A 01/01/2014   Procedure: CORONARY ARTERY BYPASS GRAFTING (CABG) times four using left internal mammary artery and right saphenous vein;  Surgeon: Gaye Pollack, MD;  Location: [facility];  Service: Open Heart Surgery;  Laterality: N/A;  . INTRAOPERATIVE TRANSESOPHAGEAL ECHOCARDIOGRAM N/A 01/01/2014   Procedure: INTRAOPERATIVE TRANSESOPHAGEAL ECHOCARDIOGRAM;  Surgeon: Gaye Pollack, MD;  Location: [facility];  Service: Open Heart Surgery;  Laterality: N/A;  . LEFT HEART CATHETERIZATION WITH CORONARY ANGIOGRAM N/A 12/29/2013   Procedure: LEFT HEART CATHETERIZATION WITH CORONARY ANGIOGRAM;  Surgeon: Sinclair Grooms, MD;  Location: [facility] CATH [facility];   Service: Cardiovascular;  Laterality: N/A;  . none      ROS: Review of Systems Negative except as stated above  PHYSICAL EXAM: BP 134/78   Pulse 63   Temp 98.1 F (36.7 C)   Resp 16   Wt 264 lb (119.7 kg)   SpO2 97%   BMI 37.88 kg/m   Wt Readings from Last 3 Encounters:  07/06/19 264 lb (119.7  kg)  01/23/19 263 lb (119.3 kg)  12/08/18 258 lb 6.4 oz (117.2 kg)    Physical Exam  General appearance - alert, well appearing, middle-aged African-American male and in no distress Mental status - normal mood, behavior, speech, dress, motor activity, and thought processes Mouth - mucous membranes moist, pharynx normal without lesions Neck - supple, no significant adenopathy Chest - clear to auscultation, no wheezes, rales or rhonchi, symmetric air entry Heart - normal rate, regular rhythm, normal S1, S2, no murmurs, rubs, clicks or gallops Extremities -trace to 1+ lower extremity edema bilaterally  CMP Latest Ref Rng & Units 10/02/2018 08/13/2018 10/25/2017  Glucose 65 - 99 mg/dL 131(H) 128(H) -  BUN 6 - 24 mg/dL 17 22 -  Creatinine 0.76 - 1.27 mg/dL 2.14(H) 2.41(H) -  Sodium 134 - 144 mmol/L 142 143 -  Potassium 3.5 - 5.2 mmol/L 4.1 4.1 -  Chloride 96 - 106 mmol/L 105 104 -  CO2 20 - 29 mmol/L 23 21 -  Calcium 8.7 - 10.2 mg/dL 8.8 9.7 -  Total Protein 6.0 - 8.5 g/dL 6.7 - 7.5  Total Bilirubin 0.0 - 1.2 mg/dL 0.3 - 0.4  Alkaline Phos 39 - 117 IU/L 92 - 95  AST 0 - 40 IU/L 14 - 17  ALT 0 - 44 IU/L 14 - 23   Lipid Panel     Component Value Date/Time   CHOL 135 10/02/2018 1015   TRIG 107 10/02/2018 1015   HDL 29 (L) 10/02/2018 1015   CHOLHDL 4.7 10/02/2018 1015   CHOLHDL 4.9 04/14/2015 0943   VLDL 22 04/14/2015 0943   LDLCALC 85 10/02/2018 1015   LDLDIRECT 198 (H) 05/29/2012 1744    CBC    Component Value Date/Time   WBC 7.3 10/02/2018 1015   WBC 8.0 08/16/2014 1147   RBC 4.79 10/02/2018 1015   RBC 4.97 08/16/2014 1147   HGB 13.1 10/02/2018 1015   HCT 39.8  10/02/2018 1015   PLT 236 10/02/2018 1015   MCV 83 10/02/2018 1015   MCH 27.3 10/02/2018 1015   MCH 27.6 08/16/2014 1147   MCHC 32.9 10/02/2018 1015   MCHC 33.5 08/16/2014 1147   RDW 13.6 10/02/2018 1015   LYMPHSABS 3.5 12/31/2013 0315   MONOABS 1.1 (H) 12/31/2013 0315   EOSABS 0.3 12/31/2013 0315   BASOSABS 0.0 12/31/2013 0315   Depression screen PHQ 2/9 07/06/2019 12/08/2018 10/25/2017  Decreased Interest 0 0 0  Down, Depressed, Hopeless 0 0 1  PHQ - 2 Score 0 0 1  Altered sleeping 0 - -  Tired, decreased energy 1 - -  Change in appetite 0 - -  Feeling bad or failure about yourself  0 - -  Trouble concentrating 0 - -  Moving slowly or fidgety/restless 0 - -  Suicidal thoughts 0 - -  PHQ-9 Score 1 - -    ASSESSMENT AND PLAN:  1. Essential hypertension Close to goal.  Continue current medications and low-salt diet - amLODipine (NORVASC) 10 MG tablet; Take 1 tablet (10 mg total) by mouth daily. Must have office visit for refills  Dispense: 90 tablet; Refill: 0 - lisinopril (ZESTRIL) 20 MG tablet; Take 1 tablet (20 mg total) by mouth daily.  Dispense: 90 tablet; Refill: 1 - metoprolol tartrate (LOPRESSOR) 25 MG tablet; Take 1 tablet (25 mg total) by mouth 2 (two) times daily.  Dispense: 180 tablet; Refill: 3  2. Coronary artery disease of bypass graft of native heart with stable angina pectoris ([place]) Continue  guideline directed therapy including beta-blocker, aspirin, Lipitor.  Advised to report any increased frequency of angina or angina at rest - atorvastatin (LIPITOR) 80 MG tablet; Take 1 tablet (80 mg total) by mouth daily.  Dispense: 90 tablet; Refill: 1 - isosorbide mononitrate (IMDUR) 60 MG 24 hr tablet; Take 1 tablet (60 mg total) by mouth daily.  Dispense: 90 tablet; Refill: 3 - metoprolol tartrate (LOPRESSOR) 25 MG tablet; Take 1 tablet (25 mg total) by mouth 2 (two) times daily.  Dispense: 180 tablet; Refill: 3 - nitroGLYCERIN (NITROSTAT) 0.4 MG SL tablet; Place 1 tablet  (0.4 mg total) under the tongue every 5 (five) minutes as needed for chest pain.  Dispense: 90 tablet; Refill: 3  3. Ischemic cardiomyopathy He does have some lower extremity edema today.  Advised to take an extra dose of furosemide whenever he noticed increased swelling - furosemide (LASIX) 40 MG tablet; Take 1 tablet (40 mg total) by mouth daily.  Dispense: 90 tablet; Refill: 1 - metoprolol tartrate (LOPRESSOR) 25 MG tablet; Take 1 tablet (25 mg total) by mouth 2 (two) times daily.  Dispense: 180 tablet; Refill: 3 - potassium chloride SA (KLOR-CON M20) 20 MEQ tablet; Take 2 tablets (40 mEq total) by mouth daily.  Dispense: 180 tablet; Refill: 1  4. Stage 3b chronic kidney disease Stable and followed by nephrology - Basic Metabolic Panel  5. Class 2 severe obesity due to excess calories with serious comorbidity and body mass index (BMI) of 38.0 to 38.9 in adult [facility]) Commended him on improving on his eating habits.  Exercise as tolerated.  6. Paresthesias On gabapentin as prescribed by PA on last visit  7. Tachy-brady syndrome [facility]) Patient denies any further episodes of palpitations.  8. Secondary hyperparathyroidism, renal ([place]) Followed by nephrology  9. High priority for COVID-19 virus vaccination Discussed the hesitations that he has regarding receiving the COVID-19 vaccines.  Advised that the vaccines are relatively safe and effective.  Patient states he will consider getting the vaccine.    Patient was given the opportunity to ask questions.  Patient verbalized understanding of the plan and was able to repeat key elements of the plan.   Orders Placed This Encounter  Procedures  . Basic Metabolic Panel     Requested Prescriptions   Signed Prescriptions Disp Refills  . amLODipine (NORVASC) 10 MG tablet 90 tablet 0    Sig: Take 1 tablet (10 mg total) by mouth daily. Must have office visit for refills  . atorvastatin (LIPITOR) 80 MG tablet 90 tablet 1    Sig: Take 1  tablet (80 mg total) by mouth daily.  . furosemide (LASIX) 40 MG tablet 90 tablet 1    Sig: Take 1 tablet (40 mg total) by mouth daily.  . isosorbide mononitrate (IMDUR) 60 MG 24 hr tablet 90 tablet 3    Sig: Take 1 tablet (60 mg total) by mouth daily.  Marland Kitchen lisinopril (ZESTRIL) 20 MG tablet 90 tablet 1    Sig: Take 1 tablet (20 mg total) by mouth daily.  . metoprolol tartrate (LOPRESSOR) 25 MG tablet 180 tablet 3    Sig: Take 1 tablet (25 mg total) by mouth 2 (two) times daily.  . nitroGLYCERIN (NITROSTAT) 0.4 MG SL tablet 90 tablet 3    Sig: Place 1 tablet (0.4 mg total) under the tongue every 5 (five) minutes as needed for chest pain.  . potassium chloride SA (KLOR-CON M20) 20 MEQ tablet 180 tablet 1    Sig: Take 2 tablets (  40 mEq total) by mouth daily.    Return in about 7 weeks (around 08/24/2019) for Welcome to [facility]ness Visit.  Karle Plumber, MD, FACP

## 2019-07-07 LAB — BASIC METABOLIC PANEL
BUN/Creatinine Ratio: 9 (ref 9–20)
BUN: 26 mg/dL — ABNORMAL HIGH (ref 6–24)
CO2: 21 mmol/L (ref 20–29)
Calcium: 9.2 mg/dL (ref 8.7–10.2)
Chloride: 106 mmol/L (ref 96–106)
Creatinine, Ser: 2.74 mg/dL — ABNORMAL HIGH (ref 0.76–1.27)
GFR calc Af Amer: 29 mL/min/{1.73_m2} — ABNORMAL LOW (ref >59)
GFR calc non Af Amer: 25 mL/min/{1.73_m2} — ABNORMAL LOW (ref >59)
Glucose: 103 mg/dL — ABNORMAL HIGH (ref 65–99)
Potassium: 4.5 mmol/L (ref 3.5–5.2)
Sodium: 142 mmol/L (ref 134–144)

## 2019-08-12 ENCOUNTER — Encounter: Payer: Self-pay | Admitting: Internal Medicine

## 2019-08-12 NOTE — Progress Notes (Signed)
Note received from [facility].  Patient seen 07/27/2019.  Labs done revealed BUN of 24 creatinine of 2.79 GFR of 28.  CBC was normal with H/H of 13.2/39.8, PTH 143, urinalysis with 3+ protein.

## 2019-08-27 ENCOUNTER — Other Ambulatory Visit: Payer: Self-pay | Admitting: Internal Medicine

## 2019-08-27 ENCOUNTER — Other Ambulatory Visit: Payer: Self-pay

## 2019-08-27 ENCOUNTER — Encounter: Payer: Self-pay | Admitting: Pharmacist

## 2019-08-27 ENCOUNTER — Ambulatory Visit: Payer: Medicare Other | Attending: Internal Medicine | Admitting: Pharmacist

## 2019-08-27 VITALS — BP 122/76 | HR 59 | Temp 98.6°F | Ht 71.0 in | Wt 264.0 lb

## 2019-08-27 DIAGNOSIS — Z1159 Encounter for screening for other viral diseases: Secondary | ICD-10-CM

## 2019-08-27 DIAGNOSIS — Z0000 Encounter for general adult medical examination without abnormal findings: Secondary | ICD-10-CM | POA: Diagnosis not present

## 2019-08-27 NOTE — Progress Notes (Signed)
Subjective:   Seth Keller is a 54 y.o. male who presents for an Initial Medicare Annual Wellness Visit.  Cardiac Risk Factors include: dyslipidemia;hypertension;male gender;sedentary lifestyle  Objective:    Today's Vitals   08/27/19 1013  BP: 122/76  Pulse: (!) 59  Temp: 98.6 F (37 C)  Weight: 264 lb (119.7 kg)  Height: 5\' 11"  (1.803 m)  PainSc: 0-No pain   Body mass index is 36.82 kg/m.  Advanced Directives 08/27/2019 08/21/2016 12/22/2015 06/16/2015 09/27/2014 09/13/2014 08/16/2014  Does Patient Have a Medical Advance Directive? No No No No No No No  Would patient like information on creating a medical advance directive? Yes (Inpatient - patient defers creating a medical advance directive at this time - Information given) - No - patient declined information - - Yes - Educational materials given No - patient declined information    Current Medications (verified) Outpatient Encounter Medications as of 08/27/2019  Medication Sig  . amLODipine (NORVASC) 10 MG tablet Take 1 tablet (10 mg total) by mouth daily. Must have office visit for refills  . aspirin EC 81 MG tablet Take 1 tablet (81 mg total) by mouth daily.  Marland Kitchen atorvastatin (LIPITOR) 80 MG tablet Take 1 tablet (80 mg total) by mouth daily.  . Blood Pressure Monitor DEVI Use as directed to check home blood pressure 2-3 times a week  . Cholecalciferol (VITAMIN D) 2000 units tablet Take 1 tablet (2,000 Units total) by mouth daily.  . furosemide (LASIX) 40 MG tablet Take 1 tablet (40 mg total) by mouth daily.  Marland Kitchen gabapentin (NEURONTIN) 300 MG capsule Take 1 capsule (300 mg total) by mouth 2 (two) times daily. X 1 week then prn nerve sensations  . isosorbide mononitrate (IMDUR) 60 MG 24 hr tablet Take 1 tablet (60 mg total) by mouth daily.  Marland Kitchen lisinopril (ZESTRIL) 20 MG tablet Take 1 tablet (20 mg total) by mouth daily.  . metoprolol tartrate (LOPRESSOR) 25 MG tablet Take 1 tablet (25 mg total) by mouth 2 (two) times daily.  .  nitroGLYCERIN (NITROSTAT) 0.4 MG SL tablet Place 1 tablet (0.4 mg total) under the tongue every 5 (five) minutes as needed for chest pain.  . potassium chloride SA (KLOR-CON M20) 20 MEQ tablet Take 2 tablets (40 mEq total) by mouth daily.   No facility-administered encounter medications on file as of 08/27/2019.    Allergies (verified) Shellfish allergy   History: Past Medical History:  Diagnosis Date  . Atrial fibrillation with RVR ([place])    a. 12/2013 post-op CABG ->on amio.  Marland Kitchen CAD (coronary artery disease)    a. 12/2013 NSTEMI/Cath: 3VD;  b. 12/2013 CABG x 4: LIMA->LAD, VG->RI, VG->OM1->LPDA.  Marland Kitchen Chronic combined systolic and diastolic CHF (congestive heart failure) ([place])    a. 12/2013 Echo: EF 45%, Gr 2 DD.  Marland Kitchen CKD (chronic kidney disease), stage III   . Hypertension    a. Dx ~ 2000.  . Ischemic cardiomyopathy    a. 12/2013 Echo: EF 45%, Gr 2 DD, basal-mid inflat and inf AK, mild MR.  . Tobacco abuse    Past Surgical History:  Procedure Laterality Date  . CORONARY ARTERY BYPASS GRAFT N/A 01/01/2014   Procedure: CORONARY ARTERY BYPASS GRAFTING (CABG) times four using left internal mammary artery and right saphenous vein;  Surgeon: Gaye Pollack, MD;  Location: [facility];  Service: Open Heart Surgery;  Laterality: N/A;  . INTRAOPERATIVE TRANSESOPHAGEAL ECHOCARDIOGRAM N/A 01/01/2014   Procedure: INTRAOPERATIVE TRANSESOPHAGEAL ECHOCARDIOGRAM;  Surgeon: Gaye Pollack,  MD;  Location: [facility];  Service: Open Heart Surgery;  Laterality: N/A;  . LEFT HEART CATHETERIZATION WITH CORONARY ANGIOGRAM N/A 12/29/2013   Procedure: LEFT HEART CATHETERIZATION WITH CORONARY ANGIOGRAM;  Surgeon: Sinclair Grooms, MD;  Location: [facility] CATH LAB;  Service: Cardiovascular;  Laterality: N/A;  . none     Family History  Problem Relation Age of Onset  . Heart disease Mother 29  . Hypertension Father 24  . Asthma Father   . Diabetes Paternal Grandfather   . Cancer Neg Hx    Social History   Socioeconomic  History  . Marital status: Single    Spouse name: Not on file  . Number of children: 0  . Years of education: 60  . Highest education level: Not on file  Occupational History  . 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 Smoking status: Former Smoker    Packs/day: 0.50    Years: 20.00    Pack years: 10.00    Types: Cigarettes    Quit date: 12/24/2013    Years since quitting: 5.6  . Smokeless tobacco: Never Used  Substance and Sexual Activity  . Alcohol use: No    Alcohol/week: 0.0 standard drinks    Comment: socially, 1-2 x year  . Drug use: No  . Sexual activity: Yes    Birth control/protection: None  Other Topics Concern  . Not on file  Social History Narrative   Lives with fiance.   From [place], originally.   Moved to [place] in 2012.    Social Determinants of Health   Financial Resource Strain:   . Difficulty of Paying Living Expenses:   Food Insecurity:   . Worried About Charity fundraiser in the Last Year:   . Arboriculturist in the Last Year:   Transportation Needs:   . Film/video editor (Medical):   Marland Kitchen Lack of Transportation (Non-Medical):   Physical Activity:   . Days of Exercise per Week:   . Minutes of Exercise per Session:   Stress:   . Feeling of Stress :   Social Connections:   . Frequency of Communication with Friends and Family:   . Frequency of Social Gatherings with Friends and Family:   . Attends Religious Services:   . Active Member of Clubs or Organizations:   . Attends Archivist Meetings:   Marland Kitchen Marital Status:    Tobacco Counseling Counseling given: Yes  Clinical Intake:  Pre-visit preparation completed: No  Pain : No/denies pain Pain Score: 0-No pain  Nutritional Status: BMI > 30  Obese Nutritional Risks: None Diabetes: No  How often do you need to have someone help you when you read instructions, pamphlets, or other written materials from  your doctor or pharmacy?: 1 - Never  Diabetic? no  Interpreter Needed?: No  Activities of Daily Living In your present state of health, do you have any difficulty performing the following activities: 08/27/2019  Hearing? N  Vision? N  Comment Sees an eye doctor; no issues with current prescription  Difficulty concentrating or making decisions? N  Walking or climbing stairs? Y  Comment DUE  Dressing or bathing? N  Doing errands, shopping? N  Preparing Food and eating ? N  Using the Toilet? N  In the past six months, have you accidently leaked urine? N  Do you have problems with loss of bowel control? N  Managing your  Medications? N  Managing your Finances? N  Housekeeping or managing your Housekeeping? N  Some recent data might be hidden    Patient Care Team: Ladell Pier, MD as PCP - General (Internal Medicine) Burnell Blanks, MD as PCP - Cardiology (Cardiology)  Indicate any recent Medical Services you may have received from other than [facility] providers in the past year (date may be approximate).     Assessment:   This is a routine wellness examination for Seth Keller.  Dietary issues and exercise activities discussed: Current Exercise Habits: The patient does not participate in regular exercise at present, Exercise limited by: cardiac condition(s)  Goals    . Blood Pressure < 140/90      Depression Screen PHQ 2/9 Scores 08/27/2019 07/06/2019 12/08/2018 10/25/2017 03/11/2017 08/21/2016 12/22/2015  PHQ - 2 Score 0 0 0 1 3 0 3  PHQ- 9 Score 0 1 - - 5 1 5     Fall Risk Fall Risk  08/27/2019 01/29/2019 12/08/2018 10/25/2017 03/11/2017  Falls in the past year? 0 0 0 No No  Number falls in past yr: 0 - - - -  Injury with Fall? 0 - - - -  Risk for fall due to : Impaired mobility - - - -  Follow up Education provided - - - -   Any stairs in or around the home? Yes  If so, are there any without handrails? No  Home free of loose throw rugs in walkways, pet beds, electrical  cords, etc? No  Adequate lighting in your home to reduce risk of falls? Yes   ASSISTIVE DEVICES UTILIZED TO PREVENT FALLS:  Life alert? No  Use of a cane, walker or w/c? No  Grab bars in the bathroom? No  Shower chair or bench in shower? No  Elevated toilet seat or a handicapped toilet? No   TIMED UP AND GO:  Was the test performed? Yes .  Length of time to ambulate 10 feet: <5 sec.   Gait steady and fast with assistive device  Cognitive Function: MMSE - Mini Mental State Exam 08/27/2019  Orientation to time 5  Orientation to Place 5  Registration 3  Attention/ Calculation 5  Recall 3  Language- name 2 objects 2  Language- repeat 1  Language- follow 3 step command 3  Language- read & follow direction 1  Write a sentence 1  Copy design 1  Total score 30        Immunizations Immunization History  Administered Date(s) Administered  . Influenza,inj,Quad PF,6+ Mos 01/11/2014  . Tdap 01/11/2014   TDAP status: Up to date Pneumococcal vaccine status: Declined,  Education has been provided regarding the importance of this vaccine but patient still declined. Advised may receive this vaccine at local pharmacy or Health Dept. Aware to provide a copy of the vaccination record if obtained from local pharmacy or Health Dept. Verbalized acceptance and understanding.  Covid-19 vaccine status: Declined, Education has been provided regarding the importance of this vaccine but patient still declined. Advised may receive this vaccine at local pharmacy or Health Dept.or vaccine clinic. Aware to provide a copy of the vaccination record if obtained from local pharmacy or Health Dept. Verbalized acceptance and understanding.  Qualifies for Shingles Vaccine? Yes   Zostavax completed No   Shingrix Completed?: No.    Education has been provided regarding the importance of this vaccine. Patient has been advised to call insurance company to determine out of pocket expense if they have not yet  received this vaccine. Advised may also receive vaccine at local pharmacy or Health Dept. Verbalized acceptance and understanding.  Screening Tests Health Maintenance  Topic Date Due  . Hepatitis C Screening  Never done  . COVID-19 Vaccine (1) Never done  . INFLUENZA VACCINE  09/20/2019  . COLONOSCOPY  04/11/2020  . TETANUS/TDAP  01/12/2024  . HIV Screening  Completed   Health Maintenance  Health Maintenance Due  Topic Date Due  . Hepatitis C Screening  Never done  . COVID-19 Vaccine (1) Never done   Colorectal cancer screening: Completed 04/11/2018. Repeat every 2 years  Lung Cancer Screening: (Low Dose CT Chest recommended if Age 32-80 years, 30 pack-year currently smoking OR have quit w/in 15years.) does not qualify.   Lung Cancer Screening Referral: does not qualify  Additional Screening:  Hepatitis C Screening: does qualify; Not completed.  Vision Screening: Recommended annual ophthalmology exams for early detection of glaucoma and other disorders of the eye. Is the patient up to date with their annual eye exam?  Yes  Who is the provider or what is the name of the office in which the patient attends annual eye exams? Pt could not recall the name of the practice or doctor but reports that he has a visit several months ago.  Dental Screening: Recommended annual dental exams for proper oral hygiene  Community Resource Referral / Chronic Care Management: CRR required this visit?  No   CCM required this visit?  No      Plan:     I have personally reviewed and noted the following in the patient's chart:   . Medical and social history . Use of alcohol, tobacco or illicit drugs  . Current medications and supplements . Functional ability and status . Nutritional status . Physical activity . Advanced directives . List of other physicians . Hospitalizations, surgeries, and ER visits in previous 12 months . Vitals . Screenings to include cognitive, depression, and  falls . Referrals and appointments  In addition, I have reviewed and discussed with patient certain preventive protocols, quality metrics, and best practice recommendations. A written personalized care plan for preventive services as well as general preventive health recommendations were provided to patient.     [place], RPH-CPP   08/27/2019

## 2019-10-25 ENCOUNTER — Other Ambulatory Visit: Payer: Self-pay | Admitting: Internal Medicine

## 2019-10-25 DIAGNOSIS — I10 Essential (primary) hypertension: Secondary | ICD-10-CM

## 2019-10-25 NOTE — Telephone Encounter (Signed)
Requested Prescriptions  Pending Prescriptions Disp Refills  . amLODipine (NORVASC) 10 MG tablet [Pharmacy Med Name: amLODIPine Besylate 10 MG Oral Tablet] 90 tablet 0    Sig: TAKE 1 TABLET BY MOUTH ONCE DAILY -  MUST  HAVE  OFFICE  VISIT  FOR  REFILLS     Cardiovascular:  Calcium Channel Blockers Passed - 10/25/2019  1:26 PM      Passed - Last BP in normal range    BP Readings from Last 1 Encounters:  08/27/19 122/76         Passed - Valid encounter within last 6 months    Recent Outpatient Visits          1 month ago Encounter for [employer] annual wellness exam   [place], Jarome Matin, RPH-CPP   3 months ago Essential hypertension   [place], MD   8 months ago [place], Eckrich M, [place]   10 months ago Essential hypertension   [place] Ladell Pier, MD   1 year ago Essential hypertension   [place] Ladell Pier, MD

## 2019-12-25 ENCOUNTER — Other Ambulatory Visit: Payer: Self-pay | Admitting: Internal Medicine

## 2019-12-25 DIAGNOSIS — I10 Essential (primary) hypertension: Secondary | ICD-10-CM

## 2020-01-09 ENCOUNTER — Other Ambulatory Visit: Payer: Self-pay | Admitting: Internal Medicine

## 2020-01-09 DIAGNOSIS — I255 Ischemic cardiomyopathy: Secondary | ICD-10-CM

## 2020-01-09 DIAGNOSIS — I25708 Atherosclerosis of coronary artery bypass graft(s), unspecified, with other forms of angina pectoris: Secondary | ICD-10-CM

## 2020-01-21 ENCOUNTER — Other Ambulatory Visit: Payer: Self-pay | Admitting: Internal Medicine

## 2020-01-21 DIAGNOSIS — I10 Essential (primary) hypertension: Secondary | ICD-10-CM

## 2020-01-21 DIAGNOSIS — I255 Ischemic cardiomyopathy: Secondary | ICD-10-CM

## 2020-01-21 NOTE — Telephone Encounter (Signed)
Requested Prescriptions  Pending Prescriptions Disp Refills  . furosemide (LASIX) 40 MG tablet [Pharmacy Med Name: Furosemide 40 MG Oral Tablet] 90 tablet 0    Sig: Take 1 tablet by mouth once daily     Cardiovascular:  Diuretics - Loop Failed - 01/21/2020 10:22 AM      Failed - Cr in normal range and within 360 days    Creat  Date Value Ref Range Status  06/16/2015 1.88 (H) 0.70 - 1.33 mg/dL Final   Creatinine, Ser  Date Value Ref Range Status  07/06/2019 2.74 (H) 0.76 - 1.27 mg/dL Final         Failed - Valid encounter within last 6 months    Recent Outpatient Visits          4 months ago Encounter for [employer] annual wellness exam   [place], Jarome Matin, RPH-CPP   6 months ago Essential hypertension   [place], Deborah B, MD   11 months ago [place], [place], [place]   1 year ago Essential hypertension   [place], Deborah B, MD   1 year ago Essential hypertension   [place], MD      Future Appointments            In 1 month Ladell Pier, MD [place] - K in normal range and within 360 days    Potassium  Date Value Ref Range Status  07/06/2019 4.5 3.5 - 5.2 mmol/L Final         Passed - Ca in normal range and within 360 days    Calcium  Date Value Ref Range Status  07/06/2019 9.2 8.7 - 10.2 mg/dL Final   Calcium, Ion  Date Value Ref Range Status  01/02/2014 1.23 1.12 - 1.23 mmol/L Final         Passed - Na in normal range and within 360 days    Sodium  Date Value Ref Range Status  07/06/2019 142 134 - 144 mmol/L Final         Passed - Last BP in normal range    BP Readings from Last 1 Encounters:  08/27/19 122/76         . amLODipine (NORVASC) 10 MG tablet [Pharmacy  Med Name: amLODIPine Besylate 10 MG Oral Tablet] 90 tablet 0    Sig: Take one tablet by mouth daily.     Cardiovascular:  Calcium Channel Blockers Failed - 01/21/2020 10:22 AM      Failed - Valid encounter within last 6 months    Recent Outpatient Visits          4 months ago Encounter for Medicare annual wellness exam   [place], RPH-CPP   6 months ago Essential hypertension   [place], MD   11 months ago [place], Dionne Bucy, [place]   1 year ago Essential hypertension   [place], MD   1 year ago Essential hypertension   [place], MD      Future Appointments  In 1 month Ladell Pier, MD [place] BP in normal range    BP Readings from Last 1 Encounters:  08/27/19 122/76          Phone call to pt.  Scheduled 6 mo. F/u appt. 03/18/20 (PCP's next available on date that works for pt.)  Will refill Amlodipine per protocol.

## 2020-02-24 ENCOUNTER — Encounter: Payer: Self-pay | Admitting: Internal Medicine

## 2020-02-24 NOTE — Progress Notes (Signed)
Patient seen by nephrologist Dr. Justin Mend on 02/03/2020.  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  Creatinine was 3.21, GFR 24.  Hemoglobin 12.4.

## 2020-03-09 DIAGNOSIS — N2581 Secondary hyperparathyroidism of renal origin: Secondary | ICD-10-CM | POA: Diagnosis not present

## 2020-03-09 DIAGNOSIS — D631 Anemia in chronic kidney disease: Secondary | ICD-10-CM | POA: Diagnosis not present

## 2020-03-09 DIAGNOSIS — N189 Chronic kidney disease, unspecified: Secondary | ICD-10-CM | POA: Diagnosis not present

## 2020-03-09 DIAGNOSIS — N184 Chronic kidney disease, stage 4 (severe): Secondary | ICD-10-CM | POA: Diagnosis not present

## 2020-03-09 DIAGNOSIS — R809 Proteinuria, unspecified: Secondary | ICD-10-CM | POA: Diagnosis not present

## 2020-03-09 DIAGNOSIS — I255 Ischemic cardiomyopathy: Secondary | ICD-10-CM | POA: Diagnosis not present

## 2020-03-09 DIAGNOSIS — I129 Hypertensive chronic kidney disease with stage 1 through stage 4 chronic kidney disease, or unspecified chronic kidney disease: Secondary | ICD-10-CM | POA: Diagnosis not present

## 2020-03-09 DIAGNOSIS — I509 Heart failure, unspecified: Secondary | ICD-10-CM | POA: Diagnosis not present

## 2020-03-09 DIAGNOSIS — R7302 Impaired glucose tolerance (oral): Secondary | ICD-10-CM | POA: Diagnosis not present

## 2020-03-16 ENCOUNTER — Encounter: Payer: Self-pay | Admitting: Internal Medicine

## 2020-03-16 NOTE — Progress Notes (Signed)
Received note from patient's nephrologist Dr. Justin Mend.  He was seen 03/09/2020.  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  Currently displays stable renal function.  BUN 32, creatinine 4.31, GFR 17.  ACE inhibitor was stopped 01/2020. Anemia of renal disease.  Hemoglobin 12.4. Patient referred to vascular surgery for AV fistula placement. Referred to transplantation [facility].

## 2020-03-18 ENCOUNTER — Other Ambulatory Visit: Payer: Self-pay

## 2020-03-18 ENCOUNTER — Ambulatory Visit: Payer: Medicare Other | Attending: Internal Medicine | Admitting: Internal Medicine

## 2020-03-18 ENCOUNTER — Encounter: Payer: Self-pay | Admitting: Internal Medicine

## 2020-03-18 VITALS — BP 124/82 | HR 77 | Temp 98.5°F | Resp 16 | Wt 267.6 lb

## 2020-03-18 DIAGNOSIS — N2581 Secondary hyperparathyroidism of renal origin: Secondary | ICD-10-CM

## 2020-03-18 DIAGNOSIS — Z6837 Body mass index (BMI) 37.0-37.9, adult: Secondary | ICD-10-CM

## 2020-03-18 DIAGNOSIS — N184 Chronic kidney disease, stage 4 (severe): Secondary | ICD-10-CM | POA: Diagnosis not present

## 2020-03-18 DIAGNOSIS — I5042 Chronic combined systolic (congestive) and diastolic (congestive) heart failure: Secondary | ICD-10-CM

## 2020-03-18 DIAGNOSIS — I495 Sick sinus syndrome: Secondary | ICD-10-CM

## 2020-03-18 DIAGNOSIS — Z2821 Immunization not carried out because of patient refusal: Secondary | ICD-10-CM

## 2020-03-18 DIAGNOSIS — I10 Essential (primary) hypertension: Secondary | ICD-10-CM

## 2020-03-18 DIAGNOSIS — I2510 Atherosclerotic heart disease of native coronary artery without angina pectoris: Secondary | ICD-10-CM

## 2020-03-18 DIAGNOSIS — Z6838 Body mass index (BMI) 38.0-38.9, adult: Secondary | ICD-10-CM | POA: Insufficient documentation

## 2020-03-18 MED ORDER — ASPIRIN EC 81 MG PO TBEC: 81.0000 mg | DELAYED_RELEASE_TABLET | Freq: Every day | ORAL | 3 refills | Status: DC

## 2020-03-18 MED ORDER — ISOSORBIDE MONONITRATE ER 60 MG PO TB24: 60.0000 mg | ORAL_TABLET | Freq: Every day | ORAL | 3 refills | Status: DC

## 2020-03-18 MED ORDER — AMLODIPINE BESYLATE 10 MG PO TABS
ORAL_TABLET | ORAL | 1 refills | Status: DC
Start: 2020-03-18 — End: 2020-10-26

## 2020-03-18 MED ORDER — FUROSEMIDE 40 MG PO TABS
40.0000 mg | ORAL_TABLET | Freq: Every day | ORAL | 1 refills | Status: DC
Start: 2020-03-18 — End: 2020-10-27

## 2020-03-18 MED ORDER — METOPROLOL TARTRATE 25 MG PO TABS: 25.0000 mg | ORAL_TABLET | Freq: Two times a day (BID) | ORAL | 3 refills | Status: DC

## 2020-03-18 MED ORDER — ATORVASTATIN CALCIUM 80 MG PO TABS: 80.0000 mg | ORAL_TABLET | Freq: Every day | ORAL | 1 refills | Status: DC

## 2020-03-18 NOTE — Progress Notes (Signed)
Patient ID: Seth Keller, male    DOB: 02/16/1966  MRN: [id number]  CC: Hypertension and Medication Refill   Subjective: Seth Keller is a 55 y.o. male who presents for chronic ds management.  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 Justin Mend), obesity.  CKD 4:  Received note from patient's nephrologist Dr. Justin Mend.  He was seen 03/09/2020.  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  Currently displays stable renal function.  BUN 32, creatinine 4.31, GFR 17.  ACE inhibitor was stopped 01/2020.  Patient has secondary hyperparathyroidism associated with chronic kidney disease. Anemia of renal disease.  Hemoglobin 12.4. Patient referred to vascular surgery for AV fistula placement. Referred to transplantation [facility].  HYPERTENSION/CAD/HL Currently taking: see medication list.  Lisinopril d/c 1 mth about.  Took meds already this a.m Med Adherence: '[x]'$  Yes    '[]'$  No Medication side effects: '[]'$  Yes    '[x]'$  No Adherence with salt restriction: '[x]'$  Yes but had pizza yesterday    '[]'$  No Home Monitoring?: '[x]'$  Yes    '[]'$  No Monitoring Frequency: '[x]'$  Yes once a day    '[]'$  No Home BP results range: last reading was 130/80 SOB? '[]'$  Yes    '[x]'$  No Chest Pain?: '[]'$  Yes    '[x]'$  No Leg swelling?: '[]'$  Yes    '[x]'$  No Headaches?: '[]'$  Yes    '[x]'$  No Dizziness? '[]'$  Yes    '[x]'$  No Comments: Requesting whether he still needs K+ supplement or whether pills can be smaller.  Pills are too large and he has problems swallowing them.  Obesity: Reports 3 lb wgh loss in past few wks.  Gets 300-500 steps in a day.  Plans to start walking at park again once weather brakes.  He has decreased portion sizes.  He stopped drinking sodas.  HM:  Decline flu shot.  Declines COVID19 vaccine. Last AWV 08/2019  Patient Active Problem  List   Diagnosis Date Noted  . Influenza vaccine refused 03/18/2020  . Class 2 severe obesity due to excess calories with serious comorbidity and body mass index (BMI) of 38.0 to 38.9 in adult [facility]) 03/18/2020  . Tachy-brady syndrome ([place]) 07/06/2019  . Bradycardia 09/25/2018  . Colon polyps 08/14/2018  . Anemia of chronic renal failure 06/04/2018  . Secondary hyperparathyroidism of renal origin ([place]) 06/04/2018  . Chronic bilateral low back pain without sciatica 12/22/2015  . Obesity (BMI 30-39.9) 06/16/2015  . SOB (shortness of breath) 09/27/2014  . Left shoulder pain 09/08/2014  . Coronary artery disease of bypass graft of native heart with stable angina pectoris ([place])   . Ischemic cardiomyopathy   . Chronic combined systolic and diastolic CHF (congestive heart failure) ([place])   . CKD (chronic kidney disease), stage III ([place])   . Tobacco abuse, in remission   . Constipation 01/11/2014  . S/P CABG x 4 01/01/2014  . Essential hypertension 05/08/2012  . Microhematuria 05/08/2012  . Proteinuria 05/08/2012     Current Outpatient Medications on File Prior to Visit  Medication Sig Dispense Refill  . Blood Pressure Monitor DEVI Use as directed to check home blood pressure 2-3 times a week 1 Device 0  . Cholecalciferol (VITAMIN D) 2000 units tablet Take 1 tablet (2,000 Units total) by mouth daily.  90 tablet 3  . gabapentin (NEURONTIN) 300 MG capsule Take 1 capsule (300 mg total) by mouth 2 (two) times daily. X 1 week then prn nerve sensations 60 capsule 0  . nitroGLYCERIN (NITROSTAT) 0.4 MG SL tablet Place 1 tablet (0.4 mg total) under the tongue every 5 (five) minutes as needed for chest pain. 90 tablet 3  . potassium chloride SA (KLOR-CON) 20 MEQ tablet Take 2 tablets by mouth once daily 180 tablet 0   No current facility-administered medications on file prior to visit.    Allergies  Allergen Reactions  . Shellfish Allergy Anaphylaxis    Throat close up, swelling    Social  History   Socioeconomic History  . Marital status: Single    Spouse name: Not on file  . Number of children: 0  . Years of education: 15  . Highest education level: Not on file  Occupational History  . 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 Smoking status: Former Smoker    Packs/day: 0.50    Years: 20.00    Pack years: 10.00    Types: Cigarettes    Quit date: 12/24/2013    Years since quitting: 6.2  . Smokeless tobacco: Never Used  Substance and Sexual Activity  . Alcohol use: No    Alcohol/week: 0.0 standard drinks    Comment: socially, 1-2 x year  . Drug use: No  . Sexual activity: Yes    Birth control/protection: None  Other Topics Concern  . Not on file  Social History Narrative   Lives with fiance.   From [place], originally.   Moved to [place] in 2012.    Social Determinants of Health   Financial Resource Strain: Not on file  Food Insecurity: Not on file  Transportation Needs: Not on file  Physical Activity: Not on file  Stress: Not on file  Social Connections: Not on file  Intimate Partner Violence: Not on file    Family History  Problem Relation Age of Onset  . Heart disease Mother 58  . Hypertension Father 72  . Asthma Father   . Diabetes Paternal Grandfather   . Cancer Neg Hx     Past Surgical History:  Procedure Laterality Date  . CORONARY ARTERY BYPASS GRAFT N/A 01/01/2014   Procedure: CORONARY ARTERY BYPASS GRAFTING (CABG) times four using left internal mammary artery and right saphenous vein;  Surgeon: Gaye Pollack, MD;  Location: [place] OR;  Service: Open Heart Surgery;  Laterality: N/A;  . INTRAOPERATIVE TRANSESOPHAGEAL ECHOCARDIOGRAM N/A 01/01/2014   Procedure: INTRAOPERATIVE TRANSESOPHAGEAL ECHOCARDIOGRAM;  Surgeon: Gaye Pollack, MD;  Location: [place] [facility];  Service: Open Heart Surgery;  Laterality: N/A;  . LEFT HEART CATHETERIZATION WITH CORONARY ANGIOGRAM N/A 12/29/2013    Procedure: LEFT HEART CATHETERIZATION WITH CORONARY ANGIOGRAM;  Surgeon: Sinclair Grooms, MD;  [facility] C[facility] LAB;  Service: Cardiovascular;  Laterality: N/A;  . none      ROS: Review of Systems Negative except as stated above  PHYSICAL EXAM: BP 124/82   Pulse 77   Temp 98.5 F (36.9 C)   Resp 16   Wt 267 lb 9.6 oz (121.4 kg)   SpO2 95%   BMI 37.32 kg/m   Wt Readings from Last 3 Encounters:  03/18/20 267 lb 9.6 oz (121.4 kg)  08/27/19 264 lb (119.7 kg)  07/06/19 264 lb (119.7 kg)    Physical Exam General appearance - alert, well  appearing, middle-aged African-American male and in no distress Mental status - normal mood, behavior, speech, dress, motor activity, and thought processes Neck - supple, no significant adenopathy Chest - clear to auscultation, no wheezes, rales or rhonchi, symmetric air entry Heart - normal rate, regular rhythm, normal S1, S2, no murmurs, rubs, clicks or gallops Extremities - peripheral pulses normal, no pedal edema, no clubbing or cyanosis  CMP Latest Ref Rng & Units 07/06/2019 10/02/2018 08/13/2018  Glucose 65 - 99 mg/dL 103(H) 131(H) 128(H)  BUN 6 - 24 mg/dL 26(H) 17 22  Creatinine 0.76 - 1.27 mg/dL 2.74(H) 2.14(H) 2.41(H)  Sodium 134 - 144 mmol/L 142 142 143  Potassium 3.5 - 5.2 mmol/L 4.5 4.1 4.1  Chloride 96 - 106 mmol/L 106 105 104  CO2 20 - 29 mmol/L '21 23 21  '$ Calcium 8.7 - 10.2 mg/dL 9.2 8.8 9.7  Total Protein 6.0 - 8.5 g/dL - 6.7 -  Total Bilirubin 0.0 - 1.2 mg/dL - 0.3 -  Alkaline Phos 39 - 117 IU/L - 92 -  AST 0 - 40 IU/L - 14 -  ALT 0 - 44 IU/L - 14 -   Lipid Panel     Component Value Date/Time   CHOL 135 10/02/2018 1015   TRIG 107 10/02/2018 1015   HDL 29 (L) 10/02/2018 1015   CHOLHDL 4.7 10/02/2018 1015   CHOLHDL 4.9 04/14/2015 0943   VLDL 22 04/14/2015 0943   LDLCALC 85 10/02/2018 1015   LDLDIRECT 198 (H) 05/29/2012 1744    CBC    Component Value Date/Time   WBC 7.3 10/02/2018 1015   WBC 8.0 08/16/2014 1147    RBC 4.79 10/02/2018 1015   RBC 4.97 08/16/2014 1147   HGB 13.1 10/02/2018 1015   HCT 39.8 10/02/2018 1015   PLT 236 10/02/2018 1015   MCV 83 10/02/2018 1015   MCH 27.3 10/02/2018 1015   MCH 27.6 08/16/2014 1147   MCHC 32.9 10/02/2018 1015   MCHC 33.5 08/16/2014 1147   RDW 13.6 10/02/2018 1015   LYMPHSABS 3.5 12/31/2013 0315   MONOABS 1.1 (H) 12/31/2013 0315   EOSABS 0.3 12/31/2013 0315   BASOSABS 0.0 12/31/2013 0315    ASSESSMENT AND PLAN:  1. Essential hypertension Close to goal of 130/80 or lower.  Continue current medications.  Strongly encouraged low-salt diet. - amLODipine (NORVASC) 10 MG tablet; Take one tablet by mouth daily.  Dispense: 90 tablet; Refill: 1 - aspirin EC 81 MG tablet; Take 1 tablet (81 mg total) by mouth daily.  Dispense: 90 tablet; Refill: 3 - metoprolol tartrate (LOPRESSOR) 25 MG tablet; Take 1 tablet (25 mg total) by mouth 2 (two) times daily.  Dispense: 180 tablet; Refill: 3  2. Class 2 severe obesity due to excess calories with serious comorbidity and body mass index (BMI) of 37.0 to 37.9 in adult [facility]) Healthy eating habits discussed.  Encourage exercise as tolerated.  Printed information given on healthy eating habits.  3. Coronary artery disease involving native coronary artery of native heart without angina pectoris Stable.  He is due for follow-up with his cardiologist.  I will submit a referral for him.  Continue aspirin, atorvastatin and beta-blocker. - Lipid panel - Hepatic function panel - aspirin EC 81 MG tablet; Take 1 tablet (81 mg total) by mouth daily.  Dispense: 90 tablet; Refill: 3 - atorvastatin (LIPITOR) 80 MG tablet; Take 1 tablet (80 mg total) by mouth daily.  Dispense: 90 tablet; Refill: 1 - metoprolol tartrate (LOPRESSOR) 25 MG tablet; Take  1 tablet (25 mg total) by mouth 2 (two) times daily.  Dispense: 180 tablet; Refill: 3 - isosorbide mononitrate (IMDUR) 60 MG 24 hr tablet; Take 1 tablet (60 mg total) by mouth daily.   Dispense: 90 tablet; Refill: 3  4. Chronic combined systolic and diastolic CHF (congestive heart failure) (HCC) Stable and compensated.  Continue current medications We will try changing the potassium to liquid form. - furosemide (LASIX) 40 MG tablet; Take 1 tablet (40 mg total) by mouth daily.  Dispense: 90 tablet; Refill: 1  5. CKD (chronic kidney disease) stage 4, GFR 15-29 ml/min (HCC) Followed by nephrology - CBC - Potassium  6. Secondary hyperparathyroidism, renal ([place]) Followed by nephrology  7. Tachy-brady syndrome (H[facility]) No further episodes.  Had Holter monitor done in August 2020 that came back okay.  8. Influenza vaccine refused Recommended.  9. COVID-19 vaccination declined Strongly recommended that he gets the COVID-19 vaccine.  He is at risk for poor outcome if he becomes infected.  Patient still declines.  10. Coronary artery disease involving native coronary artery of native heart without angina pectoris - Lipid panel - Hepatic function panel - aspirin EC 81 MG tablet; Take 1 tablet (81 mg total) by mouth daily.  Dispense: 90 tablet; Refill: 3 - atorvastatin (LIPITOR) 80 MG tablet; Take 1 tablet (80 mg total) by mouth daily.  Dispense: 90 tablet; Refill: 1 - metoprolol tartrate (LOPRESSOR) 25 MG tablet; Take 1 tablet (25 mg total) by mouth 2 (two) times daily.  Dispense: 180 tablet; Refill: 3 - isosorbide mononitrate (IMDUR) 60 MG 24 hr tablet; Take 1 tablet (60 mg total) by mouth daily.  Dispense: 90 tablet; Refill: 3    Patient was given the opportunity to ask questions.  Patient verbalized understanding of the plan and was able to repeat key elements of the plan.   Orders Placed This Encounter  Procedures  . Lipid panel  . CBC  . Hepatic function panel  . Potassium     Requested Prescriptions   Signed Prescriptions Disp Refills  . furosemide (LASIX) 40 MG tablet 90 tablet 1    Sig: Take 1 tablet (40 mg total) by mouth daily.  Marland Kitchen amLODipine (NORVASC)  10 MG tablet 90 tablet 1    Sig: Take one tablet by mouth daily.  Marland Kitchen aspirin EC 81 MG tablet 90 tablet 3    Sig: Take 1 tablet (81 mg total) by mouth daily.  Marland Kitchen atorvastatin (LIPITOR) 80 MG tablet 90 tablet 1    Sig: Take 1 tablet (80 mg total) by mouth daily.  . metoprolol tartrate (LOPRESSOR) 25 MG tablet 180 tablet 3    Sig: Take 1 tablet (25 mg total) by mouth 2 (two) times daily.  . isosorbide mononitrate (IMDUR) 60 MG 24 hr tablet 90 tablet 3    Sig: Take 1 tablet (60 mg total) by mouth daily.    Return in about 4 months (around 07/16/2020).  Karle Plumber, MD, FACP

## 2020-03-18 NOTE — Patient Instructions (Signed)
Healthy Eating Following a healthy eating pattern may help you to achieve and maintain a healthy body weight, reduce the risk of chronic disease, and live a long and productive life. It is important to follow a healthy eating pattern at an appropriate calorie level for your body. Your nutritional needs should be met primarily through food by choosing a variety of nutrient-rich foods. What are tips for following this plan? Reading food labels  Read labels and choose the following: ? Reduced or low sodium. ? Juices with 100% fruit juice. ? Foods with low saturated fats and high polyunsaturated and monounsaturated fats. ? Foods with whole grains, such as whole wheat, cracked wheat, brown rice, and wild rice. ? Whole grains that are fortified with folic acid. This is recommended for women who are pregnant or who want to become pregnant.  Read labels and avoid the following: ? Foods with a lot of added sugars. These include foods that contain brown sugar, corn sweetener, corn syrup, dextrose, fructose, glucose, high-fructose corn syrup, honey, invert sugar, lactose, malt syrup, maltose, molasses, raw sugar, sucrose, trehalose, or turbinado sugar.  Do not eat more than the following amounts of added sugar per day:  6 teaspoons (25 g) for women.  9 teaspoons (38 g) for men. ? Foods that contain processed or refined starches and grains. ? Refined grain products, such as white flour, degermed cornmeal, white bread, and white rice. Shopping  Choose nutrient-rich snacks, such as vegetables, whole fruits, and nuts. Avoid high-calorie and high-sugar snacks, such as potato chips, fruit snacks, and candy.  Use oil-based dressings and spreads on foods instead of solid fats such as butter, stick margarine, or cream cheese.  Limit pre-made sauces, mixes, and "instant" products such as flavored rice, instant noodles, and ready-made pasta.  Try more plant-protein sources, such as tofu, tempeh, black beans,  edamame, lentils, nuts, and seeds.  Explore eating plans such as the Mediterranean diet or vegetarian diet. Cooking  Use oil to saut or stir-fry foods instead of solid fats such as butter, stick margarine, or lard.  Try baking, boiling, grilling, or broiling instead of frying.  Remove the fatty part of meats before cooking.  Steam vegetables in water or broth. Meal planning  At meals, imagine dividing your plate into fourths: ? One-half of your plate is fruits and vegetables. ? One-fourth of your plate is whole grains. ? One-fourth of your plate is protein, especially lean meats, poultry, eggs, tofu, beans, or nuts.  Include low-fat dairy as part of your daily diet.   Lifestyle  Choose healthy options in all settings, including home, work, school, restaurants, or stores.  Prepare your food safely: ? Wash your hands after handling raw meats. ? Keep food preparation surfaces clean by regularly washing with hot, soapy water. ? Keep raw meats separate from ready-to-eat foods, such as fruits and vegetables. ? Cook seafood, meat, poultry, and eggs to the recommended internal temperature. ? Store foods at safe temperatures. In general:  Keep cold foods at 7F (4.4C) or below.  Keep hot foods at 17F (60C) or above.  Keep your freezer at [facility] (-17.8C) or below.  Foods are no longer safe to eat when they have been between the temperatures of 40-17F (4.4-60C) for more than 2 hours. What foods should I eat? Fruits Aim to eat 2 cup-equivalents of fresh, canned (in natural juice), or frozen fruits each day. Examples of 1 cup-equivalent of fruit include 1 small apple, 8 large strawberries, 1 cup canned fruit,  cup dried fruit, or 1 cup 100% juice. Vegetables Aim to eat 2-3 cup-equivalents of fresh and frozen vegetables each day, including different varieties and colors. Examples of 1 cup-equivalent of vegetables include 2 medium carrots, 2 cups raw, leafy greens, 1 cup chopped  vegetable (raw or cooked), or 1 medium baked potato. Grains Aim to eat 6 ounce-equivalents of whole grains each day. Examples of 1 ounce-equivalent of grains include 1 slice of bread, 1 cup ready-to-eat cereal, 3 cups popcorn, or  cup cooked rice, pasta, or cereal. Meats and other proteins Aim to eat 5-6 ounce-equivalents of protein each day. Examples of 1 ounce-equivalent of protein include 1 egg, 1/2 cup nuts or seeds, or 1 tablespoon (16 g) peanut butter. A cut of meat or fish that is the size of a deck of cards is about 3-4 ounce-equivalents.  Of the protein you eat each week, try to have at least 8 ounces come from seafood. This includes salmon, trout, herring, and anchovies. Dairy Aim to eat 3 cup-equivalents of fat-free or low-fat dairy each day. Examples of 1 cup-equivalent of dairy include 1 cup (240 mL) milk, 8 ounces (250 g) yogurt, 1 ounces (44 g) natural cheese, or 1 cup (240 mL) fortified soy milk. Fats and oils  Aim for about 5 teaspoons (21 g) per day. Choose monounsaturated fats, such as canola and olive oils, avocados, peanut butter, and most nuts, or polyunsaturated fats, such as sunflower, corn, and soybean oils, walnuts, pine nuts, sesame seeds, sunflower seeds, and flaxseed. Beverages  Aim for six 8-oz glasses of water per day. Limit coffee to three to five 8-oz cups per day.  Limit caffeinated beverages that have added calories, such as soda and energy drinks.  Limit alcohol intake to no more than 1 drink a day for nonpregnant women and 2 drinks a day for men. One drink equals 12 oz of beer (355 mL), 5 oz of wine (148 mL), or 1 oz of hard liquor (44 mL). Seasoning and other foods  Avoid adding excess amounts of salt to your foods. Try flavoring foods with herbs and spices instead of salt.  Avoid adding sugar to foods.  Try using oil-based dressings, sauces, and spreads instead of solid fats. This information is based on general [place] nutrition guidelines. For more  information, visit choosemyplate.gov. Exact amounts may vary based on your nutrition needs. Summary  A healthy eating plan may help you to maintain a healthy weight, reduce the risk of chronic diseases, and stay active throughout your life.  Plan your meals. Make sure you eat the right portions of a variety of nutrient-rich foods.  Try baking, boiling, grilling, or broiling instead of frying.  Choose healthy options in all settings, including home, work, school, restaurants, or stores. This information is not intended to replace advice given to you by your health care provider. Make sure you discuss any questions you have with your health care provider. Document Revised: 05/20/2017 Document Reviewed: 05/20/2017 Elsevier Patient Education  2021 [employer].  

## 2020-03-19 LAB — POTASSIUM: Potassium: 4.8 mmol/L (ref 3.5–5.2)

## 2020-03-19 LAB — CBC
Hematocrit: 38.5 % (ref 37.5–51.0)
Hemoglobin: 12.6 g/dL — ABNORMAL LOW (ref 13.0–17.7)
MCH: 27.4 pg (ref 26.6–33.0)
MCHC: 32.7 g/dL (ref 31.5–35.7)
MCV: 84 fL (ref 79–97)
Platelets: 289 10*3/uL (ref 150–450)
RBC: 4.6 x10E6/uL (ref 4.14–5.80)
RDW: 14.5 % (ref 11.6–15.4)
WBC: 8.8 10*3/uL (ref 3.4–10.8)

## 2020-03-19 LAB — LIPID PANEL
Chol/HDL Ratio: 5.5 ratio — ABNORMAL HIGH (ref 0.0–5.0)
Cholesterol, Total: 160 mg/dL (ref 100–199)
HDL: 29 mg/dL — ABNORMAL LOW (ref >39)
LDL Chol Calc (NIH): 106 mg/dL — ABNORMAL HIGH (ref 0–99)
Triglycerides: 136 mg/dL (ref 0–149)
VLDL Cholesterol Cal: 25 mg/dL (ref 5–40)

## 2020-03-19 LAB — HEPATIC FUNCTION PANEL
ALT: 24 IU/L (ref 0–44)
AST: 14 IU/L (ref 0–40)
Albumin: 3.8 g/dL (ref 3.8–4.9)
Alkaline Phosphatase: 108 IU/L (ref 44–121)
Bilirubin Total: 0.2 mg/dL (ref 0.0–1.2)
Bilirubin, Direct: <0.1 mg/dL (ref 0.00–0.40)
Total Protein: 6.9 g/dL (ref 6.0–8.5)

## 2020-03-20 ENCOUNTER — Other Ambulatory Visit: Payer: Self-pay | Admitting: Internal Medicine

## 2020-03-20 DIAGNOSIS — I2510 Atherosclerotic heart disease of native coronary artery without angina pectoris: Secondary | ICD-10-CM

## 2020-04-29 ENCOUNTER — Other Ambulatory Visit: Payer: Self-pay | Admitting: *Deleted

## 2020-04-29 DIAGNOSIS — N183 Chronic kidney disease, stage 3 unspecified: Secondary | ICD-10-CM

## 2020-05-03 ENCOUNTER — Other Ambulatory Visit: Payer: Self-pay

## 2020-05-03 ENCOUNTER — Ambulatory Visit (HOSPITAL_COMMUNITY)
Admission: RE | Admit: 2020-05-03 | Discharge: 2020-05-03 | Disposition: A | Discharge status: Home / Self Care | Payer: Medicare Other | Source: Ambulatory Visit | Attending: Vascular Surgery | Admitting: Vascular Surgery

## 2020-05-03 ENCOUNTER — Ambulatory Visit (INDEPENDENT_AMBULATORY_CARE_PROVIDER_SITE_OTHER): Payer: Medicare Other | Admitting: Vascular Surgery

## 2020-05-03 ENCOUNTER — Encounter: Payer: Self-pay | Admitting: Vascular Surgery

## 2020-05-03 ENCOUNTER — Ambulatory Visit (INDEPENDENT_AMBULATORY_CARE_PROVIDER_SITE_OTHER)
Admission: RE | Admit: 2020-05-03 | Discharge: 2020-05-03 | Disposition: A | Discharge status: Home / Self Care | Payer: Medicare Other | Source: Ambulatory Visit | Attending: Vascular Surgery | Admitting: Vascular Surgery

## 2020-05-03 VITALS — BP 147/87 | HR 63 | Temp 97.7°F | Resp 16 | Ht 68.0 in | Wt 262.8 lb

## 2020-05-03 DIAGNOSIS — N183 Chronic kidney disease, stage 3 unspecified: Secondary | ICD-10-CM | POA: Diagnosis not present

## 2020-05-03 DIAGNOSIS — N184 Chronic kidney disease, stage 4 (severe): Secondary | ICD-10-CM | POA: Diagnosis not present

## 2020-05-03 NOTE — Progress Notes (Signed)
[facility] [place]  ASSESSMENT / PLAN: Seth Keller is a 55 y.o. left handed male in need of permanent hemodialysis access. I reviewed options for dialysis in detail with the patient. I counseled the patient that dialysis access requires surveillance and periodic maintenance. Plan to proceed with right radiocephalic arteriovenous fistula creation. I offered him next available appointment. He prefers 05/16/20.   CHIEF COMPLAINT: CKD IV in need of HD access  HISTORY OF PRESENT ILLNESS: Seth Keller is a 55 y.o. male referred to the clinic for evaluation of deteriorating renal function.  He is a left-handed gentleman.  He reports no history of pacemaker or central venous catheter placement.  No history of trauma or surgery to the upper extremities.  Past Medical History:  Diagnosis Date  . Atrial fibrillation with RVR ([place])    a. 12/2013 post-op CABG ->on amio.  Marland Kitchen CAD (coronary artery disease)    a. 12/2013 NSTEMI/Cath: 3VD;  b. 12/2013 CABG x 4: LIMA->LAD, VG->RI, VG->OM1->LPDA.  Marland Kitchen Chronic combined systolic and diastolic CHF (congestive heart failure) ([place])    a. 12/2013 Echo: EF 45%, Gr 2 DD.  Marland Kitchen CKD (chronic kidney disease), stage III ([place])   . Hypertension    a. Dx ~ 2000.  . Ischemic cardiomyopathy    a. 12/2013 Echo: EF 45%, Gr 2 DD, basal-mid inflat and inf AK, mild MR.  . Tobacco abuse     Past Surgical History:  Procedure Laterality Date  . CORONARY ARTERY BYPASS GRAFT N/A 01/01/2014   Procedure: CORONARY ARTERY BYPASS GRAFTING (CABG) times four using left internal mammary artery and right saphenous vein;  Surgeon: Gaye Pollack, MD;  Location: [place] [facility];  Service: Open Heart Surgery;  Laterality: N/A;  . INTRAOPERATIVE TRANSESOPHAGEAL ECHOCARDIOGRAM N/A 01/01/2014   Procedure: INTRAOPERATIVE TRANSESOPHAGEAL ECHOCARDIOGRAM;  Surgeon: Gaye Pollack, MD;  Location: [place] [facility];  Service: Open Heart Surgery;  Laterality: N/A;  . LEFT HEART CATHETERIZATION WITH CORONARY  ANGIOGRAM N/A 12/29/2013   Procedure: LEFT HEART CATHETERIZATION WITH CORONARY ANGIOGRAM;  Surgeon: Sinclair Grooms, MD;  Location: [facility] C[facility] LAB;  Service: Cardiovascular;  Laterality: N/A;  . none      Family History  Problem Relation Age of Onset  . Heart disease Mother 61  . Hypertension Father 89  . Asthma Father   . Diabetes Paternal Grandfather   . Cancer Neg Hx     Social History   Socioeconomic History  . Marital status: Single    Spouse name: Not on file  . Number of children: 0  . Years of education: 50  . Highest education level: Not on file  Occupational History  . 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 Smoking status: Former Smoker    Packs/day: 0.50    Years: 20.00    Pack years: 10.00    Types: Cigarettes    Quit date: 12/24/2013    Years since quitting: 6.3  . Smokeless tobacco: Never Used  Substance and Sexual Activity  . Alcohol use: No    Alcohol/week: 0.0 standard drinks    Comment: socially, 1-2 x year  . Drug use: No  . Sexual activity: Yes    Birth control/protection: None  Other Topics Concern  . Not on file  Social History Narrative   Lives with fiance.   From [place], originally.   Moved to [place] in 2012.    Social Determinants of Health  Financial Resource Strain: Not on file  Food Insecurity: Not on file  Transportation Needs: Not on file  Physical Activity: Not on file  Stress: Not on file  Social Connections: Not on file  Intimate Partner Violence: Not on file    Allergies  Allergen Reactions  . Shellfish Allergy Anaphylaxis    Throat close up, swelling    Current Outpatient Medications  Medication Sig Dispense Refill  . amLODipine (NORVASC) 10 MG tablet Take one tablet by mouth daily. 90 tablet 1  . aspirin EC 81 MG tablet Take 1 tablet (81 mg total) by mouth daily. 90 tablet 3  . atorvastatin (LIPITOR) 80 MG tablet Take 1 tablet (80 mg  total) by mouth daily. 90 tablet 1  . Blood Pressure Monitor DEVI Use as directed to check home blood pressure 2-3 times a week 1 Device 0  . Cholecalciferol (VITAMIN D) 2000 units tablet Take 1 tablet (2,000 Units total) by mouth daily. 90 tablet 3  . furosemide (LASIX) 40 MG tablet Take 1 tablet (40 mg total) by mouth daily. 90 tablet 1  . isosorbide mononitrate (IMDUR) 60 MG 24 hr tablet Take 1 tablet (60 mg total) by mouth daily. 90 tablet 3  . metoprolol tartrate (LOPRESSOR) 25 MG tablet Take 1 tablet (25 mg total) by mouth 2 (two) times daily. 180 tablet 3  . nitroGLYCERIN (NITROSTAT) 0.4 MG SL tablet Place 1 tablet (0.4 mg total) under the tongue every 5 (five) minutes as needed for chest pain. 90 tablet 3  . potassium chloride SA (KLOR-CON) 20 MEQ tablet Take 2 tablets by mouth once daily 180 tablet 0  . gabapentin (NEURONTIN) 300 MG capsule Take 1 capsule (300 mg total) by mouth 2 (two) times daily. X 1 week then prn nerve sensations (Patient not taking: Reported on 05/03/2020) 60 capsule 0   No current facility-administered medications for this visit.    REVIEW OF SYSTEMS:  '[X]'$  denotes positive finding, '[ ]'$  denotes negative finding Cardiac  Comments:  Chest pain or chest pressure:    Shortness of breath upon exertion:    Short of breath when lying flat:    Irregular heart rhythm:        Vascular    Pain in calf, thigh, or hip brought on by ambulation:    Pain in feet at night that wakes you up from your sleep:     Blood clot in your veins:    Leg swelling:         Pulmonary    Oxygen at home:    Productive cough:     Wheezing:         Neurologic    Sudden weakness in arms or legs:     Sudden numbness in arms or legs:     Sudden onset of difficulty speaking or slurred speech:    Temporary loss of vision in one eye:     Problems with dizziness:         Gastrointestinal    Blood in stool:     Vomited blood:         Genitourinary    Burning when urinating:     Blood  in urine:        Psychiatric    Major depression:         Hematologic    Bleeding problems:    Problems with blood clotting too easily:        Skin    Rashes or ulcers:  Constitutional    Fever or chills:      PHYSICAL EXAM  Vitals:   05/03/20 1136  BP: (!) 147/87  Pulse: 63  Resp: 16  Temp: 97.7 F (36.5 C)  TempSrc: Temporal  SpO2: 97%  Weight: 262 lb 12.8 oz (119.2 kg)  Height: '5\' 8"'$  (1.727 m)    Constitutional: well appearing. no distress. Appears well nourished.  Neurologic: CN intact. no focal findings. no sensory loss. Psychiatric: Mood and affect symmetric and appropriate. Eyes: No icterus. No conjunctival pallor. Ears, nose, throat: mucous membranes moist. Midline trachea.  Cardiac: regular rate and rhythm.  Respiratory: unlabored. Abdominal: soft, non-tender, non-distended.  Peripheral vascular:  2+ R radial and ulnar pulse  Normal Allen's test Extremity: No edema. No cyanosis. No pallor.  Skin: No gangrene. No ulceration.  Lymphatic: No Stemmer's sign. No palpable lymphadenopathy.  PERTINENT LABORATORY AND RADIOLOGIC DATA  Most recent CBC CBC Latest Ref Rng & Units 03/18/2020 10/02/2018 08/16/2014  WBC 3.4 - 10.8 x10E3/uL 8.8 7.3 8.0  Hemoglobin 13.0 - 17.7 g/dL 12.6(L) 13.1 13.7  Hematocrit 37.5 - 51.0 % 38.5 39.8 40.9  Platelets 150 - 450 x10E3/uL 289 236 252     Most recent CMP CMP Latest Ref Rng & Units 03/18/2020 07/06/2019 10/02/2018  Glucose 65 - 99 mg/dL - 103(H) 131(H)  BUN 6 - 24 mg/dL - 26(H) 17  Creatinine 0.76 - 1.27 mg/dL - 2.74(H) 2.14(H)  Sodium 134 - 144 mmol/L - 142 142  Potassium 3.5 - 5.2 mmol/L 4.8 4.5 4.1  Chloride 96 - 106 mmol/L - 106 105  CO2 20 - 29 mmol/L - 21 23  Calcium 8.7 - 10.2 mg/dL - 9.2 8.8  Total Protein 6.0 - 8.5 g/dL 6.9 - 6.7  Total Bilirubin 0.0 - 1.2 mg/dL 0.2 - 0.3  Alkaline Phos 44 - 121 IU/L 108 - 92  AST 0 - 40 IU/L 14 - 14  ALT 0 - 44 IU/L 24 - 14    Renal function CrCl cannot be  calculated (Patient's most recent lab result is older than the maximum 21 days allowed.).  Hgb A1c MFr Bld (%)  Date Value  12/26/2013 5.7 (H)    LDL Chol Calc (NIH)  Date Value Ref Range Status  03/18/2020 106 (H) 0 - 99 mg/dL Final   Direct LDL  Date Value Ref Range Status  05/29/2012 198 (H) mg/dL Final    Comment:    ATP III Classification (LDL):       < 100        mg/dL         Optimal      100 - 129     mg/dL         Near or Above Optimal      130 - 159     mg/dL         Borderline High      160 - 189     mg/dL         High       > 190        mg/dL         Very High       Vascular Imaging: UPPER EXTREMITY VEIN MAPPING  Indications: Pre-access.   History: ESRD.  Performing Technologist: Ronal Fear RVS, RCS     Examination Guidelines: A complete evaluation includes B-mode imaging,  spectral  Doppler, color Doppler, and power Doppler as needed of all accessible  portions  of each  vessel. Bilateral testing is considered an integral part of a  complete  examination. Limited examinations for reoccurring indications may be  performed  as noted.   +-----------------+-------------+----------+--------+  Right Cephalic  Diameter (cm)Depth (cm)Findings  +-----------------+-------------+----------+--------+  Shoulder       0.46              +-----------------+-------------+----------+--------+  Prox upper arm    0.51              +-----------------+-------------+----------+--------+  Mid upper arm    0.51              +-----------------+-------------+----------+--------+  Dist upper arm    0.56              +-----------------+-------------+----------+--------+  Antecubital fossa  0.61              +-----------------+-------------+----------+--------+  Prox forearm     0.47              +-----------------+-------------+----------+--------+   Mid forearm     0.45              +-----------------+-------------+----------+--------+  Dist forearm     0.50              +-----------------+-------------+----------+--------+   +-----------------+-------------+----------+--------+  Right Basilic  Diameter (cm)Depth (cm)Findings  +-----------------+-------------+----------+--------+  Prox upper arm    0.64              +-----------------+-------------+----------+--------+  Mid upper arm    0.57              +-----------------+-------------+----------+--------+  Dist upper arm    0.69              +-----------------+-------------+----------+--------+  Antecubital fossa  0.63              +-----------------+-------------+----------+--------+   +-----------------+-------------+----------+--------+  Left Cephalic  Diameter (cm)Depth (cm)Findings  +-----------------+-------------+----------+--------+  Shoulder       0.39              +-----------------+-------------+----------+--------+  Prox upper arm    0.34              +-----------------+-------------+----------+--------+  Mid upper arm    0.38              +-----------------+-------------+----------+--------+  Dist upper arm    0.39              +-----------------+-------------+----------+--------+  Antecubital fossa  0.44              +-----------------+-------------+----------+--------+  Prox forearm     0.28              +-----------------+-------------+----------+--------+  Mid forearm     0.38              +-----------------+-------------+----------+--------+  Dist forearm     0.40              +-----------------+-------------+----------+--------+    +-----------------+-------------+----------+--------+  Left Basilic   Diameter (cm)Depth (cm)Findings  +-----------------+-------------+----------+--------+  Prox upper arm    0.66              +-----------------+-------------+----------+--------+  Mid upper arm    0.65              +-----------------+-------------+----------+--------+  Dist upper arm    0.73              +-----------------+-------------+----------+--------+  Antecubital fossa  0.56              +-----------------+-------------+----------+--------+   Summary: Right: Patent  and compressible cephalic and basilic veins.  Left: Patent and compressible cephalic and basilic veins.   *See table(s) above for measurements and observations.      Diagnosing physician: Cyndia Diver. Stanford Breed, MD [facility] Phone Number: [phone] 05/03/2020 12:02 PM

## 2020-05-06 ENCOUNTER — Other Ambulatory Visit: Payer: Self-pay | Admitting: Internal Medicine

## 2020-05-06 DIAGNOSIS — I255 Ischemic cardiomyopathy: Secondary | ICD-10-CM

## 2020-05-11 ENCOUNTER — Telehealth: Payer: Self-pay

## 2020-05-11 NOTE — Telephone Encounter (Signed)
Patient called requesting to reschedule his AVF surgical date from 05/16/20 to 3 months later due to a family emergency. Procedure moved to 08/15/20. Updated instruction letter to be mailed to pt.

## 2020-05-14 ENCOUNTER — Other Ambulatory Visit (HOSPITAL_COMMUNITY): Payer: Medicare Other

## 2020-06-13 DIAGNOSIS — I255 Ischemic cardiomyopathy: Secondary | ICD-10-CM | POA: Diagnosis not present

## 2020-06-13 DIAGNOSIS — R7302 Impaired glucose tolerance (oral): Secondary | ICD-10-CM | POA: Diagnosis not present

## 2020-06-13 DIAGNOSIS — I509 Heart failure, unspecified: Secondary | ICD-10-CM | POA: Diagnosis not present

## 2020-06-13 DIAGNOSIS — I129 Hypertensive chronic kidney disease with stage 1 through stage 4 chronic kidney disease, or unspecified chronic kidney disease: Secondary | ICD-10-CM | POA: Diagnosis not present

## 2020-06-13 DIAGNOSIS — N184 Chronic kidney disease, stage 4 (severe): Secondary | ICD-10-CM | POA: Diagnosis not present

## 2020-06-13 DIAGNOSIS — N2581 Secondary hyperparathyroidism of renal origin: Secondary | ICD-10-CM | POA: Diagnosis not present

## 2020-06-13 DIAGNOSIS — D631 Anemia in chronic kidney disease: Secondary | ICD-10-CM | POA: Diagnosis not present

## 2020-06-13 DIAGNOSIS — N189 Chronic kidney disease, unspecified: Secondary | ICD-10-CM | POA: Diagnosis not present

## 2020-06-13 DIAGNOSIS — R809 Proteinuria, unspecified: Secondary | ICD-10-CM | POA: Diagnosis not present

## 2020-06-24 ENCOUNTER — Other Ambulatory Visit: Payer: Self-pay

## 2020-06-24 ENCOUNTER — Encounter: Payer: Self-pay | Admitting: Cardiovascular Disease

## 2020-06-24 ENCOUNTER — Ambulatory Visit (INDEPENDENT_AMBULATORY_CARE_PROVIDER_SITE_OTHER): Payer: Medicare Other | Admitting: Cardiovascular Disease

## 2020-06-24 VITALS — BP 132/80 | HR 66 | Ht 68.0 in | Wt 262.0 lb

## 2020-06-24 DIAGNOSIS — I25118 Atherosclerotic heart disease of native coronary artery with other forms of angina pectoris: Secondary | ICD-10-CM

## 2020-06-24 DIAGNOSIS — I10 Essential (primary) hypertension: Secondary | ICD-10-CM | POA: Diagnosis not present

## 2020-06-24 DIAGNOSIS — I5042 Chronic combined systolic (congestive) and diastolic (congestive) heart failure: Secondary | ICD-10-CM

## 2020-06-24 DIAGNOSIS — I255 Ischemic cardiomyopathy: Secondary | ICD-10-CM

## 2020-06-24 NOTE — Progress Notes (Signed)
Chief Complaint  Patient presents with  . Follow-up    CAD    History of Present Illness: 55 yo male with history of CKD stage 4, CAD s/p 4V CABG November 2015, HTN, HLD, ischemic cardiomyopathy here today for cardiac follow up. He was admitted to [facility] with a NSTEMI in November 2015 and cardiac cath showed severe three vessel CAD. Echo showed LV dysfunction with LVEF of 45%. He underwent 4V CABG. Post-op course was complicated by atrial fibrillation and he was placed on amiodarone with conversion to sinus rhythm. Amiodarone was stopped at his office visit in February 2016.  Echo in July 2018 with LVEF=50-55% and no wall motion abnormalities. Nuclear stress test in 2018 with evidence of scar but no large areas of ischemia. Imdur added at office visit in February 2019 for NTG responsive chest pain occurring with moderate exertion.  He has stage 4 CKD and is followed by Dr. Justin Mend in Nephrology. Workup at [facility] for kidney transplant is underway. Echo at [facility] 06/08/20 with LVEF=60-65%, no valve disease.   He is here today for follow up. The patient denies any chest pain, dyspnea, palpitations, lower extremity edema, orthopnea, PND, dizziness, near syncope or syncope.   Primary Care Physician: Ladell Pier, MD  Past Medical History:  Diagnosis Date  . Atrial fibrillation with RVR ([place])    a. 12/2013 post-op CABG ->on amio.  Marland Kitchen CAD (coronary artery disease)    a. 12/2013 NSTEMI/Cath: 3VD;  b. 12/2013 CABG x 4: LIMA->LAD, VG->RI, VG->OM1->LPDA.  Marland Kitchen Chronic combined systolic and diastolic CHF (congestive heart failure) ([place])    a. 12/2013 Echo: EF 45%, Gr 2 DD.  Marland Kitchen CKD (chronic kidney disease), stage III ([place])   . Hypertension    a. Dx ~ 2000.  . Ischemic cardiomyopathy    a. 12/2013 Echo: EF 45%, Gr 2 DD, basal-mid inflat and inf AK, mild MR.  . Tobacco abuse     Past Surgical History:  Procedure Laterality Date  . CORONARY ARTERY BYPASS GRAFT N/A 01/01/2014    Procedure: CORONARY ARTERY BYPASS GRAFTING (CABG) times four using left internal mammary artery and right saphenous vein;  Surgeon: Gaye Pollack, MD;  Location: [place] OR;  Service: Open Heart Surgery;  Laterality: N/A;  . INTRAOPERATIVE TRANSESOPHAGEAL ECHOCARDIOGRAM N/A 01/01/2014   Procedure: INTRAOPERATIVE TRANSESOPHAGEAL ECHOCARDIOGRAM;  Surgeon: Gaye Pollack, MD;  Location: [place] [facility];  Service: Open Heart Surgery;  Laterality: N/A;  . LEFT HEART CATHETERIZATION WITH CORONARY ANGIOGRAM N/A 12/29/2013   Procedure: LEFT HEART CATHETERIZATION WITH CORONARY ANGIOGRAM;  Surgeon: Sinclair Grooms, MD;  Location: [facility] CATH LAB;  Service: Cardiovascular;  Laterality: N/A;  . none      Current Outpatient Medications  Medication Sig Dispense Refill  . amLODipine (NORVASC) 10 MG tablet Take one tablet by mouth daily. 90 tablet 1  . aspirin EC 81 MG tablet Take 1 tablet (81 mg total) by mouth daily. 90 tablet 3  . atorvastatin (LIPITOR) 80 MG tablet Take 1 tablet (80 mg total) by mouth daily. 90 tablet 1  . Blood Pressure Monitor DEVI Use as directed to check home blood pressure 2-3 times a week 1 Device 0  . Cholecalciferol (VITAMIN D) 2000 units tablet Take 1 tablet (2,000 Units total) by mouth daily. 90 tablet 3  . furosemide (LASIX) 40 MG tablet Take 1 tablet (40 mg total) by mouth daily. 90 tablet 1  . isosorbide mononitrate (IMDUR) 60 MG 24 hr tablet Take 1  tablet (60 mg total) by mouth daily. 90 tablet 3  . metoprolol tartrate (LOPRESSOR) 25 MG tablet Take 1 tablet (25 mg total) by mouth 2 (two) times daily. (Patient taking differently: Take 50 mg by mouth daily.) 180 tablet 3  . nitroGLYCERIN (NITROSTAT) 0.4 MG SL tablet Place 1 tablet (0.4 mg total) under the tongue every 5 (five) minutes as needed for chest pain. 90 tablet 3  . OVER THE COUNTER MEDICATION Take 1 tablet by mouth daily. Total Beets otc supplement    . potassium chloride SA (KLOR-CON) 20 MEQ tablet Take 2 tablets by mouth once daily  180 tablet 0   No current facility-administered medications for this visit.    Allergies  Allergen Reactions  . Shellfish Allergy Anaphylaxis    Throat close up, swelling    Social History   Socioeconomic History  . Marital status: Single    Spouse name: Not on file  . Number of children: 0  . Years of education: 8  . Highest education level: Not on file  Occupational History  . 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 Smoking status: Former Smoker    Packs/day: 0.50    Years: 20.00    Pack years: 10.00    Types: Cigarettes    Quit date: 12/24/2013    Years since quitting: 6.5  . Smokeless tobacco: Never Used  Substance and Sexual Activity  . Alcohol use: No    Alcohol/week: 0.0 standard drinks    Comment: socially, 1-2 x year  . Drug use: No  . Sexual activity: Yes    Birth control/protection: None  Other Topics Concern  . Not on file  Social History Narrative   Lives with fiance.   From [place], originally.   Moved to [place] in 2012.    Social Determinants of Health   Financial Resource Strain: Not on file  Food Insecurity: Not on file  Transportation Needs: Not on file  Physical Activity: Not on file  Stress: Not on file  Social Connections: Not on file  Intimate Partner Violence: Not on file    Family History  Problem Relation Age of Onset  . Heart disease Mother 20  . Hypertension Father 55  . Asthma Father   . Diabetes Paternal Grandfather   . Cancer Neg Hx     Review of Systems:  As stated in the HPI and otherwise negative.   BP 132/80   Pulse 66   Ht '5\' 8"'$  (1.727 m)   Wt 262 lb (118.8 kg)   SpO2 98%   BMI 39.84 kg/m   Physical Examination:  General: Well developed, well nourished, NAD  HEENT: OP clear, mucus membranes moist  SKIN: warm, dry. No rashes. Neuro: No focal deficits  Musculoskeletal: Muscle strength 5/5 all ext  Psychiatric: Mood and affect  normal  Neck: No JVD, no carotid bruits, no thyromegaly, no lymphadenopathy.  Lungs:Clear bilaterally, no wheezes, rhonci, crackles Cardiovascular: Regular rate and rhythm. No murmurs, gallops or rubs. Abdomen:Soft. Bowel sounds present. Non-tender.  Extremities: No lower extremity edema. Pulses are 2 + in the bilateral DP/PT.  Echo July 2018: - Left ventricle: The cavity size was normal. Wall thickness was   increased in a pattern of mild LVH. Systolic function was normal.   The estimated ejection fraction was in the range of 50% to 55%.   Wall motion was normal; there were no regional wall motion  abnormalities. Features are consistent with a pseudonormal left   ventricular filling pattern, with concomitant abnormal relaxation   and increased filling pressure (grade 2 diastolic dysfunction).  Echo [facility] 06/08/20: LVEF 65% No valve disease Moderate LVH  Cardiac cath 12/29/13: The left main coronary artery is widely patent. The left anterior descending artery is large and wraps around the left ventricular apex. There is a proximal eccentric 70% stenosis and a mid eccentric 60-70% stenosis. FFR beyond the mid vessel stenosis was 0.78. The apical LAD contains 90% segmental stenosis before the apical segment. The first diagonal is totally occluded. The left circumflex artery is severely and diffusely diseased. The mid circumflex contains an eccentric 80% stenosis. There is total occlusion of a moderate sized first obtuse marginal that arises beyond an 80% stenosis in the mid vessel. The distal vessel is diffusely diseased with up to 85% stenosis before the margin of the second obtuse marginal and the PDA.Marland Kitchen The ramus intermedius is moderate in size and contains proximal to mid diffuse disease with up to 80% stenosis. The distal vessel may not be graftable. The right coronary artery is nondominant. The right coronary is totally occluded in the mid-segment.  EKG:  EKG is ordered today. The  ekg ordered today demonstrates Sinus  Recent Labs: 07/06/2019: BUN 26; Creatinine, Ser 2.74; Sodium 142 03/18/2020: ALT 24; Hemoglobin 12.6; Platelets 289; Potassium 4.8   Lipid Panel    Component Value Date/Time   CHOL 160 03/18/2020 1019   TRIG 136 03/18/2020 1019   HDL 29 (L) 03/18/2020 1019   CHOLHDL 5.5 (H) 03/18/2020 1019   CHOLHDL 4.9 04/14/2015 0943   VLDL 22 04/14/2015 0943   LDLCALC 106 (H) 03/18/2020 1019   LDLDIRECT 198 (H) 05/29/2012 1744     Wt Readings from Last 3 Encounters:  06/24/20 262 lb (118.8 kg)  05/03/20 262 lb 12.8 oz (119.2 kg)  03/18/20 267 lb 9.6 oz (121.4 kg)     Other studies Reviewed: Additional studies/ records that were reviewed today include:. Review of the above records demonstrates:   Assessment and Plan:   1. CAD s/p CABG with angina: No recent chest pain. Will continue ASA, statin, beta blocker and Imdur.    2. Ischemic cardiomyopathy: LVEF 60-65% by echo 4/20/222 at [facility].    3. HTN: BP is well controlled. Continue current therapy  4. Tobacco abuse, in remission  5. Chronic systolic CHF: Weight stable. No evidence of volume overload. Will continue Lasix.   6. CKD, stage 4: He is being followed closely by Dr. Justin Mend and is undergoing workup at [facility] for a kidney transplant.   Current medicines are reviewed at length with the patient today.  The patient does not have concerns regarding medicines.  The following changes have been made:  no change  Labs/ tests ordered today include:   Orders Placed This Encounter  Procedures  . EKG 12-Lead    Disposition:   FU with me  in 12 months  Signed, Lauree Chandler, MD 06/24/2020 12:46 PM    [facility] [address], [place], [place]  [address] Phone: [phone]; Fax: [phone]

## 2020-06-24 NOTE — Patient Instructions (Signed)
Medication Instructions:  Your physician recommends that you continue on your current medications as directed. Please refer to the Current Medication list given to you today.  *If you need a refill on your cardiac medications before your next appointment, please call your pharmacy*   Lab Work: None If you have labs (blood work) drawn today and your tests are completely normal, you will receive your results only by: Marland Kitchen MyChart Message (if you have MyChart) OR . A paper copy in the mail If you have any lab test that is abnormal or we need to change your treatment, we will call you to review the results.   Testing/Procedures: None   Follow-Up: At [facility], you and your health needs are our priority.  As part of our continuing mission to provide you with exceptional heart care, we have created designated Provider Care Teams.  These Care Teams include your primary Cardiologist (physician) and Advanced Practice Providers (APPs -  Physician Assistants and Nurse Practitioners) who all work together to provide you with the care you need, when you need it.  We recommend signing up for the patient portal called "MyChart".  Sign up information is provided on this After Visit Summary.  MyChart is used to connect with patients for Virtual Visits (Telemedicine).  Patients are able to view lab/test results, encounter notes, upcoming appointments, etc.  Non-urgent messages can be sent to your provider as well.   To learn more about what you can do with MyChart, go to [email or web address].    Your next appointment:   1 year(s)  The format for your next appointment:   In Person  Provider:   You may see Lauree Chandler, MD or one of the following Advanced Practice Providers on your designated Care Team:    Melina Copa, PA-C  Ermalinda Barrios, PA-C    Other Instructions

## 2020-06-27 DIAGNOSIS — N184 Chronic kidney disease, stage 4 (severe): Secondary | ICD-10-CM | POA: Diagnosis not present

## 2020-07-14 ENCOUNTER — Telehealth: Payer: Self-pay | Admitting: *Deleted

## 2020-07-14 DIAGNOSIS — R7302 Impaired glucose tolerance (oral): Secondary | ICD-10-CM | POA: Diagnosis not present

## 2020-07-14 DIAGNOSIS — I255 Ischemic cardiomyopathy: Secondary | ICD-10-CM | POA: Diagnosis not present

## 2020-07-14 DIAGNOSIS — I120 Hypertensive chronic kidney disease with stage 5 chronic kidney disease or end stage renal disease: Secondary | ICD-10-CM | POA: Diagnosis not present

## 2020-07-14 DIAGNOSIS — N185 Chronic kidney disease, stage 5: Secondary | ICD-10-CM | POA: Diagnosis not present

## 2020-07-14 DIAGNOSIS — I509 Heart failure, unspecified: Secondary | ICD-10-CM | POA: Diagnosis not present

## 2020-07-14 DIAGNOSIS — R809 Proteinuria, unspecified: Secondary | ICD-10-CM | POA: Diagnosis not present

## 2020-07-14 DIAGNOSIS — N189 Chronic kidney disease, unspecified: Secondary | ICD-10-CM | POA: Diagnosis not present

## 2020-07-14 DIAGNOSIS — D631 Anemia in chronic kidney disease: Secondary | ICD-10-CM | POA: Diagnosis not present

## 2020-07-14 DIAGNOSIS — N2581 Secondary hyperparathyroidism of renal origin: Secondary | ICD-10-CM | POA: Diagnosis not present

## 2020-07-14 NOTE — Telephone Encounter (Signed)
Spoke with Museum/gallery conservator at [employer] She requested that the patient get a tunneled dialysis catheter inserted at the same time as AVF posting. Will have the [facility] added to the procedure.

## 2020-08-12 ENCOUNTER — Other Ambulatory Visit (HOSPITAL_COMMUNITY)
Admission: RE | Admit: 2020-08-12 | Discharge: 2020-08-12 | Disposition: A | Discharge status: Home / Self Care | Payer: Medicare Other | Source: Ambulatory Visit | Attending: Vascular Surgery | Admitting: Vascular Surgery

## 2020-08-12 ENCOUNTER — Encounter (HOSPITAL_COMMUNITY): Payer: Self-pay | Admitting: Vascular Surgery

## 2020-08-12 ENCOUNTER — Other Ambulatory Visit: Payer: Self-pay

## 2020-08-12 DIAGNOSIS — Z20822 Contact with and (suspected) exposure to covid-19: Secondary | ICD-10-CM | POA: Diagnosis not present

## 2020-08-12 DIAGNOSIS — Z01812 Encounter for preprocedural laboratory examination: Secondary | ICD-10-CM | POA: Diagnosis not present

## 2020-08-12 LAB — SARS CORONAVIRUS 2 (TAT 6-24 HRS): SARS Coronavirus 2: NEGATIVE

## 2020-08-12 NOTE — Progress Notes (Signed)
DUE TO COVID-19 ONLY ONE VISITOR IS ALLOWED TO COME WITH YOU AND STAY IN THE WAITING ROOM ONLY DURING PRE OP AND PROCEDURE DAY OF SURGERY. Two  VISITORS  MAY VISIT WITH YOU AFTER SURGERY IN YOUR PRIVATE ROOM DURING VISITING HOURS ONLY!   Patient denies shortness of breath, fever, cough or chest pain.  PCP - Dr. Karle Plumber Cardiologist - Dr. Angelena Form  Chest x-ray - 08/04/20 EKG - 07/04/20 Stress Test - 10/08/16 ECHO - 06/08/20 Cardiac Cath - 01/03/14  Sleep Study -  Denies  DM - Denies   Aspirin Instructions: Follow your surgeon's instructions on when to stop aspirin prior to surgery,  If no instructions were given by your surgeon then you will need to call the office for those instructions.  Anesthesia review: Yes cardiac history  STOP now taking any Aspirin (unless otherwise instructed by your surgeon), Aleve, Naproxen, Ibuprofen, Motrin, Advil, Goody's, BC's, all herbal medications, fish oil, and all vitamins.    Covid test is scheduled on 08/12/20   Patient verbalized understanding of instructions that were given to them.

## 2020-08-12 NOTE — Progress Notes (Signed)
Anesthesia Chart Review: Same day workup  Follows with cardiology for history of CAD s/p four-vessel CABG November 2015, HTN, HLD, ischemic cardiomyopathy. Nuclear stress test in 2018 with evidence of scar but no large areas of ischemia. He has stage 4 CKD and is followed by Dr. Justin Mend in Nephrology. Workup at [facility] for kidney transplant is underway. Echo at [facility] 06/08/20 with LVEF=60-65%, no valve disease.  Last seen by cardiologist Dr. Angelena Form 06/24/2020.  Per note, stable from cardiovascular standpoint, no changes to management.  He will need day of surgery labs and evaluation.  EKG 06/24/2020: Sinus rhythm.  Rate 66.  TTE 06/08/2020 (Care Everywhere): SUMMARY  The left ventricular size is normal.  Moderate left ventricular hypertrophy  LV ejection fraction = 60-65%.  The right ventricle is normal size.  The right ventricular systolic function is normal.  The left atrium is mildly to moderately dilated.  Estimated right ventricular systolic pressure is 29 mmHg.  Estimated right atrial pressure is 5 mmHg.Marland Kitchen  There is no significant valvular stenosis or regurgitation.  There is no pericardial effusion.  There is no comparison study available.   Nuclear stress 10/05/2016: Nuclear stress EF: 45%. There was no ST segment deviation noted during stress. Defect 1: There is a large defect of severe severity present in the basal inferolateral and mid inferolateral location. Defect 2: There is a small defect of moderate severity present in the basal anterior location.   Intermediate risk stress nuclear study with large prior inferolateral infarct; small anterobasal infarct with minimal peri-infact ischemia; EF 45 with akinesis of the inferolateral wall and mild LVE   Seth Keller [facility] Short Stay Center/Anesthesiology Phone [phone] 08/12/2020 2:09 PM

## 2020-08-12 NOTE — Anesthesia Preprocedure Evaluation (Addendum)
Anesthesia Evaluation  Patient identified by MRN, date of birth, ID band Patient awake    Reviewed: Allergy & Precautions, NPO status , Patient's Chart, lab work & pertinent test results  Airway Mallampati: II  TM Distance: >3 FB Neck ROM: Full    Dental no notable dental hx.    Pulmonary shortness of breath, former smoker,    Pulmonary exam normal breath sounds clear to auscultation       Cardiovascular hypertension, Pt. on medications + angina + CAD, + Past MI and +CHF  Normal cardiovascular exam Rhythm:Regular Rate:Normal     Neuro/Psych negative neurological ROS  negative psych ROS   GI/Hepatic negative GI ROS, Neg liver ROS,   Endo/Other  negative endocrine ROS  Renal/GU ESRF and DialysisRenal disease  negative genitourinary   Musculoskeletal negative musculoskeletal ROS (+)   Abdominal (+) + obese,   Peds negative pediatric ROS (+)  Hematology negative hematology ROS (+)   Anesthesia Other Findings   Reproductive/Obstetrics negative OB ROS                            Anesthesia Physical Anesthesia Plan  ASA: 4  Anesthesia Plan: General   Post-op Pain Management:    Induction: Intravenous  PONV Risk Score and Plan: 2 and Ondansetron, Midazolam and Treatment may vary due to age or medical condition  Airway Management Planned: LMA  Additional Equipment:   Intra-op Plan:   Post-operative Plan: Extubation in OR  Informed Consent: I have reviewed the patients History and Physical, chart, labs and discussed the procedure including the risks, benefits and alternatives for the proposed anesthesia with the patient or authorized representative who has indicated his/her understanding and acceptance.     Dental advisory given  Plan Discussed with: CRNA  Anesthesia Plan Comments: (PAT note by Karoline Caldwell, PA-C: Follows with cardiology for history of CAD s/p four-vessel CABG  November 2015, HTN, HLD, ischemic cardiomyopathy. Nuclear stress test in 2018 with evidence of scar but no large areas of ischemia.He has stage 4 CKD and is followed by Dr. Justin Mend in Nephrology. Workup at [facility] for kidney transplant is underway. Echo at [facility] 06/08/20 with LVEF=60-65%, no valve disease. Last seen by cardiologist Dr. Angelena Form 06/24/2020.  Per note, stable from cardiovascular standpoint, no changes to management.  He will need day of surgery labs and evaluation.  EKG 06/24/2020: Sinus rhythm.  Rate 66.  TTE 06/08/2020 (Care Everywhere): SUMMARY  The left ventricular size is normal.  Moderate left ventricular hypertrophy  LV ejection fraction = 60-65%.  The right ventricle is normal size.  The right ventricular systolic function is normal.  The left atrium is mildly to moderately dilated.  Estimated right ventricular systolic pressure is 29 mmHg.  Estimated right atrial pressure is 5 mmHg.Marland Kitchen  There is no significant valvular stenosis or regurgitation.  There is no pericardial effusion.  There is no comparison study available.   Nuclear stress 10/05/2016: . Nuclear stress EF: 45%. . There was no ST segment deviation noted during stress. . Defect 1: There is a large defect of severe severity present in the basal inferolateral and mid inferolateral location. . Defect 2: There is a small defect of moderate severity present in the basal anterior location.  Intermediate risk stress nuclear study with large prior inferolateral infarct; small anterobasal infarct with minimal peri-infact ischemia; EF 45 with akinesis of the inferolateral wall and mild LVE )  Anesthesia Quick Evaluation  

## 2020-08-12 NOTE — Progress Notes (Signed)
No orders in chart for patient's procedure on Monday 08/15/20.  Staff message sent to Dr. Jamelle Haring requesting orders.

## 2020-08-13 ENCOUNTER — Other Ambulatory Visit: Payer: Self-pay | Admitting: Internal Medicine

## 2020-08-13 DIAGNOSIS — I255 Ischemic cardiomyopathy: Secondary | ICD-10-CM

## 2020-08-13 NOTE — Telephone Encounter (Signed)
Requested medication (s) are due for refill today: yes  Requested medication (s) are on the active medication list: yes  Last refill:  05/06/20  Future visit scheduled: no  Notes to clinic:  overdue lab work (Cr)   Requested Prescriptions  Pending Prescriptions Disp Refills   potassium chloride SA (KLOR-CON) 20 MEQ tablet [Pharmacy Med Name: Potassium Chloride Crys ER 20 MEQ Oral Tablet Extended Release] 180 tablet 0    Sig: Take 2 tablets by mouth once daily      Endocrinology:  Minerals - Potassium Supplementation Failed - 08/13/2020  6:25 PM      Failed - Cr in normal range and within 360 days    Creat  Date Value Ref Range Status  06/16/2015 1.88 (H) 0.70 - 1.33 mg/dL Final   Creatinine, Ser  Date Value Ref Range Status  07/06/2019 2.74 (H) 0.76 - 1.27 mg/dL Final          Passed - K in normal range and within 360 days    Potassium  Date Value Ref Range Status  03/18/2020 4.8 3.5 - 5.2 mmol/L Final          Passed - Valid encounter within last 12 months    Recent Outpatient Visits           4 months ago Essential hypertension   [place], MD   11 months ago Encounter for [employer] annual wellness exam   [place], Jarome Matin, RPH-CPP   1 year ago Essential hypertension   [place], Deborah B, MD   1 year ago [place], [place], [place]   1 year ago Essential hypertension   [place], MD

## 2020-08-15 ENCOUNTER — Ambulatory Visit (HOSPITAL_COMMUNITY)
Admission: RE | Admit: 2020-08-15 | Discharge: 2020-08-15 | Disposition: A | Discharge status: Home / Self Care | Payer: Medicare Other | Attending: Vascular Surgery | Admitting: Vascular Surgery

## 2020-08-15 ENCOUNTER — Ambulatory Visit (HOSPITAL_COMMUNITY): Payer: Medicare Other | Admitting: Physician Assistant

## 2020-08-15 ENCOUNTER — Encounter (HOSPITAL_COMMUNITY)
Admission: RE | Disposition: A | Discharge status: Home / Self Care | Payer: Self-pay | Source: Home / Self Care | Attending: Vascular Surgery

## 2020-08-15 ENCOUNTER — Encounter (HOSPITAL_COMMUNITY): Payer: Self-pay | Admitting: Vascular Surgery

## 2020-08-15 DIAGNOSIS — I5042 Chronic combined systolic (congestive) and diastolic (congestive) heart failure: Secondary | ICD-10-CM | POA: Diagnosis not present

## 2020-08-15 DIAGNOSIS — I130 Hypertensive heart and chronic kidney disease with heart failure and stage 1 through stage 4 chronic kidney disease, or unspecified chronic kidney disease: Secondary | ICD-10-CM | POA: Insufficient documentation

## 2020-08-15 DIAGNOSIS — I132 Hypertensive heart and chronic kidney disease with heart failure and with stage 5 chronic kidney disease, or end stage renal disease: Secondary | ICD-10-CM | POA: Diagnosis not present

## 2020-08-15 DIAGNOSIS — N186 End stage renal disease: Secondary | ICD-10-CM | POA: Diagnosis not present

## 2020-08-15 DIAGNOSIS — Z951 Presence of aortocoronary bypass graft: Secondary | ICD-10-CM | POA: Diagnosis not present

## 2020-08-15 DIAGNOSIS — D631 Anemia in chronic kidney disease: Secondary | ICD-10-CM | POA: Diagnosis not present

## 2020-08-15 DIAGNOSIS — Z87891 Personal history of nicotine dependence: Secondary | ICD-10-CM | POA: Diagnosis not present

## 2020-08-15 DIAGNOSIS — N184 Chronic kidney disease, stage 4 (severe): Secondary | ICD-10-CM | POA: Diagnosis not present

## 2020-08-15 DIAGNOSIS — Z992 Dependence on renal dialysis: Secondary | ICD-10-CM | POA: Diagnosis not present

## 2020-08-15 HISTORY — PX: AV FISTULA PLACEMENT: SHX1204

## 2020-08-15 HISTORY — DX: Acute myocardial infarction, unspecified: I21.9

## 2020-08-15 HISTORY — DX: Dyspnea, unspecified: R06.00

## 2020-08-15 SURGERY — ARTERIOVENOUS (AV) FISTULA CREATION
Anesthesia: General | Laterality: Right

## 2020-08-15 MED ORDER — LIDOCAINE 2% (20 MG/ML) 5 ML SYRINGE
INTRAMUSCULAR | Status: AC
  Filled 2020-08-15: qty 5

## 2020-08-15 MED ORDER — PHENYLEPHRINE 40 MCG/ML (10ML) SYRINGE FOR IV PUSH (FOR BLOOD PRESSURE SUPPORT)
PREFILLED_SYRINGE | INTRAVENOUS | Status: DC | PRN
  Administered 2020-08-15 (×2): 80 ug via INTRAVENOUS

## 2020-08-15 MED ORDER — PROPOFOL 10 MG/ML IV BOLUS
INTRAVENOUS | Status: AC
  Filled 2020-08-15: qty 40

## 2020-08-15 MED ORDER — FENTANYL CITRATE (PF) 250 MCG/5ML IJ SOLN
INTRAMUSCULAR | Status: AC
  Filled 2020-08-15: qty 5

## 2020-08-15 MED ORDER — HEPARIN SODIUM (PORCINE) 1000 UNIT/ML IJ SOLN
INTRAMUSCULAR | Status: AC
  Filled 2020-08-15: qty 1

## 2020-08-15 MED ORDER — SODIUM CHLORIDE 0.9 % IV SOLN
INTRAVENOUS | Status: DC | PRN
  Administered 2020-08-15: 500 mL

## 2020-08-15 MED ORDER — CHLORHEXIDINE GLUCONATE 0.12 % MT SOLN: 15.0000 mL | Freq: Once | OROMUCOSAL | Status: AC

## 2020-08-15 MED ORDER — ORAL CARE MOUTH RINSE: 15.0000 mL | Freq: Once | OROMUCOSAL | Status: AC

## 2020-08-15 MED ORDER — HYDROMORPHONE HCL 1 MG/ML IJ SOLN: 0.2500 mg | INTRAMUSCULAR | Status: DC | PRN

## 2020-08-15 MED ORDER — SODIUM CHLORIDE 0.9 % IV SOLN
INTRAVENOUS | Status: AC
  Filled 2020-08-15: qty 1.2

## 2020-08-15 MED ORDER — CEFAZOLIN SODIUM-DEXTROSE 2-3 GM-%(50ML) IV SOLR
INTRAVENOUS | Status: DC | PRN
  Administered 2020-08-15: 2 g via INTRAVENOUS

## 2020-08-15 MED ORDER — PAPAVERINE HCL 30 MG/ML IJ SOLN
INTRAMUSCULAR | Status: AC
  Filled 2020-08-15: qty 2

## 2020-08-15 MED ORDER — EPHEDRINE SULFATE-NACL 50-0.9 MG/10ML-% IV SOSY
PREFILLED_SYRINGE | INTRAVENOUS | Status: DC | PRN
  Administered 2020-08-15: 10 mg via INTRAVENOUS

## 2020-08-15 MED ORDER — LIDOCAINE 2% (20 MG/ML) 5 ML SYRINGE
INTRAMUSCULAR | Status: DC | PRN
  Administered 2020-08-15: 60 mg via INTRAVENOUS

## 2020-08-15 MED ORDER — OXYCODONE-ACETAMINOPHEN 5-325 MG PO TABS: 1.0000 | ORAL_TABLET | Freq: Four times a day (QID) | ORAL | 0 refills | Status: DC | PRN

## 2020-08-15 MED ORDER — OXYCODONE HCL 5 MG PO TABS: 5.0000 mg | ORAL_TABLET | Freq: Once | ORAL | Status: DC | PRN

## 2020-08-15 MED ORDER — EPHEDRINE 5 MG/ML INJ
INTRAVENOUS | Status: AC
  Filled 2020-08-15: qty 10

## 2020-08-15 MED ORDER — CHLORHEXIDINE GLUCONATE 0.12 % MT SOLN
OROMUCOSAL | Status: AC
  Administered 2020-08-15: 15 mL via OROMUCOSAL
  Filled 2020-08-15: qty 15

## 2020-08-15 MED ORDER — PROMETHAZINE HCL 25 MG/ML IJ SOLN: 6.2500 mg | INTRAMUSCULAR | Status: DC | PRN

## 2020-08-15 MED ORDER — DEXAMETHASONE SODIUM PHOSPHATE 10 MG/ML IJ SOLN
INTRAMUSCULAR | Status: AC
  Filled 2020-08-15: qty 1

## 2020-08-15 MED ORDER — SODIUM CHLORIDE 0.9 % IV SOLN: INTRAVENOUS | Status: DC

## 2020-08-15 MED ORDER — AMISULPRIDE (ANTIEMETIC) 5 MG/2ML IV SOLN: 10.0000 mg | Freq: Once | INTRAVENOUS | Status: DC | PRN

## 2020-08-15 MED ORDER — MIDAZOLAM HCL 5 MG/5ML IJ SOLN
INTRAMUSCULAR | Status: DC | PRN
  Administered 2020-08-15: 2 mg via INTRAVENOUS

## 2020-08-15 MED ORDER — METOPROLOL TARTRATE 25 MG PO TABS
25.0000 mg | ORAL_TABLET | Freq: Once | ORAL | Status: AC
  Administered 2020-08-15: 25 mg via ORAL
  Filled 2020-08-15: qty 2
  Filled 2020-08-15: qty 1

## 2020-08-15 MED ORDER — 0.9 % SODIUM CHLORIDE (POUR BTL) OPTIME
TOPICAL | Status: DC | PRN
  Administered 2020-08-15: 1000 mL

## 2020-08-15 MED ORDER — METOPROLOL TARTRATE 12.5 MG HALF TABLET
ORAL_TABLET | ORAL | Status: AC
  Filled 2020-08-15: qty 1

## 2020-08-15 MED ORDER — OXYCODONE HCL 5 MG/5ML PO SOLN: 5.0000 mg | Freq: Once | ORAL | Status: DC | PRN

## 2020-08-15 MED ORDER — DEXAMETHASONE SODIUM PHOSPHATE 10 MG/ML IJ SOLN
INTRAMUSCULAR | Status: DC | PRN
  Administered 2020-08-15: 4 mg via INTRAVENOUS

## 2020-08-15 MED ORDER — ONDANSETRON HCL 4 MG/2ML IJ SOLN
INTRAMUSCULAR | Status: DC | PRN
  Administered 2020-08-15: 4 mg via INTRAVENOUS

## 2020-08-15 MED ORDER — MIDAZOLAM HCL 2 MG/2ML IJ SOLN
INTRAMUSCULAR | Status: AC
  Filled 2020-08-15: qty 2

## 2020-08-15 MED ORDER — ONDANSETRON HCL 4 MG/2ML IJ SOLN
INTRAMUSCULAR | Status: AC
  Filled 2020-08-15: qty 2

## 2020-08-15 MED ORDER — CEFAZOLIN SODIUM 1 G IJ SOLR
INTRAMUSCULAR | Status: AC
  Filled 2020-08-15: qty 20

## 2020-08-15 MED ORDER — FENTANYL CITRATE (PF) 250 MCG/5ML IJ SOLN
INTRAMUSCULAR | Status: DC | PRN
  Administered 2020-08-15 (×3): 50 ug via INTRAVENOUS

## 2020-08-15 MED ORDER — PROPOFOL 10 MG/ML IV BOLUS
INTRAVENOUS | Status: DC | PRN
  Administered 2020-08-15: 70 mg via INTRAVENOUS
  Administered 2020-08-15: 200 mg via INTRAVENOUS

## 2020-08-15 MED ORDER — PHENYLEPHRINE HCL-NACL 10-0.9 MG/250ML-% IV SOLN
INTRAVENOUS | Status: DC | PRN
  Administered 2020-08-15: 25 ug/min via INTRAVENOUS

## 2020-08-15 MED ORDER — LIDOCAINE-EPINEPHRINE 1 %-1:100000 IJ SOLN
INTRAMUSCULAR | Status: AC
  Filled 2020-08-15: qty 1

## 2020-08-15 SURGICAL SUPPLY — 57 items
ARMBAND PINK RESTRICT EXTREMIT (MISCELLANEOUS) ×4 IMPLANT
BAG DECANTER FOR FLEXI CONT (MISCELLANEOUS) ×4 IMPLANT
BENZOIN TINCTURE PRP APPL 2/3 (GAUZE/BANDAGES/DRESSINGS) IMPLANT
BIOPATCH RED 1 DISK 7.0 (GAUZE/BANDAGES/DRESSINGS) ×3 IMPLANT
BIOPATCH RED 1IN DISK 7.0MM (GAUZE/BANDAGES/DRESSINGS) ×1
CANISTER SUCT 3000ML PPV (MISCELLANEOUS) ×4 IMPLANT
CANNULA VESSEL 3MM 2 BLNT TIP (CANNULA) ×4 IMPLANT
CATH PALINDROME-P 19CM W/VT (CATHETERS) IMPLANT
CATH PALINDROME-P 23CM W/VT (CATHETERS) IMPLANT
CATH PALINDROME-P 28CM W/VT (CATHETERS) IMPLANT
CHLORAPREP W/TINT 26 (MISCELLANEOUS) ×4 IMPLANT
CLIP VESOCCLUDE MED 6/CT (CLIP) ×4 IMPLANT
CLIP VESOCCLUDE SM WIDE 6/CT (CLIP) ×4 IMPLANT
CLOSURE WOUND 1/2 X4 (GAUZE/BANDAGES/DRESSINGS)
COVER PROBE W GEL 5X96 (DRAPES) ×4 IMPLANT
COVER SURGICAL LIGHT HANDLE (MISCELLANEOUS) IMPLANT
DERMABOND ADHESIVE PROPEN (GAUZE/BANDAGES/DRESSINGS) ×2
DERMABOND ADVANCED .7 DNX6 (GAUZE/BANDAGES/DRESSINGS) ×2 IMPLANT
DRAPE C-ARM 42X72 X-RAY (DRAPES) IMPLANT
DRAPE CHEST BREAST 15X10 FENES (DRAPES) ×4 IMPLANT
DRAPE EXTREMITY T 121X128X90 (DISPOSABLE) IMPLANT
ELECT REM PT RETURN 9FT ADLT (ELECTROSURGICAL) ×4
ELECTRODE REM PT RTRN 9FT ADLT (ELECTROSURGICAL) ×2 IMPLANT
GAUZE 4X4 16PLY RFD (DISPOSABLE) IMPLANT
GAUZE SPONGE 4X4 12PLY STRL (GAUZE/BANDAGES/DRESSINGS) IMPLANT
GLOVE SURG POLYISO LF SZ8 (GLOVE) ×4 IMPLANT
GOWN STRL REUS W/ TWL LRG LVL3 (GOWN DISPOSABLE) ×6 IMPLANT
GOWN STRL REUS W/ TWL XL LVL3 (GOWN DISPOSABLE) IMPLANT
GOWN STRL REUS W/TWL LRG LVL3 (GOWN DISPOSABLE) ×6
GOWN STRL REUS W/TWL XL LVL3 (GOWN DISPOSABLE)
INSERT FOGARTY SM (MISCELLANEOUS) IMPLANT
KIT BASIN OR (CUSTOM PROCEDURE TRAY) ×4 IMPLANT
KIT PALINDROME-P 55CM (CATHETERS) IMPLANT
KIT TURNOVER KIT B (KITS) ×4 IMPLANT
NEEDLE 18GX1X1/2 (RX/OR ONLY) (NEEDLE) IMPLANT
NEEDLE HYPO 25GX1X1/2 BEV (NEEDLE) IMPLANT
NS IRRIG 1000ML POUR BTL (IV SOLUTION) ×4 IMPLANT
PACK CV ACCESS (CUSTOM PROCEDURE TRAY) ×4 IMPLANT
PACK SURGICAL SETUP 50X90 (CUSTOM PROCEDURE TRAY) IMPLANT
PAD ARMBOARD 7.5X6 YLW CONV (MISCELLANEOUS) ×8 IMPLANT
SET MICROPUNCTURE 5F STIFF (MISCELLANEOUS) IMPLANT
SOAP 2 % CHG 4 OZ (WOUND CARE) IMPLANT
STRIP CLOSURE SKIN 1/2X4 (GAUZE/BANDAGES/DRESSINGS) IMPLANT
SUT ETHILON 3 0 PS 1 (SUTURE) ×4 IMPLANT
SUT MNCRL AB 4-0 PS2 18 (SUTURE) ×4 IMPLANT
SUT PROLENE 6 0 BV (SUTURE) ×8 IMPLANT
SUT VIC AB 3-0 SH 27 (SUTURE) ×2
SUT VIC AB 3-0 SH 27X BRD (SUTURE) ×2 IMPLANT
SYR 10ML LL (SYRINGE) IMPLANT
SYR 20ML LL LF (SYRINGE) ×4 IMPLANT
SYR 3ML LL SCALE MARK (SYRINGE) IMPLANT
SYR 5ML LL (SYRINGE) IMPLANT
SYR CONTROL 10ML LL (SYRINGE) IMPLANT
TOWEL GREEN STERILE (TOWEL DISPOSABLE) ×4 IMPLANT
TOWEL GREEN STERILE FF (TOWEL DISPOSABLE) ×4 IMPLANT
UNDERPAD 30X36 HEAVY ABSORB (UNDERPADS AND DIAPERS) ×4 IMPLANT
WATER STERILE IRR 1000ML POUR (IV SOLUTION) ×4 IMPLANT

## 2020-08-15 NOTE — Op Note (Signed)
DATE OF SERVICE: 08/15/2020  PATIENT:  Seth Keller  55 y.o. male  PRE-OPERATIVE DIAGNOSIS:  CKD nearing ESRD  POST-OPERATIVE DIAGNOSIS:  Same  PROCEDURE:   Right radiocephalic arteriovenous fistula creation  SURGEON:  Surgeon(s) and Role:    * Cherre Robins, MD - Primary  ASSISTANT: Gerri Lins, PA-C  An assistant was required to facilitate exposure and expedite the case.  ANESTHESIA:   general  EBL: min  BLOOD ADMINISTERED:none  DRAINS: none   LOCAL MEDICATIONS USED:  NONE  SPECIMEN:  none  COUNTS: confirmed correct.  TOURNIQUET:  none  PATIENT DISPOSITION:  PACU - hemodynamically stable.   Delay start of Pharmacological VTE agent (>24hrs) due to surgical blood loss or risk of bleeding: no  INDICATION FOR PROCEDURE: Seth Keller is a 55 y.o. left handed male with deteriorating chronic kidney disease. After careful discussion of risks, benefits, and alternatives the patient was offered right radiocephalic arteriovenous fistula creation. We specifically discussed risk of steal syndrome. The patient understood and wished to proceed.  OPERATIVE FINDINGS: healthy cephalic vein and radial artery at the wrist. Good flow in the ulnar, distal radial arteries and fistula upon completion.  DESCRIPTION OF PROCEDURE: After identification of the patient in the pre-operative holding area, the patient was transferred to the operating room. The patient was positioned supine on the operating room table. Anesthesia was induced. The right arm was prepped and draped in standard fashion. A surgical pause was performed confirming correct patient, procedure, and operative location.  Using intraoperative ultrasound the course of the cephalic vein and radial artery were mapped.  Both appeared adequate for AV fistula creation.  A longitudinal incision was made over the right radial wrist between the two vessels.  The cephalic vein was exposed circumferentially.  The radial artery was exposed  circumferentially.  Silastic Vesseloops were used to encircled the radial artery proximally distally.  The patient was systemically heparinized.  The distal aspect of the cephalic vein was transected.  The distal stump was oversewn with a 2-0 silk.  The proximal cephalic vein was controlled with a bulldog clamp.  The radial artery was clamped proximally distally.  An anterior arteriotomy was made with an 11 blade and extended with Potts scissors.  The cephalic vein was anastomosed to the radial artery end-to-side using continuous running suture of 6-0 Prolene with parachute technique.  Immediately prior to completion the anastomosis was flushed and de-aired.  Clamps were released on the cephalic vein, proximal/distal radial artery.  Hemostasis was achieved.  Doppler machine was used to interrogate the fistula.  Excellent bruit was heard in the fistula.  Biphasic Doppler signal was heard in the radial artery distal to the fistula.  Triphasic signal was heard proximal to the fistula.  The wound was irrigated.  The wound was closed in layers using 3-0 Vicryl and 4-0 Monocryl.  Dermabond was applied.  Upon completion of the case instrument and sharps counts were confirmed correct. The patient was transferred to the PACU in good condition. I was present for all portions of the procedure.  Yevonne Aline. Stanford Breed, MD [facility] Phone Number: [phone] 08/15/2020 8:58 AM

## 2020-08-15 NOTE — Anesthesia Postprocedure Evaluation (Signed)
Anesthesia Post Note  Patient: Kevon Reinwald  Procedure(s) Performed: RIGHT ARM RADIOCEPHALIC ARTERIOVENOUS (AV) FISTULA CREATION (Right)     Patient location during evaluation: PACU Anesthesia Type: General Level of consciousness: awake and alert Pain management: pain level controlled Vital Signs Assessment: post-procedure vital signs reviewed and stable Respiratory status: spontaneous breathing, nonlabored ventilation and respiratory function stable Cardiovascular status: blood pressure returned to baseline and stable Postop Assessment: no apparent nausea or vomiting Anesthetic complications: no   No notable events documented.  Last Vitals:  Vitals:   08/15/20 0925 08/15/20 0940  BP: (!) 141/80 137/80  Pulse: 70 66  Resp: 19 20  Temp:  (!) 36.2 C  SpO2: 100% 99%    Last Pain:  Vitals:   08/15/20 0940  TempSrc:   PainSc: 0-No pain                 Lynda Rainwater

## 2020-08-15 NOTE — H&P (Signed)
[facility] [place]  ASSESSMENT / PLAN: Seth Keller is a 55 y.o. left handed male in need of permanent hemodialysis access. I reviewed options for dialysis in detail with the patient. I counseled the patient that dialysis access requires surveillance and periodic maintenance. Plan to proceed with right radiocephalic arteriovenous fistula creation today in OR.  CHIEF COMPLAINT: CKD IV in need of HD access  HISTORY OF PRESENT ILLNESS: Seth Keller is a 55 y.o. male referred to the clinic for evaluation of deteriorating renal function.  He is a left-handed gentleman.  He reports no history of pacemaker or central venous catheter placement.  No history of trauma or surgery to the upper extremities.  Past Medical History:  Diagnosis Date   Atrial fibrillation with RVR ([facility] [place])    a. 12/2013 post-op CABG ->on amio.   CAD (coronary artery disease)    a. 12/2013 NSTEMI/Cath: 3VD;  b. 12/2013 CABG x 4: LIMA->LAD, VG->RI, VG->OM1->LPDA.   Chronic combined systolic and diastolic CHF (congestive heart failure) ([place])    a. 12/2013 Echo: EF 45%, Gr 2 DD.   CKD (chronic kidney disease), stage III (H[facility])    Dyspnea    Hypertension    a. Dx ~ 2000.   Ischemic cardiomyopathy    a. 12/2013 Echo: EF 45%, Gr 2 DD, basal-mid inflat and inf AK, mild MR.   Myocardial infarction ([place])    Tobacco abuse     Past Surgical History:  Procedure Laterality Date   CARDIAC CATHETERIZATION     CORONARY ARTERY BYPASS GRAFT N/A 01/01/2014   Procedure: CORONARY ARTERY BYPASS GRAFTING (CABG) times four using left internal mammary artery and right saphenous vein;  Surgeon: Gaye Pollack, MD;  Location: [facility] OR;  Service: Open Heart Surgery;  Laterality: N/A;   INTRAOPERATIVE TRANSESOPHAGEAL ECHOCARDIOGRAM N/A 01/01/2014   Procedure: INTRAOPERATIVE TRANSESOPHAGEAL ECHOCARDIOGRAM;  Surgeon: Gaye Pollack, MD;  Location: [facility] OR;  Service: Open Heart Surgery;  Laterality: N/A;   LEFT HEART CATHETERIZATION WITH  CORONARY ANGIOGRAM N/A 12/29/2013   Procedure: LEFT HEART CATHETERIZATION WITH CORONARY ANGIOGRAM;  Surgeon: Sinclair Grooms, MD;  Location: [facility] CATH LAB;  Service: Cardiovascular;  Laterality: N/A;   none      Family History  Problem Relation Age of Onset   Heart disease Mother 77   Hypertension Father 33   Asthma Father    Diabetes Paternal Grandfather    Cancer Neg Hx     Social History   Socioeconomic History   Marital status: Single    Spouse name: Not on file   Number of children: 0   Years of education: 12   Highest education level: Not on file  Occupational History   Occupation: Unemployed    Comment: 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   From [place],  originally.   Moved to [place] in 2012.    Social Determinants of Health   Financial Resource Strain: Not on file  Food Insecurity: Not on file  Transportation Needs: Not on file  Physical Activity: Not on file  Stress: Not on file  Social Connections: Not on file  Intimate Partner Violence: Not on file    Allergies  Allergen Reactions   Shellfish Allergy Anaphylaxis    Throat close up, swelling    Current Facility-Administered Medications  Medication Dose Route Frequency Provider Last Rate Last Admin   0.9 %  sodium chloride infusion   Intravenous Continuous Lynda Rainwater, MD   New Bag at 08/15/20 0700   metoprolol tartrate (LOPRESSOR) 12.5 mg tablet              REVIEW OF SYSTEMS:  '[X]'$  denotes positive finding, '[ ]'$  denotes negative finding Cardiac  Comments:  Chest pain or chest pressure:    Shortness of breath upon exertion:    Short of breath when lying flat:    Irregular heart rhythm:        Vascular    Pain in calf, thigh, or hip brought on by ambulation:    Pain in feet at night that wakes you up from your sleep:     Blood clot in your veins:    Leg swelling:         Pulmonary    Oxygen at home:    Productive cough:     Wheezing:         Neurologic    Sudden weakness in arms or legs:     Sudden numbness in arms or legs:     Sudden onset of difficulty speaking or slurred speech:    Temporary loss of vision in one eye:     Problems with dizziness:         Gastrointestinal    Blood in stool:     Vomited blood:         Genitourinary    Burning when urinating:     Blood in urine:        Psychiatric    Major depression:         Hematologic    Bleeding problems:    Problems with blood clotting too easily:        Skin    Rashes or ulcers:        Constitutional    Fever or chills:      PHYSICAL EXAM  Vitals:   08/15/20 0549  BP: (!) 189/82  Pulse: 81  Resp: 18  Temp: 98.7 F (37.1 C)  TempSrc: Oral  SpO2: 99%  Weight: 116.6 kg  Height: '5\' 8"'$  (1.727 m)    Constitutional: well appearing. no distress. Appears well nourished.  Neurologic: CN intact. no focal findings. no sensory loss. Psychiatric: Mood and affect symmetric and appropriate. Eyes: No icterus. No conjunctival pallor. Ears, nose, throat: mucous membranes moist. Midline trachea.  Cardiac: regular rate and rhythm.  Respiratory: unlabored. Abdominal: soft, non-tender, non-distended.  Peripheral vascular:  2+ R radial and ulnar pulse  Normal Allen's test Extremity: No edema. No cyanosis. No pallor.  Skin: No gangrene. No ulceration.  Lymphatic: No Stemmer's sign. No palpable lymphadenopathy.  PERTINENT LABORATORY AND RADIOLOGIC DATA  Most  recent CBC CBC Latest Ref Rng & Units 03/18/2020 10/02/2018 08/16/2014  WBC 3.4 - 10.8 x10E3/uL 8.8 7.3 8.0  Hemoglobin 13.0 - 17.7 g/dL 12.6(L) 13.1 13.7  Hematocrit 37.5 - 51.0 %  38.5 39.8 40.9  Platelets 150 - 450 x10E3/uL 289 236 252     Most recent CMP CMP Latest Ref Rng & Units 03/18/2020 07/06/2019 10/02/2018  Glucose 65 - 99 mg/dL - 103(H) 131(H)  BUN 6 - 24 mg/dL - 26(H) 17  Creatinine 0.76 - 1.27 mg/dL - 2.74(H) 2.14(H)  Sodium 134 - 144 mmol/L - 142 142  Potassium 3.5 - 5.2 mmol/L 4.8 4.5 4.1  Chloride 96 - 106 mmol/L - 106 105  CO2 20 - 29 mmol/L - 21 23  Calcium 8.7 - 10.2 mg/dL - 9.2 8.8  Total Protein 6.0 - 8.5 g/dL 6.9 - 6.7  Total Bilirubin 0.0 - 1.2 mg/dL 0.2 - 0.3  Alkaline Phos 44 - 121 IU/L 108 - 92  AST 0 - 40 IU/L 14 - 14  ALT 0 - 44 IU/L 24 - 14    Renal function CrCl cannot be calculated (Patient's most recent lab result is older than the maximum 21 days allowed.).  Hgb A1c MFr Bld (%)  Date Value  12/26/2013 5.7 (H)    LDL Chol Calc (NIH)  Date Value Ref Range Status  03/18/2020 106 (H) 0 - 99 mg/dL Final   Direct LDL  Date Value Ref Range Status  05/29/2012 198 (H) mg/dL Final    Comment:    ATP III Classification (LDL):       < 100        mg/dL         Optimal      100 - 129     mg/dL         Near or Above Optimal      130 - 159     mg/dL         Borderline High      160 - 189     mg/dL         High       > 190        mg/dL         Very High       Vascular Imaging: UPPER EXTREMITY VEIN MAPPING   Indications: Pre-access.   History: ESRD.   Performing Technologist: Ronal Fear RVS, RCS      Examination Guidelines: A complete evaluation includes B-mode imaging,  spectral  Doppler, color Doppler, and power Doppler as needed of all accessible  portions  of each vessel. Bilateral testing is considered an integral part of a  complete  examination. Limited examinations for reoccurring indications may be  performed  as noted.    +-----------------+-------------+----------+--------+  Right Cephalic   Diameter (cm)Depth (cm)Findings  +-----------------+-------------+----------+--------+  Shoulder             0.46                         +-----------------+-------------+----------+--------+  Prox upper arm       0.51                         +-----------------+-------------+----------+--------+  Mid upper arm        0.51                         +-----------------+-------------+----------+--------+  Dist upper arm       0.56                         +-----------------+-------------+----------+--------+  Antecubital fossa    0.61                         +-----------------+-------------+----------+--------+  Prox forearm         0.47                         +-----------------+-------------+----------+--------+  Mid forearm          0.45                         +-----------------+-------------+----------+--------+  Dist forearm         0.50                         +-----------------+-------------+----------+--------+   +-----------------+-------------+----------+--------+  Right Basilic    Diameter (cm)Depth (cm)Findings  +-----------------+-------------+----------+--------+  Prox upper arm       0.64                         +-----------------+-------------+----------+--------+  Mid upper arm        0.57                         +-----------------+-------------+----------+--------+  Dist upper arm       0.69                         +-----------------+-------------+----------+--------+  Antecubital fossa    0.63                         +-----------------+-------------+----------+--------+   +-----------------+-------------+----------+--------+  Left Cephalic    Diameter (cm)Depth (cm)Findings  +-----------------+-------------+----------+--------+  Shoulder             0.39                          +-----------------+-------------+----------+--------+  Prox upper arm       0.34                         +-----------------+-------------+----------+--------+  Mid upper arm        0.38                         +-----------------+-------------+----------+--------+  Dist upper arm       0.39                         +-----------------+-------------+----------+--------+  Antecubital fossa    0.44                         +-----------------+-------------+----------+--------+  Prox forearm         0.28                         +-----------------+-------------+----------+--------+  Mid forearm          0.38                         +-----------------+-------------+----------+--------+  Dist forearm         0.40                         +-----------------+-------------+----------+--------+   +-----------------+-------------+----------+--------+  Left Basilic     Diameter (cm)Depth (cm)Findings  +-----------------+-------------+----------+--------+  Prox upper arm       0.66                         +-----------------+-------------+----------+--------+  Mid upper arm        0.65                         +-----------------+-------------+----------+--------+  Dist upper arm       0.73                         +-----------------+-------------+----------+--------+  Antecubital fossa    0.56                         +-----------------+-------------+----------+--------+   Summary: Right: Patent and compressible cephalic and basilic veins.  Left: Patent and compressible cephalic and basilic veins.   *See table(s) above for measurements and observations.       Diagnosing physician: Cyndia Diver. Stanford Breed, MD [facility] Phone Number: [phone] 08/15/2020 7:15 AM

## 2020-08-15 NOTE — Anesthesia Procedure Notes (Signed)
Procedure Name: LMA Insertion Date/Time: 08/15/2020 7:39 AM Performed by: Colin Benton, CRNA Pre-anesthesia Checklist: Patient identified, Emergency Drugs available, Suction available and Patient being monitored Patient Re-evaluated:Patient Re-evaluated prior to induction Oxygen Delivery Method: Circle System Utilized Preoxygenation: Pre-oxygenation with 100% oxygen Induction Type: IV induction Ventilation: Mask ventilation without difficulty LMA: LMA inserted LMA Size: 4.0 Number of attempts: 1 Placement Confirmation: positive ETCO2 Tube secured with: Tape Dental Injury: Teeth and Oropharynx as per pre-operative assessment

## 2020-08-15 NOTE — Discharge Instructions (Signed)
° °  [facility] ° °Discharge Instructions ° °AV Fistula or Graft Surgery for Dialysis Access ° °Please refer to the following instructions for your post-procedure care. Your surgeon or physician assistant will discuss any changes with you. ° °Activity ° °You may drive the day following your surgery, if you are comfortable and no longer taking prescription pain medication. Resume full activity as the soreness in your incision resolves. ° °Bathing/Showering ° °You may shower after you go home. Keep your incision dry for 48 hours. Do not soak in a bathtub, hot tub, or swim until the incision heals completely. You may not shower if you have a hemodialysis catheter. ° °Incision Care ° °Clean your incision with mild soap and water after 48 hours. Pat the area dry with a clean towel. You do not need a bandage unless otherwise instructed. Do not apply any ointments or creams to your incision. You may have skin glue on your incision. Do not peel it off. It will come off on its own in about one week. Your arm may swell a bit after surgery. To reduce swelling use pillows to elevate your arm so it is above your heart. Your doctor will tell you if you need to lightly wrap your arm with an ACE bandage. ° °Diet ° °Resume your normal diet. There are not special food restrictions following this procedure. In order to heal from your surgery, it is CRITICAL to get adequate nutrition. Your body requires vitamins, minerals, and protein. Vegetables are the best source of vitamins and minerals. Vegetables also provide the perfect balance of protein. Processed food has little nutritional value, so try to avoid this. ° °Medications ° °Resume taking all of your medications. If your incision is causing pain, you may take over-the counter pain relievers such as acetaminophen (Tylenol). If you were prescribed a stronger pain medication, please be aware these medications can cause nausea and constipation. Prevent  nausea by taking the medication with a snack or meal. Avoid constipation by drinking plenty of fluids and eating foods with high amount of fiber, such as fruits, vegetables, and grains. Do not take Tylenol if you are taking prescription pain medications. ° ° ° ° °Follow up °Your surgeon may want to see you in the office following your access surgery. If so, this will be arranged at the time of your surgery. ° °Please call us immediately for any of the following conditions: ° °Increased pain, redness, drainage (pus) from your incision site °Fever of 101 degrees or higher °Severe or worsening pain at your incision site °Hand pain or numbness. ° °Reduce your risk of vascular disease: ° °Stop smoking. If you would like help, call [facility] at 1-800-QUIT-NOW (1-800-784-8669) or [place] at 336-586-4000 ° °Manage your cholesterol °Maintain a desired weight °Control your diabetes °Keep your blood pressure down ° °Dialysis ° °It will take several weeks to several months for your new dialysis access to be ready for use. Your surgeon will determine when it is OK to use it. Your nephrologist will continue to direct your dialysis. You can continue to use your Permcath until your new access is ready for use. ° °If you have any questions, please call the office at [phone]. ° °

## 2020-08-15 NOTE — Transfer of Care (Signed)
Immediate Anesthesia Transfer of Care Note  Patient: Seth Keller  Procedure(s) Performed: RIGHT ARM RADIOCEPHALIC ARTERIOVENOUS (AV) FISTULA CREATION (Right)  Patient Location: PACU  Anesthesia Type:General  Level of Consciousness: drowsy and patient cooperative  Airway & Oxygen Therapy: Patient Spontanous Breathing and Patient connected to face mask oxygen  Post-op Assessment: Report given to RN, Post -op Vital signs reviewed and stable and Patient moving all extremities X 4  Post vital signs: Reviewed and stable  Last Vitals:  Vitals Value Taken Time  BP    Temp    Pulse    Resp    SpO2      Last Pain:  Vitals:   08/15/20 0601  TempSrc:   PainSc: 0-No pain         Complications: No notable events documented.

## 2020-08-16 ENCOUNTER — Encounter (HOSPITAL_COMMUNITY): Payer: Self-pay | Admitting: Vascular Surgery

## 2020-09-05 ENCOUNTER — Other Ambulatory Visit: Payer: Self-pay

## 2020-09-05 DIAGNOSIS — N184 Chronic kidney disease, stage 4 (severe): Secondary | ICD-10-CM

## 2020-09-08 ENCOUNTER — Telehealth: Payer: Self-pay

## 2020-09-08 NOTE — Telephone Encounter (Signed)
Pt called to r/s his HD access f/u. He was offered several dates and opted for the furthest out appt on 09/29/20.

## 2020-09-09 DIAGNOSIS — D631 Anemia in chronic kidney disease: Secondary | ICD-10-CM | POA: Diagnosis not present

## 2020-09-09 DIAGNOSIS — I255 Ischemic cardiomyopathy: Secondary | ICD-10-CM | POA: Diagnosis not present

## 2020-09-09 DIAGNOSIS — N189 Chronic kidney disease, unspecified: Secondary | ICD-10-CM | POA: Diagnosis not present

## 2020-09-09 DIAGNOSIS — I509 Heart failure, unspecified: Secondary | ICD-10-CM | POA: Diagnosis not present

## 2020-09-09 DIAGNOSIS — I120 Hypertensive chronic kidney disease with stage 5 chronic kidney disease or end stage renal disease: Secondary | ICD-10-CM | POA: Diagnosis not present

## 2020-09-09 DIAGNOSIS — R809 Proteinuria, unspecified: Secondary | ICD-10-CM | POA: Diagnosis not present

## 2020-09-09 DIAGNOSIS — N185 Chronic kidney disease, stage 5: Secondary | ICD-10-CM | POA: Diagnosis not present

## 2020-09-09 DIAGNOSIS — N2581 Secondary hyperparathyroidism of renal origin: Secondary | ICD-10-CM | POA: Diagnosis not present

## 2020-09-09 DIAGNOSIS — R7302 Impaired glucose tolerance (oral): Secondary | ICD-10-CM | POA: Diagnosis not present

## 2020-09-10 ENCOUNTER — Other Ambulatory Visit: Payer: Self-pay | Admitting: Internal Medicine

## 2020-09-10 DIAGNOSIS — I255 Ischemic cardiomyopathy: Secondary | ICD-10-CM

## 2020-09-11 NOTE — Telephone Encounter (Signed)
Requested Prescriptions  Pending Prescriptions Disp Refills  . potassium chloride SA (KLOR-CON) 20 MEQ tablet [Pharmacy Med Name: Potassium Chloride Crys ER 20 MEQ Oral Tablet Extended Release] 60 tablet 0    Sig: Take 2 tablets by mouth once daily     Endocrinology:  Minerals - Potassium Supplementation Failed - 09/10/2020  7:02 PM      Failed - Cr in normal range and within 360 days    Creat  Date Value Ref Range Status  06/16/2015 1.88 (H) 0.70 - 1.33 mg/dL Final   Creatinine, Ser  Date Value Ref Range Status  07/06/2019 2.74 (H) 0.76 - 1.27 mg/dL Final         Passed - K in normal range and within 360 days    Potassium  Date Value Ref Range Status  03/18/2020 4.8 3.5 - 5.2 mmol/L Final         Passed - Valid encounter within last 12 months    Recent Outpatient Visits          5 months ago Essential hypertension   [place], Deborah B, MD   1 year ago Encounter for [employer] annual wellness exam   [place], Jarome Matin, RPH-CPP   1 year ago Essential hypertension   [place], Deborah B, MD   1 year ago [place], Dionne Bucy, [place]   1 year ago Essential hypertension   [place] Ladell Pier, MD

## 2020-09-13 ENCOUNTER — Encounter (HOSPITAL_COMMUNITY): Payer: Medicare Other

## 2020-09-19 ENCOUNTER — Other Ambulatory Visit: Payer: Self-pay

## 2020-09-19 DIAGNOSIS — N186 End stage renal disease: Secondary | ICD-10-CM | POA: Diagnosis not present

## 2020-09-19 DIAGNOSIS — N184 Chronic kidney disease, stage 4 (severe): Secondary | ICD-10-CM

## 2020-09-19 DIAGNOSIS — Z992 Dependence on renal dialysis: Secondary | ICD-10-CM | POA: Diagnosis not present

## 2020-09-22 DIAGNOSIS — N2581 Secondary hyperparathyroidism of renal origin: Secondary | ICD-10-CM | POA: Diagnosis not present

## 2020-09-22 DIAGNOSIS — D509 Iron deficiency anemia, unspecified: Secondary | ICD-10-CM | POA: Diagnosis not present

## 2020-09-22 DIAGNOSIS — Z23 Encounter for immunization: Secondary | ICD-10-CM | POA: Diagnosis not present

## 2020-09-22 DIAGNOSIS — D631 Anemia in chronic kidney disease: Secondary | ICD-10-CM | POA: Diagnosis not present

## 2020-09-22 DIAGNOSIS — I953 Hypotension of hemodialysis: Secondary | ICD-10-CM | POA: Diagnosis not present

## 2020-09-22 DIAGNOSIS — Z992 Dependence on renal dialysis: Secondary | ICD-10-CM | POA: Diagnosis not present

## 2020-09-22 DIAGNOSIS — N186 End stage renal disease: Secondary | ICD-10-CM | POA: Diagnosis not present

## 2020-09-23 DIAGNOSIS — D509 Iron deficiency anemia, unspecified: Secondary | ICD-10-CM | POA: Diagnosis not present

## 2020-09-23 DIAGNOSIS — N2581 Secondary hyperparathyroidism of renal origin: Secondary | ICD-10-CM | POA: Diagnosis not present

## 2020-09-23 DIAGNOSIS — N186 End stage renal disease: Secondary | ICD-10-CM | POA: Diagnosis not present

## 2020-09-23 DIAGNOSIS — D631 Anemia in chronic kidney disease: Secondary | ICD-10-CM | POA: Diagnosis not present

## 2020-09-23 DIAGNOSIS — Z992 Dependence on renal dialysis: Secondary | ICD-10-CM | POA: Diagnosis not present

## 2020-09-23 DIAGNOSIS — I953 Hypotension of hemodialysis: Secondary | ICD-10-CM | POA: Diagnosis not present

## 2020-09-23 DIAGNOSIS — Z23 Encounter for immunization: Secondary | ICD-10-CM | POA: Diagnosis not present

## 2020-09-26 DIAGNOSIS — Z992 Dependence on renal dialysis: Secondary | ICD-10-CM | POA: Diagnosis not present

## 2020-09-26 DIAGNOSIS — N2581 Secondary hyperparathyroidism of renal origin: Secondary | ICD-10-CM | POA: Diagnosis not present

## 2020-09-26 DIAGNOSIS — D631 Anemia in chronic kidney disease: Secondary | ICD-10-CM | POA: Diagnosis not present

## 2020-09-26 DIAGNOSIS — I953 Hypotension of hemodialysis: Secondary | ICD-10-CM | POA: Diagnosis not present

## 2020-09-26 DIAGNOSIS — N186 End stage renal disease: Secondary | ICD-10-CM | POA: Diagnosis not present

## 2020-09-26 DIAGNOSIS — Z23 Encounter for immunization: Secondary | ICD-10-CM | POA: Diagnosis not present

## 2020-09-26 DIAGNOSIS — D509 Iron deficiency anemia, unspecified: Secondary | ICD-10-CM | POA: Diagnosis not present

## 2020-09-27 DIAGNOSIS — N186 End stage renal disease: Secondary | ICD-10-CM | POA: Diagnosis not present

## 2020-09-27 DIAGNOSIS — D631 Anemia in chronic kidney disease: Secondary | ICD-10-CM | POA: Diagnosis not present

## 2020-09-27 DIAGNOSIS — N2581 Secondary hyperparathyroidism of renal origin: Secondary | ICD-10-CM | POA: Diagnosis not present

## 2020-09-27 DIAGNOSIS — Z992 Dependence on renal dialysis: Secondary | ICD-10-CM | POA: Diagnosis not present

## 2020-09-27 DIAGNOSIS — Z23 Encounter for immunization: Secondary | ICD-10-CM | POA: Diagnosis not present

## 2020-09-27 DIAGNOSIS — I953 Hypotension of hemodialysis: Secondary | ICD-10-CM | POA: Diagnosis not present

## 2020-09-27 DIAGNOSIS — D509 Iron deficiency anemia, unspecified: Secondary | ICD-10-CM | POA: Diagnosis not present

## 2020-09-29 ENCOUNTER — Encounter (HOSPITAL_COMMUNITY): Payer: Medicare Other

## 2020-09-29 DIAGNOSIS — N186 End stage renal disease: Secondary | ICD-10-CM | POA: Diagnosis not present

## 2020-09-29 DIAGNOSIS — I953 Hypotension of hemodialysis: Secondary | ICD-10-CM | POA: Diagnosis not present

## 2020-09-29 DIAGNOSIS — Z992 Dependence on renal dialysis: Secondary | ICD-10-CM | POA: Diagnosis not present

## 2020-09-29 DIAGNOSIS — N2581 Secondary hyperparathyroidism of renal origin: Secondary | ICD-10-CM | POA: Diagnosis not present

## 2020-09-29 DIAGNOSIS — Z23 Encounter for immunization: Secondary | ICD-10-CM | POA: Diagnosis not present

## 2020-09-29 DIAGNOSIS — D509 Iron deficiency anemia, unspecified: Secondary | ICD-10-CM | POA: Diagnosis not present

## 2020-09-29 DIAGNOSIS — D631 Anemia in chronic kidney disease: Secondary | ICD-10-CM | POA: Diagnosis not present

## 2020-09-30 DIAGNOSIS — Z992 Dependence on renal dialysis: Secondary | ICD-10-CM | POA: Diagnosis not present

## 2020-09-30 DIAGNOSIS — N2581 Secondary hyperparathyroidism of renal origin: Secondary | ICD-10-CM | POA: Diagnosis not present

## 2020-09-30 DIAGNOSIS — I953 Hypotension of hemodialysis: Secondary | ICD-10-CM | POA: Diagnosis not present

## 2020-09-30 DIAGNOSIS — D631 Anemia in chronic kidney disease: Secondary | ICD-10-CM | POA: Diagnosis not present

## 2020-09-30 DIAGNOSIS — D509 Iron deficiency anemia, unspecified: Secondary | ICD-10-CM | POA: Diagnosis not present

## 2020-09-30 DIAGNOSIS — Z23 Encounter for immunization: Secondary | ICD-10-CM | POA: Diagnosis not present

## 2020-09-30 DIAGNOSIS — N186 End stage renal disease: Secondary | ICD-10-CM | POA: Diagnosis not present

## 2020-10-03 DIAGNOSIS — D509 Iron deficiency anemia, unspecified: Secondary | ICD-10-CM | POA: Diagnosis not present

## 2020-10-03 DIAGNOSIS — Z23 Encounter for immunization: Secondary | ICD-10-CM | POA: Diagnosis not present

## 2020-10-03 DIAGNOSIS — N2581 Secondary hyperparathyroidism of renal origin: Secondary | ICD-10-CM | POA: Diagnosis not present

## 2020-10-03 DIAGNOSIS — I953 Hypotension of hemodialysis: Secondary | ICD-10-CM | POA: Diagnosis not present

## 2020-10-03 DIAGNOSIS — Z992 Dependence on renal dialysis: Secondary | ICD-10-CM | POA: Diagnosis not present

## 2020-10-03 DIAGNOSIS — N186 End stage renal disease: Secondary | ICD-10-CM | POA: Diagnosis not present

## 2020-10-03 DIAGNOSIS — D631 Anemia in chronic kidney disease: Secondary | ICD-10-CM | POA: Diagnosis not present

## 2020-10-04 DIAGNOSIS — D631 Anemia in chronic kidney disease: Secondary | ICD-10-CM | POA: Diagnosis not present

## 2020-10-04 DIAGNOSIS — Z23 Encounter for immunization: Secondary | ICD-10-CM | POA: Diagnosis not present

## 2020-10-04 DIAGNOSIS — I953 Hypotension of hemodialysis: Secondary | ICD-10-CM | POA: Diagnosis not present

## 2020-10-04 DIAGNOSIS — D509 Iron deficiency anemia, unspecified: Secondary | ICD-10-CM | POA: Diagnosis not present

## 2020-10-04 DIAGNOSIS — Z992 Dependence on renal dialysis: Secondary | ICD-10-CM | POA: Diagnosis not present

## 2020-10-04 DIAGNOSIS — N2581 Secondary hyperparathyroidism of renal origin: Secondary | ICD-10-CM | POA: Diagnosis not present

## 2020-10-04 DIAGNOSIS — N186 End stage renal disease: Secondary | ICD-10-CM | POA: Diagnosis not present

## 2020-10-06 ENCOUNTER — Encounter: Payer: Self-pay | Admitting: Physician Assistant

## 2020-10-06 ENCOUNTER — Other Ambulatory Visit: Payer: Self-pay

## 2020-10-06 ENCOUNTER — Ambulatory Visit (INDEPENDENT_AMBULATORY_CARE_PROVIDER_SITE_OTHER): Payer: Medicare Other | Admitting: Physician Assistant

## 2020-10-06 ENCOUNTER — Ambulatory Visit (HOSPITAL_COMMUNITY)
Admission: RE | Admit: 2020-10-06 | Discharge: 2020-10-06 | Disposition: A | Discharge status: Home / Self Care | Payer: Medicare Other | Source: Ambulatory Visit | Attending: Vascular Surgery | Admitting: Vascular Surgery

## 2020-10-06 VITALS — BP 131/70 | HR 63 | Temp 98.0°F | Resp 20 | Ht 68.0 in | Wt 240.0 lb

## 2020-10-06 DIAGNOSIS — Z992 Dependence on renal dialysis: Secondary | ICD-10-CM | POA: Diagnosis not present

## 2020-10-06 DIAGNOSIS — I953 Hypotension of hemodialysis: Secondary | ICD-10-CM | POA: Diagnosis not present

## 2020-10-06 DIAGNOSIS — Z23 Encounter for immunization: Secondary | ICD-10-CM | POA: Diagnosis not present

## 2020-10-06 DIAGNOSIS — N184 Chronic kidney disease, stage 4 (severe): Secondary | ICD-10-CM

## 2020-10-06 DIAGNOSIS — D509 Iron deficiency anemia, unspecified: Secondary | ICD-10-CM | POA: Diagnosis not present

## 2020-10-06 DIAGNOSIS — D631 Anemia in chronic kidney disease: Secondary | ICD-10-CM | POA: Diagnosis not present

## 2020-10-06 DIAGNOSIS — N2581 Secondary hyperparathyroidism of renal origin: Secondary | ICD-10-CM | POA: Diagnosis not present

## 2020-10-06 DIAGNOSIS — N186 End stage renal disease: Secondary | ICD-10-CM | POA: Diagnosis not present

## 2020-10-06 NOTE — Progress Notes (Signed)
    Postoperative Access Visit   History of Present Illness   Seth Keller is a 55 y.o. year old male who presents for postoperative follow-up for: right radiocephalic AV fistula creation by Dr. Stanford Breed on 08/15/20. The patient's wounds are well healed.  The patient notes no steal symptoms.  The patient is able to complete their activities of daily living.  He is currently dialyzing via right IJ TDC.  Physical Examination   Vitals:   10/06/20 1502  BP: 131/70  Pulse: 63  Resp: 20  Temp: 98 F (36.7 C)  SpO2: 99%  Weight: 240 lb (108.9 kg)  Height: '5\' 8"'$  (1.727 m)   Body mass index is 36.49 kg/m.  right arm Incision is well healed, 2+ radial pulse, hand grip is 5/5, sensation in digits is  intact, palpable thrill, bruit can  be auscultated    Non invasive vascular lab: Findings:  +--------------------+----------+-----------------+--------+  AVF                 PSV (cm/s)Flow Vol (mL/min)Comments  +--------------------+----------+-----------------+--------+  Native artery inflow   357           855                 +--------------------+----------+-----------------+--------+  AVF Anastomosis        628                               +--------------------+----------+-----------------+--------+   +------------+----------+-------------+----------+--------+  OUTFLOW VEINPSV (cm/s)Diameter (cm)Depth (cm)Describe  +------------+----------+-------------+----------+--------+  AC Fossa       143        0.52        0.37             +------------+----------+-------------+----------+--------+  Prox Forearm   126        0.34        0.28             +------------+----------+-------------+----------+--------+  Mid Forearm    118        0.49        0.33             +------------+----------+-------------+----------+--------+  Dist Forearm   470        0.30        0.41             +------------+----------+-------------+----------+--------+    Summary:   Patent arteriovenous fistula   Medical Decision Making   Seth Keller is a 55 y.o. year old male who presents s/p right radiocephalic AV fistula creation by Dr. Stanford Breed on 08/15/20. Fistula duplex shows patent fistula, good volume flow, good depth and diameter. Clinically it does feel deep in the right forearm. I have encouraged him to continue to exercise the right arm to help mature the fistula  Patent is without signs or symptoms of steal syndrome The patient's access will be ready for use after 11/15/20 The patient's tunneled dialysis catheter can be removed when Nephrology is comfortable with the performance of the right radiocephalic AV fistula The patient may follow up on a prn basis   Karoline Caldwell, PA-C [facility]: [phone]  Clinic MD: Scot Dock

## 2020-10-07 DIAGNOSIS — D631 Anemia in chronic kidney disease: Secondary | ICD-10-CM | POA: Diagnosis not present

## 2020-10-07 DIAGNOSIS — D509 Iron deficiency anemia, unspecified: Secondary | ICD-10-CM | POA: Diagnosis not present

## 2020-10-07 DIAGNOSIS — N186 End stage renal disease: Secondary | ICD-10-CM | POA: Diagnosis not present

## 2020-10-07 DIAGNOSIS — Z23 Encounter for immunization: Secondary | ICD-10-CM | POA: Diagnosis not present

## 2020-10-07 DIAGNOSIS — Z992 Dependence on renal dialysis: Secondary | ICD-10-CM | POA: Diagnosis not present

## 2020-10-07 DIAGNOSIS — I953 Hypotension of hemodialysis: Secondary | ICD-10-CM | POA: Diagnosis not present

## 2020-10-07 DIAGNOSIS — N2581 Secondary hyperparathyroidism of renal origin: Secondary | ICD-10-CM | POA: Diagnosis not present

## 2020-10-10 DIAGNOSIS — I953 Hypotension of hemodialysis: Secondary | ICD-10-CM | POA: Diagnosis not present

## 2020-10-10 DIAGNOSIS — D509 Iron deficiency anemia, unspecified: Secondary | ICD-10-CM | POA: Diagnosis not present

## 2020-10-10 DIAGNOSIS — Z992 Dependence on renal dialysis: Secondary | ICD-10-CM | POA: Diagnosis not present

## 2020-10-10 DIAGNOSIS — D631 Anemia in chronic kidney disease: Secondary | ICD-10-CM | POA: Diagnosis not present

## 2020-10-10 DIAGNOSIS — N186 End stage renal disease: Secondary | ICD-10-CM | POA: Diagnosis not present

## 2020-10-10 DIAGNOSIS — N2581 Secondary hyperparathyroidism of renal origin: Secondary | ICD-10-CM | POA: Diagnosis not present

## 2020-10-10 DIAGNOSIS — Z23 Encounter for immunization: Secondary | ICD-10-CM | POA: Diagnosis not present

## 2020-10-12 DIAGNOSIS — I493 Ventricular premature depolarization: Secondary | ICD-10-CM | POA: Diagnosis not present

## 2020-10-12 DIAGNOSIS — I498 Other specified cardiac arrhythmias: Secondary | ICD-10-CM | POA: Diagnosis not present

## 2020-10-13 DIAGNOSIS — Z23 Encounter for immunization: Secondary | ICD-10-CM | POA: Diagnosis not present

## 2020-10-13 DIAGNOSIS — I953 Hypotension of hemodialysis: Secondary | ICD-10-CM | POA: Diagnosis not present

## 2020-10-13 DIAGNOSIS — Z992 Dependence on renal dialysis: Secondary | ICD-10-CM | POA: Diagnosis not present

## 2020-10-13 DIAGNOSIS — N186 End stage renal disease: Secondary | ICD-10-CM | POA: Diagnosis not present

## 2020-10-13 DIAGNOSIS — N2581 Secondary hyperparathyroidism of renal origin: Secondary | ICD-10-CM | POA: Diagnosis not present

## 2020-10-13 DIAGNOSIS — D509 Iron deficiency anemia, unspecified: Secondary | ICD-10-CM | POA: Diagnosis not present

## 2020-10-13 DIAGNOSIS — D631 Anemia in chronic kidney disease: Secondary | ICD-10-CM | POA: Diagnosis not present

## 2020-10-14 DIAGNOSIS — D509 Iron deficiency anemia, unspecified: Secondary | ICD-10-CM | POA: Diagnosis not present

## 2020-10-14 DIAGNOSIS — N2581 Secondary hyperparathyroidism of renal origin: Secondary | ICD-10-CM | POA: Diagnosis not present

## 2020-10-14 DIAGNOSIS — D631 Anemia in chronic kidney disease: Secondary | ICD-10-CM | POA: Diagnosis not present

## 2020-10-14 DIAGNOSIS — N186 End stage renal disease: Secondary | ICD-10-CM | POA: Diagnosis not present

## 2020-10-14 DIAGNOSIS — Z23 Encounter for immunization: Secondary | ICD-10-CM | POA: Diagnosis not present

## 2020-10-14 DIAGNOSIS — I953 Hypotension of hemodialysis: Secondary | ICD-10-CM | POA: Diagnosis not present

## 2020-10-14 DIAGNOSIS — Z992 Dependence on renal dialysis: Secondary | ICD-10-CM | POA: Diagnosis not present

## 2020-10-16 ENCOUNTER — Telehealth: Payer: Self-pay

## 2020-10-16 NOTE — Telephone Encounter (Signed)
Lvm for pt to call office to schedule AWV on 10/22/20 @ 1 pm, 10/29/20 8-12, or 10/30/20 1-4  60 Min appt on nurse schedule AWV-Jawanna Dykman W

## 2020-10-18 DIAGNOSIS — N2581 Secondary hyperparathyroidism of renal origin: Secondary | ICD-10-CM | POA: Diagnosis not present

## 2020-10-18 DIAGNOSIS — D509 Iron deficiency anemia, unspecified: Secondary | ICD-10-CM | POA: Diagnosis not present

## 2020-10-18 DIAGNOSIS — D631 Anemia in chronic kidney disease: Secondary | ICD-10-CM | POA: Diagnosis not present

## 2020-10-18 DIAGNOSIS — Z992 Dependence on renal dialysis: Secondary | ICD-10-CM | POA: Diagnosis not present

## 2020-10-18 DIAGNOSIS — N186 End stage renal disease: Secondary | ICD-10-CM | POA: Diagnosis not present

## 2020-10-19 DIAGNOSIS — N186 End stage renal disease: Secondary | ICD-10-CM | POA: Diagnosis not present

## 2020-10-19 DIAGNOSIS — I129 Hypertensive chronic kidney disease with stage 1 through stage 4 chronic kidney disease, or unspecified chronic kidney disease: Secondary | ICD-10-CM | POA: Diagnosis not present

## 2020-10-19 DIAGNOSIS — Z992 Dependence on renal dialysis: Secondary | ICD-10-CM | POA: Diagnosis not present

## 2020-10-20 DIAGNOSIS — N2581 Secondary hyperparathyroidism of renal origin: Secondary | ICD-10-CM | POA: Diagnosis not present

## 2020-10-20 DIAGNOSIS — T8249XA Other complication of vascular dialysis catheter, initial encounter: Secondary | ICD-10-CM | POA: Diagnosis not present

## 2020-10-20 DIAGNOSIS — D509 Iron deficiency anemia, unspecified: Secondary | ICD-10-CM | POA: Diagnosis not present

## 2020-10-20 DIAGNOSIS — D631 Anemia in chronic kidney disease: Secondary | ICD-10-CM | POA: Diagnosis not present

## 2020-10-20 DIAGNOSIS — Z992 Dependence on renal dialysis: Secondary | ICD-10-CM | POA: Diagnosis not present

## 2020-10-20 DIAGNOSIS — N186 End stage renal disease: Secondary | ICD-10-CM | POA: Diagnosis not present

## 2020-10-22 DIAGNOSIS — T8249XA Other complication of vascular dialysis catheter, initial encounter: Secondary | ICD-10-CM | POA: Diagnosis not present

## 2020-10-22 DIAGNOSIS — N186 End stage renal disease: Secondary | ICD-10-CM | POA: Diagnosis not present

## 2020-10-22 DIAGNOSIS — D631 Anemia in chronic kidney disease: Secondary | ICD-10-CM | POA: Diagnosis not present

## 2020-10-22 DIAGNOSIS — N2581 Secondary hyperparathyroidism of renal origin: Secondary | ICD-10-CM | POA: Diagnosis not present

## 2020-10-22 DIAGNOSIS — D509 Iron deficiency anemia, unspecified: Secondary | ICD-10-CM | POA: Diagnosis not present

## 2020-10-22 DIAGNOSIS — Z992 Dependence on renal dialysis: Secondary | ICD-10-CM | POA: Diagnosis not present

## 2020-10-23 ENCOUNTER — Other Ambulatory Visit: Payer: Self-pay | Admitting: Internal Medicine

## 2020-10-23 DIAGNOSIS — I10 Essential (primary) hypertension: Secondary | ICD-10-CM

## 2020-10-25 ENCOUNTER — Other Ambulatory Visit: Payer: Self-pay | Admitting: Internal Medicine

## 2020-10-25 DIAGNOSIS — I10 Essential (primary) hypertension: Secondary | ICD-10-CM

## 2020-10-25 DIAGNOSIS — N186 End stage renal disease: Secondary | ICD-10-CM | POA: Diagnosis not present

## 2020-10-25 DIAGNOSIS — N2581 Secondary hyperparathyroidism of renal origin: Secondary | ICD-10-CM | POA: Diagnosis not present

## 2020-10-25 DIAGNOSIS — T8249XA Other complication of vascular dialysis catheter, initial encounter: Secondary | ICD-10-CM | POA: Diagnosis not present

## 2020-10-25 DIAGNOSIS — Z992 Dependence on renal dialysis: Secondary | ICD-10-CM | POA: Diagnosis not present

## 2020-10-25 DIAGNOSIS — D631 Anemia in chronic kidney disease: Secondary | ICD-10-CM | POA: Diagnosis not present

## 2020-10-25 DIAGNOSIS — D509 Iron deficiency anemia, unspecified: Secondary | ICD-10-CM | POA: Diagnosis not present

## 2020-10-25 NOTE — Telephone Encounter (Signed)
Patient called in states pharmacy refused med amlodipine, not saying why, and I dont see a reason either, and it shows the refill put in again. Please call back to advise if he needs to make an appt or is he still getting the medication.

## 2020-10-26 NOTE — Telephone Encounter (Signed)
Pt called about refill status/ I advised pt that an appt was needed/ pt is scheduled to see [facility] tomorrow and a follow up for 10.10.22 with Dr. Wynetta Emery

## 2020-10-26 NOTE — Telephone Encounter (Signed)
Pt. Has appointment in October.

## 2020-10-27 ENCOUNTER — Encounter: Payer: Self-pay | Admitting: Nurse Practitioner

## 2020-10-27 ENCOUNTER — Telehealth (INDEPENDENT_AMBULATORY_CARE_PROVIDER_SITE_OTHER): Payer: Medicare Other | Admitting: Nurse Practitioner

## 2020-10-27 ENCOUNTER — Other Ambulatory Visit: Payer: Self-pay

## 2020-10-27 DIAGNOSIS — I10 Essential (primary) hypertension: Secondary | ICD-10-CM | POA: Diagnosis not present

## 2020-10-27 DIAGNOSIS — D631 Anemia in chronic kidney disease: Secondary | ICD-10-CM | POA: Diagnosis not present

## 2020-10-27 DIAGNOSIS — Z992 Dependence on renal dialysis: Secondary | ICD-10-CM | POA: Diagnosis not present

## 2020-10-27 DIAGNOSIS — I2510 Atherosclerotic heart disease of native coronary artery without angina pectoris: Secondary | ICD-10-CM | POA: Diagnosis not present

## 2020-10-27 DIAGNOSIS — N186 End stage renal disease: Secondary | ICD-10-CM | POA: Diagnosis not present

## 2020-10-27 DIAGNOSIS — N2581 Secondary hyperparathyroidism of renal origin: Secondary | ICD-10-CM | POA: Diagnosis not present

## 2020-10-27 DIAGNOSIS — T8249XA Other complication of vascular dialysis catheter, initial encounter: Secondary | ICD-10-CM | POA: Diagnosis not present

## 2020-10-27 DIAGNOSIS — D509 Iron deficiency anemia, unspecified: Secondary | ICD-10-CM | POA: Diagnosis not present

## 2020-10-27 DIAGNOSIS — I5042 Chronic combined systolic (congestive) and diastolic (congestive) heart failure: Secondary | ICD-10-CM | POA: Diagnosis not present

## 2020-10-27 MED ORDER — FUROSEMIDE 40 MG PO TABS: 40.0000 mg | ORAL_TABLET | Freq: Every day | ORAL | 1 refills | Status: DC

## 2020-10-27 MED ORDER — AMLODIPINE BESYLATE 10 MG PO TABS: ORAL_TABLET | ORAL | 0 refills | Status: DC

## 2020-10-27 MED ORDER — ASPIRIN EC 81 MG PO TBEC: 81.0000 mg | DELAYED_RELEASE_TABLET | Freq: Every day | ORAL | 3 refills | Status: AC

## 2020-10-27 MED ORDER — ATORVASTATIN CALCIUM 80 MG PO TABS: 80.0000 mg | ORAL_TABLET | Freq: Every day | ORAL | 1 refills | Status: DC

## 2020-10-27 MED ORDER — ISOSORBIDE MONONITRATE ER 60 MG PO TB24: 60.0000 mg | ORAL_TABLET | Freq: Every day | ORAL | 3 refills | Status: DC

## 2020-10-27 NOTE — Progress Notes (Signed)
Virtual Visit via Video Note  I connected with Seth Keller on 10/27/20 at  8:30 AM EDT by a video enabled telemedicine application and verified that I am speaking with the correct person using two identifiers.  Location: Patient: home Provider: office   I discussed the limitations of evaluation and management by telemedicine and the availability of in person appointments. The patient expressed understanding and agreed to proceed.  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 Justin Mend), obesity.   Patient presents today for a video visit for medication refill.  This is a patient of Dr. Wynetta Emery.  Patient does have a history of hypertension and states that blood pressures have been within normal range.  Patient is trying to follow a low-sodium diet.  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  He is currently undergoing dialysis on Tuesday Thursdays and Saturdays.  Patient has been evaluated at [facility] for a possible kidney transplant is on the wait list currently.  Patient did have a full lab work-up this past June.  This included lipid panel. Denies f/c/s, n/v/d, hemoptysis, PND, edema.    Observations/Objective:  Vitals with BMI 10/06/2020 08/15/2020 08/15/2020  Height '5\' 8"'$  - -  Weight 240 lbs - -  BMI [id number] - -  Systolic [id number] [id number]  Diastolic 70 80 80  Pulse 63 66 70      Assessment and Plan:  Essential hypertension Close to goal of 130/80 or lower.    Continue current medications.    Strongly encouraged low-salt diet.  Continue: - amLODipine (NORVASC) 10 MG tablet; Take one tablet by mouth daily.   - aspirin EC 81 MG tablet; Take 1 tablet (81 mg total) by mouth daily.   - metoprolol tartrate (LOPRESSOR) 25 MG tablet; Take 1 tablet (25 mg total) by mouth 2 (two) times daily.     2. Class 2 severe  obesity due to excess calories with serious comorbidity and body mass index (BMI) of 37.0 to 37.9 in adult [facility])  Continue Healthy eating habits.   Encourage exercise as tolerated.    3. Coronary artery disease involving native coronary artery of native heart without angina pectoris Stable  Continue to follow with cardiologist.    Continue aspirin, atorvastatin and beta-blocker.   4. Chronic combined systolic and diastolic CHF (congestive heart failure) (HCC) Stable and compensated.    Continue current medications  - furosemide (LASIX) 40 MG tablet; Take 1 tablet (40 mg total) by mouth daily.    5. CKD (chronic kidney disease) stage 4, GFR 15-29 ml/min (HCC)  Followed by nephrology   6. Secondary hyperparathyroidism, renal ([place])  Followed by nephrology   Follow up:  Follow up with Dr. Wynetta Emery in 6 months or sooner if needed      I discussed the assessment and treatment plan with the patient. The patient was provided an opportunity to ask questions and all were answered. The patient agreed with the plan and demonstrated an understanding of the instructions.   The patient was advised to call back or seek an in-person evaluation if the symptoms worsen or if the condition fails to improve as anticipated.  I provided 23 minutes of non-face-to-face time during this encounter.   Fenton Foy, NP

## 2020-10-27 NOTE — Patient Instructions (Addendum)
Essential hypertension Close to goal of 130/80 or lower.    Continue current medications.    Strongly encouraged low-salt diet.  Continue: - amLODipine (NORVASC) 10 MG tablet; Take one tablet by mouth daily.   - aspirin EC 81 MG tablet; Take 1 tablet (81 mg total) by mouth daily.   - metoprolol tartrate (LOPRESSOR) 25 MG tablet; Take 1 tablet (25 mg total) by mouth 2 (two) times daily.     2. Class 2 severe obesity due to excess calories with serious comorbidity and body mass index (BMI) of 37.0 to 37.9 in adult [facility])  Continue Healthy eating habits.   Encourage exercise as tolerated.    3. Coronary artery disease involving native coronary artery of native heart without angina pectoris Stable  Continue to follow with cardiologist.    Continue aspirin, atorvastatin and beta-blocker.   4. Chronic combined systolic and diastolic CHF (congestive heart failure) (HCC) Stable and compensated.    Continue current medications  - furosemide (LASIX) 40 MG tablet; Take 1 tablet (40 mg total) by mouth daily.    5. CKD (chronic kidney disease) stage 4, GFR 15-29 ml/min (HCC)  Followed by nephrology   6. Secondary hyperparathyroidism, renal ([place])  Followed by nephrology   Follow up:  Follow up with Dr. Wynetta Emery in 6 months or sooner if needed

## 2020-10-29 DIAGNOSIS — D631 Anemia in chronic kidney disease: Secondary | ICD-10-CM | POA: Diagnosis not present

## 2020-10-29 DIAGNOSIS — Z992 Dependence on renal dialysis: Secondary | ICD-10-CM | POA: Diagnosis not present

## 2020-10-29 DIAGNOSIS — T8249XA Other complication of vascular dialysis catheter, initial encounter: Secondary | ICD-10-CM | POA: Diagnosis not present

## 2020-10-29 DIAGNOSIS — N186 End stage renal disease: Secondary | ICD-10-CM | POA: Diagnosis not present

## 2020-10-29 DIAGNOSIS — N2581 Secondary hyperparathyroidism of renal origin: Secondary | ICD-10-CM | POA: Diagnosis not present

## 2020-10-29 DIAGNOSIS — D509 Iron deficiency anemia, unspecified: Secondary | ICD-10-CM | POA: Diagnosis not present

## 2020-11-01 DIAGNOSIS — T8249XA Other complication of vascular dialysis catheter, initial encounter: Secondary | ICD-10-CM | POA: Diagnosis not present

## 2020-11-01 DIAGNOSIS — N186 End stage renal disease: Secondary | ICD-10-CM | POA: Diagnosis not present

## 2020-11-01 DIAGNOSIS — D631 Anemia in chronic kidney disease: Secondary | ICD-10-CM | POA: Diagnosis not present

## 2020-11-01 DIAGNOSIS — D509 Iron deficiency anemia, unspecified: Secondary | ICD-10-CM | POA: Diagnosis not present

## 2020-11-01 DIAGNOSIS — N2581 Secondary hyperparathyroidism of renal origin: Secondary | ICD-10-CM | POA: Diagnosis not present

## 2020-11-01 DIAGNOSIS — Z992 Dependence on renal dialysis: Secondary | ICD-10-CM | POA: Diagnosis not present

## 2020-11-03 DIAGNOSIS — Z992 Dependence on renal dialysis: Secondary | ICD-10-CM | POA: Diagnosis not present

## 2020-11-03 DIAGNOSIS — D631 Anemia in chronic kidney disease: Secondary | ICD-10-CM | POA: Diagnosis not present

## 2020-11-03 DIAGNOSIS — D509 Iron deficiency anemia, unspecified: Secondary | ICD-10-CM | POA: Diagnosis not present

## 2020-11-03 DIAGNOSIS — T8249XA Other complication of vascular dialysis catheter, initial encounter: Secondary | ICD-10-CM | POA: Diagnosis not present

## 2020-11-03 DIAGNOSIS — N186 End stage renal disease: Secondary | ICD-10-CM | POA: Diagnosis not present

## 2020-11-03 DIAGNOSIS — N2581 Secondary hyperparathyroidism of renal origin: Secondary | ICD-10-CM | POA: Diagnosis not present

## 2020-11-05 DIAGNOSIS — T8249XA Other complication of vascular dialysis catheter, initial encounter: Secondary | ICD-10-CM | POA: Diagnosis not present

## 2020-11-05 DIAGNOSIS — Z992 Dependence on renal dialysis: Secondary | ICD-10-CM | POA: Diagnosis not present

## 2020-11-05 DIAGNOSIS — N2581 Secondary hyperparathyroidism of renal origin: Secondary | ICD-10-CM | POA: Diagnosis not present

## 2020-11-05 DIAGNOSIS — N186 End stage renal disease: Secondary | ICD-10-CM | POA: Diagnosis not present

## 2020-11-05 DIAGNOSIS — D631 Anemia in chronic kidney disease: Secondary | ICD-10-CM | POA: Diagnosis not present

## 2020-11-05 DIAGNOSIS — D509 Iron deficiency anemia, unspecified: Secondary | ICD-10-CM | POA: Diagnosis not present

## 2020-11-08 DIAGNOSIS — D509 Iron deficiency anemia, unspecified: Secondary | ICD-10-CM | POA: Diagnosis not present

## 2020-11-08 DIAGNOSIS — D631 Anemia in chronic kidney disease: Secondary | ICD-10-CM | POA: Diagnosis not present

## 2020-11-08 DIAGNOSIS — N2581 Secondary hyperparathyroidism of renal origin: Secondary | ICD-10-CM | POA: Diagnosis not present

## 2020-11-08 DIAGNOSIS — N186 End stage renal disease: Secondary | ICD-10-CM | POA: Diagnosis not present

## 2020-11-08 DIAGNOSIS — T8249XA Other complication of vascular dialysis catheter, initial encounter: Secondary | ICD-10-CM | POA: Diagnosis not present

## 2020-11-08 DIAGNOSIS — Z992 Dependence on renal dialysis: Secondary | ICD-10-CM | POA: Diagnosis not present

## 2020-11-10 DIAGNOSIS — N186 End stage renal disease: Secondary | ICD-10-CM | POA: Diagnosis not present

## 2020-11-10 DIAGNOSIS — D631 Anemia in chronic kidney disease: Secondary | ICD-10-CM | POA: Diagnosis not present

## 2020-11-10 DIAGNOSIS — D509 Iron deficiency anemia, unspecified: Secondary | ICD-10-CM | POA: Diagnosis not present

## 2020-11-10 DIAGNOSIS — Z992 Dependence on renal dialysis: Secondary | ICD-10-CM | POA: Diagnosis not present

## 2020-11-10 DIAGNOSIS — N2581 Secondary hyperparathyroidism of renal origin: Secondary | ICD-10-CM | POA: Diagnosis not present

## 2020-11-10 DIAGNOSIS — T8249XA Other complication of vascular dialysis catheter, initial encounter: Secondary | ICD-10-CM | POA: Diagnosis not present

## 2020-11-12 DIAGNOSIS — N186 End stage renal disease: Secondary | ICD-10-CM | POA: Diagnosis not present

## 2020-11-12 DIAGNOSIS — N2581 Secondary hyperparathyroidism of renal origin: Secondary | ICD-10-CM | POA: Diagnosis not present

## 2020-11-12 DIAGNOSIS — D509 Iron deficiency anemia, unspecified: Secondary | ICD-10-CM | POA: Diagnosis not present

## 2020-11-12 DIAGNOSIS — D631 Anemia in chronic kidney disease: Secondary | ICD-10-CM | POA: Diagnosis not present

## 2020-11-12 DIAGNOSIS — Z992 Dependence on renal dialysis: Secondary | ICD-10-CM | POA: Diagnosis not present

## 2020-11-12 DIAGNOSIS — T8249XA Other complication of vascular dialysis catheter, initial encounter: Secondary | ICD-10-CM | POA: Diagnosis not present

## 2020-11-15 DIAGNOSIS — N2581 Secondary hyperparathyroidism of renal origin: Secondary | ICD-10-CM | POA: Diagnosis not present

## 2020-11-15 DIAGNOSIS — D631 Anemia in chronic kidney disease: Secondary | ICD-10-CM | POA: Diagnosis not present

## 2020-11-15 DIAGNOSIS — T8249XA Other complication of vascular dialysis catheter, initial encounter: Secondary | ICD-10-CM | POA: Diagnosis not present

## 2020-11-15 DIAGNOSIS — D509 Iron deficiency anemia, unspecified: Secondary | ICD-10-CM | POA: Diagnosis not present

## 2020-11-15 DIAGNOSIS — N186 End stage renal disease: Secondary | ICD-10-CM | POA: Diagnosis not present

## 2020-11-15 DIAGNOSIS — Z992 Dependence on renal dialysis: Secondary | ICD-10-CM | POA: Diagnosis not present

## 2020-11-17 DIAGNOSIS — D631 Anemia in chronic kidney disease: Secondary | ICD-10-CM | POA: Diagnosis not present

## 2020-11-17 DIAGNOSIS — T8249XA Other complication of vascular dialysis catheter, initial encounter: Secondary | ICD-10-CM | POA: Diagnosis not present

## 2020-11-17 DIAGNOSIS — D509 Iron deficiency anemia, unspecified: Secondary | ICD-10-CM | POA: Diagnosis not present

## 2020-11-17 DIAGNOSIS — Z992 Dependence on renal dialysis: Secondary | ICD-10-CM | POA: Diagnosis not present

## 2020-11-17 DIAGNOSIS — N2581 Secondary hyperparathyroidism of renal origin: Secondary | ICD-10-CM | POA: Diagnosis not present

## 2020-11-17 DIAGNOSIS — N186 End stage renal disease: Secondary | ICD-10-CM | POA: Diagnosis not present

## 2020-11-18 DIAGNOSIS — N186 End stage renal disease: Secondary | ICD-10-CM | POA: Diagnosis not present

## 2020-11-18 DIAGNOSIS — Z992 Dependence on renal dialysis: Secondary | ICD-10-CM | POA: Diagnosis not present

## 2020-11-18 DIAGNOSIS — I129 Hypertensive chronic kidney disease with stage 1 through stage 4 chronic kidney disease, or unspecified chronic kidney disease: Secondary | ICD-10-CM | POA: Diagnosis not present

## 2020-11-19 DIAGNOSIS — Z992 Dependence on renal dialysis: Secondary | ICD-10-CM | POA: Diagnosis not present

## 2020-11-19 DIAGNOSIS — D509 Iron deficiency anemia, unspecified: Secondary | ICD-10-CM | POA: Diagnosis not present

## 2020-11-19 DIAGNOSIS — N2581 Secondary hyperparathyroidism of renal origin: Secondary | ICD-10-CM | POA: Diagnosis not present

## 2020-11-19 DIAGNOSIS — D631 Anemia in chronic kidney disease: Secondary | ICD-10-CM | POA: Diagnosis not present

## 2020-11-19 DIAGNOSIS — N186 End stage renal disease: Secondary | ICD-10-CM | POA: Diagnosis not present

## 2020-11-19 DIAGNOSIS — T8249XA Other complication of vascular dialysis catheter, initial encounter: Secondary | ICD-10-CM | POA: Diagnosis not present

## 2020-11-22 DIAGNOSIS — N186 End stage renal disease: Secondary | ICD-10-CM | POA: Diagnosis not present

## 2020-11-22 DIAGNOSIS — D509 Iron deficiency anemia, unspecified: Secondary | ICD-10-CM | POA: Diagnosis not present

## 2020-11-22 DIAGNOSIS — D631 Anemia in chronic kidney disease: Secondary | ICD-10-CM | POA: Diagnosis not present

## 2020-11-22 DIAGNOSIS — Z992 Dependence on renal dialysis: Secondary | ICD-10-CM | POA: Diagnosis not present

## 2020-11-22 DIAGNOSIS — N2581 Secondary hyperparathyroidism of renal origin: Secondary | ICD-10-CM | POA: Diagnosis not present

## 2020-11-22 DIAGNOSIS — T8249XA Other complication of vascular dialysis catheter, initial encounter: Secondary | ICD-10-CM | POA: Diagnosis not present

## 2020-11-24 DIAGNOSIS — D631 Anemia in chronic kidney disease: Secondary | ICD-10-CM | POA: Diagnosis not present

## 2020-11-24 DIAGNOSIS — D509 Iron deficiency anemia, unspecified: Secondary | ICD-10-CM | POA: Diagnosis not present

## 2020-11-24 DIAGNOSIS — T8249XA Other complication of vascular dialysis catheter, initial encounter: Secondary | ICD-10-CM | POA: Diagnosis not present

## 2020-11-24 DIAGNOSIS — N186 End stage renal disease: Secondary | ICD-10-CM | POA: Diagnosis not present

## 2020-11-24 DIAGNOSIS — Z992 Dependence on renal dialysis: Secondary | ICD-10-CM | POA: Diagnosis not present

## 2020-11-24 DIAGNOSIS — N2581 Secondary hyperparathyroidism of renal origin: Secondary | ICD-10-CM | POA: Diagnosis not present

## 2020-11-26 DIAGNOSIS — N2581 Secondary hyperparathyroidism of renal origin: Secondary | ICD-10-CM | POA: Diagnosis not present

## 2020-11-26 DIAGNOSIS — T8249XA Other complication of vascular dialysis catheter, initial encounter: Secondary | ICD-10-CM | POA: Diagnosis not present

## 2020-11-26 DIAGNOSIS — Z992 Dependence on renal dialysis: Secondary | ICD-10-CM | POA: Diagnosis not present

## 2020-11-26 DIAGNOSIS — N186 End stage renal disease: Secondary | ICD-10-CM | POA: Diagnosis not present

## 2020-11-26 DIAGNOSIS — D509 Iron deficiency anemia, unspecified: Secondary | ICD-10-CM | POA: Diagnosis not present

## 2020-11-26 DIAGNOSIS — D631 Anemia in chronic kidney disease: Secondary | ICD-10-CM | POA: Diagnosis not present

## 2020-11-28 ENCOUNTER — Ambulatory Visit: Payer: Medicare Other | Attending: Internal Medicine | Admitting: Internal Medicine

## 2020-11-28 ENCOUNTER — Encounter: Payer: Self-pay | Admitting: Internal Medicine

## 2020-11-28 ENCOUNTER — Other Ambulatory Visit: Payer: Self-pay

## 2020-11-28 VITALS — BP 133/77 | HR 86 | Resp 16 | Wt 241.8 lb

## 2020-11-28 DIAGNOSIS — N186 End stage renal disease: Secondary | ICD-10-CM

## 2020-11-28 DIAGNOSIS — Z86010 Personal history of colonic polyps: Secondary | ICD-10-CM | POA: Diagnosis not present

## 2020-11-28 DIAGNOSIS — I2510 Atherosclerotic heart disease of native coronary artery without angina pectoris: Secondary | ICD-10-CM | POA: Diagnosis not present

## 2020-11-28 DIAGNOSIS — Z2821 Immunization not carried out because of patient refusal: Secondary | ICD-10-CM

## 2020-11-28 DIAGNOSIS — Z1211 Encounter for screening for malignant neoplasm of colon: Secondary | ICD-10-CM

## 2020-11-28 DIAGNOSIS — N185 Chronic kidney disease, stage 5: Secondary | ICD-10-CM | POA: Diagnosis not present

## 2020-11-28 DIAGNOSIS — I10 Essential (primary) hypertension: Secondary | ICD-10-CM | POA: Diagnosis not present

## 2020-11-28 DIAGNOSIS — D631 Anemia in chronic kidney disease: Secondary | ICD-10-CM

## 2020-11-28 DIAGNOSIS — Z992 Dependence on renal dialysis: Secondary | ICD-10-CM

## 2020-11-28 NOTE — Progress Notes (Signed)
Patient ID: Seth Keller, male    DOB: [birth year]  MRN: [id number]  CC: chronic ds management  Subjective: Seth Keller is a 55 y.o. male who presents for chronic ds management His concerns today include:  Pt with hx of CAD s/p CABG x 4 (Dr. 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 Justin Mend), HTN, HL, ICM but last EF 08/2016 improved to 50-55%, 2DD,  obesity.    CKD: started HD 1 mth ago, T/TH/Sat. Working on getting on transplant list with [facility] H/H on blood test done 07/2020 with H/H 9.5/28.7.  Previous H/H 12.6/38 in January of this year.  Iron studies done in June through [facility] reviewed.  Iron 49/ferritin 123/percent saturation 16%/TIBC 302. Gets iron during HD per pt. Denies blood in stools or black stools.  Occasional constipation since started HD.  He had colonoscopy done in 2020 by Dr. Luan Pulling.  Plan was to repeat the colonoscopy in 6 months due to poor prep and patient was noted to have multiple polyps.  HYPERTENSION/CAD/HL Currently taking: see medication list.  Reports compliance with amlodipine, isosorbide, aspirin, Lipitor, metoprolol and furosemide.  He tells me that the potassium supplement was discontinued by the kidney doctor. Med Adherence: '[x]'$  Yes -except K+ supplement d/c    '[]'$  No Medication side effects: '[]'$  Yes    '[]'$  No Adherence with salt restriction: '[x]'$  Yes    '[]'$  No Home Monitoring?: '[x]'$  Yes    '[]'$  No Monitoring Frequency: once a day Home BP results range: 120s/66-70s SOB? '[]'$  Yes    '[x]'$  No Chest Pain?: '[]'$  Yes    '[x]'$  No.  No recent SL Nitro use Leg swelling?: '[]'$  Yes    '[x]'$  No Headaches?: '[]'$  Yes    '[x]'$  No Dizziness? '[]'$  Yes    '[x]'$  No Comments:   HM: Declines flu shot.  Reports having had 2 COVID shots. Declines booster.  Declines shigles vaccine for now.  Due for c-scope again with Dr. Rosalie Gums  Patient Active Problem List   Diagnosis Date Noted   Influenza vaccine refused 03/18/2020   Class 2 severe obesity due to excess calories with serious  comorbidity and body mass index (BMI) of 38.0 to 38.9 in adult [facility]) 03/18/2020   Tachy-brady syndrome ([place]) 07/06/2019   Bradycardia 09/25/2018   Colon polyps 08/14/2018   Anemia of chronic renal failure 06/04/2018   Secondary hyperparathyroidism of renal origin ([place]) 06/04/2018   Chronic bilateral low back pain without sciatica 12/22/2015   Obesity (BMI 30-39.9) 06/16/2015   SOB (shortness of breath) 09/27/2014   Left shoulder pain 09/08/2014   Coronary artery disease of bypass graft of native heart with stable angina pectoris ([place])    Ischemic cardiomyopathy    Chronic combined systolic and diastolic CHF (congestive heart failure) ([place])    CKD (chronic kidney disease), stage III ([place])    Tobacco abuse, in remission    Constipation 01/11/2014   S/P CABG x 4 01/01/2014   Essential hypertension 05/08/2012   Microhematuria 05/08/2012   Proteinuria 05/08/2012     Current Outpatient Medications on File Prior to Visit  Medication Sig Dispense Refill   amLODipine (NORVASC) 10 MG tablet Take 1 tablet by mouth once daily 90 tablet 0   aspirin EC 81 MG tablet Take 1 tablet (81 mg total) by mouth daily. 90 tablet 3   atorvastatin (LIPITOR) 80 MG tablet Take 1 tablet (80 mg total) by mouth daily. 90 tablet 1   Blood Pressure Monitor  DEVI Use as directed to check home blood pressure 2-3 times a week 1 Device 0   Cholecalciferol (VITAMIN D) 2000 units tablet Take 1 tablet (2,000 Units total) by mouth daily. 90 tablet 3   furosemide (LASIX) 40 MG tablet Take 1 tablet (40 mg total) by mouth daily. 90 tablet 1   isosorbide mononitrate (IMDUR) 60 MG 24 hr tablet Take 1 tablet (60 mg total) by mouth daily. 90 tablet 3   metoprolol tartrate (LOPRESSOR) 25 MG tablet Take 1 tablet (25 mg total) by mouth 2 (two) times daily. (Patient taking differently: Take 50 mg by mouth daily.) 180 tablet 3   nitroGLYCERIN (NITROSTAT) 0.4 MG SL tablet Place 1 tablet (0.4 mg total) under the tongue every 5 (five)  minutes as needed for chest pain. 90 tablet 3   OVER THE COUNTER MEDICATION Take 1 tablet by mouth daily. Total Beets otc supplement     No current facility-administered medications on file prior to visit.    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   From [place], originally.   Moved to [place] in 2012.    Social Determinants of Health   Financial Resource Strain: Not on file  Food Insecurity: Not on file  Transportation Needs: Not on file  Physical Activity: Not on file  Stress: Not on file  Social Connections: Not on file  Intimate Partner Violence: Not on file    Family History  Problem Relation Age of Onset   Heart disease Mother 49   Hypertension Father 99   Asthma Father    Diabetes Paternal Grandfather    Cancer Neg Hx     Past Surgical History:  Procedure Laterality Date   AV FISTULA PLACEMENT Right 08/15/2020   Procedure: RIGHT ARM RADIOCEPHALIC ARTERIOVENOUS (AV) FISTULA CREATION;  Surgeon: Cherre Robins, MD;  Location: [place];  Service: Vascular;   Laterality: Right;   CARDIAC CATHETERIZATION     CORONARY ARTERY BYPASS GRAFT N/A 01/01/2014   Procedure: CORONARY ARTERY BYPASS GRAFTING (CABG) times four using left internal mammary artery and right saphenous vein;  Surgeon: Gaye Pollack, MD;  Location: [place];  Service: Open Heart Surgery;  Laterality: N/A;   INTRAOPERATIVE TRANSESOPHAGEAL ECHOCARDIOGRAM N/A 01/01/2014   Procedure: INTRAOPERATIVE TRANSESOPHAGEAL ECHOCARDIOGRAM;  Surgeon: Gaye Pollack, MD;  Location: [place] OR;  Service: Open Heart Surgery;  Laterality: N/A;  LEFT HEART CATHETERIZATION WITH CORONARY ANGIOGRAM N/A 12/29/2013   Procedure: LEFT HEART CATHETERIZATION WITH CORONARY ANGIOGRAM;  Surgeon: Sinclair Grooms, MD;  Location: [facility] C[facility] LAB;  Service: Cardiovascular;  Laterality: N/A;   none      ROS: Review of Systems Negative except as stated above  PHYSICAL EXAM: BP 133/77   Pulse 86   Resp 16   Wt 241 lb 12.8 oz (109.7 kg)   SpO2 98%   BMI 36.77 kg/m   Wt Readings from Last 3 Encounters:  11/28/20 241 lb 12.8 oz (109.7 kg)  10/06/20 240 lb (108.9 kg)  08/15/20 257 lb (116.6 kg)    Physical Exam   General appearance - alert, well appearing, middle-age African-American male and in no distress Mental status - normal mood, behavior, speech, dress, motor activity, and thought processes Neck - supple, no significant adenopathy Chest - clear to auscultation, no wheezes, rales or rhonchi, symmetric air entry Heart - normal rate, regular rhythm, normal S1, S2, no murmurs, rubs, clicks or gallops Extremities -no lower extremity edema.  CMP Latest Ref Rng & Units 03/18/2020 07/06/2019 10/02/2018  Glucose 65 - 99 mg/dL - 103(H) 131(H)  BUN 6 - 24 mg/dL - 26(H) 17  Creatinine 0.76 - 1.27 mg/dL - 2.74(H) 2.14(H)  Sodium 134 - 144 mmol/L - 142 142  Potassium 3.5 - 5.2 mmol/L 4.8 4.5 4.1  Chloride 96 - 106 mmol/L - 106 105  CO2 20 - 29 mmol/L - 21 23  Calcium 8.7 - 10.2 mg/dL - 9.2 8.8  Total Protein 6.0 - 8.5  g/dL 6.9 - 6.7  Total Bilirubin 0.0 - 1.2 mg/dL 0.2 - 0.3  Alkaline Phos 44 - 121 IU/L 108 - 92  AST 0 - 40 IU/L 14 - 14  ALT 0 - 44 IU/L 24 - 14   Lipid Panel     Component Value Date/Time   CHOL 160 03/18/2020 1019   TRIG 136 03/18/2020 1019   HDL 29 (L) 03/18/2020 1019   CHOLHDL 5.5 (H) 03/18/2020 1019   CHOLHDL 4.9 04/14/2015 0943   VLDL 22 04/14/2015 0943   LDLCALC 106 (H) 03/18/2020 1019   LDLDIRECT 198 (H) 05/29/2012 1744    CBC    Component Value Date/Time   WBC 8.8 03/18/2020 1019   WBC 8.0 08/16/2014 1147   RBC 4.60 03/18/2020 1019   RBC 4.97 08/16/2014 1147   HGB 12.6 (L) 03/18/2020 1019   HCT 38.5 03/18/2020 1019   PLT 289 03/18/2020 1019   MCV 84 03/18/2020 1019   MCH 27.4 03/18/2020 1019   MCH 27.6 08/16/2014 1147   MCHC 32.7 03/18/2020 1019   MCHC 33.5 08/16/2014 1147   RDW 14.5 03/18/2020 1019   LYMPHSABS 3.5 12/31/2013 0315   MONOABS 1.1 (H) 12/31/2013 0315   EOSABS 0.3 12/31/2013 0315   BASOSABS 0.0 12/31/2013 0315    ASSESSMENT AND PLAN: 1. Essential hypertension Close to goal.  Continue current medications and low-salt diet.  2. Coronary artery disease involving native coronary artery of native heart without angina pectoris Stable.  Continue aspirin, beta-blocker and statin therapy.  3. ESRD on hemodialysis ([place]) Has progressed to dialysis since I last saw him.  He will continue his dialysis schedule.  4. Anemia of chronic renal failure, stage 5 (HCC) Likely due to CKD.  5. Influenza vaccination declined Recommended.  Patient declined.  6. Screening for colon cancer 7. History of colon polyps - Ambulatory referral to Gastroenterology    Patient was given  the opportunity to ask questions.  Patient verbalized understanding of the plan and was able to repeat key elements of the plan.   Orders Placed This Encounter  Procedures   Ambulatory referral to Gastroenterology     Requested Prescriptions    No prescriptions requested or  ordered in this encounter    Return in about 4 months (around 03/31/2021) for Give appt with Lurena Joiner for [facility] Wellness visit in 2 wks.Karle Plumber, MD, FACP

## 2020-11-29 DIAGNOSIS — N2581 Secondary hyperparathyroidism of renal origin: Secondary | ICD-10-CM | POA: Diagnosis not present

## 2020-11-29 DIAGNOSIS — Z992 Dependence on renal dialysis: Secondary | ICD-10-CM | POA: Diagnosis not present

## 2020-11-29 DIAGNOSIS — T8249XA Other complication of vascular dialysis catheter, initial encounter: Secondary | ICD-10-CM | POA: Diagnosis not present

## 2020-11-29 DIAGNOSIS — D509 Iron deficiency anemia, unspecified: Secondary | ICD-10-CM | POA: Diagnosis not present

## 2020-11-29 DIAGNOSIS — N186 End stage renal disease: Secondary | ICD-10-CM | POA: Diagnosis not present

## 2020-11-29 DIAGNOSIS — D631 Anemia in chronic kidney disease: Secondary | ICD-10-CM | POA: Diagnosis not present

## 2020-12-01 DIAGNOSIS — Z992 Dependence on renal dialysis: Secondary | ICD-10-CM | POA: Diagnosis not present

## 2020-12-01 DIAGNOSIS — T8249XA Other complication of vascular dialysis catheter, initial encounter: Secondary | ICD-10-CM | POA: Diagnosis not present

## 2020-12-01 DIAGNOSIS — N2581 Secondary hyperparathyroidism of renal origin: Secondary | ICD-10-CM | POA: Diagnosis not present

## 2020-12-01 DIAGNOSIS — D509 Iron deficiency anemia, unspecified: Secondary | ICD-10-CM | POA: Diagnosis not present

## 2020-12-01 DIAGNOSIS — D631 Anemia in chronic kidney disease: Secondary | ICD-10-CM | POA: Diagnosis not present

## 2020-12-01 DIAGNOSIS — N186 End stage renal disease: Secondary | ICD-10-CM | POA: Diagnosis not present

## 2020-12-03 DIAGNOSIS — N2581 Secondary hyperparathyroidism of renal origin: Secondary | ICD-10-CM | POA: Diagnosis not present

## 2020-12-03 DIAGNOSIS — D631 Anemia in chronic kidney disease: Secondary | ICD-10-CM | POA: Diagnosis not present

## 2020-12-03 DIAGNOSIS — T8249XA Other complication of vascular dialysis catheter, initial encounter: Secondary | ICD-10-CM | POA: Diagnosis not present

## 2020-12-03 DIAGNOSIS — N186 End stage renal disease: Secondary | ICD-10-CM | POA: Diagnosis not present

## 2020-12-03 DIAGNOSIS — Z992 Dependence on renal dialysis: Secondary | ICD-10-CM | POA: Diagnosis not present

## 2020-12-03 DIAGNOSIS — D509 Iron deficiency anemia, unspecified: Secondary | ICD-10-CM | POA: Diagnosis not present

## 2020-12-06 DIAGNOSIS — Z992 Dependence on renal dialysis: Secondary | ICD-10-CM | POA: Diagnosis not present

## 2020-12-06 DIAGNOSIS — D631 Anemia in chronic kidney disease: Secondary | ICD-10-CM | POA: Diagnosis not present

## 2020-12-06 DIAGNOSIS — D509 Iron deficiency anemia, unspecified: Secondary | ICD-10-CM | POA: Diagnosis not present

## 2020-12-06 DIAGNOSIS — N2581 Secondary hyperparathyroidism of renal origin: Secondary | ICD-10-CM | POA: Diagnosis not present

## 2020-12-06 DIAGNOSIS — N186 End stage renal disease: Secondary | ICD-10-CM | POA: Diagnosis not present

## 2020-12-06 DIAGNOSIS — T8249XA Other complication of vascular dialysis catheter, initial encounter: Secondary | ICD-10-CM | POA: Diagnosis not present

## 2020-12-08 DIAGNOSIS — N2581 Secondary hyperparathyroidism of renal origin: Secondary | ICD-10-CM | POA: Diagnosis not present

## 2020-12-08 DIAGNOSIS — D631 Anemia in chronic kidney disease: Secondary | ICD-10-CM | POA: Diagnosis not present

## 2020-12-08 DIAGNOSIS — D509 Iron deficiency anemia, unspecified: Secondary | ICD-10-CM | POA: Diagnosis not present

## 2020-12-08 DIAGNOSIS — T8249XA Other complication of vascular dialysis catheter, initial encounter: Secondary | ICD-10-CM | POA: Diagnosis not present

## 2020-12-08 DIAGNOSIS — Z992 Dependence on renal dialysis: Secondary | ICD-10-CM | POA: Diagnosis not present

## 2020-12-08 DIAGNOSIS — N186 End stage renal disease: Secondary | ICD-10-CM | POA: Diagnosis not present

## 2020-12-10 DIAGNOSIS — D631 Anemia in chronic kidney disease: Secondary | ICD-10-CM | POA: Diagnosis not present

## 2020-12-10 DIAGNOSIS — N2581 Secondary hyperparathyroidism of renal origin: Secondary | ICD-10-CM | POA: Diagnosis not present

## 2020-12-10 DIAGNOSIS — Z992 Dependence on renal dialysis: Secondary | ICD-10-CM | POA: Diagnosis not present

## 2020-12-10 DIAGNOSIS — D509 Iron deficiency anemia, unspecified: Secondary | ICD-10-CM | POA: Diagnosis not present

## 2020-12-10 DIAGNOSIS — T8249XA Other complication of vascular dialysis catheter, initial encounter: Secondary | ICD-10-CM | POA: Diagnosis not present

## 2020-12-10 DIAGNOSIS — N186 End stage renal disease: Secondary | ICD-10-CM | POA: Diagnosis not present

## 2020-12-13 DIAGNOSIS — D631 Anemia in chronic kidney disease: Secondary | ICD-10-CM | POA: Diagnosis not present

## 2020-12-13 DIAGNOSIS — N2581 Secondary hyperparathyroidism of renal origin: Secondary | ICD-10-CM | POA: Diagnosis not present

## 2020-12-13 DIAGNOSIS — D509 Iron deficiency anemia, unspecified: Secondary | ICD-10-CM | POA: Diagnosis not present

## 2020-12-13 DIAGNOSIS — N186 End stage renal disease: Secondary | ICD-10-CM | POA: Diagnosis not present

## 2020-12-13 DIAGNOSIS — T8249XA Other complication of vascular dialysis catheter, initial encounter: Secondary | ICD-10-CM | POA: Diagnosis not present

## 2020-12-13 DIAGNOSIS — Z992 Dependence on renal dialysis: Secondary | ICD-10-CM | POA: Diagnosis not present

## 2020-12-15 DIAGNOSIS — N186 End stage renal disease: Secondary | ICD-10-CM | POA: Diagnosis not present

## 2020-12-15 DIAGNOSIS — D509 Iron deficiency anemia, unspecified: Secondary | ICD-10-CM | POA: Diagnosis not present

## 2020-12-15 DIAGNOSIS — N2581 Secondary hyperparathyroidism of renal origin: Secondary | ICD-10-CM | POA: Diagnosis not present

## 2020-12-15 DIAGNOSIS — Z992 Dependence on renal dialysis: Secondary | ICD-10-CM | POA: Diagnosis not present

## 2020-12-15 DIAGNOSIS — D631 Anemia in chronic kidney disease: Secondary | ICD-10-CM | POA: Diagnosis not present

## 2020-12-15 DIAGNOSIS — T8249XA Other complication of vascular dialysis catheter, initial encounter: Secondary | ICD-10-CM | POA: Diagnosis not present

## 2020-12-17 DIAGNOSIS — D631 Anemia in chronic kidney disease: Secondary | ICD-10-CM | POA: Diagnosis not present

## 2020-12-17 DIAGNOSIS — N186 End stage renal disease: Secondary | ICD-10-CM | POA: Diagnosis not present

## 2020-12-17 DIAGNOSIS — Z992 Dependence on renal dialysis: Secondary | ICD-10-CM | POA: Diagnosis not present

## 2020-12-17 DIAGNOSIS — D509 Iron deficiency anemia, unspecified: Secondary | ICD-10-CM | POA: Diagnosis not present

## 2020-12-17 DIAGNOSIS — T8249XA Other complication of vascular dialysis catheter, initial encounter: Secondary | ICD-10-CM | POA: Diagnosis not present

## 2020-12-17 DIAGNOSIS — N2581 Secondary hyperparathyroidism of renal origin: Secondary | ICD-10-CM | POA: Diagnosis not present

## 2020-12-19 ENCOUNTER — Ambulatory Visit: Payer: Medicare Other | Attending: Internal Medicine | Admitting: Pharmacist

## 2020-12-19 ENCOUNTER — Encounter: Payer: Self-pay | Admitting: Pharmacist

## 2020-12-19 ENCOUNTER — Other Ambulatory Visit: Payer: Self-pay

## 2020-12-19 VITALS — BP 142/76 | HR 80 | Temp 98.3°F | Ht 69.0 in | Wt 238.6 lb

## 2020-12-19 DIAGNOSIS — I129 Hypertensive chronic kidney disease with stage 1 through stage 4 chronic kidney disease, or unspecified chronic kidney disease: Secondary | ICD-10-CM | POA: Diagnosis not present

## 2020-12-19 DIAGNOSIS — Z0000 Encounter for general adult medical examination without abnormal findings: Secondary | ICD-10-CM | POA: Diagnosis not present

## 2020-12-19 DIAGNOSIS — N186 End stage renal disease: Secondary | ICD-10-CM | POA: Diagnosis not present

## 2020-12-19 DIAGNOSIS — Z1159 Encounter for screening for other viral diseases: Secondary | ICD-10-CM

## 2020-12-19 DIAGNOSIS — Z992 Dependence on renal dialysis: Secondary | ICD-10-CM | POA: Diagnosis not present

## 2020-12-19 NOTE — Progress Notes (Signed)
Subjective:   Seth Keller is a 55 y.o. male who presents for Medicare Annual/Subsequent preventive examination.   Objective:    Today's Vitals   12/19/20 1453 12/19/20 1454  BP: (!) 142/76   Pulse: 80   Temp: 98.3 F (36.8 C)   SpO2: 97%   Weight: 238 lb 9.6 oz (108.2 kg)   Height: 5\' 9"  (1.753 m)   PainSc: 0-No pain 0-No pain   Body mass index is 35.24 kg/m.  Advanced Directives 12/19/2020 08/12/2020 08/27/2019 08/21/2016 12/22/2015 06/16/2015 09/27/2014  Does Patient Have a Medical Advance Directive? No No No No No No No  Would patient like information on creating a medical advance directive? No - Patient declined - Yes (Inpatient - patient defers creating a medical advance directive at this time - Information given) - No - patient declined information - -    Current Medications (verified) Outpatient Encounter Medications as of 12/19/2020  Medication Sig   amLODipine (NORVASC) 10 MG tablet Take 1 tablet by mouth once daily   aspirin EC 81 MG tablet Take 1 tablet (81 mg total) by mouth daily.   atorvastatin (LIPITOR) 80 MG tablet Take 1 tablet (80 mg total) by mouth daily.   Blood Pressure Monitor DEVI Use as directed to check home blood pressure 2-3 times a week   Cholecalciferol (VITAMIN D) 2000 units tablet Take 1 tablet (2,000 Units total) by mouth daily.   furosemide (LASIX) 40 MG tablet Take 1 tablet (40 mg total) by mouth daily.   isosorbide mononitrate (IMDUR) 60 MG 24 hr tablet Take 1 tablet (60 mg total) by mouth daily.   metoprolol tartrate (LOPRESSOR) 25 MG tablet Take 1 tablet (25 mg total) by mouth 2 (two) times daily. (Patient taking differently: Take 50 mg by mouth daily.)   nitroGLYCERIN (NITROSTAT) 0.4 MG SL tablet Place 1 tablet (0.4 mg total) under the tongue every 5 (five) minutes as needed for chest pain.   OVER THE COUNTER MEDICATION Take 1 tablet by mouth daily. Total Beets otc supplement   No facility-administered encounter medications on file as of 12/19/2020.     Allergies (verified) Iodinated diagnostic agents and Shellfish allergy   History: Past Medical History:  Diagnosis Date   Atrial fibrillation with RVR ([place])    a. 12/2013 post-op CABG ->on amio.   CAD (coronary artery disease)    a. 12/2013 NSTEMI/Cath: 3VD;  b. 12/2013 CABG x 4: LIMA->LAD, VG->RI, VG->OM1->LPDA.   Chronic combined systolic and diastolic CHF (congestive heart failure) ([place])    a. 12/2013 Echo: EF 45%, Gr 2 DD.   CKD (chronic kidney disease), stage III (HCC)    Dyspnea    Hypertension    a. Dx ~ 2000.   Ischemic cardiomyopathy    a. 12/2013 Echo: EF 45%, Gr 2 DD, basal-mid inflat and inf AK, mild MR.   Myocardial infarction [facility])    Tobacco abuse    Past Surgical History:  Procedure Laterality Date   AV FISTULA PLACEMENT Right 08/15/2020   Procedure: RIGHT ARM RADIOCEPHALIC ARTERIOVENOUS (AV) FISTULA CREATION;  Surgeon: Cherre Robins, MD;  Location: [place];  Service: Vascular;  Laterality: Right;   CARDIAC CATHETERIZATION     CORONARY ARTERY BYPASS GRAFT N/A 01/01/2014   Procedure: CORONARY ARTERY BYPASS GRAFTING (CABG) times four using left internal mammary artery and right saphenous vein;  Surgeon: Gaye Pollack, MD;  Location: [place] OR;  Service: Open Heart Surgery;  Laterality: N/A;   INTRAOPERATIVE TRANSESOPHAGEAL ECHOCARDIOGRAM N/A 01/01/2014  Procedure: INTRAOPERATIVE TRANSESOPHAGEAL ECHOCARDIOGRAM;  Surgeon: Gaye Pollack, MD;  Location: [facility] OR;  Service: Open Heart Surgery;  Laterality: N/A;   LEFT HEART CATHETERIZATION WITH CORONARY ANGIOGRAM N/A 12/29/2013   Procedure: LEFT HEART CATHETERIZATION WITH CORONARY ANGIOGRAM;  Surgeon: Sinclair Grooms, MD;  Location: [facility] CATH LAB;  Service: Cardiovascular;  Laterality: N/A;   none     Family History  Problem Relation Age of Onset   Heart disease Mother 37   Hypertension Father 68   Asthma Father    Diabetes Paternal Grandfather    Cancer Neg Hx    Social History   Socioeconomic History    Marital status: Single    Spouse name: Not on file   Number of children: 0   Years of education: 12   Highest education level: Not on file  Occupational History   Occupation: Unemployed    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   From [place], originally.   Moved to [place] in 2012.    Social Determinants of Health   Financial Resource Strain: Not on file  Food Insecurity: Not on file  Transportation Needs: Not on file  Physical Activity: Not on file  Stress: Not on file  Social Connections: Not on file    Tobacco Counseling Counseling given: Yes   Clinical Intake:  Pre-visit preparation completed: No  Pain : No/denies pain Pain Score: 0-No pain     BMI - recorded: 35.24 Nutritional Status: BMI > 30  Obese Diabetes: No  How often do you need to have someone help you when you read instructions, pamphlets, or other written materials from your doctor or pharmacy?: 1 - Never  Diabetic? No  Interpreter Needed?: No      Activities of Daily Living In your present state of health, do you have any difficulty performing the following activities: 12/19/2020 08/15/2020  Hearing? N N  Vision? - N  Difficulty concentrating or making decisions? N N  Walking or climbing stairs? Y N  Dressing or bathing? N N  Doing errands, shopping? N -  Preparing Food and eating ? N -  Using the Toilet? N -  Do you have problems with loss of  bowel control? N -  Managing your Medications? N -  Managing your Finances? N -  Housekeeping or managing your Housekeeping? N -  Some recent data might be hidden    Patient Care Team: Ladell Pier, MD as PCP - General (Internal Medicine) Burnell Blanks, MD as PCP - Cardiology (Cardiology) [facility] [place] any recent Medical Services you may have received from other than [facility] providers in the past year (date may be approximate).     Assessment:   This is a routine wellness examination for [place].  Hearing/Vision screen  No results found.  Dietary issues and exercise activities discussed: Current Exercise Habits: The patient does not participate in regular exercise at present   Goals Addressed   None   Depression Screen PHQ 2/9 Scores 12/19/2020 11/28/2020 03/18/2020 08/27/2019 07/06/2019 12/08/2018 10/25/2017  PHQ - 2 Score 0 0 0 0 0 0 1  PHQ- 9 Score - - - 0 1 - -    Fall Risk Fall Risk  12/19/2020 11/28/2020 03/18/2020 08/27/2019 01/29/2019  Falls in the past year? 0 0 0 0 0  Number falls in past yr: 0 0 0 0 -  Injury with Fall? 0 0 0 0 -  Risk for fall due to : - No Fall Risks - Impaired mobility -  Follow up Falls evaluation completed;Education provided - - Education provided -    FALL RISK PREVENTION PERTAINING TO THE HOME:  Any stairs in or around the home? Yes  If so, are there any without handrails? No  Home free of loose throw rugs in walkways, pet beds, electrical cords, etc? Yes  Adequate lighting in your home to reduce risk of falls? Yes   ASSISTIVE DEVICES UTILIZED TO PREVENT FALLS:  Life alert? No  Use of a cane, walker or w/c? No  Grab bars in the bathroom? No  Shower chair or bench in shower? No  Elevated toilet seat or a handicapped toilet? No   TIMED UP AND GO:  Was the test performed? Yes .  Length of time to ambulate 10 feet: 5 sec.   Gait steady and fast without use of assistive  device  Cognitive Function: MMSE - Mini Mental State Exam 12/19/2020 08/27/2019  Orientation to time 5 5  Orientation to Place 5 5  Registration 3 3  Attention/ Calculation 5 5  Recall 3 3  Language- name 2 objects 2 2  Language- repeat 1 1  Language- follow 3 step command 3 3  Language- read & follow direction 1 1  Write a sentence 1 1  Copy design 1 1  Total score 30 30        Immunizations Immunization History  Administered Date(s) Administered   Influenza,inj,Quad PF,6+ Mos 01/11/2014   PFIZER Comirnaty(Gray Top)Covid-19 Tri-Sucrose Vaccine 06/08/2020   Tdap 01/11/2014    TDAP status: Up to date  Flu Vaccine status: Declined, Education has been provided regarding the importance of this vaccine but patient still declined. Advised may receive this vaccine at local pharmacy or Health Dept. Aware to provide a copy of the vaccination record if obtained from local pharmacy or Health Dept. Verbalized acceptance and understanding.  Pneumococcal vaccine status: Declined,  Education has been provided regarding the importance of this vaccine but patient still declined. Advised may receive this vaccine at local pharmacy or Health Dept. Aware to provide a copy of the vaccination record if obtained from local pharmacy or Health Dept. Verbalized acceptance and understanding.   Covid-19 vaccine status: Declined, Education has been provided regarding the importance of this vaccine but patient still declined. Advised may receive this vaccine at local pharmacy or Health Dept.or vaccine clinic. Aware to provide a copy of the vaccination record if obtained from local pharmacy or Health Dept. Verbalized acceptance and understanding.  Qualifies for Shingles Vaccine? Yes   Shingrix Completed?: No.    Education has been provided regarding the importance of this vaccine. Patient has been advised to call insurance company to determine out of pocket expense if they have not yet received this vaccine.  Advised may also  receive vaccine at local pharmacy or Health Dept. Verbalized acceptance and understanding.  Screening Tests Health Maintenance  Topic Date Due   Pneumococcal Vaccine 48-55 Years old (1 - PCV) Never done   Hepatitis C Screening  Never done   COLONOSCOPY (Pts 45-30yrs Insurance coverage will need to be confirmed)  04/11/2020   COVID-19 Vaccine (2 - Pfizer series) 06/29/2020   Zoster Vaccines- Shingrix (1 of 2) 02/28/2021 (Originally [birth year]1984)   INFLUENZA VACCINE  05/19/2021 (Originally 09/19/2020)   TETANUS/TDAP  01/12/2024   HIV Screening  Completed   HPV VACCINES  Aged Out    Health Maintenance  Health Maintenance Due  Topic Date Due   Pneumococcal Vaccine 43-106 Years old (1 - PCV) Never done   Hepatitis C Screening  Never done   COLONOSCOPY (Pts 45-97yrs Insurance coverage will need to be confirmed)  04/11/2020   COVID-19 Vaccine (2 - Pfizer series) 06/29/2020    Colorectal cancer screening: Referral to GI placed at last PCP visit. Pt aware the office will call re: appt.  Lung Cancer Screening: (Low Dose CT Chest recommended if Age 17-80 years, 30 pack-year currently smoking OR have quit w/in 15years.) does not qualify.   Lung Cancer Screening Referral: None  Additional Screening:  Hepatitis C Screening: does qualify; test ordered today  Vision Screening: Recommended annual ophthalmology exams for early detection of glaucoma and other disorders of the eye. Is the patient up to date with their annual eye exam?  No  Who is the provider or what is the name of the office in which the patient attends annual eye exams? None If pt is not established with a provider, would they like to be referred to a provider to establish care? No .   Dental Screening: Recommended annual dental exams for proper oral hygiene  Community Resource Referral / Chronic Care Management: CRR required this visit?  No   CCM required this visit?  No      Plan:     I have personally  reviewed and noted the following in the patient's chart:   Medical and social history Use of alcohol, tobacco or illicit drugs  Current medications and supplements including opioid prescriptions. Patient is not currently taking opioid prescriptions. Functional ability and status Nutritional status Physical activity Advanced directives List of other physicians Hospitalizations, surgeries, and ER visits in previous 12 months Vitals Screenings to include cognitive, depression, and falls Referrals and appointments  In addition, I have reviewed and discussed with patient certain preventive protocols, quality metrics, and best practice recommendations. A written personalized care plan for preventive services as well as general preventive health recommendations were provided to patient.     Tresa Endo, RPH-CPP   12/19/2020

## 2020-12-20 DIAGNOSIS — N2581 Secondary hyperparathyroidism of renal origin: Secondary | ICD-10-CM | POA: Diagnosis not present

## 2020-12-20 DIAGNOSIS — D509 Iron deficiency anemia, unspecified: Secondary | ICD-10-CM | POA: Diagnosis not present

## 2020-12-20 DIAGNOSIS — N186 End stage renal disease: Secondary | ICD-10-CM | POA: Diagnosis not present

## 2020-12-20 DIAGNOSIS — D631 Anemia in chronic kidney disease: Secondary | ICD-10-CM | POA: Diagnosis not present

## 2020-12-20 DIAGNOSIS — Z992 Dependence on renal dialysis: Secondary | ICD-10-CM | POA: Diagnosis not present

## 2020-12-20 DIAGNOSIS — T8249XA Other complication of vascular dialysis catheter, initial encounter: Secondary | ICD-10-CM | POA: Diagnosis not present

## 2020-12-20 LAB — HCV AB W REFLEX TO QUANT PCR: HCV Ab: <0.1 s/co ratio (ref 0.0–0.9)

## 2020-12-20 LAB — HCV INTERPRETATION

## 2020-12-22 DIAGNOSIS — D631 Anemia in chronic kidney disease: Secondary | ICD-10-CM | POA: Diagnosis not present

## 2020-12-22 DIAGNOSIS — N186 End stage renal disease: Secondary | ICD-10-CM | POA: Diagnosis not present

## 2020-12-22 DIAGNOSIS — T8249XA Other complication of vascular dialysis catheter, initial encounter: Secondary | ICD-10-CM | POA: Diagnosis not present

## 2020-12-22 DIAGNOSIS — N2581 Secondary hyperparathyroidism of renal origin: Secondary | ICD-10-CM | POA: Diagnosis not present

## 2020-12-22 DIAGNOSIS — Z992 Dependence on renal dialysis: Secondary | ICD-10-CM | POA: Diagnosis not present

## 2020-12-22 DIAGNOSIS — D509 Iron deficiency anemia, unspecified: Secondary | ICD-10-CM | POA: Diagnosis not present

## 2020-12-23 DIAGNOSIS — N186 End stage renal disease: Secondary | ICD-10-CM | POA: Diagnosis not present

## 2020-12-23 DIAGNOSIS — Z452 Encounter for adjustment and management of vascular access device: Secondary | ICD-10-CM | POA: Diagnosis not present

## 2020-12-23 DIAGNOSIS — Z992 Dependence on renal dialysis: Secondary | ICD-10-CM | POA: Diagnosis not present

## 2020-12-24 DIAGNOSIS — T8249XA Other complication of vascular dialysis catheter, initial encounter: Secondary | ICD-10-CM | POA: Diagnosis not present

## 2020-12-24 DIAGNOSIS — N186 End stage renal disease: Secondary | ICD-10-CM | POA: Diagnosis not present

## 2020-12-24 DIAGNOSIS — N2581 Secondary hyperparathyroidism of renal origin: Secondary | ICD-10-CM | POA: Diagnosis not present

## 2020-12-24 DIAGNOSIS — D631 Anemia in chronic kidney disease: Secondary | ICD-10-CM | POA: Diagnosis not present

## 2020-12-24 DIAGNOSIS — Z992 Dependence on renal dialysis: Secondary | ICD-10-CM | POA: Diagnosis not present

## 2020-12-24 DIAGNOSIS — D509 Iron deficiency anemia, unspecified: Secondary | ICD-10-CM | POA: Diagnosis not present

## 2020-12-27 DIAGNOSIS — T8249XA Other complication of vascular dialysis catheter, initial encounter: Secondary | ICD-10-CM | POA: Diagnosis not present

## 2020-12-27 DIAGNOSIS — N2581 Secondary hyperparathyroidism of renal origin: Secondary | ICD-10-CM | POA: Diagnosis not present

## 2020-12-27 DIAGNOSIS — D509 Iron deficiency anemia, unspecified: Secondary | ICD-10-CM | POA: Diagnosis not present

## 2020-12-27 DIAGNOSIS — D631 Anemia in chronic kidney disease: Secondary | ICD-10-CM | POA: Diagnosis not present

## 2020-12-27 DIAGNOSIS — Z992 Dependence on renal dialysis: Secondary | ICD-10-CM | POA: Diagnosis not present

## 2020-12-27 DIAGNOSIS — N186 End stage renal disease: Secondary | ICD-10-CM | POA: Diagnosis not present

## 2020-12-29 DIAGNOSIS — Z992 Dependence on renal dialysis: Secondary | ICD-10-CM | POA: Diagnosis not present

## 2020-12-29 DIAGNOSIS — N186 End stage renal disease: Secondary | ICD-10-CM | POA: Diagnosis not present

## 2020-12-29 DIAGNOSIS — T8249XA Other complication of vascular dialysis catheter, initial encounter: Secondary | ICD-10-CM | POA: Diagnosis not present

## 2020-12-29 DIAGNOSIS — D631 Anemia in chronic kidney disease: Secondary | ICD-10-CM | POA: Diagnosis not present

## 2020-12-29 DIAGNOSIS — D509 Iron deficiency anemia, unspecified: Secondary | ICD-10-CM | POA: Diagnosis not present

## 2020-12-29 DIAGNOSIS — N2581 Secondary hyperparathyroidism of renal origin: Secondary | ICD-10-CM | POA: Diagnosis not present

## 2021-01-03 DIAGNOSIS — D509 Iron deficiency anemia, unspecified: Secondary | ICD-10-CM | POA: Diagnosis not present

## 2021-01-03 DIAGNOSIS — N2581 Secondary hyperparathyroidism of renal origin: Secondary | ICD-10-CM | POA: Diagnosis not present

## 2021-01-03 DIAGNOSIS — T8249XA Other complication of vascular dialysis catheter, initial encounter: Secondary | ICD-10-CM | POA: Diagnosis not present

## 2021-01-03 DIAGNOSIS — D631 Anemia in chronic kidney disease: Secondary | ICD-10-CM | POA: Diagnosis not present

## 2021-01-03 DIAGNOSIS — Z992 Dependence on renal dialysis: Secondary | ICD-10-CM | POA: Diagnosis not present

## 2021-01-03 DIAGNOSIS — N186 End stage renal disease: Secondary | ICD-10-CM | POA: Diagnosis not present

## 2021-01-05 DIAGNOSIS — N2581 Secondary hyperparathyroidism of renal origin: Secondary | ICD-10-CM | POA: Diagnosis not present

## 2021-01-05 DIAGNOSIS — D509 Iron deficiency anemia, unspecified: Secondary | ICD-10-CM | POA: Diagnosis not present

## 2021-01-05 DIAGNOSIS — N186 End stage renal disease: Secondary | ICD-10-CM | POA: Diagnosis not present

## 2021-01-05 DIAGNOSIS — T8249XA Other complication of vascular dialysis catheter, initial encounter: Secondary | ICD-10-CM | POA: Diagnosis not present

## 2021-01-05 DIAGNOSIS — D631 Anemia in chronic kidney disease: Secondary | ICD-10-CM | POA: Diagnosis not present

## 2021-01-05 DIAGNOSIS — Z992 Dependence on renal dialysis: Secondary | ICD-10-CM | POA: Diagnosis not present

## 2021-01-07 DIAGNOSIS — N2581 Secondary hyperparathyroidism of renal origin: Secondary | ICD-10-CM | POA: Diagnosis not present

## 2021-01-07 DIAGNOSIS — Z992 Dependence on renal dialysis: Secondary | ICD-10-CM | POA: Diagnosis not present

## 2021-01-07 DIAGNOSIS — T8249XA Other complication of vascular dialysis catheter, initial encounter: Secondary | ICD-10-CM | POA: Diagnosis not present

## 2021-01-07 DIAGNOSIS — N186 End stage renal disease: Secondary | ICD-10-CM | POA: Diagnosis not present

## 2021-01-07 DIAGNOSIS — D509 Iron deficiency anemia, unspecified: Secondary | ICD-10-CM | POA: Diagnosis not present

## 2021-01-07 DIAGNOSIS — D631 Anemia in chronic kidney disease: Secondary | ICD-10-CM | POA: Diagnosis not present

## 2021-01-10 DIAGNOSIS — D631 Anemia in chronic kidney disease: Secondary | ICD-10-CM | POA: Diagnosis not present

## 2021-01-10 DIAGNOSIS — T8249XA Other complication of vascular dialysis catheter, initial encounter: Secondary | ICD-10-CM | POA: Diagnosis not present

## 2021-01-10 DIAGNOSIS — N2581 Secondary hyperparathyroidism of renal origin: Secondary | ICD-10-CM | POA: Diagnosis not present

## 2021-01-10 DIAGNOSIS — N186 End stage renal disease: Secondary | ICD-10-CM | POA: Diagnosis not present

## 2021-01-10 DIAGNOSIS — D509 Iron deficiency anemia, unspecified: Secondary | ICD-10-CM | POA: Diagnosis not present

## 2021-01-10 DIAGNOSIS — Z992 Dependence on renal dialysis: Secondary | ICD-10-CM | POA: Diagnosis not present

## 2021-01-13 DIAGNOSIS — T8249XA Other complication of vascular dialysis catheter, initial encounter: Secondary | ICD-10-CM | POA: Diagnosis not present

## 2021-01-13 DIAGNOSIS — Z992 Dependence on renal dialysis: Secondary | ICD-10-CM | POA: Diagnosis not present

## 2021-01-13 DIAGNOSIS — D509 Iron deficiency anemia, unspecified: Secondary | ICD-10-CM | POA: Diagnosis not present

## 2021-01-13 DIAGNOSIS — N186 End stage renal disease: Secondary | ICD-10-CM | POA: Diagnosis not present

## 2021-01-13 DIAGNOSIS — N2581 Secondary hyperparathyroidism of renal origin: Secondary | ICD-10-CM | POA: Diagnosis not present

## 2021-01-13 DIAGNOSIS — D631 Anemia in chronic kidney disease: Secondary | ICD-10-CM | POA: Diagnosis not present

## 2021-01-15 DIAGNOSIS — T8249XA Other complication of vascular dialysis catheter, initial encounter: Secondary | ICD-10-CM | POA: Diagnosis not present

## 2021-01-15 DIAGNOSIS — N2581 Secondary hyperparathyroidism of renal origin: Secondary | ICD-10-CM | POA: Diagnosis not present

## 2021-01-15 DIAGNOSIS — N186 End stage renal disease: Secondary | ICD-10-CM | POA: Diagnosis not present

## 2021-01-15 DIAGNOSIS — Z992 Dependence on renal dialysis: Secondary | ICD-10-CM | POA: Diagnosis not present

## 2021-01-15 DIAGNOSIS — D509 Iron deficiency anemia, unspecified: Secondary | ICD-10-CM | POA: Diagnosis not present

## 2021-01-15 DIAGNOSIS — D631 Anemia in chronic kidney disease: Secondary | ICD-10-CM | POA: Diagnosis not present

## 2021-01-17 DIAGNOSIS — N186 End stage renal disease: Secondary | ICD-10-CM | POA: Diagnosis not present

## 2021-01-17 DIAGNOSIS — T8249XA Other complication of vascular dialysis catheter, initial encounter: Secondary | ICD-10-CM | POA: Diagnosis not present

## 2021-01-17 DIAGNOSIS — N2581 Secondary hyperparathyroidism of renal origin: Secondary | ICD-10-CM | POA: Diagnosis not present

## 2021-01-17 DIAGNOSIS — Z992 Dependence on renal dialysis: Secondary | ICD-10-CM | POA: Diagnosis not present

## 2021-01-17 DIAGNOSIS — D631 Anemia in chronic kidney disease: Secondary | ICD-10-CM | POA: Diagnosis not present

## 2021-01-17 DIAGNOSIS — D509 Iron deficiency anemia, unspecified: Secondary | ICD-10-CM | POA: Diagnosis not present

## 2021-01-18 DIAGNOSIS — N186 End stage renal disease: Secondary | ICD-10-CM | POA: Diagnosis not present

## 2021-01-18 DIAGNOSIS — I129 Hypertensive chronic kidney disease with stage 1 through stage 4 chronic kidney disease, or unspecified chronic kidney disease: Secondary | ICD-10-CM | POA: Diagnosis not present

## 2021-01-18 DIAGNOSIS — Z992 Dependence on renal dialysis: Secondary | ICD-10-CM | POA: Diagnosis not present

## 2021-01-19 DIAGNOSIS — N2581 Secondary hyperparathyroidism of renal origin: Secondary | ICD-10-CM | POA: Diagnosis not present

## 2021-01-19 DIAGNOSIS — N186 End stage renal disease: Secondary | ICD-10-CM | POA: Diagnosis not present

## 2021-01-19 DIAGNOSIS — D631 Anemia in chronic kidney disease: Secondary | ICD-10-CM | POA: Diagnosis not present

## 2021-01-19 DIAGNOSIS — D509 Iron deficiency anemia, unspecified: Secondary | ICD-10-CM | POA: Diagnosis not present

## 2021-01-19 DIAGNOSIS — Z992 Dependence on renal dialysis: Secondary | ICD-10-CM | POA: Diagnosis not present

## 2021-01-21 DIAGNOSIS — D509 Iron deficiency anemia, unspecified: Secondary | ICD-10-CM | POA: Diagnosis not present

## 2021-01-21 DIAGNOSIS — N186 End stage renal disease: Secondary | ICD-10-CM | POA: Diagnosis not present

## 2021-01-21 DIAGNOSIS — N2581 Secondary hyperparathyroidism of renal origin: Secondary | ICD-10-CM | POA: Diagnosis not present

## 2021-01-21 DIAGNOSIS — D631 Anemia in chronic kidney disease: Secondary | ICD-10-CM | POA: Diagnosis not present

## 2021-01-21 DIAGNOSIS — Z992 Dependence on renal dialysis: Secondary | ICD-10-CM | POA: Diagnosis not present

## 2021-01-24 DIAGNOSIS — Z992 Dependence on renal dialysis: Secondary | ICD-10-CM | POA: Diagnosis not present

## 2021-01-24 DIAGNOSIS — D631 Anemia in chronic kidney disease: Secondary | ICD-10-CM | POA: Diagnosis not present

## 2021-01-24 DIAGNOSIS — D509 Iron deficiency anemia, unspecified: Secondary | ICD-10-CM | POA: Diagnosis not present

## 2021-01-24 DIAGNOSIS — N2581 Secondary hyperparathyroidism of renal origin: Secondary | ICD-10-CM | POA: Diagnosis not present

## 2021-01-24 DIAGNOSIS — N186 End stage renal disease: Secondary | ICD-10-CM | POA: Diagnosis not present

## 2021-01-26 DIAGNOSIS — N2581 Secondary hyperparathyroidism of renal origin: Secondary | ICD-10-CM | POA: Diagnosis not present

## 2021-01-26 DIAGNOSIS — Z992 Dependence on renal dialysis: Secondary | ICD-10-CM | POA: Diagnosis not present

## 2021-01-26 DIAGNOSIS — N186 End stage renal disease: Secondary | ICD-10-CM | POA: Diagnosis not present

## 2021-01-26 DIAGNOSIS — D509 Iron deficiency anemia, unspecified: Secondary | ICD-10-CM | POA: Diagnosis not present

## 2021-01-26 DIAGNOSIS — D631 Anemia in chronic kidney disease: Secondary | ICD-10-CM | POA: Diagnosis not present

## 2021-01-28 DIAGNOSIS — D631 Anemia in chronic kidney disease: Secondary | ICD-10-CM | POA: Diagnosis not present

## 2021-01-28 DIAGNOSIS — N2581 Secondary hyperparathyroidism of renal origin: Secondary | ICD-10-CM | POA: Diagnosis not present

## 2021-01-28 DIAGNOSIS — N186 End stage renal disease: Secondary | ICD-10-CM | POA: Diagnosis not present

## 2021-01-28 DIAGNOSIS — Z992 Dependence on renal dialysis: Secondary | ICD-10-CM | POA: Diagnosis not present

## 2021-01-28 DIAGNOSIS — D509 Iron deficiency anemia, unspecified: Secondary | ICD-10-CM | POA: Diagnosis not present

## 2021-01-31 DIAGNOSIS — D509 Iron deficiency anemia, unspecified: Secondary | ICD-10-CM | POA: Diagnosis not present

## 2021-01-31 DIAGNOSIS — N186 End stage renal disease: Secondary | ICD-10-CM | POA: Diagnosis not present

## 2021-01-31 DIAGNOSIS — Z992 Dependence on renal dialysis: Secondary | ICD-10-CM | POA: Diagnosis not present

## 2021-01-31 DIAGNOSIS — D631 Anemia in chronic kidney disease: Secondary | ICD-10-CM | POA: Diagnosis not present

## 2021-01-31 DIAGNOSIS — N2581 Secondary hyperparathyroidism of renal origin: Secondary | ICD-10-CM | POA: Diagnosis not present

## 2021-02-02 DIAGNOSIS — N186 End stage renal disease: Secondary | ICD-10-CM | POA: Diagnosis not present

## 2021-02-02 DIAGNOSIS — D509 Iron deficiency anemia, unspecified: Secondary | ICD-10-CM | POA: Diagnosis not present

## 2021-02-02 DIAGNOSIS — N2581 Secondary hyperparathyroidism of renal origin: Secondary | ICD-10-CM | POA: Diagnosis not present

## 2021-02-02 DIAGNOSIS — D631 Anemia in chronic kidney disease: Secondary | ICD-10-CM | POA: Diagnosis not present

## 2021-02-02 DIAGNOSIS — Z992 Dependence on renal dialysis: Secondary | ICD-10-CM | POA: Diagnosis not present

## 2021-02-04 DIAGNOSIS — N186 End stage renal disease: Secondary | ICD-10-CM | POA: Diagnosis not present

## 2021-02-04 DIAGNOSIS — N2581 Secondary hyperparathyroidism of renal origin: Secondary | ICD-10-CM | POA: Diagnosis not present

## 2021-02-04 DIAGNOSIS — D631 Anemia in chronic kidney disease: Secondary | ICD-10-CM | POA: Diagnosis not present

## 2021-02-04 DIAGNOSIS — D509 Iron deficiency anemia, unspecified: Secondary | ICD-10-CM | POA: Diagnosis not present

## 2021-02-04 DIAGNOSIS — Z992 Dependence on renal dialysis: Secondary | ICD-10-CM | POA: Diagnosis not present

## 2021-02-07 DIAGNOSIS — Z992 Dependence on renal dialysis: Secondary | ICD-10-CM | POA: Diagnosis not present

## 2021-02-07 DIAGNOSIS — N186 End stage renal disease: Secondary | ICD-10-CM | POA: Diagnosis not present

## 2021-02-07 DIAGNOSIS — D509 Iron deficiency anemia, unspecified: Secondary | ICD-10-CM | POA: Diagnosis not present

## 2021-02-07 DIAGNOSIS — N2581 Secondary hyperparathyroidism of renal origin: Secondary | ICD-10-CM | POA: Diagnosis not present

## 2021-02-07 DIAGNOSIS — D631 Anemia in chronic kidney disease: Secondary | ICD-10-CM | POA: Diagnosis not present

## 2021-02-09 DIAGNOSIS — D509 Iron deficiency anemia, unspecified: Secondary | ICD-10-CM | POA: Diagnosis not present

## 2021-02-09 DIAGNOSIS — N186 End stage renal disease: Secondary | ICD-10-CM | POA: Diagnosis not present

## 2021-02-09 DIAGNOSIS — Z992 Dependence on renal dialysis: Secondary | ICD-10-CM | POA: Diagnosis not present

## 2021-02-09 DIAGNOSIS — D631 Anemia in chronic kidney disease: Secondary | ICD-10-CM | POA: Diagnosis not present

## 2021-02-09 DIAGNOSIS — N2581 Secondary hyperparathyroidism of renal origin: Secondary | ICD-10-CM | POA: Diagnosis not present

## 2021-02-11 DIAGNOSIS — N186 End stage renal disease: Secondary | ICD-10-CM | POA: Diagnosis not present

## 2021-02-11 DIAGNOSIS — D631 Anemia in chronic kidney disease: Secondary | ICD-10-CM | POA: Diagnosis not present

## 2021-02-11 DIAGNOSIS — N2581 Secondary hyperparathyroidism of renal origin: Secondary | ICD-10-CM | POA: Diagnosis not present

## 2021-02-11 DIAGNOSIS — Z992 Dependence on renal dialysis: Secondary | ICD-10-CM | POA: Diagnosis not present

## 2021-02-11 DIAGNOSIS — D509 Iron deficiency anemia, unspecified: Secondary | ICD-10-CM | POA: Diagnosis not present

## 2021-02-14 DIAGNOSIS — D509 Iron deficiency anemia, unspecified: Secondary | ICD-10-CM | POA: Diagnosis not present

## 2021-02-14 DIAGNOSIS — N186 End stage renal disease: Secondary | ICD-10-CM | POA: Diagnosis not present

## 2021-02-14 DIAGNOSIS — N2581 Secondary hyperparathyroidism of renal origin: Secondary | ICD-10-CM | POA: Diagnosis not present

## 2021-02-14 DIAGNOSIS — Z992 Dependence on renal dialysis: Secondary | ICD-10-CM | POA: Diagnosis not present

## 2021-02-14 DIAGNOSIS — D631 Anemia in chronic kidney disease: Secondary | ICD-10-CM | POA: Diagnosis not present

## 2021-02-16 DIAGNOSIS — N2581 Secondary hyperparathyroidism of renal origin: Secondary | ICD-10-CM | POA: Diagnosis not present

## 2021-02-16 DIAGNOSIS — Z992 Dependence on renal dialysis: Secondary | ICD-10-CM | POA: Diagnosis not present

## 2021-02-16 DIAGNOSIS — D631 Anemia in chronic kidney disease: Secondary | ICD-10-CM | POA: Diagnosis not present

## 2021-02-16 DIAGNOSIS — N186 End stage renal disease: Secondary | ICD-10-CM | POA: Diagnosis not present

## 2021-02-16 DIAGNOSIS — D509 Iron deficiency anemia, unspecified: Secondary | ICD-10-CM | POA: Diagnosis not present

## 2021-02-18 DIAGNOSIS — N186 End stage renal disease: Secondary | ICD-10-CM | POA: Diagnosis not present

## 2021-02-18 DIAGNOSIS — N2581 Secondary hyperparathyroidism of renal origin: Secondary | ICD-10-CM | POA: Diagnosis not present

## 2021-02-18 DIAGNOSIS — D509 Iron deficiency anemia, unspecified: Secondary | ICD-10-CM | POA: Diagnosis not present

## 2021-02-18 DIAGNOSIS — Z992 Dependence on renal dialysis: Secondary | ICD-10-CM | POA: Diagnosis not present

## 2021-02-18 DIAGNOSIS — D631 Anemia in chronic kidney disease: Secondary | ICD-10-CM | POA: Diagnosis not present

## 2021-02-18 DIAGNOSIS — I129 Hypertensive chronic kidney disease with stage 1 through stage 4 chronic kidney disease, or unspecified chronic kidney disease: Secondary | ICD-10-CM | POA: Diagnosis not present

## 2021-02-21 DIAGNOSIS — D509 Iron deficiency anemia, unspecified: Secondary | ICD-10-CM | POA: Diagnosis not present

## 2021-02-21 DIAGNOSIS — N2581 Secondary hyperparathyroidism of renal origin: Secondary | ICD-10-CM | POA: Diagnosis not present

## 2021-02-21 DIAGNOSIS — Z992 Dependence on renal dialysis: Secondary | ICD-10-CM | POA: Diagnosis not present

## 2021-02-21 DIAGNOSIS — N186 End stage renal disease: Secondary | ICD-10-CM | POA: Diagnosis not present

## 2021-02-21 DIAGNOSIS — D631 Anemia in chronic kidney disease: Secondary | ICD-10-CM | POA: Diagnosis not present

## 2021-02-23 DIAGNOSIS — N2581 Secondary hyperparathyroidism of renal origin: Secondary | ICD-10-CM | POA: Diagnosis not present

## 2021-02-23 DIAGNOSIS — N186 End stage renal disease: Secondary | ICD-10-CM | POA: Diagnosis not present

## 2021-02-23 DIAGNOSIS — Z992 Dependence on renal dialysis: Secondary | ICD-10-CM | POA: Diagnosis not present

## 2021-02-23 DIAGNOSIS — D631 Anemia in chronic kidney disease: Secondary | ICD-10-CM | POA: Diagnosis not present

## 2021-02-23 DIAGNOSIS — D509 Iron deficiency anemia, unspecified: Secondary | ICD-10-CM | POA: Diagnosis not present

## 2021-02-25 DIAGNOSIS — Z992 Dependence on renal dialysis: Secondary | ICD-10-CM | POA: Diagnosis not present

## 2021-02-25 DIAGNOSIS — D509 Iron deficiency anemia, unspecified: Secondary | ICD-10-CM | POA: Diagnosis not present

## 2021-02-25 DIAGNOSIS — N186 End stage renal disease: Secondary | ICD-10-CM | POA: Diagnosis not present

## 2021-02-25 DIAGNOSIS — N2581 Secondary hyperparathyroidism of renal origin: Secondary | ICD-10-CM | POA: Diagnosis not present

## 2021-02-25 DIAGNOSIS — D631 Anemia in chronic kidney disease: Secondary | ICD-10-CM | POA: Diagnosis not present

## 2021-02-28 DIAGNOSIS — D509 Iron deficiency anemia, unspecified: Secondary | ICD-10-CM | POA: Diagnosis not present

## 2021-02-28 DIAGNOSIS — D631 Anemia in chronic kidney disease: Secondary | ICD-10-CM | POA: Diagnosis not present

## 2021-02-28 DIAGNOSIS — N2581 Secondary hyperparathyroidism of renal origin: Secondary | ICD-10-CM | POA: Diagnosis not present

## 2021-02-28 DIAGNOSIS — Z992 Dependence on renal dialysis: Secondary | ICD-10-CM | POA: Diagnosis not present

## 2021-02-28 DIAGNOSIS — N186 End stage renal disease: Secondary | ICD-10-CM | POA: Diagnosis not present

## 2021-03-02 DIAGNOSIS — D631 Anemia in chronic kidney disease: Secondary | ICD-10-CM | POA: Diagnosis not present

## 2021-03-02 DIAGNOSIS — Z992 Dependence on renal dialysis: Secondary | ICD-10-CM | POA: Diagnosis not present

## 2021-03-02 DIAGNOSIS — D509 Iron deficiency anemia, unspecified: Secondary | ICD-10-CM | POA: Diagnosis not present

## 2021-03-02 DIAGNOSIS — N2581 Secondary hyperparathyroidism of renal origin: Secondary | ICD-10-CM | POA: Diagnosis not present

## 2021-03-02 DIAGNOSIS — N186 End stage renal disease: Secondary | ICD-10-CM | POA: Diagnosis not present

## 2021-03-04 DIAGNOSIS — N2581 Secondary hyperparathyroidism of renal origin: Secondary | ICD-10-CM | POA: Diagnosis not present

## 2021-03-04 DIAGNOSIS — Z992 Dependence on renal dialysis: Secondary | ICD-10-CM | POA: Diagnosis not present

## 2021-03-04 DIAGNOSIS — D509 Iron deficiency anemia, unspecified: Secondary | ICD-10-CM | POA: Diagnosis not present

## 2021-03-04 DIAGNOSIS — N186 End stage renal disease: Secondary | ICD-10-CM | POA: Diagnosis not present

## 2021-03-04 DIAGNOSIS — D631 Anemia in chronic kidney disease: Secondary | ICD-10-CM | POA: Diagnosis not present

## 2021-03-07 DIAGNOSIS — D509 Iron deficiency anemia, unspecified: Secondary | ICD-10-CM | POA: Diagnosis not present

## 2021-03-07 DIAGNOSIS — D631 Anemia in chronic kidney disease: Secondary | ICD-10-CM | POA: Diagnosis not present

## 2021-03-07 DIAGNOSIS — N2581 Secondary hyperparathyroidism of renal origin: Secondary | ICD-10-CM | POA: Diagnosis not present

## 2021-03-07 DIAGNOSIS — N186 End stage renal disease: Secondary | ICD-10-CM | POA: Diagnosis not present

## 2021-03-07 DIAGNOSIS — Z992 Dependence on renal dialysis: Secondary | ICD-10-CM | POA: Diagnosis not present

## 2021-03-09 DIAGNOSIS — Z992 Dependence on renal dialysis: Secondary | ICD-10-CM | POA: Diagnosis not present

## 2021-03-09 DIAGNOSIS — D509 Iron deficiency anemia, unspecified: Secondary | ICD-10-CM | POA: Diagnosis not present

## 2021-03-09 DIAGNOSIS — N2581 Secondary hyperparathyroidism of renal origin: Secondary | ICD-10-CM | POA: Diagnosis not present

## 2021-03-09 DIAGNOSIS — N186 End stage renal disease: Secondary | ICD-10-CM | POA: Diagnosis not present

## 2021-03-09 DIAGNOSIS — D631 Anemia in chronic kidney disease: Secondary | ICD-10-CM | POA: Diagnosis not present

## 2021-03-11 DIAGNOSIS — N186 End stage renal disease: Secondary | ICD-10-CM | POA: Diagnosis not present

## 2021-03-11 DIAGNOSIS — D509 Iron deficiency anemia, unspecified: Secondary | ICD-10-CM | POA: Diagnosis not present

## 2021-03-11 DIAGNOSIS — D631 Anemia in chronic kidney disease: Secondary | ICD-10-CM | POA: Diagnosis not present

## 2021-03-11 DIAGNOSIS — Z992 Dependence on renal dialysis: Secondary | ICD-10-CM | POA: Diagnosis not present

## 2021-03-11 DIAGNOSIS — N2581 Secondary hyperparathyroidism of renal origin: Secondary | ICD-10-CM | POA: Diagnosis not present

## 2021-03-14 DIAGNOSIS — N2581 Secondary hyperparathyroidism of renal origin: Secondary | ICD-10-CM | POA: Diagnosis not present

## 2021-03-14 DIAGNOSIS — Z992 Dependence on renal dialysis: Secondary | ICD-10-CM | POA: Diagnosis not present

## 2021-03-14 DIAGNOSIS — D509 Iron deficiency anemia, unspecified: Secondary | ICD-10-CM | POA: Diagnosis not present

## 2021-03-14 DIAGNOSIS — D631 Anemia in chronic kidney disease: Secondary | ICD-10-CM | POA: Diagnosis not present

## 2021-03-14 DIAGNOSIS — N186 End stage renal disease: Secondary | ICD-10-CM | POA: Diagnosis not present

## 2021-03-16 DIAGNOSIS — N2581 Secondary hyperparathyroidism of renal origin: Secondary | ICD-10-CM | POA: Diagnosis not present

## 2021-03-16 DIAGNOSIS — N186 End stage renal disease: Secondary | ICD-10-CM | POA: Diagnosis not present

## 2021-03-16 DIAGNOSIS — D509 Iron deficiency anemia, unspecified: Secondary | ICD-10-CM | POA: Diagnosis not present

## 2021-03-16 DIAGNOSIS — D631 Anemia in chronic kidney disease: Secondary | ICD-10-CM | POA: Diagnosis not present

## 2021-03-16 DIAGNOSIS — Z992 Dependence on renal dialysis: Secondary | ICD-10-CM | POA: Diagnosis not present

## 2021-03-18 DIAGNOSIS — N186 End stage renal disease: Secondary | ICD-10-CM | POA: Diagnosis not present

## 2021-03-18 DIAGNOSIS — Z992 Dependence on renal dialysis: Secondary | ICD-10-CM | POA: Diagnosis not present

## 2021-03-18 DIAGNOSIS — D631 Anemia in chronic kidney disease: Secondary | ICD-10-CM | POA: Diagnosis not present

## 2021-03-18 DIAGNOSIS — N2581 Secondary hyperparathyroidism of renal origin: Secondary | ICD-10-CM | POA: Diagnosis not present

## 2021-03-18 DIAGNOSIS — D509 Iron deficiency anemia, unspecified: Secondary | ICD-10-CM | POA: Diagnosis not present

## 2021-03-21 DIAGNOSIS — N186 End stage renal disease: Secondary | ICD-10-CM | POA: Diagnosis not present

## 2021-03-21 DIAGNOSIS — I129 Hypertensive chronic kidney disease with stage 1 through stage 4 chronic kidney disease, or unspecified chronic kidney disease: Secondary | ICD-10-CM | POA: Diagnosis not present

## 2021-03-21 DIAGNOSIS — D509 Iron deficiency anemia, unspecified: Secondary | ICD-10-CM | POA: Diagnosis not present

## 2021-03-21 DIAGNOSIS — Z992 Dependence on renal dialysis: Secondary | ICD-10-CM | POA: Diagnosis not present

## 2021-03-21 DIAGNOSIS — D631 Anemia in chronic kidney disease: Secondary | ICD-10-CM | POA: Diagnosis not present

## 2021-03-21 DIAGNOSIS — N2581 Secondary hyperparathyroidism of renal origin: Secondary | ICD-10-CM | POA: Diagnosis not present

## 2021-03-23 DIAGNOSIS — D631 Anemia in chronic kidney disease: Secondary | ICD-10-CM | POA: Diagnosis not present

## 2021-03-23 DIAGNOSIS — N2581 Secondary hyperparathyroidism of renal origin: Secondary | ICD-10-CM | POA: Diagnosis not present

## 2021-03-23 DIAGNOSIS — N186 End stage renal disease: Secondary | ICD-10-CM | POA: Diagnosis not present

## 2021-03-23 DIAGNOSIS — D509 Iron deficiency anemia, unspecified: Secondary | ICD-10-CM | POA: Diagnosis not present

## 2021-03-23 DIAGNOSIS — Z992 Dependence on renal dialysis: Secondary | ICD-10-CM | POA: Diagnosis not present

## 2021-03-25 DIAGNOSIS — Z992 Dependence on renal dialysis: Secondary | ICD-10-CM | POA: Diagnosis not present

## 2021-03-25 DIAGNOSIS — D509 Iron deficiency anemia, unspecified: Secondary | ICD-10-CM | POA: Diagnosis not present

## 2021-03-25 DIAGNOSIS — N186 End stage renal disease: Secondary | ICD-10-CM | POA: Diagnosis not present

## 2021-03-25 DIAGNOSIS — N2581 Secondary hyperparathyroidism of renal origin: Secondary | ICD-10-CM | POA: Diagnosis not present

## 2021-03-25 DIAGNOSIS — D631 Anemia in chronic kidney disease: Secondary | ICD-10-CM | POA: Diagnosis not present

## 2021-03-28 DIAGNOSIS — N186 End stage renal disease: Secondary | ICD-10-CM | POA: Diagnosis not present

## 2021-03-28 DIAGNOSIS — Z992 Dependence on renal dialysis: Secondary | ICD-10-CM | POA: Diagnosis not present

## 2021-03-28 DIAGNOSIS — D631 Anemia in chronic kidney disease: Secondary | ICD-10-CM | POA: Diagnosis not present

## 2021-03-28 DIAGNOSIS — D509 Iron deficiency anemia, unspecified: Secondary | ICD-10-CM | POA: Diagnosis not present

## 2021-03-28 DIAGNOSIS — N2581 Secondary hyperparathyroidism of renal origin: Secondary | ICD-10-CM | POA: Diagnosis not present

## 2021-03-30 DIAGNOSIS — D631 Anemia in chronic kidney disease: Secondary | ICD-10-CM | POA: Diagnosis not present

## 2021-03-30 DIAGNOSIS — Z992 Dependence on renal dialysis: Secondary | ICD-10-CM | POA: Diagnosis not present

## 2021-03-30 DIAGNOSIS — D509 Iron deficiency anemia, unspecified: Secondary | ICD-10-CM | POA: Diagnosis not present

## 2021-03-30 DIAGNOSIS — N2581 Secondary hyperparathyroidism of renal origin: Secondary | ICD-10-CM | POA: Diagnosis not present

## 2021-03-30 DIAGNOSIS — N186 End stage renal disease: Secondary | ICD-10-CM | POA: Diagnosis not present

## 2021-04-01 DIAGNOSIS — D509 Iron deficiency anemia, unspecified: Secondary | ICD-10-CM | POA: Diagnosis not present

## 2021-04-01 DIAGNOSIS — N2581 Secondary hyperparathyroidism of renal origin: Secondary | ICD-10-CM | POA: Diagnosis not present

## 2021-04-01 DIAGNOSIS — Z992 Dependence on renal dialysis: Secondary | ICD-10-CM | POA: Diagnosis not present

## 2021-04-01 DIAGNOSIS — D631 Anemia in chronic kidney disease: Secondary | ICD-10-CM | POA: Diagnosis not present

## 2021-04-01 DIAGNOSIS — N186 End stage renal disease: Secondary | ICD-10-CM | POA: Diagnosis not present

## 2021-04-03 ENCOUNTER — Ambulatory Visit: Payer: Medicare Other | Attending: Internal Medicine | Admitting: Internal Medicine

## 2021-04-03 ENCOUNTER — Other Ambulatory Visit: Payer: Self-pay

## 2021-04-03 ENCOUNTER — Encounter: Payer: Self-pay | Admitting: Internal Medicine

## 2021-04-03 VITALS — BP 101/56 | HR 68 | Resp 16 | Wt 224.4 lb

## 2021-04-03 DIAGNOSIS — E669 Obesity, unspecified: Secondary | ICD-10-CM

## 2021-04-03 DIAGNOSIS — Z992 Dependence on renal dialysis: Secondary | ICD-10-CM

## 2021-04-03 DIAGNOSIS — N186 End stage renal disease: Secondary | ICD-10-CM | POA: Diagnosis not present

## 2021-04-03 DIAGNOSIS — I10 Essential (primary) hypertension: Secondary | ICD-10-CM | POA: Diagnosis not present

## 2021-04-03 DIAGNOSIS — I5042 Chronic combined systolic (congestive) and diastolic (congestive) heart failure: Secondary | ICD-10-CM

## 2021-04-03 DIAGNOSIS — I25118 Atherosclerotic heart disease of native coronary artery with other forms of angina pectoris: Secondary | ICD-10-CM | POA: Diagnosis not present

## 2021-04-03 NOTE — Patient Instructions (Signed)
Patient advised to eliminate sugary drinks from the diet, cut back on portion sizes especially of white carbohydrates, eat more white lean meat like chicken [place] and seafood instead of beef or pork and incorporate fresh fruits and vegetables into the diet daily.

## 2021-04-03 NOTE — Progress Notes (Signed)
Patient ID: Seth Keller, male    DOB: Jul 10, 1965  MRN: [id number]  CC: Hypertension   Subjective: Seth Keller is a 56 y.o. male who presents for chronic disease management. His concerns today include:  Pt with hx of CAD s/p CABG x 4 (Dr. 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 Justin Mend), HTN, HL, ICM but last EF 08/2016 improved to 50-55%, 2DD,  obesity.   CKD/ACD: now on transplant list at [facility] per pt. going to dialysis 3 days a week Tuesday/Thursday/Saturday. Reports he still gets iron infusion in HD when needed  History of colon polyps: Referred to gastroenterology on last visit.  Reportedly has appt next mth with [facility]  HYPERTENSION/CAD Currently taking: see medication list -reports compliance with amlodipine, aspirin, atorvastatin, furosemide, isosorbide and metoprolol. Med Adherence: [x]  Yes    []  No Medication side effects: []  Yes    [x]  No Adherence with salt restriction: [x]  Yes    []  No Home Monitoring?: [x]  Yes    []  No Monitoring Frequency: checks BP at home 3 times a wk Home BP results range: usually less than 130/80 SOB? []  Yes    [x]  No Chest Pain?: [x]  Yes occasionally with exertion.  Last use SL Nitro 1 wk ago with good results   Leg swelling?: []  Yes    [x]  No Headaches?: []  Yes    [x]  No Dizziness? []  Yes    [x]  No Comments:   Obesity:  wgh fluctuates due to HD.  Down 14 lbs from last visit.   Drinks mainly water and juice.  Does not eat red meat.  Admits that he can do better with eating more fruits and vegetables.  HM:  declines shingrix.  Last Medicare wellness visit was 11/2020. Patient Active Problem List   Diagnosis Date Noted   ESRD on hemodialysis ([place]) 11/28/2020   Influenza vaccine refused 03/18/2020   Class 2 severe obesity due to excess calories with serious comorbidity and body mass index (BMI) of 38.0 to 38.9 in adult [facility]) 03/18/2020   Tachy-brady syndrome ([place]) 07/06/2019   Bradycardia 09/25/2018   Colon polyps 08/14/2018   Anemia  of chronic renal failure 06/04/2018   Secondary hyperparathyroidism of renal origin ([place]) 06/04/2018   Chronic bilateral low back pain without sciatica 12/22/2015   Obesity (BMI 30-39.9) 06/16/2015   SOB (shortness of breath) 09/27/2014   Left shoulder pain 09/08/2014   Coronary artery disease of bypass graft of native heart with stable angina pectoris ([place])    Ischemic cardiomyopathy    Chronic combined systolic and diastolic CHF (congestive heart failure) ([place])    Tobacco abuse, in remission    Constipation 01/11/2014   S/P CABG x 4 01/01/2014   Essential hypertension 05/08/2012   Microhematuria 05/08/2012   Proteinuria 05/08/2012     Current Outpatient Medications on File Prior to Visit  Medication Sig Dispense Refill   amLODipine (NORVASC) 10 MG tablet Take 1 tablet by mouth once daily 90 tablet 0   aspirin EC 81 MG tablet Take 1 tablet (81 mg total) by mouth daily. 90 tablet 3   atorvastatin (LIPITOR) 80 MG tablet Take 1 tablet (80 mg total) by mouth daily. 90 tablet 1   Blood Pressure Monitor DEVI Use as directed to check home blood pressure 2-3 times a week 1 Device 0   Cholecalciferol (VITAMIN D) 2000 units tablet Take 1 tablet (2,000 Units total) by mouth daily. 90 tablet 3   furosemide (LASIX) 40 MG tablet  Take 1 tablet (40 mg total) by mouth daily. 90 tablet 1   isosorbide mononitrate (IMDUR) 60 MG 24 hr tablet Take 1 tablet (60 mg total) by mouth daily. 90 tablet 3   metoprolol tartrate (LOPRESSOR) 25 MG tablet Take 1 tablet (25 mg total) by mouth 2 (two) times daily. (Patient taking differently: Take 50 mg by mouth daily.) 180 tablet 3   nitroGLYCERIN (NITROSTAT) 0.4 MG SL tablet Place 1 tablet (0.4 mg total) under the tongue every 5 (five) minutes as needed for chest pain. 90 tablet 3   OVER THE COUNTER MEDICATION Take 1 tablet by mouth daily. Total Beets otc supplement     No current facility-administered medications on file prior to visit.    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   From [place], originally.   Moved to [place] in 2012.    Social Determinants of Health   Financial Resource Strain: Not on file  Food Insecurity: Not on file  Transportation Needs: Not on file  Physical Activity: Not on file  Stress: Not on file  Social Connections: Not on file  Intimate Partner Violence: Not on file    Family History  Problem Relation Age of Onset   Heart disease Mother 40   Hypertension Father 22   Asthma Father    Diabetes Paternal Grandfather    Cancer Neg Hx     Past Surgical History:  Procedure Laterality Date   AV FISTULA PLACEMENT Right 08/15/2020   Procedure: RIGHT ARM RADIOCEPHALIC ARTERIOVENOUS (AV) FISTULA CREATION;  Surgeon: Cherre Robins, MD;  Location: [place];  Service: Vascular;  Laterality: Right;   CARDIAC CATHETERIZATION     CORONARY ARTERY BYPASS GRAFT N/A 01/01/2014   Procedure: CORONARY ARTERY BYPASS GRAFTING (CABG) times four using left internal mammary artery and right saphenous vein;  Surgeon: Gaye Pollack, MD;  Location: [facility];  Service: Open Heart Surgery;  Laterality: N/A;   INTRAOPERATIVE TRANSESOPHAGEAL ECHOCARDIOGRAM N/A 01/01/2014   Procedure: INTRAOPERATIVE TRANSESOPHAGEAL ECHOCARDIOGRAM;  Surgeon: Gaye Pollack, MD;  Location: [facility] OR;  Service: Open Heart Surgery;  Laterality: N/A;   LEFT HEART CATHETERIZATION WITH CORONARY ANGIOGRAM N/A 12/29/2013   Procedure: LEFT HEART CATHETERIZATION WITH CORONARY ANGIOGRAM;  Surgeon: Sinclair Grooms, MD;  Location: [facility] CATH [facility];  Service: Cardiovascular;  Laterality: N/A;   none  ROS: Review of Systems Negative except as stated above  PHYSICAL EXAM: BP (!) 101/56    Pulse 68    Resp 16    Wt 224 lb 6.4 oz (101.8 kg)    SpO2 98%    BMI 33.14 kg/m   Wt Readings from Last 3 Encounters:  04/03/21 224 lb 6.4 oz (101.8 kg)  12/19/20 238 lb 9.6 oz (108.2 kg)  11/28/20 241 lb 12.8 oz (109.7 kg)    Physical Exam  General appearance - alert, well appearing, middle age AAM and in no distress Mental status - normal mood, behavior, speech, dress, motor activity, and thought processes Chest - clear to auscultation, no wheezes, rales or rhonchi, symmetric air entry Heart - normal rate, regular rhythm, normal S1, S2, no murmurs, rubs, clicks or gallops Extremities - peripheral pulses normal, no pedal edema, no clubbing or cyanosis  CMP Latest Ref Rng & Units 03/18/2020 07/06/2019 10/02/2018  Glucose 65 - 99 mg/dL - 103(H) 131(H)  BUN 6 - 24 mg/dL - 26(H) 17  Creatinine 0.76 - 1.27 mg/dL - 2.74(H) 2.14(H)  Sodium 134 - 144 mmol/L - 142 142  Potassium 3.5 - 5.2 mmol/L 4.8 4.5 4.1  Chloride 96 - 106 mmol/L - 106 105  CO2 20 - 29 mmol/L - 21 23  Calcium 8.7 - 10.2 mg/dL - 9.2 8.8  Total Protein 6.0 - 8.5 g/dL 6.9 - 6.7  Total Bilirubin 0.0 - 1.2 mg/dL 0.2 - 0.3  Alkaline Phos 44 - 121 IU/L 108 - 92  AST 0 - 40 IU/L 14 - 14  ALT 0 - 44 IU/L 24 - 14   Lipid Panel     Component Value Date/Time   CHOL 160 03/18/2020 1019   TRIG 136  03/18/2020 1019   HDL 29 (L) 03/18/2020 1019   CHOLHDL 5.5 (H) 03/18/2020 1019   CHOLHDL 4.9 04/14/2015 0943   VLDL 22 04/14/2015 0943   LDLCALC 106 (H) 03/18/2020 1019   LDLDIRECT 198 (H) 05/29/2012 1744    CBC    Component Value Date/Time   WBC 8.8 03/18/2020 1019   WBC 8.0 08/16/2014 1147   RBC 4.60 03/18/2020 1019   RBC 4.97 08/16/2014 1147   HGB 12.6 (L) 03/18/2020 1019   HCT 38.5 03/18/2020 1019   PLT 289 03/18/2020 1019   MCV 84 03/18/2020 1019   MCH 27.4 03/18/2020 1019   MCH 27.6 08/16/2014 1147   MCHC 32.7 03/18/2020 1019   MCHC 33.5 08/16/2014 1147   RDW 14.5 03/18/2020 1019   LYMPHSABS 3.5 12/31/2013 0315   MONOABS 1.1 (H) 12/31/2013 0315   EOSABS 0.3 12/31/2013 0315   BASOSABS 0.0 12/31/2013 0315    ASSESSMENT AND PLAN: 1. Essential hypertension Blood pressure at goal.  Continue current medications including isosorbide, metoprolol, amlodipine  2. ESRD on hemodialysis [facility]) Patient will continue going to dialysis.  He is now on the transplant list at [facility].  3. Coronary artery disease of native artery of native heart with stable angina pectoris ([place]) Stable.  Continue atorvastatin, aspirin, beta-blocker and isosorbide  4. Chronic combined systolic and diastolic CHF (congestive heart failure) (H[facility]) Stable and compensated.  5. Obesity (BMI 30.0-34.9) Patient advised to eliminate sugary drinks from the diet, cut back on portion sizes especially of white carbohydrates, eat more white lean meat like chicken [place] and seafood instead of beef or pork and incorporate fresh fruits and vegetables into the diet daily.      Patient was given the opportunity to  ask questions.  Patient verbalized understanding of the plan and was able to repeat key elements of the plan.   No orders of the defined types were placed in this encounter.    Requested Prescriptions    No prescriptions requested or ordered in this encounter    Return in about 4 months  (around 08/01/2021).  Karle Plumber, MD, FACP

## 2021-04-04 DIAGNOSIS — N2581 Secondary hyperparathyroidism of renal origin: Secondary | ICD-10-CM | POA: Diagnosis not present

## 2021-04-04 DIAGNOSIS — D509 Iron deficiency anemia, unspecified: Secondary | ICD-10-CM | POA: Diagnosis not present

## 2021-04-04 DIAGNOSIS — Z992 Dependence on renal dialysis: Secondary | ICD-10-CM | POA: Diagnosis not present

## 2021-04-04 DIAGNOSIS — D631 Anemia in chronic kidney disease: Secondary | ICD-10-CM | POA: Diagnosis not present

## 2021-04-04 DIAGNOSIS — N186 End stage renal disease: Secondary | ICD-10-CM | POA: Diagnosis not present

## 2021-04-06 DIAGNOSIS — D631 Anemia in chronic kidney disease: Secondary | ICD-10-CM | POA: Diagnosis not present

## 2021-04-06 DIAGNOSIS — Z992 Dependence on renal dialysis: Secondary | ICD-10-CM | POA: Diagnosis not present

## 2021-04-06 DIAGNOSIS — N2581 Secondary hyperparathyroidism of renal origin: Secondary | ICD-10-CM | POA: Diagnosis not present

## 2021-04-06 DIAGNOSIS — D509 Iron deficiency anemia, unspecified: Secondary | ICD-10-CM | POA: Diagnosis not present

## 2021-04-06 DIAGNOSIS — N186 End stage renal disease: Secondary | ICD-10-CM | POA: Diagnosis not present

## 2021-04-08 DIAGNOSIS — Z992 Dependence on renal dialysis: Secondary | ICD-10-CM | POA: Diagnosis not present

## 2021-04-08 DIAGNOSIS — N2581 Secondary hyperparathyroidism of renal origin: Secondary | ICD-10-CM | POA: Diagnosis not present

## 2021-04-08 DIAGNOSIS — D509 Iron deficiency anemia, unspecified: Secondary | ICD-10-CM | POA: Diagnosis not present

## 2021-04-08 DIAGNOSIS — N186 End stage renal disease: Secondary | ICD-10-CM | POA: Diagnosis not present

## 2021-04-08 DIAGNOSIS — D631 Anemia in chronic kidney disease: Secondary | ICD-10-CM | POA: Diagnosis not present

## 2021-04-11 DIAGNOSIS — D509 Iron deficiency anemia, unspecified: Secondary | ICD-10-CM | POA: Diagnosis not present

## 2021-04-11 DIAGNOSIS — D631 Anemia in chronic kidney disease: Secondary | ICD-10-CM | POA: Diagnosis not present

## 2021-04-11 DIAGNOSIS — Z992 Dependence on renal dialysis: Secondary | ICD-10-CM | POA: Diagnosis not present

## 2021-04-11 DIAGNOSIS — N186 End stage renal disease: Secondary | ICD-10-CM | POA: Diagnosis not present

## 2021-04-11 DIAGNOSIS — N2581 Secondary hyperparathyroidism of renal origin: Secondary | ICD-10-CM | POA: Diagnosis not present

## 2021-04-13 DIAGNOSIS — D631 Anemia in chronic kidney disease: Secondary | ICD-10-CM | POA: Diagnosis not present

## 2021-04-13 DIAGNOSIS — N2581 Secondary hyperparathyroidism of renal origin: Secondary | ICD-10-CM | POA: Diagnosis not present

## 2021-04-13 DIAGNOSIS — N186 End stage renal disease: Secondary | ICD-10-CM | POA: Diagnosis not present

## 2021-04-13 DIAGNOSIS — D509 Iron deficiency anemia, unspecified: Secondary | ICD-10-CM | POA: Diagnosis not present

## 2021-04-13 DIAGNOSIS — Z992 Dependence on renal dialysis: Secondary | ICD-10-CM | POA: Diagnosis not present

## 2021-04-15 DIAGNOSIS — Z992 Dependence on renal dialysis: Secondary | ICD-10-CM | POA: Diagnosis not present

## 2021-04-15 DIAGNOSIS — D509 Iron deficiency anemia, unspecified: Secondary | ICD-10-CM | POA: Diagnosis not present

## 2021-04-15 DIAGNOSIS — D631 Anemia in chronic kidney disease: Secondary | ICD-10-CM | POA: Diagnosis not present

## 2021-04-15 DIAGNOSIS — N186 End stage renal disease: Secondary | ICD-10-CM | POA: Diagnosis not present

## 2021-04-15 DIAGNOSIS — N2581 Secondary hyperparathyroidism of renal origin: Secondary | ICD-10-CM | POA: Diagnosis not present

## 2021-04-18 DIAGNOSIS — D509 Iron deficiency anemia, unspecified: Secondary | ICD-10-CM | POA: Diagnosis not present

## 2021-04-18 DIAGNOSIS — D631 Anemia in chronic kidney disease: Secondary | ICD-10-CM | POA: Diagnosis not present

## 2021-04-18 DIAGNOSIS — I129 Hypertensive chronic kidney disease with stage 1 through stage 4 chronic kidney disease, or unspecified chronic kidney disease: Secondary | ICD-10-CM | POA: Diagnosis not present

## 2021-04-18 DIAGNOSIS — Z992 Dependence on renal dialysis: Secondary | ICD-10-CM | POA: Diagnosis not present

## 2021-04-18 DIAGNOSIS — N186 End stage renal disease: Secondary | ICD-10-CM | POA: Diagnosis not present

## 2021-04-18 DIAGNOSIS — N2581 Secondary hyperparathyroidism of renal origin: Secondary | ICD-10-CM | POA: Diagnosis not present

## 2021-04-20 DIAGNOSIS — D509 Iron deficiency anemia, unspecified: Secondary | ICD-10-CM | POA: Diagnosis not present

## 2021-04-20 DIAGNOSIS — Z992 Dependence on renal dialysis: Secondary | ICD-10-CM | POA: Diagnosis not present

## 2021-04-20 DIAGNOSIS — N2581 Secondary hyperparathyroidism of renal origin: Secondary | ICD-10-CM | POA: Diagnosis not present

## 2021-04-20 DIAGNOSIS — D631 Anemia in chronic kidney disease: Secondary | ICD-10-CM | POA: Diagnosis not present

## 2021-04-20 DIAGNOSIS — N186 End stage renal disease: Secondary | ICD-10-CM | POA: Diagnosis not present

## 2021-04-22 DIAGNOSIS — Z992 Dependence on renal dialysis: Secondary | ICD-10-CM | POA: Diagnosis not present

## 2021-04-22 DIAGNOSIS — D631 Anemia in chronic kidney disease: Secondary | ICD-10-CM | POA: Diagnosis not present

## 2021-04-22 DIAGNOSIS — N2581 Secondary hyperparathyroidism of renal origin: Secondary | ICD-10-CM | POA: Diagnosis not present

## 2021-04-22 DIAGNOSIS — N186 End stage renal disease: Secondary | ICD-10-CM | POA: Diagnosis not present

## 2021-04-22 DIAGNOSIS — D509 Iron deficiency anemia, unspecified: Secondary | ICD-10-CM | POA: Diagnosis not present

## 2021-04-23 ENCOUNTER — Other Ambulatory Visit: Payer: Self-pay | Admitting: Nurse Practitioner

## 2021-04-23 DIAGNOSIS — I5042 Chronic combined systolic (congestive) and diastolic (congestive) heart failure: Secondary | ICD-10-CM

## 2021-04-24 ENCOUNTER — Other Ambulatory Visit: Payer: Self-pay | Admitting: Gastroenterology

## 2021-04-25 DIAGNOSIS — Z992 Dependence on renal dialysis: Secondary | ICD-10-CM | POA: Diagnosis not present

## 2021-04-25 DIAGNOSIS — N2581 Secondary hyperparathyroidism of renal origin: Secondary | ICD-10-CM | POA: Diagnosis not present

## 2021-04-25 DIAGNOSIS — D509 Iron deficiency anemia, unspecified: Secondary | ICD-10-CM | POA: Diagnosis not present

## 2021-04-25 DIAGNOSIS — N186 End stage renal disease: Secondary | ICD-10-CM | POA: Diagnosis not present

## 2021-04-25 DIAGNOSIS — D631 Anemia in chronic kidney disease: Secondary | ICD-10-CM | POA: Diagnosis not present

## 2021-04-27 DIAGNOSIS — Z992 Dependence on renal dialysis: Secondary | ICD-10-CM | POA: Diagnosis not present

## 2021-04-27 DIAGNOSIS — N2581 Secondary hyperparathyroidism of renal origin: Secondary | ICD-10-CM | POA: Diagnosis not present

## 2021-04-27 DIAGNOSIS — D631 Anemia in chronic kidney disease: Secondary | ICD-10-CM | POA: Diagnosis not present

## 2021-04-27 DIAGNOSIS — D509 Iron deficiency anemia, unspecified: Secondary | ICD-10-CM | POA: Diagnosis not present

## 2021-04-27 DIAGNOSIS — N186 End stage renal disease: Secondary | ICD-10-CM | POA: Diagnosis not present

## 2021-04-29 DIAGNOSIS — N2581 Secondary hyperparathyroidism of renal origin: Secondary | ICD-10-CM | POA: Diagnosis not present

## 2021-04-29 DIAGNOSIS — D631 Anemia in chronic kidney disease: Secondary | ICD-10-CM | POA: Diagnosis not present

## 2021-04-29 DIAGNOSIS — Z992 Dependence on renal dialysis: Secondary | ICD-10-CM | POA: Diagnosis not present

## 2021-04-29 DIAGNOSIS — D509 Iron deficiency anemia, unspecified: Secondary | ICD-10-CM | POA: Diagnosis not present

## 2021-04-29 DIAGNOSIS — N186 End stage renal disease: Secondary | ICD-10-CM | POA: Diagnosis not present

## 2021-05-01 ENCOUNTER — Other Ambulatory Visit: Payer: Self-pay | Admitting: Nurse Practitioner

## 2021-05-02 DIAGNOSIS — N2581 Secondary hyperparathyroidism of renal origin: Secondary | ICD-10-CM | POA: Diagnosis not present

## 2021-05-02 DIAGNOSIS — N186 End stage renal disease: Secondary | ICD-10-CM | POA: Diagnosis not present

## 2021-05-02 DIAGNOSIS — Z992 Dependence on renal dialysis: Secondary | ICD-10-CM | POA: Diagnosis not present

## 2021-05-02 DIAGNOSIS — D631 Anemia in chronic kidney disease: Secondary | ICD-10-CM | POA: Diagnosis not present

## 2021-05-02 DIAGNOSIS — D509 Iron deficiency anemia, unspecified: Secondary | ICD-10-CM | POA: Diagnosis not present

## 2021-05-04 DIAGNOSIS — N2581 Secondary hyperparathyroidism of renal origin: Secondary | ICD-10-CM | POA: Diagnosis not present

## 2021-05-04 DIAGNOSIS — Z992 Dependence on renal dialysis: Secondary | ICD-10-CM | POA: Diagnosis not present

## 2021-05-04 DIAGNOSIS — N186 End stage renal disease: Secondary | ICD-10-CM | POA: Diagnosis not present

## 2021-05-04 DIAGNOSIS — D631 Anemia in chronic kidney disease: Secondary | ICD-10-CM | POA: Diagnosis not present

## 2021-05-04 DIAGNOSIS — D509 Iron deficiency anemia, unspecified: Secondary | ICD-10-CM | POA: Diagnosis not present

## 2021-05-06 DIAGNOSIS — N186 End stage renal disease: Secondary | ICD-10-CM | POA: Diagnosis not present

## 2021-05-06 DIAGNOSIS — Z992 Dependence on renal dialysis: Secondary | ICD-10-CM | POA: Diagnosis not present

## 2021-05-06 DIAGNOSIS — N2581 Secondary hyperparathyroidism of renal origin: Secondary | ICD-10-CM | POA: Diagnosis not present

## 2021-05-06 DIAGNOSIS — D631 Anemia in chronic kidney disease: Secondary | ICD-10-CM | POA: Diagnosis not present

## 2021-05-06 DIAGNOSIS — D509 Iron deficiency anemia, unspecified: Secondary | ICD-10-CM | POA: Diagnosis not present

## 2021-05-08 ENCOUNTER — Other Ambulatory Visit: Payer: Self-pay | Admitting: Internal Medicine

## 2021-05-08 DIAGNOSIS — I5042 Chronic combined systolic (congestive) and diastolic (congestive) heart failure: Secondary | ICD-10-CM

## 2021-05-08 DIAGNOSIS — I10 Essential (primary) hypertension: Secondary | ICD-10-CM

## 2021-05-08 DIAGNOSIS — I2510 Atherosclerotic heart disease of native coronary artery without angina pectoris: Secondary | ICD-10-CM

## 2021-05-08 NOTE — Telephone Encounter (Signed)
Medication Refill - Medication:amLODipine (NORVASC) 10 MG tablet furosemide (LASIX) 40 MG tablet metoprolol tartrate (LOPRESSOR) 25 MG tablet atorvastatin (LIPITOR) 80 MG tablet ? ?Has the patient contacted their pharmacy? Yes.   ?(Agent: If no, request that the patient contact the pharmacy for the refill. If patient does not wish to contact the pharmacy document the reason why and proceed with request.) ?(Agent: If yes, when and what did the pharmacy advise?) ? ?Preferred Pharmacy (with phone number or street name):  ?[facility] ([address]), [place] Phone:  [phone]  ?Fax:  [phone]  ?  ? ?Has the patient been seen for an appointment in the last year OR does the patient have an upcoming appointment? Yes.   ? ?Agent: Please be advised that RX refills may take up to 3 business days. We ask that you follow-up with your pharmacy.  ?

## 2021-05-09 DIAGNOSIS — Z992 Dependence on renal dialysis: Secondary | ICD-10-CM | POA: Diagnosis not present

## 2021-05-09 DIAGNOSIS — D509 Iron deficiency anemia, unspecified: Secondary | ICD-10-CM | POA: Diagnosis not present

## 2021-05-09 DIAGNOSIS — N186 End stage renal disease: Secondary | ICD-10-CM | POA: Diagnosis not present

## 2021-05-09 DIAGNOSIS — N2581 Secondary hyperparathyroidism of renal origin: Secondary | ICD-10-CM | POA: Diagnosis not present

## 2021-05-09 DIAGNOSIS — D631 Anemia in chronic kidney disease: Secondary | ICD-10-CM | POA: Diagnosis not present

## 2021-05-09 MED ORDER — METOPROLOL TARTRATE 25 MG PO TABS: 25.0000 mg | ORAL_TABLET | Freq: Two times a day (BID) | ORAL | 1 refills | Status: DC

## 2021-05-09 MED ORDER — AMLODIPINE BESYLATE 10 MG PO TABS: ORAL_TABLET | ORAL | 1 refills | Status: DC

## 2021-05-09 NOTE — Telephone Encounter (Signed)
Requested medication (s) are due for refill today: Yes ? ?Requested medication (s) are on the active medication list: Yes ? ?Last refill:  10/27/20 ? ?Future visit scheduled: No ? ?Notes to clinic:  Unable to refill per protocol due to failed labs, no updated results.  ? ? ? ? ?Requested Prescriptions  ?Pending Prescriptions Disp Refills  ? furosemide (LASIX) 40 MG tablet 90 tablet 1  ?  Sig: Take 1 tablet (40 mg total) by mouth daily.  ?  ? Cardiovascular:  Diuretics - Loop Failed - 05/08/2021  1:56 PM  ?  ?  Failed - K in normal range and within 180 days  ?  Potassium  ?Date Value Ref Range Status  ?03/18/2020 4.8 3.5 - 5.2 mmol/L Final  ?  ?  ?  ?  Failed - Ca in normal range and within 180 days  ?  Calcium  ?Date Value Ref Range Status  ?07/06/2019 9.2 8.7 - 10.2 mg/dL Final  ? ?Calcium, Ion  ?Date Value Ref Range Status  ?01/02/2014 1.23 1.12 - 1.23 mmol/L Final  ?  ?  ?  ?  Failed - Na in normal range and within 180 days  ?  Sodium  ?Date Value Ref Range Status  ?07/06/2019 142 134 - 144 mmol/L Final  ?  ?  ?  ?  Failed - Cr in normal range and within 180 days  ?  Creat  ?Date Value Ref Range Status  ?06/16/2015 1.88 (H) 0.70 - 1.33 mg/dL Final  ? ?Creatinine, Ser  ?Date Value Ref Range Status  ?07/06/2019 2.74 (H) 0.76 - 1.27 mg/dL Final  ?  ?  ?  ?  Failed - Cl in normal range and within 180 days  ?  Chloride  ?Date Value Ref Range Status  ?07/06/2019 106 96 - 106 mmol/L Final  ?  ?  ?  ?  Failed - Mg Level in normal range and within 180 days  ?  Magnesium  ?Date Value Ref Range Status  ?01/02/2014 2.7 (H) 1.5 - 2.5 mg/dL Final  ?  ?  ?  ?  Passed - Last BP in normal range  ?  BP Readings from Last 1 Encounters:  ?04/03/21 (!) 101/56  ?  ?  ?  ?  Passed - Valid encounter within last 6 months  ?  Recent Outpatient Visits   ? ?      ? 1 month ago Essential hypertension  ? [place] Ladell Pier, MD  ? 5 months ago Essential hypertension  ? [place] Ladell Pier, MD  ? 6 months ago Essential hypertension  ? Primary Care at [facility], Kriste Basque, NP  ? 1 year ago Essential hypertension  ? [place] Ladell Pier, MD  ? 1 year ago Encounter for Medicare annual wellness exam  ? [place], Jarome Matin, RPH-CPP  ? ?  ?  ? ?  ?  ?  ? atorvastatin (LIPITOR) 80 MG tablet 90 tablet 1  ?  Sig: Take 1 tablet (80 mg total) by mouth daily.  ?  ? Cardiovascular:  Antilipid - Statins Failed - 05/08/2021  1:56 PM  ?  ?  Failed - Lipid Panel in normal range within the last 12 months  ?  Cholesterol, Total  ?Date Value Ref Range Status  ?03/18/2020 160 100 - 199 mg/dL  Final  ? ?LDL Chol Calc (NIH)  ?Date Value Ref Range Status  ?03/18/2020 106 (H) 0 - 99 mg/dL Final  ? ?Direct LDL  ?Date Value Ref Range Status  ?05/29/2012 198 (H) mg/dL Final  ?  Comment:  ?  ATP III Classification (LDL): ?      < 100        mg/dL         Optimal ?     100 - 129     mg/dL         Near or Above Optimal ?     130 - 159     mg/dL         Borderline High ?     160 - 189     mg/dL         High ?      > 190        mg/dL         Very High ?   ? ?HDL  ?Date Value Ref Range Status  ?03/18/2020 29 (L) >39 mg/dL Final  ? ?Triglycerides  ?Date Value Ref Range Status  ?03/18/2020 136 0 - 149 mg/dL Final  ? ?  ?  ?  Passed - Patient is not pregnant  ?  ?  Passed - Valid encounter within last 12 months  ?  Recent Outpatient Visits   ? ?      ? 1 month ago Essential hypertension  ? [place] Ladell Pier, MD  ? 5 months ago Essential hypertension  ? [place] Ladell Pier, MD  ? 6 months ago Essential hypertension  ? [facility] Care at [facility], Kriste Basque, NP  ? 1 year ago Essential hypertension  ? [place] Ladell Pier, MD  ? 1 year ago Encounter for Medicare annual wellness exam  ? [place], RPH-CPP  ? ?  ?  ? ?  ?  ?  ?Signed Prescriptions Disp Refills  ? amLODipine (NORVASC) 10 MG tablet 90 tablet 1  ?  Sig: Take 1 tablet by mouth once daily  ?  ? Cardiovascular: Calcium Channel Blockers 2 Passed - 05/08/2021  1:56 PM  ?  ?  Passed - Last BP in normal range  ?  BP Readings from Last 1 Encounters:  ?04/03/21 (!) 101/56  ?  ?  ?  ?  Passed - Last Heart Rate in normal range  ?  Pulse Readings from Last 1 Encounters:  ?04/03/21 68  ?  ?  ?  ?  Passed - Valid encounter within last 6 months  ?  Recent Outpatient Visits   ? ?      ? 1 month ago Essential hypertension  ? [place] Ladell Pier, MD  ? 5 months ago Essential hypertension  ? [place] Ladell Pier, MD  ? 6 months ago Essential hypertension  ? Primary Care at [facility], Kriste Basque, NP  ? 1 year ago Essential hypertension  ? [place] Ladell Pier, MD  ? 1 year ago Encounter for Medicare annual wellness exam  ? [place], RPH-CPP  ? ?  ?  ? ?  ?  ?  ? metoprolol tartrate (LOPRESSOR)  25 MG tablet 180 tablet 1  ?  Sig: Take 1 tablet (25 mg total) by mouth 2 (two) times daily.  ?  ? Cardiovascular:  Beta Blockers Passed - 05/08/2021  1:56 PM  ?  ?  Passed - Last BP in normal range  ?  BP Readings from Last 1 Encounters:  ?04/03/21 (!) 101/56  ?  ?  ?  ?  Passed - Last Heart Rate in normal range  ?  Pulse Readings from Last 1 Encounters:  ?04/03/21 68  ?  ?  ?  ?  Passed - Valid encounter within last 6 months  ?  Recent Outpatient Visits   ? ?      ? 1 month ago Essential hypertension  ? [place] Ladell Pier, MD  ? 5 months ago Essential hypertension  ? [place] Ladell Pier, MD  ? 6 months ago Essential hypertension  ? Primary Care at  [facility], Kriste Basque, NP  ? 1 year ago Essential hypertension  ? [place] Ladell Pier, MD  ? 1 year ago Encounter for Medicare annual wellness exam  ? [place], RPH-CPP  ? ?  ?  ? ?  ?  ?  ? ? ? ? ?

## 2021-05-09 NOTE — Telephone Encounter (Signed)
Requested Prescriptions  ?Pending Prescriptions Disp Refills  ?? amLODipine (NORVASC) 10 MG tablet 90 tablet 1  ?  Sig: Take 1 tablet by mouth once daily  ?  ? Cardiovascular: Calcium Channel Blockers 2 Passed - 05/08/2021  1:56 PM  ?  ?  Passed - Last BP in normal range  ?  BP Readings from Last 1 Encounters:  ?04/03/21 (!) 101/56  ?   ?  ?  Passed - Last Heart Rate in normal range  ?  Pulse Readings from Last 1 Encounters:  ?04/03/21 68  ?   ?  ?  Passed - Valid encounter within last 6 months  ?  Recent Outpatient Visits   ?      ? 1 month ago Essential hypertension  ? [place] Ladell Pier, MD  ? 5 months ago Essential hypertension  ? [place] Ladell Pier, MD  ? 6 months ago Essential hypertension  ? Primary Care at [facility], Kriste Basque, NP  ? 1 year ago Essential hypertension  ? [place] Ladell Pier, MD  ? 1 year ago Encounter for Medicare annual wellness exam  ? [place], RPH-CPP  ?  ?  ? ?  ?  ?  ?? furosemide (LASIX) 40 MG tablet 90 tablet 1  ?  Sig: Take 1 tablet (40 mg total) by mouth daily.  ?  ? Cardiovascular:  Diuretics - Loop Failed - 05/08/2021  1:56 PM  ?  ?  Failed - K in normal range and within 180 days  ?  Potassium  ?Date Value Ref Range Status  ?03/18/2020 4.8 3.5 - 5.2 mmol/L Final  ?   ?  ?  Failed - Ca in normal range and within 180 days  ?  Calcium  ?Date Value Ref Range Status  ?07/06/2019 9.2 8.7 - 10.2 mg/dL Final  ? ?Calcium, Ion  ?Date Value Ref Range Status  ?01/02/2014 1.23 1.12 - 1.23 mmol/L Final  ?   ?  ?  Failed - Na in normal range and within 180 days  ?  Sodium  ?Date Value Ref Range Status  ?07/06/2019 142 134 - 144 mmol/L Final  ?   ?  ?  Failed - Cr in normal range and within 180 days  ?  Creat  ?Date Value Ref Range Status  ?06/16/2015 1.88 (H) 0.70 - 1.33 mg/dL Final  ? ?Creatinine, Ser  ?Date  Value Ref Range Status  ?07/06/2019 2.74 (H) 0.76 - 1.27 mg/dL Final  ?   ?  ?  Failed - Cl in normal range and within 180 days  ?  Chloride  ?Date Value Ref Range Status  ?07/06/2019 106 96 - 106 mmol/L Final  ?   ?  ?  Failed - Mg Level in normal range and within 180 days  ?  Magnesium  ?Date Value Ref Range Status  ?01/02/2014 2.7 (H) 1.5 - 2.5 mg/dL Final  ?   ?  ?  Passed - Last BP in normal range  ?  BP Readings from Last 1 Encounters:  ?04/03/21 (!) 101/56  ?   ?  ?  Passed - Valid encounter within last 6 months  ?  Recent Outpatient Visits   ?      ? 1 month ago Essential hypertension  ? [place],  Dalbert Batman, MD  ? 5 months ago Essential hypertension  ? [place] Ladell Pier, MD  ? 6 months ago Essential hypertension  ? Primary Care at [facility], Kriste Basque, NP  ? 1 year ago Essential hypertension  ? [place] Ladell Pier, MD  ? 1 year ago Encounter for Medicare annual wellness exam  ? [place], Jarome Matin, RPH-CPP  ?  ?  ? ?  ?  ?  ?? metoprolol tartrate (LOPRESSOR) 25 MG tablet 180 tablet 1  ?  Sig: Take 1 tablet (25 mg total) by mouth 2 (two) times daily.  ?  ? Cardiovascular:  Beta Blockers Passed - 05/08/2021  1:56 PM  ?  ?  Passed - Last BP in normal range  ?  BP Readings from Last 1 Encounters:  ?04/03/21 (!) 101/56  ?   ?  ?  Passed - Last Heart Rate in normal range  ?  Pulse Readings from Last 1 Encounters:  ?04/03/21 68  ?   ?  ?  Passed - Valid encounter within last 6 months  ?  Recent Outpatient Visits   ?      ? 1 month ago Essential hypertension  ? [place] Ladell Pier, MD  ? 5 months ago Essential hypertension  ? [place] Ladell Pier, MD  ? 6 months ago Essential hypertension  ? Primary Care at [facility], Kriste Basque, NP  ? 1 year ago  Essential hypertension  ? [place] Ladell Pier, MD  ? 1 year ago Encounter for Medicare annual wellness exam  ? [place], Jarome Matin, RPH-CPP  ?  ?  ? ?  ?  ?  ?? atorvastatin (LIPITOR) 80 MG tablet 90 tablet 1  ?  Sig: Take 1 tablet (80 mg total) by mouth daily.  ?  ? Cardiovascular:  Antilipid - Statins Failed - 05/08/2021  1:56 PM  ?  ?  Failed - Lipid Panel in normal range within the last 12 months  ?  Cholesterol, Total  ?Date Value Ref Range Status  ?03/18/2020 160 100 - 199 mg/dL Final  ? ?LDL Chol Calc (NIH)  ?Date Value Ref Range Status  ?03/18/2020 106 (H) 0 - 99 mg/dL Final  ? ?Direct LDL  ?Date Value Ref Range Status  ?05/29/2012 198 (H) mg/dL Final  ?  Comment:  ?  ATP III Classification (LDL): ?      < 100        mg/dL         Optimal ?     100 - 129     mg/dL         Near or Above Optimal ?     130 - 159     mg/dL         Borderline High ?     160 - 189     mg/dL         High ?      > 190        mg/dL         Very High ?   ? ?HDL  ?Date Value Ref Range Status  ?03/18/2020 29 (L) >39 mg/dL Final  ? ?Triglycerides  ?Date Value Ref Range Status  ?03/18/2020  136 0 - 149 mg/dL Final  ? ?  ?  ?  Passed - Patient is not pregnant  ?  ?  Passed - Valid encounter within last 12 months  ?  Recent Outpatient Visits   ?      ? 1 month ago Essential hypertension  ? [place] Ladell Pier, MD  ? 5 months ago Essential hypertension  ? [place] Ladell Pier, MD  ? 6 months ago Essential hypertension  ? Primary Care at [facility], Kriste Basque, NP  ? 1 year ago Essential hypertension  ? [place] Ladell Pier, MD  ? 1 year ago Encounter for Medicare annual wellness exam  ? [place], RPH-CPP  ?  ?  ? ?  ?  ?  ? ?

## 2021-05-10 MED ORDER — ATORVASTATIN CALCIUM 80 MG PO TABS: 80.0000 mg | ORAL_TABLET | Freq: Every day | ORAL | 1 refills | Status: DC

## 2021-05-10 MED ORDER — FUROSEMIDE 40 MG PO TABS: 40.0000 mg | ORAL_TABLET | Freq: Every day | ORAL | 1 refills | Status: DC

## 2021-05-11 DIAGNOSIS — N186 End stage renal disease: Secondary | ICD-10-CM | POA: Diagnosis not present

## 2021-05-11 DIAGNOSIS — N2581 Secondary hyperparathyroidism of renal origin: Secondary | ICD-10-CM | POA: Diagnosis not present

## 2021-05-11 DIAGNOSIS — D509 Iron deficiency anemia, unspecified: Secondary | ICD-10-CM | POA: Diagnosis not present

## 2021-05-11 DIAGNOSIS — D631 Anemia in chronic kidney disease: Secondary | ICD-10-CM | POA: Diagnosis not present

## 2021-05-11 DIAGNOSIS — Z992 Dependence on renal dialysis: Secondary | ICD-10-CM | POA: Diagnosis not present

## 2021-05-13 DIAGNOSIS — N186 End stage renal disease: Secondary | ICD-10-CM | POA: Diagnosis not present

## 2021-05-13 DIAGNOSIS — D631 Anemia in chronic kidney disease: Secondary | ICD-10-CM | POA: Diagnosis not present

## 2021-05-13 DIAGNOSIS — Z992 Dependence on renal dialysis: Secondary | ICD-10-CM | POA: Diagnosis not present

## 2021-05-13 DIAGNOSIS — D509 Iron deficiency anemia, unspecified: Secondary | ICD-10-CM | POA: Diagnosis not present

## 2021-05-13 DIAGNOSIS — N2581 Secondary hyperparathyroidism of renal origin: Secondary | ICD-10-CM | POA: Diagnosis not present

## 2021-05-16 DIAGNOSIS — N2581 Secondary hyperparathyroidism of renal origin: Secondary | ICD-10-CM | POA: Diagnosis not present

## 2021-05-16 DIAGNOSIS — D509 Iron deficiency anemia, unspecified: Secondary | ICD-10-CM | POA: Diagnosis not present

## 2021-05-16 DIAGNOSIS — D631 Anemia in chronic kidney disease: Secondary | ICD-10-CM | POA: Diagnosis not present

## 2021-05-16 DIAGNOSIS — Z992 Dependence on renal dialysis: Secondary | ICD-10-CM | POA: Diagnosis not present

## 2021-05-16 DIAGNOSIS — N186 End stage renal disease: Secondary | ICD-10-CM | POA: Diagnosis not present

## 2021-05-18 DIAGNOSIS — D509 Iron deficiency anemia, unspecified: Secondary | ICD-10-CM | POA: Diagnosis not present

## 2021-05-18 DIAGNOSIS — N186 End stage renal disease: Secondary | ICD-10-CM | POA: Diagnosis not present

## 2021-05-18 DIAGNOSIS — D631 Anemia in chronic kidney disease: Secondary | ICD-10-CM | POA: Diagnosis not present

## 2021-05-18 DIAGNOSIS — Z992 Dependence on renal dialysis: Secondary | ICD-10-CM | POA: Diagnosis not present

## 2021-05-18 DIAGNOSIS — N2581 Secondary hyperparathyroidism of renal origin: Secondary | ICD-10-CM | POA: Diagnosis not present

## 2021-05-19 DIAGNOSIS — N186 End stage renal disease: Secondary | ICD-10-CM | POA: Diagnosis not present

## 2021-05-19 DIAGNOSIS — I129 Hypertensive chronic kidney disease with stage 1 through stage 4 chronic kidney disease, or unspecified chronic kidney disease: Secondary | ICD-10-CM | POA: Diagnosis not present

## 2021-05-19 DIAGNOSIS — Z992 Dependence on renal dialysis: Secondary | ICD-10-CM | POA: Diagnosis not present

## 2021-05-20 DIAGNOSIS — N2581 Secondary hyperparathyroidism of renal origin: Secondary | ICD-10-CM | POA: Diagnosis not present

## 2021-05-20 DIAGNOSIS — N186 End stage renal disease: Secondary | ICD-10-CM | POA: Diagnosis not present

## 2021-05-20 DIAGNOSIS — Z992 Dependence on renal dialysis: Secondary | ICD-10-CM | POA: Diagnosis not present

## 2021-05-23 DIAGNOSIS — N186 End stage renal disease: Secondary | ICD-10-CM | POA: Diagnosis not present

## 2021-05-23 DIAGNOSIS — N2581 Secondary hyperparathyroidism of renal origin: Secondary | ICD-10-CM | POA: Diagnosis not present

## 2021-05-23 DIAGNOSIS — Z992 Dependence on renal dialysis: Secondary | ICD-10-CM | POA: Diagnosis not present

## 2021-05-25 DIAGNOSIS — N2581 Secondary hyperparathyroidism of renal origin: Secondary | ICD-10-CM | POA: Diagnosis not present

## 2021-05-25 DIAGNOSIS — N186 End stage renal disease: Secondary | ICD-10-CM | POA: Diagnosis not present

## 2021-05-25 DIAGNOSIS — Z992 Dependence on renal dialysis: Secondary | ICD-10-CM | POA: Diagnosis not present

## 2021-05-27 DIAGNOSIS — N186 End stage renal disease: Secondary | ICD-10-CM | POA: Diagnosis not present

## 2021-05-27 DIAGNOSIS — N2581 Secondary hyperparathyroidism of renal origin: Secondary | ICD-10-CM | POA: Diagnosis not present

## 2021-05-27 DIAGNOSIS — Z992 Dependence on renal dialysis: Secondary | ICD-10-CM | POA: Diagnosis not present

## 2021-05-30 DIAGNOSIS — N2581 Secondary hyperparathyroidism of renal origin: Secondary | ICD-10-CM | POA: Diagnosis not present

## 2021-05-30 DIAGNOSIS — N186 End stage renal disease: Secondary | ICD-10-CM | POA: Diagnosis not present

## 2021-05-30 DIAGNOSIS — Z992 Dependence on renal dialysis: Secondary | ICD-10-CM | POA: Diagnosis not present

## 2021-06-01 DIAGNOSIS — N186 End stage renal disease: Secondary | ICD-10-CM | POA: Diagnosis not present

## 2021-06-01 DIAGNOSIS — N2581 Secondary hyperparathyroidism of renal origin: Secondary | ICD-10-CM | POA: Diagnosis not present

## 2021-06-01 DIAGNOSIS — Z992 Dependence on renal dialysis: Secondary | ICD-10-CM | POA: Diagnosis not present

## 2021-06-03 DIAGNOSIS — N186 End stage renal disease: Secondary | ICD-10-CM | POA: Diagnosis not present

## 2021-06-03 DIAGNOSIS — Z992 Dependence on renal dialysis: Secondary | ICD-10-CM | POA: Diagnosis not present

## 2021-06-03 DIAGNOSIS — N2581 Secondary hyperparathyroidism of renal origin: Secondary | ICD-10-CM | POA: Diagnosis not present

## 2021-06-06 DIAGNOSIS — N2581 Secondary hyperparathyroidism of renal origin: Secondary | ICD-10-CM | POA: Diagnosis not present

## 2021-06-06 DIAGNOSIS — Z992 Dependence on renal dialysis: Secondary | ICD-10-CM | POA: Diagnosis not present

## 2021-06-06 DIAGNOSIS — N186 End stage renal disease: Secondary | ICD-10-CM | POA: Diagnosis not present

## 2021-06-08 DIAGNOSIS — N186 End stage renal disease: Secondary | ICD-10-CM | POA: Diagnosis not present

## 2021-06-08 DIAGNOSIS — N2581 Secondary hyperparathyroidism of renal origin: Secondary | ICD-10-CM | POA: Diagnosis not present

## 2021-06-08 DIAGNOSIS — Z992 Dependence on renal dialysis: Secondary | ICD-10-CM | POA: Diagnosis not present

## 2021-06-10 DIAGNOSIS — N186 End stage renal disease: Secondary | ICD-10-CM | POA: Diagnosis not present

## 2021-06-10 DIAGNOSIS — Z992 Dependence on renal dialysis: Secondary | ICD-10-CM | POA: Diagnosis not present

## 2021-06-10 DIAGNOSIS — N2581 Secondary hyperparathyroidism of renal origin: Secondary | ICD-10-CM | POA: Diagnosis not present

## 2021-06-13 DIAGNOSIS — N2581 Secondary hyperparathyroidism of renal origin: Secondary | ICD-10-CM | POA: Diagnosis not present

## 2021-06-13 DIAGNOSIS — Z992 Dependence on renal dialysis: Secondary | ICD-10-CM | POA: Diagnosis not present

## 2021-06-13 DIAGNOSIS — N186 End stage renal disease: Secondary | ICD-10-CM | POA: Diagnosis not present

## 2021-06-15 DIAGNOSIS — N186 End stage renal disease: Secondary | ICD-10-CM | POA: Diagnosis not present

## 2021-06-15 DIAGNOSIS — N2581 Secondary hyperparathyroidism of renal origin: Secondary | ICD-10-CM | POA: Diagnosis not present

## 2021-06-15 DIAGNOSIS — Z992 Dependence on renal dialysis: Secondary | ICD-10-CM | POA: Diagnosis not present

## 2021-06-17 DIAGNOSIS — Z992 Dependence on renal dialysis: Secondary | ICD-10-CM | POA: Diagnosis not present

## 2021-06-17 DIAGNOSIS — N186 End stage renal disease: Secondary | ICD-10-CM | POA: Diagnosis not present

## 2021-06-17 DIAGNOSIS — N2581 Secondary hyperparathyroidism of renal origin: Secondary | ICD-10-CM | POA: Diagnosis not present

## 2021-06-18 DIAGNOSIS — I129 Hypertensive chronic kidney disease with stage 1 through stage 4 chronic kidney disease, or unspecified chronic kidney disease: Secondary | ICD-10-CM | POA: Diagnosis not present

## 2021-06-18 DIAGNOSIS — N186 End stage renal disease: Secondary | ICD-10-CM | POA: Diagnosis not present

## 2021-06-18 DIAGNOSIS — Z992 Dependence on renal dialysis: Secondary | ICD-10-CM | POA: Diagnosis not present

## 2021-06-19 DIAGNOSIS — N186 End stage renal disease: Secondary | ICD-10-CM | POA: Diagnosis not present

## 2021-06-19 DIAGNOSIS — N2581 Secondary hyperparathyroidism of renal origin: Secondary | ICD-10-CM | POA: Diagnosis not present

## 2021-06-19 DIAGNOSIS — Z992 Dependence on renal dialysis: Secondary | ICD-10-CM | POA: Diagnosis not present

## 2021-06-21 DIAGNOSIS — N2581 Secondary hyperparathyroidism of renal origin: Secondary | ICD-10-CM | POA: Diagnosis not present

## 2021-06-21 DIAGNOSIS — N186 End stage renal disease: Secondary | ICD-10-CM | POA: Diagnosis not present

## 2021-06-21 DIAGNOSIS — Z992 Dependence on renal dialysis: Secondary | ICD-10-CM | POA: Diagnosis not present

## 2021-06-23 DIAGNOSIS — N2581 Secondary hyperparathyroidism of renal origin: Secondary | ICD-10-CM | POA: Diagnosis not present

## 2021-06-23 DIAGNOSIS — Z992 Dependence on renal dialysis: Secondary | ICD-10-CM | POA: Diagnosis not present

## 2021-06-23 DIAGNOSIS — N186 End stage renal disease: Secondary | ICD-10-CM | POA: Diagnosis not present

## 2021-06-26 DIAGNOSIS — N186 End stage renal disease: Secondary | ICD-10-CM | POA: Diagnosis not present

## 2021-06-26 DIAGNOSIS — Z992 Dependence on renal dialysis: Secondary | ICD-10-CM | POA: Diagnosis not present

## 2021-06-26 DIAGNOSIS — N2581 Secondary hyperparathyroidism of renal origin: Secondary | ICD-10-CM | POA: Diagnosis not present

## 2021-06-28 DIAGNOSIS — Z992 Dependence on renal dialysis: Secondary | ICD-10-CM | POA: Diagnosis not present

## 2021-06-28 DIAGNOSIS — N186 End stage renal disease: Secondary | ICD-10-CM | POA: Diagnosis not present

## 2021-06-28 DIAGNOSIS — N2581 Secondary hyperparathyroidism of renal origin: Secondary | ICD-10-CM | POA: Diagnosis not present

## 2021-06-30 DIAGNOSIS — N186 End stage renal disease: Secondary | ICD-10-CM | POA: Diagnosis not present

## 2021-06-30 DIAGNOSIS — N2581 Secondary hyperparathyroidism of renal origin: Secondary | ICD-10-CM | POA: Diagnosis not present

## 2021-06-30 DIAGNOSIS — Z992 Dependence on renal dialysis: Secondary | ICD-10-CM | POA: Diagnosis not present

## 2021-07-03 DIAGNOSIS — N2581 Secondary hyperparathyroidism of renal origin: Secondary | ICD-10-CM | POA: Diagnosis not present

## 2021-07-03 DIAGNOSIS — N186 End stage renal disease: Secondary | ICD-10-CM | POA: Diagnosis not present

## 2021-07-03 DIAGNOSIS — Z992 Dependence on renal dialysis: Secondary | ICD-10-CM | POA: Diagnosis not present

## 2021-07-05 DIAGNOSIS — N186 End stage renal disease: Secondary | ICD-10-CM | POA: Diagnosis not present

## 2021-07-05 DIAGNOSIS — Z992 Dependence on renal dialysis: Secondary | ICD-10-CM | POA: Diagnosis not present

## 2021-07-05 DIAGNOSIS — N2581 Secondary hyperparathyroidism of renal origin: Secondary | ICD-10-CM | POA: Diagnosis not present

## 2021-07-07 DIAGNOSIS — Z992 Dependence on renal dialysis: Secondary | ICD-10-CM | POA: Diagnosis not present

## 2021-07-07 DIAGNOSIS — N186 End stage renal disease: Secondary | ICD-10-CM | POA: Diagnosis not present

## 2021-07-07 DIAGNOSIS — N2581 Secondary hyperparathyroidism of renal origin: Secondary | ICD-10-CM | POA: Diagnosis not present

## 2021-07-10 DIAGNOSIS — N186 End stage renal disease: Secondary | ICD-10-CM | POA: Diagnosis not present

## 2021-07-10 DIAGNOSIS — N2581 Secondary hyperparathyroidism of renal origin: Secondary | ICD-10-CM | POA: Diagnosis not present

## 2021-07-10 DIAGNOSIS — Z992 Dependence on renal dialysis: Secondary | ICD-10-CM | POA: Diagnosis not present

## 2021-07-12 DIAGNOSIS — Z992 Dependence on renal dialysis: Secondary | ICD-10-CM | POA: Diagnosis not present

## 2021-07-12 DIAGNOSIS — N2581 Secondary hyperparathyroidism of renal origin: Secondary | ICD-10-CM | POA: Diagnosis not present

## 2021-07-12 DIAGNOSIS — N186 End stage renal disease: Secondary | ICD-10-CM | POA: Diagnosis not present

## 2021-07-14 DIAGNOSIS — N186 End stage renal disease: Secondary | ICD-10-CM | POA: Diagnosis not present

## 2021-07-14 DIAGNOSIS — Z992 Dependence on renal dialysis: Secondary | ICD-10-CM | POA: Diagnosis not present

## 2021-07-14 DIAGNOSIS — N2581 Secondary hyperparathyroidism of renal origin: Secondary | ICD-10-CM | POA: Diagnosis not present

## 2021-07-17 DIAGNOSIS — N2581 Secondary hyperparathyroidism of renal origin: Secondary | ICD-10-CM | POA: Diagnosis not present

## 2021-07-17 DIAGNOSIS — N186 End stage renal disease: Secondary | ICD-10-CM | POA: Diagnosis not present

## 2021-07-17 DIAGNOSIS — Z992 Dependence on renal dialysis: Secondary | ICD-10-CM | POA: Diagnosis not present

## 2021-07-19 DIAGNOSIS — Z992 Dependence on renal dialysis: Secondary | ICD-10-CM | POA: Diagnosis not present

## 2021-07-19 DIAGNOSIS — I129 Hypertensive chronic kidney disease with stage 1 through stage 4 chronic kidney disease, or unspecified chronic kidney disease: Secondary | ICD-10-CM | POA: Diagnosis not present

## 2021-07-19 DIAGNOSIS — N186 End stage renal disease: Secondary | ICD-10-CM | POA: Diagnosis not present

## 2021-07-19 DIAGNOSIS — N2581 Secondary hyperparathyroidism of renal origin: Secondary | ICD-10-CM | POA: Diagnosis not present

## 2021-07-21 DIAGNOSIS — N186 End stage renal disease: Secondary | ICD-10-CM | POA: Diagnosis not present

## 2021-07-21 DIAGNOSIS — N2581 Secondary hyperparathyroidism of renal origin: Secondary | ICD-10-CM | POA: Diagnosis not present

## 2021-07-21 DIAGNOSIS — Z992 Dependence on renal dialysis: Secondary | ICD-10-CM | POA: Diagnosis not present

## 2021-07-24 DIAGNOSIS — N2581 Secondary hyperparathyroidism of renal origin: Secondary | ICD-10-CM | POA: Diagnosis not present

## 2021-07-24 DIAGNOSIS — Z992 Dependence on renal dialysis: Secondary | ICD-10-CM | POA: Diagnosis not present

## 2021-07-24 DIAGNOSIS — N186 End stage renal disease: Secondary | ICD-10-CM | POA: Diagnosis not present

## 2021-07-26 DIAGNOSIS — Z992 Dependence on renal dialysis: Secondary | ICD-10-CM | POA: Diagnosis not present

## 2021-07-26 DIAGNOSIS — N2581 Secondary hyperparathyroidism of renal origin: Secondary | ICD-10-CM | POA: Diagnosis not present

## 2021-07-26 DIAGNOSIS — N186 End stage renal disease: Secondary | ICD-10-CM | POA: Diagnosis not present

## 2021-07-28 DIAGNOSIS — N186 End stage renal disease: Secondary | ICD-10-CM | POA: Diagnosis not present

## 2021-07-28 DIAGNOSIS — N2581 Secondary hyperparathyroidism of renal origin: Secondary | ICD-10-CM | POA: Diagnosis not present

## 2021-07-28 DIAGNOSIS — Z992 Dependence on renal dialysis: Secondary | ICD-10-CM | POA: Diagnosis not present

## 2021-07-31 DIAGNOSIS — N2581 Secondary hyperparathyroidism of renal origin: Secondary | ICD-10-CM | POA: Diagnosis not present

## 2021-07-31 DIAGNOSIS — Z992 Dependence on renal dialysis: Secondary | ICD-10-CM | POA: Diagnosis not present

## 2021-07-31 DIAGNOSIS — N186 End stage renal disease: Secondary | ICD-10-CM | POA: Diagnosis not present

## 2021-08-02 DIAGNOSIS — N2581 Secondary hyperparathyroidism of renal origin: Secondary | ICD-10-CM | POA: Diagnosis not present

## 2021-08-02 DIAGNOSIS — N186 End stage renal disease: Secondary | ICD-10-CM | POA: Diagnosis not present

## 2021-08-02 DIAGNOSIS — Z992 Dependence on renal dialysis: Secondary | ICD-10-CM | POA: Diagnosis not present

## 2021-08-04 DIAGNOSIS — N2581 Secondary hyperparathyroidism of renal origin: Secondary | ICD-10-CM | POA: Diagnosis not present

## 2021-08-04 DIAGNOSIS — Z992 Dependence on renal dialysis: Secondary | ICD-10-CM | POA: Diagnosis not present

## 2021-08-04 DIAGNOSIS — N186 End stage renal disease: Secondary | ICD-10-CM | POA: Diagnosis not present

## 2021-08-07 DIAGNOSIS — N186 End stage renal disease: Secondary | ICD-10-CM | POA: Diagnosis not present

## 2021-08-07 DIAGNOSIS — N2581 Secondary hyperparathyroidism of renal origin: Secondary | ICD-10-CM | POA: Diagnosis not present

## 2021-08-07 DIAGNOSIS — Z992 Dependence on renal dialysis: Secondary | ICD-10-CM | POA: Diagnosis not present

## 2021-08-09 DIAGNOSIS — N2581 Secondary hyperparathyroidism of renal origin: Secondary | ICD-10-CM | POA: Diagnosis not present

## 2021-08-09 DIAGNOSIS — N186 End stage renal disease: Secondary | ICD-10-CM | POA: Diagnosis not present

## 2021-08-09 DIAGNOSIS — Z992 Dependence on renal dialysis: Secondary | ICD-10-CM | POA: Diagnosis not present

## 2021-08-11 DIAGNOSIS — N2581 Secondary hyperparathyroidism of renal origin: Secondary | ICD-10-CM | POA: Diagnosis not present

## 2021-08-11 DIAGNOSIS — N186 End stage renal disease: Secondary | ICD-10-CM | POA: Diagnosis not present

## 2021-08-11 DIAGNOSIS — Z992 Dependence on renal dialysis: Secondary | ICD-10-CM | POA: Diagnosis not present

## 2021-08-14 DIAGNOSIS — N2581 Secondary hyperparathyroidism of renal origin: Secondary | ICD-10-CM | POA: Diagnosis not present

## 2021-08-14 DIAGNOSIS — N186 End stage renal disease: Secondary | ICD-10-CM | POA: Diagnosis not present

## 2021-08-14 DIAGNOSIS — Z992 Dependence on renal dialysis: Secondary | ICD-10-CM | POA: Diagnosis not present

## 2021-08-16 DIAGNOSIS — Z992 Dependence on renal dialysis: Secondary | ICD-10-CM | POA: Diagnosis not present

## 2021-08-16 DIAGNOSIS — N186 End stage renal disease: Secondary | ICD-10-CM | POA: Diagnosis not present

## 2021-08-16 DIAGNOSIS — N2581 Secondary hyperparathyroidism of renal origin: Secondary | ICD-10-CM | POA: Diagnosis not present

## 2021-08-18 DIAGNOSIS — N2581 Secondary hyperparathyroidism of renal origin: Secondary | ICD-10-CM | POA: Diagnosis not present

## 2021-08-18 DIAGNOSIS — N186 End stage renal disease: Secondary | ICD-10-CM | POA: Diagnosis not present

## 2021-08-18 DIAGNOSIS — Z992 Dependence on renal dialysis: Secondary | ICD-10-CM | POA: Diagnosis not present

## 2021-08-18 DIAGNOSIS — I129 Hypertensive chronic kidney disease with stage 1 through stage 4 chronic kidney disease, or unspecified chronic kidney disease: Secondary | ICD-10-CM | POA: Diagnosis not present

## 2021-08-21 DIAGNOSIS — N186 End stage renal disease: Secondary | ICD-10-CM | POA: Diagnosis not present

## 2021-08-21 DIAGNOSIS — Z992 Dependence on renal dialysis: Secondary | ICD-10-CM | POA: Diagnosis not present

## 2021-08-21 DIAGNOSIS — N2581 Secondary hyperparathyroidism of renal origin: Secondary | ICD-10-CM | POA: Diagnosis not present

## 2021-08-23 DIAGNOSIS — N186 End stage renal disease: Secondary | ICD-10-CM | POA: Diagnosis not present

## 2021-08-23 DIAGNOSIS — N2581 Secondary hyperparathyroidism of renal origin: Secondary | ICD-10-CM | POA: Diagnosis not present

## 2021-08-23 DIAGNOSIS — Z992 Dependence on renal dialysis: Secondary | ICD-10-CM | POA: Diagnosis not present

## 2021-08-25 DIAGNOSIS — N186 End stage renal disease: Secondary | ICD-10-CM | POA: Diagnosis not present

## 2021-08-25 DIAGNOSIS — Z992 Dependence on renal dialysis: Secondary | ICD-10-CM | POA: Diagnosis not present

## 2021-08-25 DIAGNOSIS — N2581 Secondary hyperparathyroidism of renal origin: Secondary | ICD-10-CM | POA: Diagnosis not present

## 2021-08-28 DIAGNOSIS — N186 End stage renal disease: Secondary | ICD-10-CM | POA: Diagnosis not present

## 2021-08-28 DIAGNOSIS — N2581 Secondary hyperparathyroidism of renal origin: Secondary | ICD-10-CM | POA: Diagnosis not present

## 2021-08-28 DIAGNOSIS — Z992 Dependence on renal dialysis: Secondary | ICD-10-CM | POA: Diagnosis not present

## 2021-08-30 DIAGNOSIS — N2581 Secondary hyperparathyroidism of renal origin: Secondary | ICD-10-CM | POA: Diagnosis not present

## 2021-08-30 DIAGNOSIS — Z992 Dependence on renal dialysis: Secondary | ICD-10-CM | POA: Diagnosis not present

## 2021-08-30 DIAGNOSIS — N186 End stage renal disease: Secondary | ICD-10-CM | POA: Diagnosis not present

## 2021-09-01 DIAGNOSIS — N186 End stage renal disease: Secondary | ICD-10-CM | POA: Diagnosis not present

## 2021-09-01 DIAGNOSIS — Z992 Dependence on renal dialysis: Secondary | ICD-10-CM | POA: Diagnosis not present

## 2021-09-01 DIAGNOSIS — N2581 Secondary hyperparathyroidism of renal origin: Secondary | ICD-10-CM | POA: Diagnosis not present

## 2021-09-04 DIAGNOSIS — N186 End stage renal disease: Secondary | ICD-10-CM | POA: Diagnosis not present

## 2021-09-04 DIAGNOSIS — N2581 Secondary hyperparathyroidism of renal origin: Secondary | ICD-10-CM | POA: Diagnosis not present

## 2021-09-04 DIAGNOSIS — Z992 Dependence on renal dialysis: Secondary | ICD-10-CM | POA: Diagnosis not present

## 2021-09-06 DIAGNOSIS — Z992 Dependence on renal dialysis: Secondary | ICD-10-CM | POA: Diagnosis not present

## 2021-09-06 DIAGNOSIS — N186 End stage renal disease: Secondary | ICD-10-CM | POA: Diagnosis not present

## 2021-09-06 DIAGNOSIS — N2581 Secondary hyperparathyroidism of renal origin: Secondary | ICD-10-CM | POA: Diagnosis not present

## 2021-09-08 DIAGNOSIS — N186 End stage renal disease: Secondary | ICD-10-CM | POA: Diagnosis not present

## 2021-09-08 DIAGNOSIS — N2581 Secondary hyperparathyroidism of renal origin: Secondary | ICD-10-CM | POA: Diagnosis not present

## 2021-09-08 DIAGNOSIS — Z992 Dependence on renal dialysis: Secondary | ICD-10-CM | POA: Diagnosis not present

## 2021-09-11 DIAGNOSIS — N2581 Secondary hyperparathyroidism of renal origin: Secondary | ICD-10-CM | POA: Diagnosis not present

## 2021-09-11 DIAGNOSIS — Z992 Dependence on renal dialysis: Secondary | ICD-10-CM | POA: Diagnosis not present

## 2021-09-11 DIAGNOSIS — N186 End stage renal disease: Secondary | ICD-10-CM | POA: Diagnosis not present

## 2021-09-13 DIAGNOSIS — Z992 Dependence on renal dialysis: Secondary | ICD-10-CM | POA: Diagnosis not present

## 2021-09-13 DIAGNOSIS — N186 End stage renal disease: Secondary | ICD-10-CM | POA: Diagnosis not present

## 2021-09-13 DIAGNOSIS — N2581 Secondary hyperparathyroidism of renal origin: Secondary | ICD-10-CM | POA: Diagnosis not present

## 2021-09-15 DIAGNOSIS — Z992 Dependence on renal dialysis: Secondary | ICD-10-CM | POA: Diagnosis not present

## 2021-09-15 DIAGNOSIS — N2581 Secondary hyperparathyroidism of renal origin: Secondary | ICD-10-CM | POA: Diagnosis not present

## 2021-09-15 DIAGNOSIS — N186 End stage renal disease: Secondary | ICD-10-CM | POA: Diagnosis not present

## 2021-09-18 DIAGNOSIS — I129 Hypertensive chronic kidney disease with stage 1 through stage 4 chronic kidney disease, or unspecified chronic kidney disease: Secondary | ICD-10-CM | POA: Diagnosis not present

## 2021-09-18 DIAGNOSIS — N186 End stage renal disease: Secondary | ICD-10-CM | POA: Diagnosis not present

## 2021-09-18 DIAGNOSIS — Z992 Dependence on renal dialysis: Secondary | ICD-10-CM | POA: Diagnosis not present

## 2021-09-18 DIAGNOSIS — N2581 Secondary hyperparathyroidism of renal origin: Secondary | ICD-10-CM | POA: Diagnosis not present

## 2021-09-20 DIAGNOSIS — Z992 Dependence on renal dialysis: Secondary | ICD-10-CM | POA: Diagnosis not present

## 2021-09-20 DIAGNOSIS — N2581 Secondary hyperparathyroidism of renal origin: Secondary | ICD-10-CM | POA: Diagnosis not present

## 2021-09-20 DIAGNOSIS — N186 End stage renal disease: Secondary | ICD-10-CM | POA: Diagnosis not present

## 2021-09-22 DIAGNOSIS — N2581 Secondary hyperparathyroidism of renal origin: Secondary | ICD-10-CM | POA: Diagnosis not present

## 2021-09-22 DIAGNOSIS — N186 End stage renal disease: Secondary | ICD-10-CM | POA: Diagnosis not present

## 2021-09-22 DIAGNOSIS — Z992 Dependence on renal dialysis: Secondary | ICD-10-CM | POA: Diagnosis not present

## 2021-09-25 DIAGNOSIS — N2581 Secondary hyperparathyroidism of renal origin: Secondary | ICD-10-CM | POA: Diagnosis not present

## 2021-09-25 DIAGNOSIS — Z992 Dependence on renal dialysis: Secondary | ICD-10-CM | POA: Diagnosis not present

## 2021-09-25 DIAGNOSIS — N186 End stage renal disease: Secondary | ICD-10-CM | POA: Diagnosis not present

## 2021-09-27 DIAGNOSIS — N2581 Secondary hyperparathyroidism of renal origin: Secondary | ICD-10-CM | POA: Diagnosis not present

## 2021-09-27 DIAGNOSIS — N186 End stage renal disease: Secondary | ICD-10-CM | POA: Diagnosis not present

## 2021-09-27 DIAGNOSIS — Z992 Dependence on renal dialysis: Secondary | ICD-10-CM | POA: Diagnosis not present

## 2021-09-29 DIAGNOSIS — N186 End stage renal disease: Secondary | ICD-10-CM | POA: Diagnosis not present

## 2021-09-29 DIAGNOSIS — N2581 Secondary hyperparathyroidism of renal origin: Secondary | ICD-10-CM | POA: Diagnosis not present

## 2021-09-29 DIAGNOSIS — Z992 Dependence on renal dialysis: Secondary | ICD-10-CM | POA: Diagnosis not present

## 2021-10-02 DIAGNOSIS — N186 End stage renal disease: Secondary | ICD-10-CM | POA: Diagnosis not present

## 2021-10-02 DIAGNOSIS — Z992 Dependence on renal dialysis: Secondary | ICD-10-CM | POA: Diagnosis not present

## 2021-10-02 DIAGNOSIS — N2581 Secondary hyperparathyroidism of renal origin: Secondary | ICD-10-CM | POA: Diagnosis not present

## 2021-10-04 DIAGNOSIS — Z992 Dependence on renal dialysis: Secondary | ICD-10-CM | POA: Diagnosis not present

## 2021-10-04 DIAGNOSIS — N186 End stage renal disease: Secondary | ICD-10-CM | POA: Diagnosis not present

## 2021-10-04 DIAGNOSIS — N2581 Secondary hyperparathyroidism of renal origin: Secondary | ICD-10-CM | POA: Diagnosis not present

## 2021-10-06 DIAGNOSIS — N186 End stage renal disease: Secondary | ICD-10-CM | POA: Diagnosis not present

## 2021-10-06 DIAGNOSIS — Z992 Dependence on renal dialysis: Secondary | ICD-10-CM | POA: Diagnosis not present

## 2021-10-06 DIAGNOSIS — N2581 Secondary hyperparathyroidism of renal origin: Secondary | ICD-10-CM | POA: Diagnosis not present

## 2021-10-09 DIAGNOSIS — Z992 Dependence on renal dialysis: Secondary | ICD-10-CM | POA: Diagnosis not present

## 2021-10-09 DIAGNOSIS — N186 End stage renal disease: Secondary | ICD-10-CM | POA: Diagnosis not present

## 2021-10-09 DIAGNOSIS — N2581 Secondary hyperparathyroidism of renal origin: Secondary | ICD-10-CM | POA: Diagnosis not present

## 2021-10-10 ENCOUNTER — Encounter (HOSPITAL_COMMUNITY): Payer: Self-pay | Admitting: Gastroenterology

## 2021-10-11 DIAGNOSIS — N186 End stage renal disease: Secondary | ICD-10-CM | POA: Diagnosis not present

## 2021-10-11 DIAGNOSIS — N2581 Secondary hyperparathyroidism of renal origin: Secondary | ICD-10-CM | POA: Diagnosis not present

## 2021-10-11 DIAGNOSIS — Z992 Dependence on renal dialysis: Secondary | ICD-10-CM | POA: Diagnosis not present

## 2021-10-12 DIAGNOSIS — Z951 Presence of aortocoronary bypass graft: Secondary | ICD-10-CM | POA: Diagnosis not present

## 2021-10-12 DIAGNOSIS — E785 Hyperlipidemia, unspecified: Secondary | ICD-10-CM | POA: Diagnosis not present

## 2021-10-12 DIAGNOSIS — I10 Essential (primary) hypertension: Secondary | ICD-10-CM | POA: Diagnosis not present

## 2021-10-12 DIAGNOSIS — I2510 Atherosclerotic heart disease of native coronary artery without angina pectoris: Secondary | ICD-10-CM | POA: Diagnosis not present

## 2021-10-13 DIAGNOSIS — N186 End stage renal disease: Secondary | ICD-10-CM | POA: Diagnosis not present

## 2021-10-13 DIAGNOSIS — Z992 Dependence on renal dialysis: Secondary | ICD-10-CM | POA: Diagnosis not present

## 2021-10-13 DIAGNOSIS — N2581 Secondary hyperparathyroidism of renal origin: Secondary | ICD-10-CM | POA: Diagnosis not present

## 2021-10-16 DIAGNOSIS — N186 End stage renal disease: Secondary | ICD-10-CM | POA: Diagnosis not present

## 2021-10-16 DIAGNOSIS — N2581 Secondary hyperparathyroidism of renal origin: Secondary | ICD-10-CM | POA: Diagnosis not present

## 2021-10-16 DIAGNOSIS — Z992 Dependence on renal dialysis: Secondary | ICD-10-CM | POA: Diagnosis not present

## 2021-10-16 NOTE — Anesthesia Preprocedure Evaluation (Signed)
Anesthesia Evaluation  Patient identified by MRN, date of birth, ID band Patient awake    Reviewed: Allergy & Precautions, NPO status , Patient's Chart, lab work & pertinent test results  Airway Mallampati: II       Dental no notable dental hx.    Pulmonary former smoker,    Pulmonary exam normal        Cardiovascular hypertension, Pt. on medications and Pt. on home beta blockers + CAD, + Past MI, + CABG and +CHF  Normal cardiovascular exam     Neuro/Psych negative neurological ROS  negative psych ROS   GI/Hepatic negative GI ROS, Neg liver ROS,   Endo/Other  negative endocrine ROS  Renal/GU ESRF and DialysisRenal disease  negative genitourinary   Musculoskeletal negative musculoskeletal ROS (+)   Abdominal (+) + obese,   Peds  Hematology   Anesthesia Other Findings   Reproductive/Obstetrics                            Anesthesia Physical Anesthesia Plan  ASA: 3  Anesthesia Plan: MAC   Post-op Pain Management:    Induction:   PONV Risk Score and Plan: Propofol infusion and TIVA  Airway Management Planned: Natural Airway, Simple Face Mask and Nasal Cannula  Additional Equipment: None  Intra-op Plan:   Post-operative Plan:   Informed Consent: I have reviewed the patients History and Physical, chart, labs and discussed the procedure including the risks, benefits and alternatives for the proposed anesthesia with the patient or authorized representative who has indicated his/her understanding and acceptance.       Plan Discussed with: CRNA  Anesthesia Plan Comments:         Anesthesia Quick Evaluation

## 2021-10-17 ENCOUNTER — Encounter (HOSPITAL_COMMUNITY)
Admission: RE | Disposition: A | Discharge status: Home / Self Care | Payer: Self-pay | Source: Home / Self Care | Attending: Gastroenterology

## 2021-10-17 ENCOUNTER — Encounter (HOSPITAL_COMMUNITY): Payer: Self-pay | Admitting: Gastroenterology

## 2021-10-17 ENCOUNTER — Other Ambulatory Visit: Payer: Self-pay

## 2021-10-17 ENCOUNTER — Ambulatory Visit (HOSPITAL_COMMUNITY): Payer: Medicare HMO | Admitting: Anesthesiology

## 2021-10-17 ENCOUNTER — Ambulatory Visit (HOSPITAL_COMMUNITY)
Admission: RE | Admit: 2021-10-17 | Discharge: 2021-10-17 | Disposition: A | Discharge status: Home / Self Care | Payer: Medicare HMO | Attending: Gastroenterology | Admitting: Gastroenterology

## 2021-10-17 ENCOUNTER — Ambulatory Visit (HOSPITAL_BASED_OUTPATIENT_CLINIC_OR_DEPARTMENT_OTHER): Payer: Medicare HMO | Admitting: Anesthesiology

## 2021-10-17 DIAGNOSIS — K635 Polyp of colon: Secondary | ICD-10-CM

## 2021-10-17 DIAGNOSIS — I4891 Unspecified atrial fibrillation: Secondary | ICD-10-CM | POA: Insufficient documentation

## 2021-10-17 DIAGNOSIS — K648 Other hemorrhoids: Secondary | ICD-10-CM | POA: Diagnosis not present

## 2021-10-17 DIAGNOSIS — Z79899 Other long term (current) drug therapy: Secondary | ICD-10-CM | POA: Diagnosis not present

## 2021-10-17 DIAGNOSIS — Z86010 Personal history of colonic polyps: Secondary | ICD-10-CM | POA: Diagnosis not present

## 2021-10-17 DIAGNOSIS — K644 Residual hemorrhoidal skin tags: Secondary | ICD-10-CM | POA: Diagnosis not present

## 2021-10-17 DIAGNOSIS — Z87891 Personal history of nicotine dependence: Secondary | ICD-10-CM | POA: Insufficient documentation

## 2021-10-17 DIAGNOSIS — K5730 Diverticulosis of large intestine without perforation or abscess without bleeding: Secondary | ICD-10-CM | POA: Diagnosis not present

## 2021-10-17 DIAGNOSIS — Q438 Other specified congenital malformations of intestine: Secondary | ICD-10-CM | POA: Insufficient documentation

## 2021-10-17 DIAGNOSIS — Z09 Encounter for follow-up examination after completed treatment for conditions other than malignant neoplasm: Secondary | ICD-10-CM | POA: Diagnosis not present

## 2021-10-17 DIAGNOSIS — I5042 Chronic combined systolic (congestive) and diastolic (congestive) heart failure: Secondary | ICD-10-CM | POA: Insufficient documentation

## 2021-10-17 DIAGNOSIS — Z951 Presence of aortocoronary bypass graft: Secondary | ICD-10-CM | POA: Insufficient documentation

## 2021-10-17 DIAGNOSIS — N186 End stage renal disease: Secondary | ICD-10-CM | POA: Insufficient documentation

## 2021-10-17 DIAGNOSIS — I2510 Atherosclerotic heart disease of native coronary artery without angina pectoris: Secondary | ICD-10-CM | POA: Diagnosis not present

## 2021-10-17 DIAGNOSIS — I252 Old myocardial infarction: Secondary | ICD-10-CM | POA: Diagnosis not present

## 2021-10-17 DIAGNOSIS — K621 Rectal polyp: Secondary | ICD-10-CM | POA: Diagnosis not present

## 2021-10-17 DIAGNOSIS — Z992 Dependence on renal dialysis: Secondary | ICD-10-CM | POA: Insufficient documentation

## 2021-10-17 DIAGNOSIS — Z6833 Body mass index (BMI) 33.0-33.9, adult: Secondary | ICD-10-CM | POA: Diagnosis not present

## 2021-10-17 DIAGNOSIS — I255 Ischemic cardiomyopathy: Secondary | ICD-10-CM | POA: Diagnosis not present

## 2021-10-17 DIAGNOSIS — E669 Obesity, unspecified: Secondary | ICD-10-CM | POA: Diagnosis not present

## 2021-10-17 DIAGNOSIS — I132 Hypertensive heart and chronic kidney disease with heart failure and with stage 5 chronic kidney disease, or end stage renal disease: Secondary | ICD-10-CM | POA: Diagnosis not present

## 2021-10-17 DIAGNOSIS — Z139 Encounter for screening, unspecified: Secondary | ICD-10-CM | POA: Diagnosis not present

## 2021-10-17 HISTORY — PX: BIOPSY: SHX5522

## 2021-10-17 HISTORY — PX: POLYPECTOMY: SHX5525

## 2021-10-17 HISTORY — PX: COLONOSCOPY WITH PROPOFOL: SHX5780

## 2021-10-17 LAB — POCT I-STAT, CHEM 8
BUN: 31 mg/dL — ABNORMAL HIGH (ref 6–20)
Calcium, Ion: 1.09 mmol/L — ABNORMAL LOW (ref 1.15–1.40)
Chloride: 93 mmol/L — ABNORMAL LOW (ref 98–111)
Creatinine, Ser: 11.5 mg/dL — ABNORMAL HIGH (ref 0.61–1.24)
Glucose, Bld: 92 mg/dL (ref 70–99)
HCT: 39 % (ref 39.0–52.0)
Hemoglobin: 13.3 g/dL (ref 13.0–17.0)
Potassium: 3.4 mmol/L — ABNORMAL LOW (ref 3.5–5.1)
Sodium: 137 mmol/L (ref 135–145)
TCO2: 32 mmol/L (ref 22–32)

## 2021-10-17 SURGERY — COLONOSCOPY WITH PROPOFOL
Anesthesia: Monitor Anesthesia Care

## 2021-10-17 MED ORDER — PROPOFOL 1000 MG/100ML IV EMUL
INTRAVENOUS | Status: AC
  Filled 2021-10-17: qty 100

## 2021-10-17 MED ORDER — SODIUM CHLORIDE 0.9 % IV SOLN
INTRAVENOUS | Status: DC
  Administered 2021-10-17: 500 mL via INTRAVENOUS

## 2021-10-17 MED ORDER — PROPOFOL 500 MG/50ML IV EMUL
INTRAVENOUS | Status: AC
  Filled 2021-10-17: qty 50

## 2021-10-17 MED ORDER — PROPOFOL 500 MG/50ML IV EMUL
INTRAVENOUS | Status: DC | PRN
  Administered 2021-10-17: 125 ug/kg/min via INTRAVENOUS

## 2021-10-17 MED ORDER — LACTATED RINGERS IV SOLN: INTRAVENOUS | Status: DC

## 2021-10-17 MED ORDER — GLYCOPYRROLATE PF 0.2 MG/ML IJ SOSY
PREFILLED_SYRINGE | INTRAMUSCULAR | Status: DC | PRN
  Administered 2021-10-17 (×2): .1 mg via INTRAVENOUS

## 2021-10-17 MED ORDER — PROPOFOL 10 MG/ML IV BOLUS
INTRAVENOUS | Status: DC | PRN
  Administered 2021-10-17: 10 mg via INTRAVENOUS

## 2021-10-17 SURGICAL SUPPLY — 22 items

## 2021-10-17 NOTE — H&P (Signed)
Primary Care Physician:  Ladell Pier, MD Primary Gastroenterologist:  Sadie Haber   Reason for Visit : Outpatient screening colonoscopy  HPI: Seth Keller is a 56 y.o. male with past medical history of coronary artery disease, history of cardiomyopathy, history of atrial fibrillation here for outpatient screening colonoscopy.  Patient with history of end-stage renal disease on dialysis.  Last colonoscopy in 2020 was aborted due to reaching at hepatic flexure because of severe bradycardia .  Patient had a fair prep during his last colonoscopy.  Denies any acute symptoms today.  Past Medical History:  Diagnosis Date   Atrial fibrillation with RVR ([place])    a. 12/2013 post-op CABG ->on amio.   CAD (coronary artery disease)    a. 12/2013 NSTEMI/Cath: 3VD;  b. 12/2013 CABG x 4: LIMA->LAD, VG->RI, VG->OM1->LPDA.   Chronic combined systolic and diastolic CHF (congestive heart failure) ([place])    a. 12/2013 Echo: EF 45%, Gr 2 DD.   CKD (chronic kidney disease), stage III ([facility])    Dyspnea    Hypertension    a. Dx ~ 2000.   Ischemic cardiomyopathy    a. 12/2013 Echo: EF 45%, Gr 2 DD, basal-mid inflat and inf AK, mild MR.   Myocardial infarction [facility])    Tobacco abuse     Past Surgical History:  Procedure Laterality Date   AV FISTULA PLACEMENT Right 08/15/2020   Procedure: RIGHT ARM RADIOCEPHALIC ARTERIOVENOUS (AV) FISTULA CREATION;  Surgeon: Cherre Robins, MD;  Location: [place] [facility];  Service: Vascular;  Laterality: Right;   CARDIAC CATHETERIZATION     CORONARY ARTERY BYPASS GRAFT N/A 01/01/2014   Procedure: CORONARY ARTERY BYPASS GRAFTING (CABG) times four using left internal mammary artery and right saphenous vein;  Surgeon: Gaye Pollack, MD;  Location: [facility] OR;  Service: Open Heart Surgery;  Laterality: N/A;   INTRAOPERATIVE TRANSESOPHAGEAL ECHOCARDIOGRAM N/A 01/01/2014   Procedure: INTRAOPERATIVE TRANSESOPHAGEAL ECHOCARDIOGRAM;  Surgeon: Gaye Pollack, MD;  Location: [facility] OR;  Service: Open Heart  Surgery;  Laterality: N/A;   LEFT HEART CATHETERIZATION WITH CORONARY ANGIOGRAM N/A 12/29/2013   Procedure: LEFT HEART CATHETERIZATION WITH CORONARY ANGIOGRAM;  Surgeon: Sinclair Grooms, MD;  Location: [facility] CATH LAB;  Service: Cardiovascular;  Laterality: N/A;   none      Prior to Admission medications   Medication Sig Start Date End Date Taking? Authorizing Provider  amLODipine (NORVASC) 10 MG tablet Take 1 tablet by mouth once daily 05/09/21  Yes Ladell Pier, MD  aspirin EC 81 MG tablet Take 1 tablet (81 mg total) by mouth daily. 10/27/20  Yes Fenton Foy, NP  atorvastatin (LIPITOR) 80 MG tablet Take 1 tablet (80 mg total) by mouth daily. 05/10/21  Yes Ladell Pier, MD  cholecalciferol (VITAMIN D3) 25 MCG (1000 UNIT) tablet Take 1,000 Units by mouth daily.   Yes [provider]  furosemide (LASIX) 40 MG tablet Take 1 tablet (40 mg total) by mouth daily. 05/10/21  Yes Ladell Pier, MD  isosorbide mononitrate (IMDUR) 60 MG 24 hr tablet Take 1 tablet (60 mg total) by mouth daily. 10/27/20  Yes Fenton Foy, NP  metoprolol tartrate (LOPRESSOR) 25 MG tablet Take 1 tablet (25 mg total) by mouth 2 (two) times daily. 05/09/21  Yes Ladell Pier, MD  sucroferric oxyhydroxide (VELPHORO) 500 MG chewable tablet Chew 500-1,500 mg by mouth See admin instructions. Take 1500 mg with each meal and 500 mg with each snack   Yes [provider]  Blood Pressure  Monitor DEVI Use as directed to check home blood pressure 2-3 times a week 09/25/18   Ladell Pier, MD  nitroGLYCERIN (NITROSTAT) 0.4 MG SL tablet Place 1 tablet (0.4 mg total) under the tongue every 5 (five) minutes as needed for chest pain. 07/06/19   Ladell Pier, MD    Scheduled Meds: Continuous Infusions:  sodium chloride 500 mL (10/17/21 0654)   PRN Meds:.  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   From [place], originally.   Moved to [place] in 2012.    Social Determinants of Health   Financial Resource Strain: Not on file  Food Insecurity: Not on file  Transportation Needs: Not on file  Physical Activity: Not on file  Stress: Not on file  Social Connections: Not on file  Intimate Partner Violence: Not on file    Review of Systems: All negative except as stated above in HPI.  Physical Exam: Vital signs: Vitals:   10/17/21 0644  BP: 124/64  Pulse: 70  Resp: 17  Temp: 98.2 F (36.8 C)  SpO2: 97%     General:   Alert,  Well-developed, well-nourished, pleasant and cooperative in NAD Lungs:  Clear throughout to auscultation.   No wheezes, crackles, or rhonchi. No acute distress. Heart:  Regular rate and rhythm; no murmurs, clicks, rubs,  or gallops. Abdomen: Soft, nontender, nondistended, bowel sounds present, no  peritoneal signs Rectal:  Deferred  GI:  Lab Results: Recent Labs    10/17/21 0650  HGB 13.3  HCT 39.0   BMET Recent Labs    10/17/21 0650  NA 137  K 3.4*  CL 93*  GLUCOSE 92  BUN 31*  CREATININE 11.50*   LFT No results for input(s): "PROT", "ALBUMIN", "AST", "ALT", "ALKPHOS", "BILITOT", "BILIDIR", "IBILI" in the last 72 hours. PT/INR No results for input(s): "LABPROT", "INR" in the last 72 hours.   Studies/Results: No results found.  Impression/Plan: - Colon Cancer screening.  Colonoscopy in July 2020 was aborted because of severe bradycardia.  History of suboptimal prep in the past. -End-stage renal disease on dialysis -History of coronary disease with  cardiomyopathy -H/O atrial fibrillation  Recommendations ------------------------ -Proceed with colonoscopy today.  Risks (bleeding, infection, bowel perforation that could require surgery, sedation-related changes in cardiopulmonary systems), benefits (identification and possible treatment of source of symptoms, exclusion of certain causes of symptoms), and alternatives (watchful waiting, radiographic imaging studies, empiric medical treatment)  were explained to patient/family in detail and patient wishes to proceed.     LOS: 0 days   Otis Brace  MD, FACP 10/17/2021, 7:45 AM  Contact #  [phone]

## 2021-10-17 NOTE — Transfer of Care (Signed)
Immediate Anesthesia Transfer of Care Note  Patient: Seth Keller  Procedure(s) Performed: COLONOSCOPY WITH PROPOFOL POLYPECTOMY BIOPSY  Patient Location: PACU  Anesthesia Type:MAC  Level of Consciousness: awake, alert  and oriented  Airway & Oxygen Therapy: Patient Spontanous Breathing and Patient connected to face mask oxygen  Post-op Assessment: Report given to RN, Post -op Vital signs reviewed and stable and Patient moving all extremities X 4  Post vital signs: Reviewed and stable  Last Vitals:  Vitals Value Taken Time  BP 116/64   Temp    Pulse 91 10/17/21 0824  Resp 18 10/17/21 0824  SpO2 100 % 10/17/21 0824  Vitals shown include unvalidated device data.  Last Pain:  Vitals:   10/17/21 0644  TempSrc: Tympanic  PainSc: 0-No pain         Complications: No notable events documented.

## 2021-10-17 NOTE — Anesthesia Procedure Notes (Signed)
Procedure Name: MAC Date/Time: 10/17/2021 7:52 AM  Performed by: Maxwell Caul, CRNAPre-anesthesia Checklist: Patient identified, Emergency Drugs available, Suction available and Patient being monitored Oxygen Delivery Method: Simple face mask

## 2021-10-17 NOTE — Anesthesia Postprocedure Evaluation (Signed)
Anesthesia Post Note  Patient: Seth Keller  Procedure(s) Performed: COLONOSCOPY WITH PROPOFOL POLYPECTOMY BIOPSY     Patient location during evaluation: Endoscopy Anesthesia Type: MAC Level of consciousness: awake Pain management: pain level controlled Vital Signs Assessment: post-procedure vital signs reviewed and stable Respiratory status: spontaneous breathing Cardiovascular status: stable Postop Assessment: no apparent nausea or vomiting Anesthetic complications: no   No notable events documented.  Last Vitals:  Vitals:   10/17/21 0840 10/17/21 0850  BP: 135/73   Pulse: 81   Resp: 19 20  Temp:    SpO2: 100%     Last Pain:  Vitals:   10/17/21 0840  TempSrc:   PainSc: 0-No pain                 Huston Foley

## 2021-10-17 NOTE — Discharge Instructions (Signed)
YOU HAD AN ENDOSCOPIC PROCEDURE TODAY: Refer to the procedure report and other information in the discharge instructions given to you for any specific questions about what was found during the examination. If this information does not answer your questions, please call [facility] office at [phone] to clarify.   YOU SHOULD EXPECT: Some feelings of bloating in the abdomen. Passage of more gas than usual. Walking can help get rid of the air that was put into your GI tract during the procedure and reduce the bloating. If you had a lower endoscopy (such as a colonoscopy or flexible sigmoidoscopy) you may notice spotting of blood in your stool or on the toilet paper. Some abdominal soreness may be present for a day or two, also.  DIET: Your first meal following the procedure should be a light meal and then it is ok to progress to your normal diet. A half-sandwich or bowl of soup is an example of a good first meal. Heavy or fried foods are harder to digest and may make you feel nauseous or bloated. Drink plenty of fluids but you should avoid alcoholic beverages for 24 hours. If you had a esophageal dilation, please see attached instructions for diet.    ACTIVITY: Your care partner should take you home directly after the procedure. You should plan to take it easy, moving slowly for the rest of the day. You can resume normal activity the day after the procedure however YOU SHOULD NOT DRIVE, use power tools, machinery or perform tasks that involve climbing or major physical exertion for 24 hours (because of the sedation medicines used during the test).   SYMPTOMS TO REPORT IMMEDIATELY: A gastroenterologist can be reached at any hour. Please call [phone]  for any of the following symptoms:  Following lower endoscopy (colonoscopy, flexible sigmoidoscopy) Excessive amounts of blood in the stool  Significant tenderness, worsening of abdominal pains  Swelling of the abdomen that is new, acute  Fever of 100  or higher   FOLLOW UP:  If any biopsies were taken you will be contacted by phone or by letter within the next 1-3 weeks. Call [phone]  if you have not heard about the biopsies in 3 weeks.  Please also call with any specific questions about appointments or follow up tests. YOU HAD AN ENDOSCOPIC PROCEDURE TODAY: Refer to the procedure report and other information in the discharge instructions given to you for any specific questions about what was found during the examination. If this information does not answer your questions, please call [facility] office at [phone] to clarify.  

## 2021-10-17 NOTE — Op Note (Signed)
[facility] [place] Patient Name: Seth Keller Procedure Date: 10/17/2021 MRN: [id number] Attending MD: Otis Brace , MD Date of Birth: Nov 09, 1965 CSN: [id number] Age: 56 Admit Type: Outpatient Procedure:                Colonoscopy Indications:              High risk colon cancer surveillance: Personal                            history of colonic polyps, Last colonoscopy: 2020 Providers:                Otis Brace, MD, Ladoris Gene, RN, Fransico Setters                            [place], Technician Referring MD:              Medicines:                Sedation Administered by an Anesthesia Professional Complications:            No immediate complications. Estimated Blood Loss:     Estimated blood loss was minimal. Procedure:                Pre-Anesthesia Assessment:                           - Prior to the procedure, a History and Physical                            was performed, and patient medications and                            allergies were reviewed. The patient's tolerance of                            previous anesthesia was also reviewed. The risks                            and benefits of the procedure and the sedation                            options and risks were discussed with the patient.                            All questions were answered, and informed consent                            was obtained. Prior Anticoagulants: The patient has                            taken no previous anticoagulant or antiplatelet                            agents. ASA Grade Assessment: III - A patient with  severe systemic disease. After reviewing the risks                            and benefits, the patient was deemed in                            satisfactory condition to undergo the procedure.                           After obtaining informed consent, the colonoscope                            was passed under direct vision. Throughout the                             procedure, the patient's blood pressure, pulse, and                            oxygen saturations were monitored continuously. The                            PCF-HQ190L ([id number]) Olympus colonoscope was                            introduced through the anus and advanced to the the                            cecum, identified by appendiceal orifice and                            ileocecal valve. The colonoscopy was performed with                            moderate difficulty due to a tortuous colon.                            Successful completion of the procedure was aided by                            applying abdominal pressure. The patient tolerated                            the procedure well. The quality of the bowel                            preparation was fair. Scope In: 7:58:23 AM Scope Out: 8:18:39 AM Scope Withdrawal Time: 0 hours 8 minutes 17 seconds  Total Procedure Duration: 0 hours 20 minutes 16 seconds  Findings:      Skin tags were found on perianal exam.      A 5 mm polyp was found in the hepatic flexure. The polyp was sessile.       The polyp was removed with a cold snare. Resection and retrieval were       complete.  Scattered diverticula were found in the entire colon.      A few hyperplastic polyps were found in the rectum and recto-sigmoid       colon. The polyps were small in size. Biopsies were taken with a cold       forceps for histology.      Internal hemorrhoids were found during retroflexion. The hemorrhoids       were medium-sized. Impression:               - Preparation of the colon was fair.                           - Perianal skin tags found on perianal exam.                           - One 5 mm polyp at the hepatic flexure, removed                            with a cold snare. Resected and retrieved.                           - Diverticulosis in the entire examined colon.                           - A few small  polyps in the rectum and at the                            recto-sigmoid colon. Biopsied.                           - Internal hemorrhoids. Moderate Sedation:      Moderate (conscious) sedation was personally administered by an       anesthesia professional. The following parameters were monitored: oxygen       saturation, heart rate, blood pressure, and response to care. Recommendation:           - Patient has a contact number available for                            emergencies. The signs and symptoms of potential                            delayed complications were discussed with the                            patient. Return to normal activities tomorrow.                            Written discharge instructions were provided to the                            patient.                           - Resume previous diet.                           -  Continue present medications.                           - Await pathology results.                           - Repeat colonoscopy in 3 - 5 years for                            surveillance based on pathology results.                           - Return to my office PRN. Procedure Code(s):        --- Professional ---                           [phone], Colonoscopy, flexible; with removal of                            tumor(s), polyp(s), or other lesion(s) by snare                            technique                           [id number], 48, Colonoscopy, flexible; with biopsy,                            single or multiple Diagnosis Code(s):        --- Professional ---                           K63.5, Polyp of colon                           Z86.010, Personal history of colonic polyps                           K64.8, Other hemorrhoids                           K64.4, Residual hemorrhoidal skin tags                           K57.30, Diverticulosis of large intestine without                            perforation or abscess without bleeding CPT  copyright 2019 American Medical Association. All rights reserved. The codes documented in this report are preliminary and upon coder review may  be revised to meet current compliance requirements. Otis Brace, MD Otis Brace, MD 10/17/2021 8:32:16 AM Number of Addenda: 0

## 2021-10-18 DIAGNOSIS — N186 End stage renal disease: Secondary | ICD-10-CM | POA: Diagnosis not present

## 2021-10-18 DIAGNOSIS — N2581 Secondary hyperparathyroidism of renal origin: Secondary | ICD-10-CM | POA: Diagnosis not present

## 2021-10-18 DIAGNOSIS — Z992 Dependence on renal dialysis: Secondary | ICD-10-CM | POA: Diagnosis not present

## 2021-10-19 DIAGNOSIS — N186 End stage renal disease: Secondary | ICD-10-CM | POA: Diagnosis not present

## 2021-10-19 DIAGNOSIS — Z992 Dependence on renal dialysis: Secondary | ICD-10-CM | POA: Diagnosis not present

## 2021-10-19 DIAGNOSIS — I129 Hypertensive chronic kidney disease with stage 1 through stage 4 chronic kidney disease, or unspecified chronic kidney disease: Secondary | ICD-10-CM | POA: Diagnosis not present

## 2021-10-19 LAB — SURGICAL PATHOLOGY

## 2021-10-20 ENCOUNTER — Encounter (HOSPITAL_COMMUNITY): Payer: Self-pay | Admitting: Gastroenterology

## 2021-10-20 DIAGNOSIS — Z992 Dependence on renal dialysis: Secondary | ICD-10-CM | POA: Diagnosis not present

## 2021-10-20 DIAGNOSIS — N2581 Secondary hyperparathyroidism of renal origin: Secondary | ICD-10-CM | POA: Diagnosis not present

## 2021-10-20 DIAGNOSIS — N186 End stage renal disease: Secondary | ICD-10-CM | POA: Diagnosis not present

## 2021-10-23 DIAGNOSIS — N186 End stage renal disease: Secondary | ICD-10-CM | POA: Diagnosis not present

## 2021-10-23 DIAGNOSIS — N2581 Secondary hyperparathyroidism of renal origin: Secondary | ICD-10-CM | POA: Diagnosis not present

## 2021-10-23 DIAGNOSIS — Z992 Dependence on renal dialysis: Secondary | ICD-10-CM | POA: Diagnosis not present

## 2021-10-25 DIAGNOSIS — Z992 Dependence on renal dialysis: Secondary | ICD-10-CM | POA: Diagnosis not present

## 2021-10-25 DIAGNOSIS — N2581 Secondary hyperparathyroidism of renal origin: Secondary | ICD-10-CM | POA: Diagnosis not present

## 2021-10-25 DIAGNOSIS — N186 End stage renal disease: Secondary | ICD-10-CM | POA: Diagnosis not present

## 2021-10-27 DIAGNOSIS — N186 End stage renal disease: Secondary | ICD-10-CM | POA: Diagnosis not present

## 2021-10-27 DIAGNOSIS — N2581 Secondary hyperparathyroidism of renal origin: Secondary | ICD-10-CM | POA: Diagnosis not present

## 2021-10-27 DIAGNOSIS — Z992 Dependence on renal dialysis: Secondary | ICD-10-CM | POA: Diagnosis not present

## 2021-10-30 DIAGNOSIS — N186 End stage renal disease: Secondary | ICD-10-CM | POA: Diagnosis not present

## 2021-10-30 DIAGNOSIS — N2581 Secondary hyperparathyroidism of renal origin: Secondary | ICD-10-CM | POA: Diagnosis not present

## 2021-10-30 DIAGNOSIS — Z992 Dependence on renal dialysis: Secondary | ICD-10-CM | POA: Diagnosis not present

## 2021-11-01 DIAGNOSIS — Z992 Dependence on renal dialysis: Secondary | ICD-10-CM | POA: Diagnosis not present

## 2021-11-01 DIAGNOSIS — N186 End stage renal disease: Secondary | ICD-10-CM | POA: Diagnosis not present

## 2021-11-01 DIAGNOSIS — N2581 Secondary hyperparathyroidism of renal origin: Secondary | ICD-10-CM | POA: Diagnosis not present

## 2021-11-03 DIAGNOSIS — N2581 Secondary hyperparathyroidism of renal origin: Secondary | ICD-10-CM | POA: Diagnosis not present

## 2021-11-03 DIAGNOSIS — Z992 Dependence on renal dialysis: Secondary | ICD-10-CM | POA: Diagnosis not present

## 2021-11-03 DIAGNOSIS — N186 End stage renal disease: Secondary | ICD-10-CM | POA: Diagnosis not present

## 2021-11-06 DIAGNOSIS — N186 End stage renal disease: Secondary | ICD-10-CM | POA: Diagnosis not present

## 2021-11-06 DIAGNOSIS — Z992 Dependence on renal dialysis: Secondary | ICD-10-CM | POA: Diagnosis not present

## 2021-11-06 DIAGNOSIS — N2581 Secondary hyperparathyroidism of renal origin: Secondary | ICD-10-CM | POA: Diagnosis not present

## 2021-11-08 DIAGNOSIS — Z992 Dependence on renal dialysis: Secondary | ICD-10-CM | POA: Diagnosis not present

## 2021-11-08 DIAGNOSIS — N186 End stage renal disease: Secondary | ICD-10-CM | POA: Diagnosis not present

## 2021-11-08 DIAGNOSIS — N2581 Secondary hyperparathyroidism of renal origin: Secondary | ICD-10-CM | POA: Diagnosis not present

## 2021-11-10 DIAGNOSIS — Z992 Dependence on renal dialysis: Secondary | ICD-10-CM | POA: Diagnosis not present

## 2021-11-10 DIAGNOSIS — N2581 Secondary hyperparathyroidism of renal origin: Secondary | ICD-10-CM | POA: Diagnosis not present

## 2021-11-10 DIAGNOSIS — N186 End stage renal disease: Secondary | ICD-10-CM | POA: Diagnosis not present

## 2021-11-13 DIAGNOSIS — N186 End stage renal disease: Secondary | ICD-10-CM | POA: Diagnosis not present

## 2021-11-13 DIAGNOSIS — Z992 Dependence on renal dialysis: Secondary | ICD-10-CM | POA: Diagnosis not present

## 2021-11-13 DIAGNOSIS — N2581 Secondary hyperparathyroidism of renal origin: Secondary | ICD-10-CM | POA: Diagnosis not present

## 2021-11-14 NOTE — Progress Notes (Unsigned)
Office Visit    Patient Name: Seth Keller Date of Encounter: 11/16/2021  PCP:  Ladell Pier, MD   [place]  Cardiologist:  Lauree Chandler, MD  Advanced Practice Provider:  No care team member to display Electrophysiologist:  None   HPI    Seth Keller is a 56 y.o. male with past medical history significant for CAD with previous CABG x4 on November 2015, hypertension, hyperlipidemia, CKD with ESRD and chronic hemodialysis presents today for routine follow-up appointment.  He was recently seen by [facility] cardiology about a month ago.  He is followed by them because he is a renal transplant candidate for end-stage renal disease.  His last echocardiogram was normal with an EF of 60 to 65% and no valvular abnormalities.  He had not had any angina, orthopnea, PND, lower extremity edema, palpitations or syncope.  Refills of all his cardiac medications were provided during that appointment.  His Imdur was discontinued.  He did have some mild hypotension.  Today, he tells me he does get SOB at times when walking but this has been stable. He used to play handball and his knees have been bothering him lately. No issues with lower extremity edema. He is still waiting on renal transplant and does not have a timeline. He does not take any of his medications before dialysis since his BP does drop low. He does take them afterwards. We discussed decreasing his Amlodipine which he has already been doing and cutting the pills in half has been a challenge.   Reports no chest pain, pressure, or tightness. No edema, orthopnea, PND. Reports no palpitations.     Past Medical History    Past Medical History:  Diagnosis Date   Atrial fibrillation with RVR ([place])    a. 12/2013 post-op CABG ->on amio.   CAD (coronary artery disease)    a. 12/2013 NSTEMI/Cath: 3VD;  b. 12/2013 CABG x 4: LIMA->LAD, VG->RI, VG->OM1->LPDA.   Chronic combined systolic and diastolic CHF  (congestive heart failure) ([place])    a. 12/2013 Echo: EF 45%, Gr 2 DD.   CKD (chronic kidney disease), stage III (HCC)    Dyspnea    Hypertension    a. Dx ~ 2000.   Ischemic cardiomyopathy    a. 12/2013 Echo: EF 45%, Gr 2 DD, basal-mid inflat and inf AK, mild MR.   Myocardial infarction [facility])    Tobacco abuse    Past Surgical History:  Procedure Laterality Date   AV FISTULA PLACEMENT Right 08/15/2020   Procedure: RIGHT ARM RADIOCEPHALIC ARTERIOVENOUS (AV) FISTULA CREATION;  Surgeon: Cherre Robins, MD;  Location: [place];  Service: Vascular;  Laterality: Right;   BIOPSY  10/17/2021   Procedure: BIOPSY;  Surgeon: Otis Brace, MD;  [facility];  Service: Gastroenterology;;   CARDIAC CATHETERIZATION     COLONOSCOPY WITH PROPOFOL N/A 10/17/2021   Procedure: COLONOSCOPY WITH PROPOFOL;  Surgeon: Otis Brace, MD;  [facility];  Service: Gastroenterology;  Laterality: N/A;   CORONARY ARTERY BYPASS GRAFT N/A 01/01/2014   Procedure: CORONARY ARTERY BYPASS GRAFTING (CABG) times four using left internal mammary artery and right saphenous vein;  Surgeon: Gaye Pollack, MD;  Location: [place] OR;  Service: Open Heart Surgery;  Laterality: N/A;   INTRAOPERATIVE TRANSESOPHAGEAL ECHOCARDIOGRAM N/A 01/01/2014   Procedure: INTRAOPERATIVE TRANSESOPHAGEAL ECHOCARDIOGRAM;  Surgeon: Gaye Pollack, MD;  Location: [place] OR;  Service: Open Heart Surgery;  Laterality: N/A;   LEFT HEART CATHETERIZATION WITH CORONARY ANGIOGRAM  N/A 12/29/2013   Procedure: LEFT HEART CATHETERIZATION WITH CORONARY ANGIOGRAM;  Surgeon: Sinclair Grooms, MD;  Location: [facility] CATH LAB;  Service: Cardiovascular;  Laterality: N/A;   none     POLYPECTOMY  10/17/2021   Procedure: POLYPECTOMY;  Surgeon: Otis Brace, MD;  [facility];  Service: Gastroenterology;;    Allergies  Allergies  Allergen Reactions   Iodinated Contrast Media Anaphylaxis   Shellfish Allergy Anaphylaxis    Throat close up,  swelling   EKGs/Labs/Other Studies Reviewed:   The following studies were reviewed today:  Echocardiogram 09/03/2016  Study Conclusions   - Left ventricle: The cavity size was normal. Wall thickness was    increased in a pattern of mild LVH. Systolic function was normal.    The estimated ejection fraction was in the range of 50% to 55%.    Wall motion was normal; there were no regional wall motion    abnormalities. Features are consistent with a pseudonormal left    ventricular filling pattern, with concomitant abnormal relaxation    and increased filling pressure (grade 2 diastolic dysfunction).   -------------------------------------------------------------------  Study data:  The previous study was not available, so comparison  was made to the report of 04/29/2014.  Study status:  Routine.  Procedure:  Transthoracic echocardiography. Image quality was  adequate.  Study completion:  There were no complications.  Transthoracic echocardiography.  M-mode, complete 2D, spectral  Doppler, and color Doppler.  Birthdate:  Patient birthdate:  [birth year].  Age:  Patient is 56 yr old.  Sex:  Gender: male.  BMI: 35.8 kg/m^2.  Blood pressure:     146/81  Patient status:  Outpatient.  Study date:  Study date: 09/03/2016. Study time: 02:55  PM.  Location:  Echo laboratory.   -------------------------------------------------------------------   -------------------------------------------------------------------  Left ventricle:  Global longitudinal strain is -17.7%   The cavity size was normal. Wall thickness was increased in a  pattern of mild LVH. Systolic function was normal. The estimated  ejection fraction was in the range of 50% to 55%. Wall motion was  normal; there were no regional wall motion abnormalities. Features  are consistent with a pseudonormal left ventricular filling  pattern, with concomitant abnormal relaxation and increased filling  pressure (grade 2 diastolic  dysfunction).   -------------------------------------------------------------------  Aortic valve:   Structurally normal valve.   Cusp separation was  normal.  Doppler:  Transvalvular velocity was within the normal  range. There was no stenosis. There was no regurgitation.   -------------------------------------------------------------------  Aorta:  Aortic root: The aortic root was normal in size.  Ascending aorta: The ascending aorta was normal in size.   -------------------------------------------------------------------  Mitral valve:   Structurally normal valve.   Leaflet separation was  normal.  Doppler:  Transvalvular velocity was within the normal  range. There was no evidence for stenosis. There was no  regurgitation.    Peak gradient (D): 3 mm Hg.   -------------------------------------------------------------------  Left atrium:  The atrium was normal in size.   -------------------------------------------------------------------  Right ventricle:  The cavity size was normal. Systolic function was  normal.   -------------------------------------------------------------------  Pulmonic valve:    The valve appears to be grossly normal.  Doppler:  There was no significant regurgitation.   -------------------------------------------------------------------  Tricuspid valve:   Structurally normal valve.   Leaflet separation  was normal.  Doppler:  Transvalvular velocity was within the normal  range. There was no regurgitation.   -------------------------------------------------------------------  Right atrium:  The atrium was normal in  size.   -------------------------------------------------------------------  Pericardium:  There was no pericardial effusion.   EKG:  EKG is not ordered today.  We are working on getting EKG from [facility]: 10/17/2021: BUN 31; Creatinine, Ser 11.50; Hemoglobin 13.3; Potassium 3.4; Sodium 137  Recent Lipid Panel     Component Value Date/Time   CHOL 160 03/18/2020 1019   TRIG 136 03/18/2020 1019   HDL 29 (L) 03/18/2020 1019   CHOLHDL 5.5 (H) 03/18/2020 1019   CHOLHDL 4.9 04/14/2015 0943   VLDL 22 04/14/2015 0943   LDLCALC 106 (H) 03/18/2020 1019   LDLDIRECT 198 (H) 05/29/2012 1744    Home Medications   Current Meds  Medication Sig   amLODipine (NORVASC) 5 MG tablet Take 1 tablet (5 mg total) by mouth daily.   aspirin EC 81 MG tablet Take 1 tablet (81 mg total) by mouth daily.   Blood Pressure Monitor DEVI Use as directed to check home blood pressure 2-3 times a week   cholecalciferol (VITAMIN D3) 25 MCG (1000 UNIT) tablet Take 1,000 Units by mouth daily.   furosemide (LASIX) 40 MG tablet Take 1 tablet (40 mg total) by mouth daily.   metoprolol tartrate (LOPRESSOR) 25 MG tablet Take 1 tablet (25 mg total) by mouth 2 (two) times daily.   nitroGLYCERIN (NITROSTAT) 0.4 MG SL tablet Place 1 tablet (0.4 mg total) under the tongue every 5 (five) minutes as needed for chest pain.   sucroferric oxyhydroxide (VELPHORO) 500 MG chewable tablet Chew 500-1,500 mg by mouth See admin instructions. Take 1500 mg with each meal and 500 mg with each snack   [DISCONTINUED] amLODipine (NORVASC) 10 MG tablet Take 1 tablet by mouth once daily     Review of Systems      All other systems reviewed and are otherwise negative except as noted above.  Physical Exam    VS:  BP 110/68   Pulse 63   Ht 5\' 10"  (1.778 m)   Wt 227 lb 12.8 oz (103.3 kg)   SpO2 98%   BMI 32.69 kg/m  , BMI Body mass index is 32.69 kg/m.  Wt Readings from Last 3 Encounters:  11/16/21 227 lb 12.8 oz (103.3 kg)  10/17/21 230 lb (104.3 kg)  04/03/21 224 lb 6.4 oz (101.8 kg)     GEN: Well nourished, well developed, in no acute distress. HEENT: normal. Neck: Supple, no JVD, carotid bruits, or masses. Cardiac: RRR, no murmurs, rubs, or gallops. No clubbing, cyanosis, edema.  Radials/PT 2+ and equal bilaterally.  Respiratory:  Respirations  regular and unlabored, clear to auscultation bilaterally. GI: Soft, nontender, nondistended. MS: No deformity or atrophy. Skin: Warm and dry, no rash. Neuro:  Strength and sensation are intact. Psych: Normal affect.  Assessment & Plan    Coronary artery disease without angina/CABG x4 in 2015 -He is no longer taking his statin -We will order a Lipid panel and discuss starting another medication if needed -continue Norvasc 5 mg daily, ASA 81mg , Lasix 40mg  daily (non dialysis days), Lopressor 25mg  BID, and nitro as needed  Essential hypertension -well controlled today -Reduce Norvasc to 5 mg daily, new prescription provided  Dyslipidemia -diet controlled, not on Lipitor  -Lipid panel today since he is fasting  ESRD on HD, renal transplant candidate -stable on dialysis -Lipitor discontinued by Nephrology        Disposition: Follow up 1 year with Lauree Chandler, MD or APP.  Signed, Elgie Collard, PA-C 11/16/2021, 9:49 AM [place]

## 2021-11-15 DIAGNOSIS — N186 End stage renal disease: Secondary | ICD-10-CM | POA: Diagnosis not present

## 2021-11-15 DIAGNOSIS — N2581 Secondary hyperparathyroidism of renal origin: Secondary | ICD-10-CM | POA: Diagnosis not present

## 2021-11-15 DIAGNOSIS — Z992 Dependence on renal dialysis: Secondary | ICD-10-CM | POA: Diagnosis not present

## 2021-11-16 ENCOUNTER — Ambulatory Visit: Payer: Medicare HMO | Attending: Physician Assistant | Admitting: Physician Assistant

## 2021-11-16 ENCOUNTER — Encounter: Payer: Self-pay | Admitting: Physician Assistant

## 2021-11-16 VITALS — BP 110/68 | HR 63 | Ht 70.0 in | Wt 227.8 lb

## 2021-11-16 DIAGNOSIS — I10 Essential (primary) hypertension: Secondary | ICD-10-CM | POA: Diagnosis not present

## 2021-11-16 DIAGNOSIS — Z951 Presence of aortocoronary bypass graft: Secondary | ICD-10-CM | POA: Diagnosis not present

## 2021-11-16 DIAGNOSIS — N184 Chronic kidney disease, stage 4 (severe): Secondary | ICD-10-CM

## 2021-11-16 DIAGNOSIS — N183 Chronic kidney disease, stage 3 unspecified: Secondary | ICD-10-CM | POA: Diagnosis not present

## 2021-11-16 DIAGNOSIS — I25118 Atherosclerotic heart disease of native coronary artery with other forms of angina pectoris: Secondary | ICD-10-CM | POA: Diagnosis not present

## 2021-11-16 LAB — LIPID PANEL
Chol/HDL Ratio: 4.3 ratio (ref 0.0–5.0)
Cholesterol, Total: 125 mg/dL (ref 100–199)
HDL: 29 mg/dL — ABNORMAL LOW (ref >39)
LDL Chol Calc (NIH): 74 mg/dL (ref 0–99)
Triglycerides: 122 mg/dL (ref 0–149)
VLDL Cholesterol Cal: 22 mg/dL (ref 5–40)

## 2021-11-16 MED ORDER — AMLODIPINE BESYLATE 5 MG PO TABS: 5.0000 mg | ORAL_TABLET | Freq: Every day | ORAL | 3 refills | Status: DC

## 2021-11-16 NOTE — Patient Instructions (Signed)
Medication Instructions:  Your physician recommends that you continue on your current medications as directed. Please refer to the Current Medication list given to you today.  *If you need a refill on your cardiac medications before your next appointment, please call your pharmacy*  Lab Work: Lipid panel today If you have labs (blood work) drawn today and your tests are completely normal, you will receive your results only by: [place] (if you have MyChart) OR A paper copy in the mail If you have any lab test that is abnormal or we need to change your treatment, we will call you to review the results.  Follow-Up: At [facility], you and your health needs are our priority.  As part of our continuing mission to provide you with exceptional heart care, we have created designated Provider Care Teams.  These Care Teams include your primary Cardiologist (physician) and Advanced Practice Providers (APPs -  Physician Assistants and Nurse Practitioners) who all work together to provide you with the care you need, when you need it.   Your next appointment:   1 year(s)  The format for your next appointment:   In Person  Provider:   Lauree Chandler, MD    Important Information About Sugar

## 2021-11-17 DIAGNOSIS — Z992 Dependence on renal dialysis: Secondary | ICD-10-CM | POA: Diagnosis not present

## 2021-11-17 DIAGNOSIS — N2581 Secondary hyperparathyroidism of renal origin: Secondary | ICD-10-CM | POA: Diagnosis not present

## 2021-11-17 DIAGNOSIS — N186 End stage renal disease: Secondary | ICD-10-CM | POA: Diagnosis not present

## 2021-11-18 DIAGNOSIS — Z992 Dependence on renal dialysis: Secondary | ICD-10-CM | POA: Diagnosis not present

## 2021-11-18 DIAGNOSIS — N186 End stage renal disease: Secondary | ICD-10-CM | POA: Diagnosis not present

## 2021-11-18 DIAGNOSIS — I129 Hypertensive chronic kidney disease with stage 1 through stage 4 chronic kidney disease, or unspecified chronic kidney disease: Secondary | ICD-10-CM | POA: Diagnosis not present

## 2021-11-20 DIAGNOSIS — N186 End stage renal disease: Secondary | ICD-10-CM | POA: Diagnosis not present

## 2021-11-20 DIAGNOSIS — N2581 Secondary hyperparathyroidism of renal origin: Secondary | ICD-10-CM | POA: Diagnosis not present

## 2021-11-20 DIAGNOSIS — Z992 Dependence on renal dialysis: Secondary | ICD-10-CM | POA: Diagnosis not present

## 2021-11-22 DIAGNOSIS — Z992 Dependence on renal dialysis: Secondary | ICD-10-CM | POA: Diagnosis not present

## 2021-11-22 DIAGNOSIS — N2581 Secondary hyperparathyroidism of renal origin: Secondary | ICD-10-CM | POA: Diagnosis not present

## 2021-11-22 DIAGNOSIS — N186 End stage renal disease: Secondary | ICD-10-CM | POA: Diagnosis not present

## 2021-11-24 DIAGNOSIS — Z992 Dependence on renal dialysis: Secondary | ICD-10-CM | POA: Diagnosis not present

## 2021-11-24 DIAGNOSIS — N186 End stage renal disease: Secondary | ICD-10-CM | POA: Diagnosis not present

## 2021-11-24 DIAGNOSIS — N2581 Secondary hyperparathyroidism of renal origin: Secondary | ICD-10-CM | POA: Diagnosis not present

## 2021-11-27 DIAGNOSIS — N186 End stage renal disease: Secondary | ICD-10-CM | POA: Diagnosis not present

## 2021-11-27 DIAGNOSIS — N2581 Secondary hyperparathyroidism of renal origin: Secondary | ICD-10-CM | POA: Diagnosis not present

## 2021-11-27 DIAGNOSIS — Z992 Dependence on renal dialysis: Secondary | ICD-10-CM | POA: Diagnosis not present

## 2021-11-29 DIAGNOSIS — Z992 Dependence on renal dialysis: Secondary | ICD-10-CM | POA: Diagnosis not present

## 2021-11-29 DIAGNOSIS — N2581 Secondary hyperparathyroidism of renal origin: Secondary | ICD-10-CM | POA: Diagnosis not present

## 2021-11-29 DIAGNOSIS — N186 End stage renal disease: Secondary | ICD-10-CM | POA: Diagnosis not present

## 2021-12-01 DIAGNOSIS — N2581 Secondary hyperparathyroidism of renal origin: Secondary | ICD-10-CM | POA: Diagnosis not present

## 2021-12-01 DIAGNOSIS — N186 End stage renal disease: Secondary | ICD-10-CM | POA: Diagnosis not present

## 2021-12-01 DIAGNOSIS — Z992 Dependence on renal dialysis: Secondary | ICD-10-CM | POA: Diagnosis not present

## 2021-12-04 DIAGNOSIS — N2581 Secondary hyperparathyroidism of renal origin: Secondary | ICD-10-CM | POA: Diagnosis not present

## 2021-12-04 DIAGNOSIS — Z992 Dependence on renal dialysis: Secondary | ICD-10-CM | POA: Diagnosis not present

## 2021-12-04 DIAGNOSIS — N186 End stage renal disease: Secondary | ICD-10-CM | POA: Diagnosis not present

## 2021-12-06 DIAGNOSIS — N186 End stage renal disease: Secondary | ICD-10-CM | POA: Diagnosis not present

## 2021-12-06 DIAGNOSIS — N2581 Secondary hyperparathyroidism of renal origin: Secondary | ICD-10-CM | POA: Diagnosis not present

## 2021-12-06 DIAGNOSIS — Z992 Dependence on renal dialysis: Secondary | ICD-10-CM | POA: Diagnosis not present

## 2021-12-07 DIAGNOSIS — I871 Compression of vein: Secondary | ICD-10-CM | POA: Diagnosis not present

## 2021-12-07 DIAGNOSIS — N186 End stage renal disease: Secondary | ICD-10-CM | POA: Diagnosis not present

## 2021-12-07 DIAGNOSIS — T82858A Stenosis of vascular prosthetic devices, implants and grafts, initial encounter: Secondary | ICD-10-CM | POA: Diagnosis not present

## 2021-12-07 DIAGNOSIS — Z992 Dependence on renal dialysis: Secondary | ICD-10-CM | POA: Diagnosis not present

## 2021-12-08 DIAGNOSIS — Z992 Dependence on renal dialysis: Secondary | ICD-10-CM | POA: Diagnosis not present

## 2021-12-08 DIAGNOSIS — N2581 Secondary hyperparathyroidism of renal origin: Secondary | ICD-10-CM | POA: Diagnosis not present

## 2021-12-08 DIAGNOSIS — N186 End stage renal disease: Secondary | ICD-10-CM | POA: Diagnosis not present

## 2021-12-11 DIAGNOSIS — N186 End stage renal disease: Secondary | ICD-10-CM | POA: Diagnosis not present

## 2021-12-11 DIAGNOSIS — N2581 Secondary hyperparathyroidism of renal origin: Secondary | ICD-10-CM | POA: Diagnosis not present

## 2021-12-11 DIAGNOSIS — Z992 Dependence on renal dialysis: Secondary | ICD-10-CM | POA: Diagnosis not present

## 2021-12-13 DIAGNOSIS — N2581 Secondary hyperparathyroidism of renal origin: Secondary | ICD-10-CM | POA: Diagnosis not present

## 2021-12-13 DIAGNOSIS — Z992 Dependence on renal dialysis: Secondary | ICD-10-CM | POA: Diagnosis not present

## 2021-12-13 DIAGNOSIS — N186 End stage renal disease: Secondary | ICD-10-CM | POA: Diagnosis not present

## 2021-12-15 DIAGNOSIS — N2581 Secondary hyperparathyroidism of renal origin: Secondary | ICD-10-CM | POA: Diagnosis not present

## 2021-12-15 DIAGNOSIS — Z992 Dependence on renal dialysis: Secondary | ICD-10-CM | POA: Diagnosis not present

## 2021-12-15 DIAGNOSIS — N186 End stage renal disease: Secondary | ICD-10-CM | POA: Diagnosis not present

## 2021-12-18 DIAGNOSIS — N2581 Secondary hyperparathyroidism of renal origin: Secondary | ICD-10-CM | POA: Diagnosis not present

## 2021-12-18 DIAGNOSIS — N186 End stage renal disease: Secondary | ICD-10-CM | POA: Diagnosis not present

## 2021-12-18 DIAGNOSIS — Z992 Dependence on renal dialysis: Secondary | ICD-10-CM | POA: Diagnosis not present

## 2021-12-19 DIAGNOSIS — N186 End stage renal disease: Secondary | ICD-10-CM | POA: Diagnosis not present

## 2021-12-19 DIAGNOSIS — I129 Hypertensive chronic kidney disease with stage 1 through stage 4 chronic kidney disease, or unspecified chronic kidney disease: Secondary | ICD-10-CM | POA: Diagnosis not present

## 2021-12-19 DIAGNOSIS — Z992 Dependence on renal dialysis: Secondary | ICD-10-CM | POA: Diagnosis not present

## 2021-12-20 DIAGNOSIS — Z992 Dependence on renal dialysis: Secondary | ICD-10-CM | POA: Diagnosis not present

## 2021-12-20 DIAGNOSIS — N2581 Secondary hyperparathyroidism of renal origin: Secondary | ICD-10-CM | POA: Diagnosis not present

## 2021-12-20 DIAGNOSIS — N186 End stage renal disease: Secondary | ICD-10-CM | POA: Diagnosis not present

## 2021-12-22 DIAGNOSIS — Z992 Dependence on renal dialysis: Secondary | ICD-10-CM | POA: Diagnosis not present

## 2021-12-22 DIAGNOSIS — N2581 Secondary hyperparathyroidism of renal origin: Secondary | ICD-10-CM | POA: Diagnosis not present

## 2021-12-22 DIAGNOSIS — N186 End stage renal disease: Secondary | ICD-10-CM | POA: Diagnosis not present

## 2021-12-25 DIAGNOSIS — N2581 Secondary hyperparathyroidism of renal origin: Secondary | ICD-10-CM | POA: Diagnosis not present

## 2021-12-25 DIAGNOSIS — N186 End stage renal disease: Secondary | ICD-10-CM | POA: Diagnosis not present

## 2021-12-25 DIAGNOSIS — Z992 Dependence on renal dialysis: Secondary | ICD-10-CM | POA: Diagnosis not present

## 2021-12-27 DIAGNOSIS — N2581 Secondary hyperparathyroidism of renal origin: Secondary | ICD-10-CM | POA: Diagnosis not present

## 2021-12-27 DIAGNOSIS — N186 End stage renal disease: Secondary | ICD-10-CM | POA: Diagnosis not present

## 2021-12-27 DIAGNOSIS — Z992 Dependence on renal dialysis: Secondary | ICD-10-CM | POA: Diagnosis not present

## 2021-12-29 DIAGNOSIS — N186 End stage renal disease: Secondary | ICD-10-CM | POA: Diagnosis not present

## 2021-12-29 DIAGNOSIS — N2581 Secondary hyperparathyroidism of renal origin: Secondary | ICD-10-CM | POA: Diagnosis not present

## 2021-12-29 DIAGNOSIS — Z992 Dependence on renal dialysis: Secondary | ICD-10-CM | POA: Diagnosis not present

## 2022-01-01 DIAGNOSIS — N2581 Secondary hyperparathyroidism of renal origin: Secondary | ICD-10-CM | POA: Diagnosis not present

## 2022-01-01 DIAGNOSIS — Z992 Dependence on renal dialysis: Secondary | ICD-10-CM | POA: Diagnosis not present

## 2022-01-01 DIAGNOSIS — N186 End stage renal disease: Secondary | ICD-10-CM | POA: Diagnosis not present

## 2022-01-03 DIAGNOSIS — Z992 Dependence on renal dialysis: Secondary | ICD-10-CM | POA: Diagnosis not present

## 2022-01-03 DIAGNOSIS — N2581 Secondary hyperparathyroidism of renal origin: Secondary | ICD-10-CM | POA: Diagnosis not present

## 2022-01-03 DIAGNOSIS — N186 End stage renal disease: Secondary | ICD-10-CM | POA: Diagnosis not present

## 2022-01-05 DIAGNOSIS — N186 End stage renal disease: Secondary | ICD-10-CM | POA: Diagnosis not present

## 2022-01-05 DIAGNOSIS — Z992 Dependence on renal dialysis: Secondary | ICD-10-CM | POA: Diagnosis not present

## 2022-01-05 DIAGNOSIS — N2581 Secondary hyperparathyroidism of renal origin: Secondary | ICD-10-CM | POA: Diagnosis not present

## 2022-01-08 DIAGNOSIS — N2581 Secondary hyperparathyroidism of renal origin: Secondary | ICD-10-CM | POA: Diagnosis not present

## 2022-01-08 DIAGNOSIS — Z992 Dependence on renal dialysis: Secondary | ICD-10-CM | POA: Diagnosis not present

## 2022-01-08 DIAGNOSIS — N186 End stage renal disease: Secondary | ICD-10-CM | POA: Diagnosis not present

## 2022-01-09 DIAGNOSIS — N2581 Secondary hyperparathyroidism of renal origin: Secondary | ICD-10-CM | POA: Diagnosis not present

## 2022-01-09 DIAGNOSIS — N186 End stage renal disease: Secondary | ICD-10-CM | POA: Diagnosis not present

## 2022-01-09 DIAGNOSIS — Z992 Dependence on renal dialysis: Secondary | ICD-10-CM | POA: Diagnosis not present

## 2022-01-12 DIAGNOSIS — I2510 Atherosclerotic heart disease of native coronary artery without angina pectoris: Secondary | ICD-10-CM | POA: Diagnosis not present

## 2022-01-12 DIAGNOSIS — Z951 Presence of aortocoronary bypass graft: Secondary | ICD-10-CM | POA: Diagnosis not present

## 2022-01-12 DIAGNOSIS — I25810 Atherosclerosis of coronary artery bypass graft(s) without angina pectoris: Secondary | ICD-10-CM | POA: Diagnosis not present

## 2022-01-12 DIAGNOSIS — I25110 Atherosclerotic heart disease of native coronary artery with unstable angina pectoris: Secondary | ICD-10-CM | POA: Diagnosis not present

## 2022-01-12 DIAGNOSIS — R9439 Abnormal result of other cardiovascular function study: Secondary | ICD-10-CM | POA: Diagnosis not present

## 2022-01-12 DIAGNOSIS — N186 End stage renal disease: Secondary | ICD-10-CM | POA: Diagnosis not present

## 2022-01-12 DIAGNOSIS — I252 Old myocardial infarction: Secondary | ICD-10-CM | POA: Diagnosis not present

## 2022-01-12 DIAGNOSIS — I2582 Chronic total occlusion of coronary artery: Secondary | ICD-10-CM | POA: Diagnosis not present

## 2022-01-12 DIAGNOSIS — I120 Hypertensive chronic kidney disease with stage 5 chronic kidney disease or end stage renal disease: Secondary | ICD-10-CM | POA: Diagnosis not present

## 2022-01-12 DIAGNOSIS — I2584 Coronary atherosclerosis due to calcified coronary lesion: Secondary | ICD-10-CM | POA: Diagnosis not present

## 2022-01-12 DIAGNOSIS — E1122 Type 2 diabetes mellitus with diabetic chronic kidney disease: Secondary | ICD-10-CM | POA: Diagnosis not present

## 2022-01-12 DIAGNOSIS — I25790 Atherosclerosis of other coronary artery bypass graft(s) with unstable angina pectoris: Secondary | ICD-10-CM | POA: Diagnosis not present

## 2022-01-12 DIAGNOSIS — I444 Left anterior fascicular block: Secondary | ICD-10-CM | POA: Diagnosis not present

## 2022-01-12 DIAGNOSIS — R001 Bradycardia, unspecified: Secondary | ICD-10-CM | POA: Diagnosis not present

## 2022-01-13 DIAGNOSIS — Z992 Dependence on renal dialysis: Secondary | ICD-10-CM | POA: Diagnosis not present

## 2022-01-13 DIAGNOSIS — N186 End stage renal disease: Secondary | ICD-10-CM | POA: Diagnosis not present

## 2022-01-13 DIAGNOSIS — N2581 Secondary hyperparathyroidism of renal origin: Secondary | ICD-10-CM | POA: Diagnosis not present

## 2022-01-15 DIAGNOSIS — Z992 Dependence on renal dialysis: Secondary | ICD-10-CM | POA: Diagnosis not present

## 2022-01-15 DIAGNOSIS — N2581 Secondary hyperparathyroidism of renal origin: Secondary | ICD-10-CM | POA: Diagnosis not present

## 2022-01-15 DIAGNOSIS — N186 End stage renal disease: Secondary | ICD-10-CM | POA: Diagnosis not present

## 2022-01-17 DIAGNOSIS — N2581 Secondary hyperparathyroidism of renal origin: Secondary | ICD-10-CM | POA: Diagnosis not present

## 2022-01-17 DIAGNOSIS — Z992 Dependence on renal dialysis: Secondary | ICD-10-CM | POA: Diagnosis not present

## 2022-01-17 DIAGNOSIS — N186 End stage renal disease: Secondary | ICD-10-CM | POA: Diagnosis not present

## 2022-01-18 DIAGNOSIS — N186 End stage renal disease: Secondary | ICD-10-CM | POA: Diagnosis not present

## 2022-01-18 DIAGNOSIS — I129 Hypertensive chronic kidney disease with stage 1 through stage 4 chronic kidney disease, or unspecified chronic kidney disease: Secondary | ICD-10-CM | POA: Diagnosis not present

## 2022-01-18 DIAGNOSIS — Z992 Dependence on renal dialysis: Secondary | ICD-10-CM | POA: Diagnosis not present

## 2022-01-19 DIAGNOSIS — N186 End stage renal disease: Secondary | ICD-10-CM | POA: Diagnosis not present

## 2022-01-19 DIAGNOSIS — Z992 Dependence on renal dialysis: Secondary | ICD-10-CM | POA: Diagnosis not present

## 2022-01-19 DIAGNOSIS — N2581 Secondary hyperparathyroidism of renal origin: Secondary | ICD-10-CM | POA: Diagnosis not present

## 2022-01-22 DIAGNOSIS — Z992 Dependence on renal dialysis: Secondary | ICD-10-CM | POA: Diagnosis not present

## 2022-01-22 DIAGNOSIS — N2581 Secondary hyperparathyroidism of renal origin: Secondary | ICD-10-CM | POA: Diagnosis not present

## 2022-01-22 DIAGNOSIS — N186 End stage renal disease: Secondary | ICD-10-CM | POA: Diagnosis not present

## 2022-01-24 DIAGNOSIS — N186 End stage renal disease: Secondary | ICD-10-CM | POA: Diagnosis not present

## 2022-01-24 DIAGNOSIS — N2581 Secondary hyperparathyroidism of renal origin: Secondary | ICD-10-CM | POA: Diagnosis not present

## 2022-01-24 DIAGNOSIS — Z992 Dependence on renal dialysis: Secondary | ICD-10-CM | POA: Diagnosis not present

## 2022-01-26 DIAGNOSIS — N186 End stage renal disease: Secondary | ICD-10-CM | POA: Diagnosis not present

## 2022-01-26 DIAGNOSIS — Z992 Dependence on renal dialysis: Secondary | ICD-10-CM | POA: Diagnosis not present

## 2022-01-26 DIAGNOSIS — N2581 Secondary hyperparathyroidism of renal origin: Secondary | ICD-10-CM | POA: Diagnosis not present

## 2022-01-29 DIAGNOSIS — Z992 Dependence on renal dialysis: Secondary | ICD-10-CM | POA: Diagnosis not present

## 2022-01-29 DIAGNOSIS — N186 End stage renal disease: Secondary | ICD-10-CM | POA: Diagnosis not present

## 2022-01-29 DIAGNOSIS — N2581 Secondary hyperparathyroidism of renal origin: Secondary | ICD-10-CM | POA: Diagnosis not present

## 2022-01-31 DIAGNOSIS — Z992 Dependence on renal dialysis: Secondary | ICD-10-CM | POA: Diagnosis not present

## 2022-01-31 DIAGNOSIS — N2581 Secondary hyperparathyroidism of renal origin: Secondary | ICD-10-CM | POA: Diagnosis not present

## 2022-01-31 DIAGNOSIS — N186 End stage renal disease: Secondary | ICD-10-CM | POA: Diagnosis not present

## 2022-02-02 DIAGNOSIS — N2581 Secondary hyperparathyroidism of renal origin: Secondary | ICD-10-CM | POA: Diagnosis not present

## 2022-02-02 DIAGNOSIS — N186 End stage renal disease: Secondary | ICD-10-CM | POA: Diagnosis not present

## 2022-02-02 DIAGNOSIS — Z992 Dependence on renal dialysis: Secondary | ICD-10-CM | POA: Diagnosis not present

## 2022-02-05 ENCOUNTER — Telehealth (HOSPITAL_COMMUNITY): Payer: Self-pay

## 2022-02-05 DIAGNOSIS — N2581 Secondary hyperparathyroidism of renal origin: Secondary | ICD-10-CM | POA: Diagnosis not present

## 2022-02-05 DIAGNOSIS — Z992 Dependence on renal dialysis: Secondary | ICD-10-CM | POA: Diagnosis not present

## 2022-02-05 DIAGNOSIS — N186 End stage renal disease: Secondary | ICD-10-CM | POA: Diagnosis not present

## 2022-02-05 NOTE — Telephone Encounter (Signed)
Outside/paper referral received by Dr. Philbert Riser from [facility]. Will fax over Physician order and request further documents. Insurance benefits and eligibility to be determined.

## 2022-02-05 NOTE — Telephone Encounter (Signed)
Called and spoke with pt in regards to CR, pt stated he is not able to participate at this time due to his schedule.   Closed referral

## 2022-02-07 DIAGNOSIS — N2581 Secondary hyperparathyroidism of renal origin: Secondary | ICD-10-CM | POA: Diagnosis not present

## 2022-02-07 DIAGNOSIS — N186 End stage renal disease: Secondary | ICD-10-CM | POA: Diagnosis not present

## 2022-02-07 DIAGNOSIS — Z992 Dependence on renal dialysis: Secondary | ICD-10-CM | POA: Diagnosis not present

## 2022-02-09 DIAGNOSIS — Z992 Dependence on renal dialysis: Secondary | ICD-10-CM | POA: Diagnosis not present

## 2022-02-09 DIAGNOSIS — N186 End stage renal disease: Secondary | ICD-10-CM | POA: Diagnosis not present

## 2022-02-09 DIAGNOSIS — N2581 Secondary hyperparathyroidism of renal origin: Secondary | ICD-10-CM | POA: Diagnosis not present

## 2022-02-11 DIAGNOSIS — Z992 Dependence on renal dialysis: Secondary | ICD-10-CM | POA: Diagnosis not present

## 2022-02-11 DIAGNOSIS — N186 End stage renal disease: Secondary | ICD-10-CM | POA: Diagnosis not present

## 2022-02-11 DIAGNOSIS — N2581 Secondary hyperparathyroidism of renal origin: Secondary | ICD-10-CM | POA: Diagnosis not present

## 2022-02-14 DIAGNOSIS — N186 End stage renal disease: Secondary | ICD-10-CM | POA: Diagnosis not present

## 2022-02-14 DIAGNOSIS — Z992 Dependence on renal dialysis: Secondary | ICD-10-CM | POA: Diagnosis not present

## 2022-02-14 DIAGNOSIS — N2581 Secondary hyperparathyroidism of renal origin: Secondary | ICD-10-CM | POA: Diagnosis not present

## 2022-02-16 DIAGNOSIS — Z992 Dependence on renal dialysis: Secondary | ICD-10-CM | POA: Diagnosis not present

## 2022-02-16 DIAGNOSIS — N2581 Secondary hyperparathyroidism of renal origin: Secondary | ICD-10-CM | POA: Diagnosis not present

## 2022-02-16 DIAGNOSIS — N186 End stage renal disease: Secondary | ICD-10-CM | POA: Diagnosis not present

## 2022-02-18 DIAGNOSIS — N186 End stage renal disease: Secondary | ICD-10-CM | POA: Diagnosis not present

## 2022-02-18 DIAGNOSIS — N2581 Secondary hyperparathyroidism of renal origin: Secondary | ICD-10-CM | POA: Diagnosis not present

## 2022-02-18 DIAGNOSIS — Z992 Dependence on renal dialysis: Secondary | ICD-10-CM | POA: Diagnosis not present

## 2022-02-18 DIAGNOSIS — I129 Hypertensive chronic kidney disease with stage 1 through stage 4 chronic kidney disease, or unspecified chronic kidney disease: Secondary | ICD-10-CM | POA: Diagnosis not present

## 2022-02-21 DIAGNOSIS — N2581 Secondary hyperparathyroidism of renal origin: Secondary | ICD-10-CM | POA: Diagnosis not present

## 2022-02-21 DIAGNOSIS — N186 End stage renal disease: Secondary | ICD-10-CM | POA: Diagnosis not present

## 2022-02-21 DIAGNOSIS — Z992 Dependence on renal dialysis: Secondary | ICD-10-CM | POA: Diagnosis not present

## 2022-02-23 ENCOUNTER — Inpatient Hospital Stay: Payer: Medicare HMO | Admitting: Internal Medicine

## 2022-02-23 DIAGNOSIS — N2581 Secondary hyperparathyroidism of renal origin: Secondary | ICD-10-CM | POA: Diagnosis not present

## 2022-02-23 DIAGNOSIS — Z992 Dependence on renal dialysis: Secondary | ICD-10-CM | POA: Diagnosis not present

## 2022-02-23 DIAGNOSIS — N186 End stage renal disease: Secondary | ICD-10-CM | POA: Diagnosis not present

## 2022-02-26 DIAGNOSIS — N2581 Secondary hyperparathyroidism of renal origin: Secondary | ICD-10-CM | POA: Diagnosis not present

## 2022-02-26 DIAGNOSIS — N186 End stage renal disease: Secondary | ICD-10-CM | POA: Diagnosis not present

## 2022-02-26 DIAGNOSIS — Z992 Dependence on renal dialysis: Secondary | ICD-10-CM | POA: Diagnosis not present

## 2022-02-27 ENCOUNTER — Ambulatory Visit: Payer: Medicare HMO | Attending: Internal Medicine | Admitting: Internal Medicine

## 2022-02-27 ENCOUNTER — Encounter: Payer: Self-pay | Admitting: Internal Medicine

## 2022-02-27 VITALS — BP 108/71 | HR 63 | Temp 98.5°F | Ht 70.0 in | Wt 239.0 lb

## 2022-02-27 DIAGNOSIS — I10 Essential (primary) hypertension: Secondary | ICD-10-CM

## 2022-02-27 DIAGNOSIS — I5042 Chronic combined systolic (congestive) and diastolic (congestive) heart failure: Secondary | ICD-10-CM | POA: Diagnosis not present

## 2022-02-27 DIAGNOSIS — Z992 Dependence on renal dialysis: Secondary | ICD-10-CM

## 2022-02-27 DIAGNOSIS — I2510 Atherosclerotic heart disease of native coronary artery without angina pectoris: Secondary | ICD-10-CM

## 2022-02-27 DIAGNOSIS — Z2821 Immunization not carried out because of patient refusal: Secondary | ICD-10-CM | POA: Diagnosis not present

## 2022-02-27 DIAGNOSIS — N186 End stage renal disease: Secondary | ICD-10-CM | POA: Diagnosis not present

## 2022-02-27 MED ORDER — CLOPIDOGREL BISULFATE 75 MG PO TABS: 75.0000 mg | ORAL_TABLET | Freq: Every day | ORAL | 1 refills | Status: DC

## 2022-02-27 NOTE — Progress Notes (Signed)
Patient ID: Seth Keller, male    DOB: Nov 21, 1965  MRN: [id number]  CC: Hospitalization Follow-up (Procedure f/u. /Med refills. Discuss Clopidigril. /No to flu vax. )   Subjective: Seth Keller is a 57 y.o. male who presents for chronic ds management.  Magda Kiel, his fiance is with him His concerns today include:  Pt with hx of CAD s/p CABG x 4 (Dr. 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 Justin Mend), HTN, HL, ICM but last EF 08/2016 improved to 50-55%, 2DD,  obesity.   CAD/HTN/combined CHF: Since last visit, he reports having had a stent placed in one of his coronary arteries at [facility] 12/2021. -Placed on Plavix.  Continued on aspirin and metoprolol 25 mg twice a day.  On Lipitor 80 mg daily.  No muscle cramps with the medication. He reports that Norvasc and isosorbide were discontinued sometime back. -No chest pains or increased shortness of breath since stent placement.  No use of sublingual nitroglycerin.  ESRD: Still on dialysis q. Monday Wednesday and Fridays.  Now on the transplant list through [facility] transplant clinic.  HM: He declines flu shot, shingles vaccine and COVID booster.  He has had 2 COVID vaccines and does not want to get any more. Patient Active Problem List   Diagnosis Date Noted   ESRD on hemodialysis ([place]) 11/28/2020   Influenza vaccine refused 03/18/2020   Tachy-brady syndrome ([place]) 07/06/2019   Bradycardia 09/25/2018   Colon polyps 08/14/2018   Anemia of chronic renal failure 06/04/2018   Secondary hyperparathyroidism of renal origin ([place]) 06/04/2018   Chronic bilateral low back pain without sciatica 12/22/2015   Obesity (BMI 30-39.9) 06/16/2015   SOB (shortness of breath) 09/27/2014   Left shoulder pain 09/08/2014   Coronary artery disease of bypass graft of native heart with stable angina pectoris ([place])    Ischemic cardiomyopathy    Chronic combined systolic and diastolic CHF (congestive heart failure) ([place])    Tobacco abuse, in  remission    Constipation 01/11/2014   S/P CABG x 4 01/01/2014   Essential hypertension 05/08/2012   Microhematuria 05/08/2012   Proteinuria 05/08/2012     Current Outpatient Medications on File Prior to Visit  Medication Sig Dispense Refill   amLODipine (NORVASC) 5 MG tablet Take 1 tablet (5 mg total) by mouth daily. 90 tablet 3   aspirin EC 81 MG tablet Take 1 tablet (81 mg total) by mouth daily. 90 tablet 3   Blood Pressure Monitor DEVI Use as directed to check home blood pressure 2-3 times a week 1 Device 0   cholecalciferol (VITAMIN D3) 25 MCG (1000 UNIT) tablet Take 1,000 Units by mouth daily.     furosemide (LASIX) 40 MG tablet Take 1 tablet (40 mg total) by mouth daily. 90 tablet 1   metoprolol tartrate (LOPRESSOR) 25 MG tablet Take 1 tablet (25 mg total) by mouth 2 (two) times daily. 180 tablet 1   nitroGLYCERIN (NITROSTAT) 0.4 MG SL tablet Place 1 tablet (0.4 mg total) under the tongue every 5 (five) minutes as needed for chest pain. 90 tablet 3   sucroferric oxyhydroxide (VELPHORO) 500 MG chewable tablet Chew 500-1,500 mg by mouth See admin instructions. Take 1500 mg with each meal and 500 mg with each snack     No current facility-administered medications on file prior to visit.    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   From [place], originally.   Moved to [place] in 2012.    Social Determinants of Health   Financial Resource Strain: Not on file  Food Insecurity: Not on file  Transportation Needs: Not on file  Physical Activity: Not on file  Stress: Not on file  Social Connections: Not on file  Intimate Partner Violence: Not on file    Family History  Problem Relation Age of Onset   Heart disease Mother 17   Hypertension Father 85   Asthma Father    Diabetes Paternal Grandfather    Cancer Neg Hx     Past Surgical History:  Procedure Laterality Date   AV FISTULA PLACEMENT Right 08/15/2020   Procedure: RIGHT ARM RADIOCEPHALIC ARTERIOVENOUS (AV) FISTULA CREATION;  Surgeon: Cherre Robins, MD;  Location: [place];  Service: Vascular;  Laterality: Right;   BIOPSY  10/17/2021   Procedure: BIOPSY;  Surgeon: Otis Brace, MD;  [facility];  Service: Gastroenterology;;   CARDIAC CATHETERIZATION     COLONOSCOPY WITH PROPOFOL N/A 10/17/2021   Procedure: COLONOSCOPY WITH PROPOFOL;  Surgeon: Otis Brace, MD;  [facility];  Service: Gastroenterology;  Laterality: N/A;   CORONARY ARTERY BYPASS GRAFT N/A 01/01/2014   Procedure: CORONARY ARTERY BYPASS GRAFTING (CABG) times four using left internal mammary artery and right saphenous vein;  Surgeon: Gaye Pollack, MD;  Location: [place] [facility];  Service: Open Heart Surgery;  Laterality: N/A;   INTRAOPERATIVE TRANSESOPHAGEAL ECHOCARDIOGRAM N/A 01/01/2014   Procedure: INTRAOPERATIVE TRANSESOPHAGEAL ECHOCARDIOGRAM;  Surgeon: Gaye Pollack, MD;  Location: [place] [facility];  Service: Open Heart Surgery;  Laterality: N/A;   LEFT HEART CATHETERIZATION WITH CORONARY ANGIOGRAM N/A  12/29/2013   Procedure: LEFT HEART CATHETERIZATION WITH CORONARY ANGIOGRAM;  Surgeon: Sinclair Grooms, MD;  Location: [facility] CATH LAB;  Service: Cardiovascular;  Laterality: N/A;   none     POLYPECTOMY  10/17/2021   Procedure: POLYPECTOMY;  Surgeon: Otis Brace, MD;  [facility];  Service: Gastroenterology;;    ROS: Review of Systems Negative except as stated above  PHYSICAL EXAM: BP 108/71 (BP Location: Left Arm, Patient Position: Sitting, Cuff Size: Large)   Pulse 63   Temp 98.5 F (36.9 C) (Oral)   Ht 5\' 10"  (1.778 m)   Wt 239 lb (108.4 kg)   SpO2 97%   BMI 34.29 kg/m   Physical Exam   General appearance - alert, well appearing, middle-aged African-American male and  in no distress Mental status - normal mood, behavior, speech, dress, motor activity, and thought processes Neck - supple, no significant adenopathy Chest - clear to auscultation, no wheezes, rales or rhonchi, symmetric air entry Heart - normal rate, regular rhythm, normal S1, S2, no murmurs, rubs, clicks or gallops Extremities - peripheral pulses normal, no pedal edema, no clubbing or cyanosis     Latest Ref Rng & Units 10/17/2021    6:50 AM 03/18/2020   10:19 AM 07/06/2019   10:33 AM  CMP  Glucose 70 - 99 mg/dL 92   103   BUN 6 - 20 mg/dL 31   26   Creatinine 0.61 - 1.24 mg/dL 11.50   2.74   Sodium 135 - 145 mmol/L 137   142   Potassium 3.5 - 5.1 mmol/L 3.4  4.8  4.5   Chloride 98 - 111 mmol/L 93   106   CO2 20 - 29 mmol/L   21   Calcium 8.7 - 10.2 mg/dL   9.2   Total Protein 6.0 - 8.5 g/dL  6.9    Total Bilirubin 0.0 - 1.2 mg/dL  0.2    Alkaline Phos 44 - 121 IU/L  108    AST 0 - 40 IU/L  14    ALT 0 - 44 IU/L  24     Lipid Panel     Component Value Date/Time   CHOL 125 11/16/2021 0914   TRIG 122 11/16/2021 0914   HDL 29 (L) 11/16/2021 0914   CHOLHDL 4.3 11/16/2021 0914   CHOLHDL 4.9 04/14/2015 0943   VLDL 22 04/14/2015 0943   LDLCALC 74 11/16/2021 0914   LDLDIRECT 198 (H)  05/29/2012 1744    CBC    Component Value Date/Time   WBC 8.8 03/18/2020 1019   WBC 8.0 08/16/2014 1147   RBC 4.60 03/18/2020 1019   RBC 4.97 08/16/2014 1147   HGB 13.3 10/17/2021 0650   HGB 12.6 (L) 03/18/2020 1019   HCT 39.0 10/17/2021 0650   HCT 38.5 03/18/2020 1019   PLT 289 03/18/2020 1019   MCV 84 03/18/2020 1019   MCH 27.4 03/18/2020 1019   MCH 27.6 08/16/2014 1147   MCHC 32.7 03/18/2020 1019   MCHC 33.5 08/16/2014 1147   RDW 14.5 03/18/2020 1019   LYMPHSABS 3.5 12/31/2013 0315   MONOABS 1.1 (H) 12/31/2013 0315   EOSABS 0.3 12/31/2013 0315   BASOSABS 0.0 12/31/2013 0315    ASSESSMENT AND PLAN: 1. Coronary artery disease involving native coronary artery of native heart without angina pectoris Stable.  Patient had stent placed through [facility] cardiology 12/2021.  Will get him back in with Dr. Angelena Form for follow-up. Continue aspirin, Plavix, metoprolol and atorvastatin. - clopidogrel (PLAVIX) 75 MG tablet; Take 1 tablet (75 mg total) by mouth daily.  Dispense: 90 tablet; Refill: 1 - Ambulatory referral to Cardiology  2. ESRD on hemodialysis [facility]) On transplant list through [facility]  3. Essential hypertension Control.  Continue metoprolol 25 mg twice a day  4. Chronic combined systolic and diastolic CHF (congestive heart failure) (HCC) Continue metoprolol, furosemide  5. Influenza vaccination declined Recommended.  Patient declined.  6. COVID-19 vaccination declined Recommended.  Patient declined.    Patient was given the opportunity to ask questions.  Patient verbalized understanding of the plan and was able to repeat key elements of the plan.   This documentation was completed using Radio producer.  Any transcriptional errors are unintentional.  No orders of  the defined types were placed in this encounter.    Requested Prescriptions    No prescriptions requested or ordered in this encounter    No  follow-ups on file.  Karle Plumber, MD, FACP

## 2022-02-28 DIAGNOSIS — N2581 Secondary hyperparathyroidism of renal origin: Secondary | ICD-10-CM | POA: Diagnosis not present

## 2022-02-28 DIAGNOSIS — N186 End stage renal disease: Secondary | ICD-10-CM | POA: Diagnosis not present

## 2022-02-28 DIAGNOSIS — Z992 Dependence on renal dialysis: Secondary | ICD-10-CM | POA: Diagnosis not present

## 2022-03-02 DIAGNOSIS — Z992 Dependence on renal dialysis: Secondary | ICD-10-CM | POA: Diagnosis not present

## 2022-03-02 DIAGNOSIS — N186 End stage renal disease: Secondary | ICD-10-CM | POA: Diagnosis not present

## 2022-03-02 DIAGNOSIS — N2581 Secondary hyperparathyroidism of renal origin: Secondary | ICD-10-CM | POA: Diagnosis not present

## 2022-03-05 DIAGNOSIS — N186 End stage renal disease: Secondary | ICD-10-CM | POA: Diagnosis not present

## 2022-03-05 DIAGNOSIS — Z992 Dependence on renal dialysis: Secondary | ICD-10-CM | POA: Diagnosis not present

## 2022-03-05 DIAGNOSIS — N2581 Secondary hyperparathyroidism of renal origin: Secondary | ICD-10-CM | POA: Diagnosis not present

## 2022-03-07 DIAGNOSIS — N2581 Secondary hyperparathyroidism of renal origin: Secondary | ICD-10-CM | POA: Diagnosis not present

## 2022-03-07 DIAGNOSIS — Z992 Dependence on renal dialysis: Secondary | ICD-10-CM | POA: Diagnosis not present

## 2022-03-07 DIAGNOSIS — N186 End stage renal disease: Secondary | ICD-10-CM | POA: Diagnosis not present

## 2022-03-09 DIAGNOSIS — N186 End stage renal disease: Secondary | ICD-10-CM | POA: Diagnosis not present

## 2022-03-09 DIAGNOSIS — Z992 Dependence on renal dialysis: Secondary | ICD-10-CM | POA: Diagnosis not present

## 2022-03-09 DIAGNOSIS — N2581 Secondary hyperparathyroidism of renal origin: Secondary | ICD-10-CM | POA: Diagnosis not present

## 2022-03-12 DIAGNOSIS — N186 End stage renal disease: Secondary | ICD-10-CM | POA: Diagnosis not present

## 2022-03-12 DIAGNOSIS — Z992 Dependence on renal dialysis: Secondary | ICD-10-CM | POA: Diagnosis not present

## 2022-03-12 DIAGNOSIS — N2581 Secondary hyperparathyroidism of renal origin: Secondary | ICD-10-CM | POA: Diagnosis not present

## 2022-03-14 DIAGNOSIS — N186 End stage renal disease: Secondary | ICD-10-CM | POA: Diagnosis not present

## 2022-03-14 DIAGNOSIS — N2581 Secondary hyperparathyroidism of renal origin: Secondary | ICD-10-CM | POA: Diagnosis not present

## 2022-03-14 DIAGNOSIS — Z992 Dependence on renal dialysis: Secondary | ICD-10-CM | POA: Diagnosis not present

## 2022-03-16 DIAGNOSIS — Z992 Dependence on renal dialysis: Secondary | ICD-10-CM | POA: Diagnosis not present

## 2022-03-16 DIAGNOSIS — N2581 Secondary hyperparathyroidism of renal origin: Secondary | ICD-10-CM | POA: Diagnosis not present

## 2022-03-16 DIAGNOSIS — N186 End stage renal disease: Secondary | ICD-10-CM | POA: Diagnosis not present

## 2022-03-19 DIAGNOSIS — Z992 Dependence on renal dialysis: Secondary | ICD-10-CM | POA: Diagnosis not present

## 2022-03-19 DIAGNOSIS — N2581 Secondary hyperparathyroidism of renal origin: Secondary | ICD-10-CM | POA: Diagnosis not present

## 2022-03-19 DIAGNOSIS — N186 End stage renal disease: Secondary | ICD-10-CM | POA: Diagnosis not present

## 2022-03-21 DIAGNOSIS — N186 End stage renal disease: Secondary | ICD-10-CM | POA: Diagnosis not present

## 2022-03-21 DIAGNOSIS — Z992 Dependence on renal dialysis: Secondary | ICD-10-CM | POA: Diagnosis not present

## 2022-03-21 DIAGNOSIS — I129 Hypertensive chronic kidney disease with stage 1 through stage 4 chronic kidney disease, or unspecified chronic kidney disease: Secondary | ICD-10-CM | POA: Diagnosis not present

## 2022-03-21 DIAGNOSIS — N2581 Secondary hyperparathyroidism of renal origin: Secondary | ICD-10-CM | POA: Diagnosis not present

## 2022-03-23 DIAGNOSIS — Z992 Dependence on renal dialysis: Secondary | ICD-10-CM | POA: Diagnosis not present

## 2022-03-23 DIAGNOSIS — N2581 Secondary hyperparathyroidism of renal origin: Secondary | ICD-10-CM | POA: Diagnosis not present

## 2022-03-23 DIAGNOSIS — N186 End stage renal disease: Secondary | ICD-10-CM | POA: Diagnosis not present

## 2022-03-26 DIAGNOSIS — N186 End stage renal disease: Secondary | ICD-10-CM | POA: Diagnosis not present

## 2022-03-26 DIAGNOSIS — N2581 Secondary hyperparathyroidism of renal origin: Secondary | ICD-10-CM | POA: Diagnosis not present

## 2022-03-26 DIAGNOSIS — Z992 Dependence on renal dialysis: Secondary | ICD-10-CM | POA: Diagnosis not present

## 2022-03-28 DIAGNOSIS — N2581 Secondary hyperparathyroidism of renal origin: Secondary | ICD-10-CM | POA: Diagnosis not present

## 2022-03-28 DIAGNOSIS — Z992 Dependence on renal dialysis: Secondary | ICD-10-CM | POA: Diagnosis not present

## 2022-03-28 DIAGNOSIS — N186 End stage renal disease: Secondary | ICD-10-CM | POA: Diagnosis not present

## 2022-03-30 ENCOUNTER — Ambulatory Visit: Payer: Medicare HMO | Attending: Internal Medicine

## 2022-03-30 DIAGNOSIS — Z992 Dependence on renal dialysis: Secondary | ICD-10-CM | POA: Diagnosis not present

## 2022-03-30 DIAGNOSIS — Z0000 Encounter for general adult medical examination without abnormal findings: Secondary | ICD-10-CM | POA: Diagnosis not present

## 2022-03-30 DIAGNOSIS — N186 End stage renal disease: Secondary | ICD-10-CM | POA: Diagnosis not present

## 2022-03-30 DIAGNOSIS — N2581 Secondary hyperparathyroidism of renal origin: Secondary | ICD-10-CM | POA: Diagnosis not present

## 2022-03-30 NOTE — Progress Notes (Addendum)
Subjective:   Seth Keller is a 57 y.o. male who presents for Medicare Annual/Subsequent preventive examination.  Review of Systems    connected with  Seth Keller on  03/30/22 at  1:33 pm by telephone and verified that I am speaking with the correct person using two identifiers. I discussed the limitations, risks, security and privacy concerns of performing an evaluation and management service by telephone and the availability of in person appointments. I also discussed with the patient that there may be a patient responsible charge related to this service. The patient expressed understanding and agreed to proceed.  Patient location:  Home  [facility]  Persons on the telephone call:   Myself [facility] ) and Seth Keller        Objective:    There were no vitals filed for this visit. There is no height or weight on file to calculate BMI.     03/30/2022    1:36 PM 10/17/2021    6:26 AM 12/19/2020    2:58 PM 08/12/2020    3:59 PM 08/27/2019   10:08 AM 08/21/2016    1:58 PM 12/22/2015   10:25 AM  Advanced Directives  Does Patient Have a Medical Advance Directive? No No No No No No No  Would patient like information on creating a medical advance directive? No - Patient declined No - Patient declined No - Patient declined  Yes (Inpatient - patient defers creating a medical advance directive at this time - Information given)  No - patient declined information    Current Medications (verified) Outpatient Encounter Medications as of 03/30/2022  Medication Sig   aspirin EC 81 MG tablet Take 1 tablet (81 mg total) by mouth daily.   atorvastatin (LIPITOR) 80 MG tablet Take 80 mg by mouth daily.   Blood Pressure Monitor DEVI Use as directed to check home blood pressure 2-3 times a week   cholecalciferol (VITAMIN D3) 25 MCG (1000 UNIT) tablet Take 1,000 Units by mouth daily.   clopidogrel (PLAVIX) 75 MG tablet Take 1 tablet (75 mg total) by mouth daily.   furosemide (LASIX) 40 MG  tablet Take 1 tablet (40 mg total) by mouth daily.   metoprolol tartrate (LOPRESSOR) 25 MG tablet Take 1 tablet (25 mg total) by mouth 2 (two) times daily.   nitroGLYCERIN (NITROSTAT) 0.4 MG SL tablet Place 1 tablet (0.4 mg total) under the tongue every 5 (five) minutes as needed for chest pain.   sucroferric oxyhydroxide (VELPHORO) 500 MG chewable tablet Chew 500-1,500 mg by mouth See admin instructions. Take 1500 mg with each meal and 500 mg with each snack   No facility-administered encounter medications on file as of 03/30/2022.    Allergies (verified) Iodinated contrast media and Shellfish allergy   History: Past Medical History:  Diagnosis Date   Atrial fibrillation with RVR ([place])    a. 12/2013 post-op CABG ->on amio.   CAD (coronary artery disease)    a. 12/2013 NSTEMI/Cath: 3VD;  b. 12/2013 CABG x 4: LIMA->LAD, VG->RI, VG->OM1->LPDA.   Chronic combined systolic and diastolic CHF (congestive heart failure) ([place])    a. 12/2013 Echo: EF 45%, Gr 2 DD.   CKD (chronic kidney disease), stage III (HCC)    Dyspnea    Hypertension    a. Dx ~ 2000.   Ischemic cardiomyopathy    a. 12/2013 Echo: EF 45%, Gr 2 DD, basal-mid inflat and inf AK, mild MR.   Myocardial infarction ([place])    Tobacco abuse  Past Surgical History:  Procedure Laterality Date   AV FISTULA PLACEMENT Right 08/15/2020   Procedure: RIGHT ARM RADIOCEPHALIC ARTERIOVENOUS (AV) FISTULA CREATION;  Surgeon: Cherre Robins, MD;  Location: [facility];  Service: Vascular;  Laterality: Right;   BIOPSY  10/17/2021   Procedure: BIOPSY;  Surgeon: Otis Brace, MD;  Location: [facility];  Service: Gastroenterology;;   CARDIAC CATHETERIZATION     COLONOSCOPY WITH PROPOFOL N/A 10/17/2021   Procedure: COLONOSCOPY WITH PROPOFOL;  Surgeon: Otis Brace, MD;  Location: [facility];  Service: Gastroenterology;  Laterality: N/A;   CORONARY ARTERY BYPASS GRAFT N/A 01/01/2014   Procedure: CORONARY ARTERY BYPASS GRAFTING (CABG) times  four using left internal mammary artery and right saphenous vein;  Surgeon: Gaye Pollack, MD;  Location: [facility];  Service: Open Heart Surgery;  Laterality: N/A;   INTRAOPERATIVE TRANSESOPHAGEAL ECHOCARDIOGRAM N/A 01/01/2014   Procedure: INTRAOPERATIVE TRANSESOPHAGEAL ECHOCARDIOGRAM;  Surgeon: Gaye Pollack, MD;  Location: [facility];  Service: Open Heart Surgery;  Laterality: N/A;   LEFT HEART CATHETERIZATION WITH CORONARY ANGIOGRAM N/A 12/29/2013   Procedure: LEFT HEART CATHETERIZATION WITH CORONARY ANGIOGRAM;  Surgeon: Sinclair Grooms, MD;  Location: [facility];  Service: Cardiovascular;  Laterality: N/A;   none     POLYPECTOMY  10/17/2021   Procedure: POLYPECTOMY;  Surgeon: Otis Brace, MD;  Location: [facility];  Service: Gastroenterology;;   Family History  Problem Relation Age of Onset   Heart disease Mother 46   Hypertension Father 60   Asthma Father    Diabetes Paternal Grandfather    Cancer Neg Hx    Social History   Socioeconomic History   Marital status: Single    Spouse name: Not on file   Number of children: 0   Years of education: 12   Highest education level: Not on file  Occupational History   Occupation: Unemployed    Comment: 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   From [place], originally.   Moved to [place] in 2012.    Social Determinants of Health   Financial Resource Strain: Low Risk  (03/30/2022)    Overall Financial Resource Strain (CARDIA)    Difficulty of Paying Living Expenses: Not hard at all  Food Insecurity: No Food Insecurity (03/30/2022)   Hunger Vital Sign    Worried About Running Out of Food in the Last Year: Never true    Ran Out of Food in the Last Year: Never true  Transportation Needs: No Transportation Needs (03/30/2022)   PRAPARE - Hydrologist (Medical): No    Lack of Transportation (Non-Medical): No  Physical Activity: Insufficiently Active (03/30/2022)   Exercise Vital Sign    Days of Exercise per Week: 1 day    Minutes of Exercise per Session: 30 min  Stress: No Stress Concern Present (03/30/2022)   [place]    Feeling of Stress : Not at all  Social Connections: Socially Isolated (03/30/2022)   Social Connection and Isolation Panel [NHANES]    Frequency of Communication with Friends and Family: More than three times a week    Frequency of Social Gatherings with Friends and Family: Once a week    Attends Religious Services: Never    Marine scientist or Organizations: No    Attends Archivist Meetings: Never    Marital Status: Widowed    Tobacco Counseling Counseling given: Not Answered   Clinical Intake:     Pain : No/denies pain     Diabetes: No  How often do you need to have someone help you when you read instructions, pamphlets, or other written materials from your doctor or pharmacy?: (P) 1 - Never  Diabetic?no         Activities of Daily Living    03/30/2022    1:42 PM 03/26/2022    3:56 PM  In your present state of health, do you have any difficulty performing the following activities:  Hearing? 0 0  Vision? 0 0  Difficulty concentrating or making decisions? 0 0  Walking or climbing stairs? 1 1  Dressing or bathing? 0 0  Doing errands, shopping? 0 0  Preparing Food and eating ? N N  Using the Toilet? N N  In the past six  months, have you accidently leaked urine? N N  Do you have problems with loss of bowel control? N N  Managing your Medications? N N  Managing your Finances? N N  Housekeeping or managing your Housekeeping? N N    Patient Care Team: Ladell Pier, MD as PCP - General (Internal Medicine) Burnell Blanks, MD as PCP - Cardiology (Cardiology) [place] any recent Medical Services you may have received from other than [facility] providers in the past year (date may be approximate).     Assessment:   This is a routine wellness examination for Wilbert.  Hearing/Vision screen No results found.  Dietary issues and exercise activities discussed:     Goals Addressed   None   Depression Screen    02/27/2022    9:58 AM 04/03/2021    8:32 AM 12/19/2020    3:03 PM 11/28/2020    9:58 AM 03/18/2020    8:59 AM 08/27/2019   10:10 AM 07/06/2019    9:14 AM  PHQ 2/9 Scores  PHQ - 2 Score 0 0 0 0 0 0 0  PHQ- 9 Score 1     0 1    Fall Risk    03/30/2022    1:36 PM 03/26/2022    3:56 PM 02/27/2022    9:53 AM 04/03/2021    8:32 AM 12/19/2020    2:58 PM  [place] in the past year? 0 0 0 0 0  Number falls in past yr: 0 0 0 0 0  Injury with Fall? 0 0 0 0 0  Risk for fall due to : No Fall Risks  No Fall Risks No Fall Risks   Follow up     Falls evaluation completed;Education provided    FALL RISK PREVENTION PERTAINING TO THE HOME:  Any stairs in or around the home? Yes  If so, are there any without handrails? Yes  Home free of loose  throw rugs in walkways, pet beds, electrical cords, etc? No  Adequate lighting in your home to reduce risk of falls? Yes   ASSISTIVE DEVICES UTILIZED TO PREVENT FALLS:  Life alert? No  Use of a cane, walker or w/c? No  Grab bars in the bathroom? No  Shower chair or bench in shower? No  Elevated toilet seat or a handicapped toilet? No   TIMED UP AND GO:  Was the test performed? Yes .  Length of time to  ambulate 10 feet:  sec.   Gait slow and steady without use of assistive device  Cognitive Function:    03/30/2022    1:43 PM 12/19/2020    3:04 PM 08/27/2019   10:12 AM  MMSE - Mini Mental State Exam  Orientation to time 5 5 5  $ Orientation to Place 5 5 5  $ Registration 3 3 3  $ Attention/ Calculation 5 5 5  $ Recall 3 3 3  $ Language- name 2 objects 2 2 2  $ Language- repeat 1 1 1  $ Language- follow 3 step command 3 3 3  $ Language- read & follow direction 1 1 1  $ Write a sentence 1 1 1  $ Copy design 1 1 1  $ Total score 30 30 30        $ 03/30/2022    1:46 PM  6CIT Screen  What Year? 0 points  What month? 0 points  What time? 0 points  Count back from 20 0 points  Months in reverse 0 points  Repeat phrase 0 points  Total Score 0 points    Immunizations Immunization History  Administered Date(s) Administered   Influenza,inj,Quad PF,6+ Mos 01/11/2014   PFIZER Comirnaty(Gray Top)Covid-19 Tri-Sucrose Vaccine 06/08/2020, 06/29/2020   Tdap 01/11/2014    TDAP status: Up to date  Flu Vaccine status: Declined, Education has been provided regarding the importance of this vaccine but patient still declined. Advised may receive this vaccine at local pharmacy or Health Dept. Aware to provide a copy of the vaccination record if obtained from local pharmacy or Health Dept. Verbalized acceptance and understanding.  Pneumococcal vaccine status: Due, Education has been provided regarding the importance of this vaccine. Advised may receive this vaccine at local pharmacy or Health Dept. Aware to provide a copy of the vaccination record if obtained from local pharmacy or Health Dept. Verbalized acceptance and understanding.  Covid-19 vaccine status: Declined, Education has been provided regarding the importance of this vaccine but patient still declined. Advised may receive this vaccine at local pharmacy or Health Dept.or vaccine clinic. Aware to provide a copy of the vaccination record if obtained from  local pharmacy or Health Dept. Verbalized acceptance and understanding.  Qualifies for Shingles Vaccine? Yes   Zostavax completed No   Shingrix Completed?: No.    Education has been provided regarding the importance of this vaccine. Patient has been advised to call insurance company to determine out of pocket expense if they have not yet received this vaccine. Advised may also receive vaccine at local pharmacy or Health Dept. Verbalized acceptance and understanding.  Screening Tests Health Maintenance  Topic Date Due   INFLUENZA VACCINE  05/20/2022 (Originally 09/19/2021)   COVID-19 Vaccine (3 - Pfizer risk series) 10/22/2022 (Originally 07/27/2020)   Zoster Vaccines- Shingrix (1 of 2) 02/28/2023 (Originally [birth year]/1984)   Medicare Annual Wellness (AWV)  03/31/2023   DTaP/Tdap/Td (2 - Td or Tdap) 01/12/2024   COLONOSCOPY (Pts 45-82yr Insurance coverage will need to be confirmed)  10/18/2026   Hepatitis C Screening  Completed  HIV Screening  Completed   HPV VACCINES  Aged Out    Health Maintenance  There are no preventive care reminders to display for this patient.   Colorectal cancer screening: Type of screening: Colonoscopy. Completed 10/17/21. Repeat every 5 years  Lung Cancer Screening: (Low Dose CT Chest recommended if Age 78-80 years, 30 pack-year currently smoking OR have quit w/in 15years.) does not qualify.   Lung Cancer Screening Referral: n/a  Additional Screening:  Hepatitis C Screening: does qualify; Completed 12/19/20  Vision Screening: Recommended annual ophthalmology exams for early detection of glaucoma and other disorders of the eye. Is the patient up to date with their annual eye exam?  No  Who is the provider or what is the name of the office in which the patient attends annual eye exams? patient declined referral  If pt is not established with a provider, would they like to be referred to a provider to establish care? No .   Dental Screening: Recommended annual  dental exams for proper oral hygiene  Community Resource Referral / Chronic Care Management: CRR required this visit?  No   CCM required this visit?  No      Plan:     I have personally reviewed and noted the following in the patient's chart:   Medical and social history Use of alcohol, tobacco or illicit drugs  Current medications and supplements including opioid prescriptions. Patient is not currently taking opioid prescriptions. Functional ability and status Nutritional status Physical activity Advanced directives List of other physicians Hospitalizations, surgeries, and ER visits in previous 12 months Vitals Screenings to include cognitive, depression, and falls Referrals and appointments  In addition, I have reviewed and discussed with patient certain preventive protocols, quality metrics, and best practice recommendations. A written personalized care plan for preventive services as well as general preventive health recommendations were provided to patient.     Lillie Columbia, [place]   03/30/2022   Nurse Notes:

## 2022-04-02 DIAGNOSIS — Z992 Dependence on renal dialysis: Secondary | ICD-10-CM | POA: Diagnosis not present

## 2022-04-02 DIAGNOSIS — N186 End stage renal disease: Secondary | ICD-10-CM | POA: Diagnosis not present

## 2022-04-02 DIAGNOSIS — N2581 Secondary hyperparathyroidism of renal origin: Secondary | ICD-10-CM | POA: Diagnosis not present

## 2022-04-04 DIAGNOSIS — N186 End stage renal disease: Secondary | ICD-10-CM | POA: Diagnosis not present

## 2022-04-04 DIAGNOSIS — N2581 Secondary hyperparathyroidism of renal origin: Secondary | ICD-10-CM | POA: Diagnosis not present

## 2022-04-04 DIAGNOSIS — Z992 Dependence on renal dialysis: Secondary | ICD-10-CM | POA: Diagnosis not present

## 2022-04-06 DIAGNOSIS — Z992 Dependence on renal dialysis: Secondary | ICD-10-CM | POA: Diagnosis not present

## 2022-04-06 DIAGNOSIS — N186 End stage renal disease: Secondary | ICD-10-CM | POA: Diagnosis not present

## 2022-04-06 DIAGNOSIS — N2581 Secondary hyperparathyroidism of renal origin: Secondary | ICD-10-CM | POA: Diagnosis not present

## 2022-04-09 DIAGNOSIS — N186 End stage renal disease: Secondary | ICD-10-CM | POA: Diagnosis not present

## 2022-04-09 DIAGNOSIS — N2581 Secondary hyperparathyroidism of renal origin: Secondary | ICD-10-CM | POA: Diagnosis not present

## 2022-04-09 DIAGNOSIS — Z992 Dependence on renal dialysis: Secondary | ICD-10-CM | POA: Diagnosis not present

## 2022-04-10 DIAGNOSIS — Z992 Dependence on renal dialysis: Secondary | ICD-10-CM | POA: Diagnosis not present

## 2022-04-10 DIAGNOSIS — N186 End stage renal disease: Secondary | ICD-10-CM | POA: Diagnosis not present

## 2022-04-10 DIAGNOSIS — T82858A Stenosis of vascular prosthetic devices, implants and grafts, initial encounter: Secondary | ICD-10-CM | POA: Diagnosis not present

## 2022-04-10 DIAGNOSIS — I871 Compression of vein: Secondary | ICD-10-CM | POA: Diagnosis not present

## 2022-04-11 DIAGNOSIS — N186 End stage renal disease: Secondary | ICD-10-CM | POA: Diagnosis not present

## 2022-04-11 DIAGNOSIS — Z992 Dependence on renal dialysis: Secondary | ICD-10-CM | POA: Diagnosis not present

## 2022-04-11 DIAGNOSIS — N2581 Secondary hyperparathyroidism of renal origin: Secondary | ICD-10-CM | POA: Diagnosis not present

## 2022-04-13 DIAGNOSIS — N2581 Secondary hyperparathyroidism of renal origin: Secondary | ICD-10-CM | POA: Diagnosis not present

## 2022-04-13 DIAGNOSIS — Z992 Dependence on renal dialysis: Secondary | ICD-10-CM | POA: Diagnosis not present

## 2022-04-13 DIAGNOSIS — N186 End stage renal disease: Secondary | ICD-10-CM | POA: Diagnosis not present

## 2022-04-16 DIAGNOSIS — N186 End stage renal disease: Secondary | ICD-10-CM | POA: Diagnosis not present

## 2022-04-16 DIAGNOSIS — Z992 Dependence on renal dialysis: Secondary | ICD-10-CM | POA: Diagnosis not present

## 2022-04-16 DIAGNOSIS — N2581 Secondary hyperparathyroidism of renal origin: Secondary | ICD-10-CM | POA: Diagnosis not present

## 2022-04-18 DIAGNOSIS — N186 End stage renal disease: Secondary | ICD-10-CM | POA: Diagnosis not present

## 2022-04-18 DIAGNOSIS — Z992 Dependence on renal dialysis: Secondary | ICD-10-CM | POA: Diagnosis not present

## 2022-04-18 DIAGNOSIS — N2581 Secondary hyperparathyroidism of renal origin: Secondary | ICD-10-CM | POA: Diagnosis not present

## 2022-04-19 DIAGNOSIS — I129 Hypertensive chronic kidney disease with stage 1 through stage 4 chronic kidney disease, or unspecified chronic kidney disease: Secondary | ICD-10-CM | POA: Diagnosis not present

## 2022-04-19 DIAGNOSIS — Z992 Dependence on renal dialysis: Secondary | ICD-10-CM | POA: Diagnosis not present

## 2022-04-19 DIAGNOSIS — N186 End stage renal disease: Secondary | ICD-10-CM | POA: Diagnosis not present

## 2022-04-20 DIAGNOSIS — N2581 Secondary hyperparathyroidism of renal origin: Secondary | ICD-10-CM | POA: Diagnosis not present

## 2022-04-20 DIAGNOSIS — Z992 Dependence on renal dialysis: Secondary | ICD-10-CM | POA: Diagnosis not present

## 2022-04-20 DIAGNOSIS — N186 End stage renal disease: Secondary | ICD-10-CM | POA: Diagnosis not present

## 2022-04-23 DIAGNOSIS — Z992 Dependence on renal dialysis: Secondary | ICD-10-CM | POA: Diagnosis not present

## 2022-04-23 DIAGNOSIS — N2581 Secondary hyperparathyroidism of renal origin: Secondary | ICD-10-CM | POA: Diagnosis not present

## 2022-04-23 DIAGNOSIS — N186 End stage renal disease: Secondary | ICD-10-CM | POA: Diagnosis not present

## 2022-04-25 DIAGNOSIS — N2581 Secondary hyperparathyroidism of renal origin: Secondary | ICD-10-CM | POA: Diagnosis not present

## 2022-04-25 DIAGNOSIS — Z992 Dependence on renal dialysis: Secondary | ICD-10-CM | POA: Diagnosis not present

## 2022-04-25 DIAGNOSIS — N186 End stage renal disease: Secondary | ICD-10-CM | POA: Diagnosis not present

## 2022-04-27 DIAGNOSIS — N186 End stage renal disease: Secondary | ICD-10-CM | POA: Diagnosis not present

## 2022-04-27 DIAGNOSIS — Z992 Dependence on renal dialysis: Secondary | ICD-10-CM | POA: Diagnosis not present

## 2022-04-27 DIAGNOSIS — N2581 Secondary hyperparathyroidism of renal origin: Secondary | ICD-10-CM | POA: Diagnosis not present

## 2022-04-30 DIAGNOSIS — N2581 Secondary hyperparathyroidism of renal origin: Secondary | ICD-10-CM | POA: Diagnosis not present

## 2022-04-30 DIAGNOSIS — N186 End stage renal disease: Secondary | ICD-10-CM | POA: Diagnosis not present

## 2022-04-30 DIAGNOSIS — Z992 Dependence on renal dialysis: Secondary | ICD-10-CM | POA: Diagnosis not present

## 2022-05-02 DIAGNOSIS — N186 End stage renal disease: Secondary | ICD-10-CM | POA: Diagnosis not present

## 2022-05-02 DIAGNOSIS — N2581 Secondary hyperparathyroidism of renal origin: Secondary | ICD-10-CM | POA: Diagnosis not present

## 2022-05-02 DIAGNOSIS — Z992 Dependence on renal dialysis: Secondary | ICD-10-CM | POA: Diagnosis not present

## 2022-05-04 DIAGNOSIS — N186 End stage renal disease: Secondary | ICD-10-CM | POA: Diagnosis not present

## 2022-05-04 DIAGNOSIS — N2581 Secondary hyperparathyroidism of renal origin: Secondary | ICD-10-CM | POA: Diagnosis not present

## 2022-05-04 DIAGNOSIS — Z992 Dependence on renal dialysis: Secondary | ICD-10-CM | POA: Diagnosis not present

## 2022-05-07 DIAGNOSIS — N186 End stage renal disease: Secondary | ICD-10-CM | POA: Diagnosis not present

## 2022-05-07 DIAGNOSIS — N2581 Secondary hyperparathyroidism of renal origin: Secondary | ICD-10-CM | POA: Diagnosis not present

## 2022-05-07 DIAGNOSIS — Z992 Dependence on renal dialysis: Secondary | ICD-10-CM | POA: Diagnosis not present

## 2022-05-09 DIAGNOSIS — N186 End stage renal disease: Secondary | ICD-10-CM | POA: Diagnosis not present

## 2022-05-09 DIAGNOSIS — Z992 Dependence on renal dialysis: Secondary | ICD-10-CM | POA: Diagnosis not present

## 2022-05-09 DIAGNOSIS — N2581 Secondary hyperparathyroidism of renal origin: Secondary | ICD-10-CM | POA: Diagnosis not present

## 2022-05-11 DIAGNOSIS — N2581 Secondary hyperparathyroidism of renal origin: Secondary | ICD-10-CM | POA: Diagnosis not present

## 2022-05-11 DIAGNOSIS — Z992 Dependence on renal dialysis: Secondary | ICD-10-CM | POA: Diagnosis not present

## 2022-05-11 DIAGNOSIS — N186 End stage renal disease: Secondary | ICD-10-CM | POA: Diagnosis not present

## 2022-05-14 DIAGNOSIS — Z992 Dependence on renal dialysis: Secondary | ICD-10-CM | POA: Diagnosis not present

## 2022-05-14 DIAGNOSIS — N2581 Secondary hyperparathyroidism of renal origin: Secondary | ICD-10-CM | POA: Diagnosis not present

## 2022-05-14 DIAGNOSIS — N186 End stage renal disease: Secondary | ICD-10-CM | POA: Diagnosis not present

## 2022-05-16 DIAGNOSIS — N186 End stage renal disease: Secondary | ICD-10-CM | POA: Diagnosis not present

## 2022-05-16 DIAGNOSIS — N2581 Secondary hyperparathyroidism of renal origin: Secondary | ICD-10-CM | POA: Diagnosis not present

## 2022-05-16 DIAGNOSIS — Z992 Dependence on renal dialysis: Secondary | ICD-10-CM | POA: Diagnosis not present

## 2022-05-18 DIAGNOSIS — N186 End stage renal disease: Secondary | ICD-10-CM | POA: Diagnosis not present

## 2022-05-18 DIAGNOSIS — Z992 Dependence on renal dialysis: Secondary | ICD-10-CM | POA: Diagnosis not present

## 2022-05-18 DIAGNOSIS — N2581 Secondary hyperparathyroidism of renal origin: Secondary | ICD-10-CM | POA: Diagnosis not present

## 2022-05-20 DIAGNOSIS — I129 Hypertensive chronic kidney disease with stage 1 through stage 4 chronic kidney disease, or unspecified chronic kidney disease: Secondary | ICD-10-CM | POA: Diagnosis not present

## 2022-05-20 DIAGNOSIS — Z992 Dependence on renal dialysis: Secondary | ICD-10-CM | POA: Diagnosis not present

## 2022-05-20 DIAGNOSIS — N186 End stage renal disease: Secondary | ICD-10-CM | POA: Diagnosis not present

## 2022-05-21 DIAGNOSIS — Z992 Dependence on renal dialysis: Secondary | ICD-10-CM | POA: Diagnosis not present

## 2022-05-21 DIAGNOSIS — N2581 Secondary hyperparathyroidism of renal origin: Secondary | ICD-10-CM | POA: Diagnosis not present

## 2022-05-21 DIAGNOSIS — N186 End stage renal disease: Secondary | ICD-10-CM | POA: Diagnosis not present

## 2022-05-23 DIAGNOSIS — N186 End stage renal disease: Secondary | ICD-10-CM | POA: Diagnosis not present

## 2022-05-23 DIAGNOSIS — N2581 Secondary hyperparathyroidism of renal origin: Secondary | ICD-10-CM | POA: Diagnosis not present

## 2022-05-23 DIAGNOSIS — Z992 Dependence on renal dialysis: Secondary | ICD-10-CM | POA: Diagnosis not present

## 2022-05-25 DIAGNOSIS — N2581 Secondary hyperparathyroidism of renal origin: Secondary | ICD-10-CM | POA: Diagnosis not present

## 2022-05-25 DIAGNOSIS — Z992 Dependence on renal dialysis: Secondary | ICD-10-CM | POA: Diagnosis not present

## 2022-05-25 DIAGNOSIS — N186 End stage renal disease: Secondary | ICD-10-CM | POA: Diagnosis not present

## 2022-05-28 DIAGNOSIS — Z992 Dependence on renal dialysis: Secondary | ICD-10-CM | POA: Diagnosis not present

## 2022-05-28 DIAGNOSIS — N2581 Secondary hyperparathyroidism of renal origin: Secondary | ICD-10-CM | POA: Diagnosis not present

## 2022-05-28 DIAGNOSIS — N186 End stage renal disease: Secondary | ICD-10-CM | POA: Diagnosis not present

## 2022-05-30 DIAGNOSIS — N2581 Secondary hyperparathyroidism of renal origin: Secondary | ICD-10-CM | POA: Diagnosis not present

## 2022-05-30 DIAGNOSIS — Z992 Dependence on renal dialysis: Secondary | ICD-10-CM | POA: Diagnosis not present

## 2022-05-30 DIAGNOSIS — N186 End stage renal disease: Secondary | ICD-10-CM | POA: Diagnosis not present

## 2022-06-01 DIAGNOSIS — N2581 Secondary hyperparathyroidism of renal origin: Secondary | ICD-10-CM | POA: Diagnosis not present

## 2022-06-01 DIAGNOSIS — Z992 Dependence on renal dialysis: Secondary | ICD-10-CM | POA: Diagnosis not present

## 2022-06-01 DIAGNOSIS — N186 End stage renal disease: Secondary | ICD-10-CM | POA: Diagnosis not present

## 2022-06-04 DIAGNOSIS — N186 End stage renal disease: Secondary | ICD-10-CM | POA: Diagnosis not present

## 2022-06-04 DIAGNOSIS — N2581 Secondary hyperparathyroidism of renal origin: Secondary | ICD-10-CM | POA: Diagnosis not present

## 2022-06-04 DIAGNOSIS — Z992 Dependence on renal dialysis: Secondary | ICD-10-CM | POA: Diagnosis not present

## 2022-06-06 DIAGNOSIS — N186 End stage renal disease: Secondary | ICD-10-CM | POA: Diagnosis not present

## 2022-06-06 DIAGNOSIS — N2581 Secondary hyperparathyroidism of renal origin: Secondary | ICD-10-CM | POA: Diagnosis not present

## 2022-06-06 DIAGNOSIS — Z992 Dependence on renal dialysis: Secondary | ICD-10-CM | POA: Diagnosis not present

## 2022-06-08 DIAGNOSIS — Z992 Dependence on renal dialysis: Secondary | ICD-10-CM | POA: Diagnosis not present

## 2022-06-08 DIAGNOSIS — N2581 Secondary hyperparathyroidism of renal origin: Secondary | ICD-10-CM | POA: Diagnosis not present

## 2022-06-08 DIAGNOSIS — N186 End stage renal disease: Secondary | ICD-10-CM | POA: Diagnosis not present

## 2022-06-11 DIAGNOSIS — Z992 Dependence on renal dialysis: Secondary | ICD-10-CM | POA: Diagnosis not present

## 2022-06-11 DIAGNOSIS — N2581 Secondary hyperparathyroidism of renal origin: Secondary | ICD-10-CM | POA: Diagnosis not present

## 2022-06-11 DIAGNOSIS — N186 End stage renal disease: Secondary | ICD-10-CM | POA: Diagnosis not present

## 2022-06-13 DIAGNOSIS — N2581 Secondary hyperparathyroidism of renal origin: Secondary | ICD-10-CM | POA: Diagnosis not present

## 2022-06-13 DIAGNOSIS — Z992 Dependence on renal dialysis: Secondary | ICD-10-CM | POA: Diagnosis not present

## 2022-06-13 DIAGNOSIS — N186 End stage renal disease: Secondary | ICD-10-CM | POA: Diagnosis not present

## 2022-06-15 DIAGNOSIS — N186 End stage renal disease: Secondary | ICD-10-CM | POA: Diagnosis not present

## 2022-06-15 DIAGNOSIS — Z992 Dependence on renal dialysis: Secondary | ICD-10-CM | POA: Diagnosis not present

## 2022-06-15 DIAGNOSIS — N2581 Secondary hyperparathyroidism of renal origin: Secondary | ICD-10-CM | POA: Diagnosis not present

## 2022-06-18 DIAGNOSIS — N186 End stage renal disease: Secondary | ICD-10-CM | POA: Diagnosis not present

## 2022-06-18 DIAGNOSIS — N2581 Secondary hyperparathyroidism of renal origin: Secondary | ICD-10-CM | POA: Diagnosis not present

## 2022-06-18 DIAGNOSIS — Z992 Dependence on renal dialysis: Secondary | ICD-10-CM | POA: Diagnosis not present

## 2022-06-19 DIAGNOSIS — Z992 Dependence on renal dialysis: Secondary | ICD-10-CM | POA: Diagnosis not present

## 2022-06-19 DIAGNOSIS — N186 End stage renal disease: Secondary | ICD-10-CM | POA: Diagnosis not present

## 2022-06-19 DIAGNOSIS — I129 Hypertensive chronic kidney disease with stage 1 through stage 4 chronic kidney disease, or unspecified chronic kidney disease: Secondary | ICD-10-CM | POA: Diagnosis not present

## 2022-06-20 DIAGNOSIS — N186 End stage renal disease: Secondary | ICD-10-CM | POA: Diagnosis not present

## 2022-06-20 DIAGNOSIS — N2581 Secondary hyperparathyroidism of renal origin: Secondary | ICD-10-CM | POA: Diagnosis not present

## 2022-06-20 DIAGNOSIS — Z992 Dependence on renal dialysis: Secondary | ICD-10-CM | POA: Diagnosis not present

## 2022-06-22 DIAGNOSIS — Z992 Dependence on renal dialysis: Secondary | ICD-10-CM | POA: Diagnosis not present

## 2022-06-22 DIAGNOSIS — N2581 Secondary hyperparathyroidism of renal origin: Secondary | ICD-10-CM | POA: Diagnosis not present

## 2022-06-22 DIAGNOSIS — N186 End stage renal disease: Secondary | ICD-10-CM | POA: Diagnosis not present

## 2022-06-25 DIAGNOSIS — N186 End stage renal disease: Secondary | ICD-10-CM | POA: Diagnosis not present

## 2022-06-25 DIAGNOSIS — Z992 Dependence on renal dialysis: Secondary | ICD-10-CM | POA: Diagnosis not present

## 2022-06-25 DIAGNOSIS — N2581 Secondary hyperparathyroidism of renal origin: Secondary | ICD-10-CM | POA: Diagnosis not present

## 2022-06-26 ENCOUNTER — Encounter: Payer: Self-pay | Admitting: Internal Medicine

## 2022-06-26 ENCOUNTER — Ambulatory Visit: Payer: Medicare HMO | Attending: Internal Medicine | Admitting: Internal Medicine

## 2022-06-26 VITALS — BP 107/71 | HR 67 | Temp 98.5°F | Ht 70.0 in | Wt 229.0 lb

## 2022-06-26 DIAGNOSIS — I10 Essential (primary) hypertension: Secondary | ICD-10-CM | POA: Diagnosis not present

## 2022-06-26 DIAGNOSIS — N2581 Secondary hyperparathyroidism of renal origin: Secondary | ICD-10-CM | POA: Diagnosis not present

## 2022-06-26 DIAGNOSIS — N186 End stage renal disease: Secondary | ICD-10-CM | POA: Diagnosis not present

## 2022-06-26 DIAGNOSIS — I5042 Chronic combined systolic (congestive) and diastolic (congestive) heart failure: Secondary | ICD-10-CM | POA: Diagnosis not present

## 2022-06-26 DIAGNOSIS — I25708 Atherosclerosis of coronary artery bypass graft(s), unspecified, with other forms of angina pectoris: Secondary | ICD-10-CM

## 2022-06-26 DIAGNOSIS — Z992 Dependence on renal dialysis: Secondary | ICD-10-CM | POA: Diagnosis not present

## 2022-06-26 MED ORDER — NITROGLYCERIN 0.4 MG SL SUBL: 0.4000 mg | SUBLINGUAL_TABLET | SUBLINGUAL | 3 refills | Status: DC | PRN

## 2022-06-26 MED ORDER — CLOPIDOGREL BISULFATE 75 MG PO TABS: 75.0000 mg | ORAL_TABLET | Freq: Every day | ORAL | 1 refills | Status: DC

## 2022-06-26 MED ORDER — METOPROLOL TARTRATE 25 MG PO TABS: 25.0000 mg | ORAL_TABLET | Freq: Two times a day (BID) | ORAL | 1 refills | Status: DC

## 2022-06-26 MED ORDER — FUROSEMIDE 40 MG PO TABS: 40.0000 mg | ORAL_TABLET | Freq: Every day | ORAL | 1 refills | Status: DC

## 2022-06-26 NOTE — Progress Notes (Signed)
Patient ID: Seth Keller, male    DOB: 1965/09/11  MRN: [id number]  CC: Coronary Artery Disease (Coronary artery disease, HTN & other chronic condistions f/u. /No questions / concernss/Med refills)   Subjective: [name] is a 57 y.o. male who presents for chronic ds management.  Seth Keller, his fiance is with him  His concerns today include:  Pt with hx of CAD s/p CABG x 4 (Dr. 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 Hyman Hopes) on transplant list at [facility], HTN, HL, ICM but last EF 08/2016 improved to 50-55%, 2DD,  obesity.   HTN/CAD/combined CHF Currently taking: see medication list.  Compliant with metoprolol 25 mg twice a day, aspirin 81 mg daily, atorvastatin 80 mg daily, Plavix 75 mg daily and furosemide 40 mg daily. Med Adherence: [x]  Yes    []  No Medication side effects: []  Yes    [x]  No Adherence with salt restriction: [x]  Yes    []  No Home Monitoring?: [x]  Yes    []  No Monitoring Frequency:  during HD Home BP results range:  SOB? []  Yes    [x]  No Chest Pain?: [x]  Yes with exertion.  Used SL Nitro 2 x last mth   Leg swelling?: []  Yes    [x]  No Headaches?: []  Yes    [x]  No Dizziness? []  Yes    [x]  No Comments: Taking and tolerating atorvastatin 80 mg daily.  Last LDL was 74.  ESRD:  Still on dialysis q. Monday Wednesday and Fridays. Now on the transplant list through [facility] transplant clinic.  Patient Active Problem List   Diagnosis Date Noted   ESRD on hemodialysis (HCC) 11/28/2020   Influenza vaccine refused 03/18/2020   Tachy-brady syndrome (HCC) 07/06/2019   Bradycardia 09/25/2018   Colon polyps 08/14/2018   Anemia of chronic renal failure 06/04/2018   Secondary hyperparathyroidism of renal origin (HCC) 06/04/2018   Chronic bilateral low back pain without sciatica 12/22/2015   Obesity (BMI 30-39.9) 06/16/2015   SOB (shortness of breath) 09/27/2014   Left shoulder pain 09/08/2014   Coronary artery disease of bypass graft of native heart with stable angina  pectoris (HCC)    Ischemic cardiomyopathy    Chronic combined systolic and diastolic CHF (congestive heart failure) (HCC)    Tobacco abuse, in remission    Constipation 01/11/2014   S/P CABG x 4 01/01/2014   Essential hypertension 05/08/2012   Microhematuria 05/08/2012   Proteinuria 05/08/2012     Current Outpatient Medications on File Prior to Visit  Medication Sig Dispense Refill   aspirin EC 81 MG tablet Take 1 tablet (81 mg total) by mouth daily. 90 tablet 3   atorvastatin (LIPITOR) 80 MG tablet Take 80 mg by mouth daily.     Blood Pressure Monitor DEVI Use as directed to check home blood pressure 2-3 times a week 1 Device 0   cholecalciferol (VITAMIN D3) 25 MCG (1000 UNIT) tablet Take 1,000 Units by mouth daily.     cinacalcet (SENSIPAR) 30 MG tablet Take 30 mg by mouth daily.     sucroferric oxyhydroxide (VELPHORO) 500 MG chewable tablet Chew 500-1,500 mg by mouth See admin instructions. Take 1500 mg with each meal and 500 mg with each snack     No current facility-administered medications on file prior to visit.    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   From [place], originally.   Moved to [place] in 2012.    Social Determinants of Health   Financial Resource Strain: Low Risk  (03/30/2022)   Overall Financial Resource Strain (CARDIA)    Difficulty of Paying Living Expenses: Not hard at all  Food Insecurity: No Food Insecurity (03/30/2022)   Hunger Vital Sign    Worried About Running Out of Food in the Last Year: Never true    Ran Out of Food in the Last Year: Never true  Transportation Needs: No Transportation Needs (03/30/2022)   PRAPARE - Administrator, Civil Service (Medical): No    Lack of Transportation (Non-Medical): No  Physical Activity: Insufficiently Active (03/30/2022)   Exercise Vital Sign    Days of Exercise per Week: 1 day    Minutes of Exercise per Session: 30 min  Stress: No Stress Concern Present (03/30/2022)   [employer] of Occupational Health - Occupational Stress Questionnaire    Feeling of Stress : Not at all  Social Connections: Socially Isolated (03/30/2022)   Social Connection and Isolation Panel [NHANES]    Frequency of Communication with Friends and Family: More than three times a week    Frequency of Social Gatherings with Friends and Family: Once a week    Attends Religious Services: Never    Database administrator or Organizations: No    Attends Banker Meetings: Never    Marital Status: Widowed  Catering manager Violence: Not on file    Family History  Problem Relation Age of Onset   Heart disease Mother 49   Hypertension Father 97   Asthma Father    Diabetes Paternal Grandfather    Cancer Neg Hx     Past Surgical History:  Procedure Laterality Date   AV FISTULA PLACEMENT Right 08/15/2020   Procedure: RIGHT ARM RADIOCEPHALIC ARTERIOVENOUS (AV) FISTULA CREATION;  Surgeon: Leonie Douglas, MD;  Location: [facility] OR;  Service: Vascular;   Laterality: Right;   BIOPSY  10/17/2021   Procedure: BIOPSY;  Surgeon: Kathi Der, MD;  Location: [facility] ENDOSCOPY;  Service: Gastroenterology;;   CARDIAC CATHETERIZATION     COLONOSCOPY WITH PROPOFOL N/A 10/17/2021   Procedure: COLONOSCOPY WITH PROPOFOL;  Surgeon: Kathi Der, MD;  Location: [facility] ENDOSCOPY;  Service: Gastroenterology;  Laterality: N/A;   CORONARY ARTERY BYPASS GRAFT N/A 01/01/2014   Procedure: CORONARY ARTERY BYPASS GRAFTING (CABG) times four using left internal mammary artery and right  saphenous vein;  Surgeon: Alleen Borne, MD;  Location: [facility] OR;  Service: Open Heart Surgery;  Laterality: N/A;   INTRAOPERATIVE TRANSESOPHAGEAL ECHOCARDIOGRAM N/A 01/01/2014   Procedure: INTRAOPERATIVE TRANSESOPHAGEAL ECHOCARDIOGRAM;  Surgeon: Alleen Borne, MD;  Location: [facility] OR;  Service: Open Heart Surgery;  Laterality: N/A;   LEFT HEART CATHETERIZATION WITH CORONARY ANGIOGRAM N/A 12/29/2013   Procedure: LEFT HEART CATHETERIZATION WITH CORONARY ANGIOGRAM;  Surgeon: Lesleigh Noe, MD;  Location: [facility] CATH LAB;  Service: Cardiovascular;  Laterality: N/A;   none     POLYPECTOMY  10/17/2021   Procedure: POLYPECTOMY;  Surgeon: Kathi Der, MD;  Location: [facility] ENDOSCOPY;  Service: Gastroenterology;;    ROS: Review of Systems Negative except as stated above  PHYSICAL EXAM: BP 107/71 (BP Location: Left Arm, Patient Position: Sitting, Cuff Size: Normal)   Pulse 67   Temp 98.5 F (36.9 C) (Oral)   Ht 5\' 10"  (1.778 m)   Wt 229 lb (103.9 kg)   SpO2 96%   BMI 32.86 kg/m   Physical Exam   General appearance - alert, well appearing, middle age AAM and in no distress Mental status - normal mood, behavior, speech, dress, motor activity, and thought processes Neck - supple, no significant adenopathy Chest - clear to auscultation, no wheezes, rales or rhonchi, symmetric air entry Heart - normal rate, regular rhythm, normal S1, S2, no murmurs, rubs, clicks or gallops Extremities -  peripheral pulses normal, no pedal edema, no clubbing or cyanosis Dialysis graft in the right forearm with mild thrill palpated.    Latest Ref Rng & Units 10/17/2021    6:50 AM 03/18/2020   10:19 AM 07/06/2019   10:33 AM  CMP  Glucose 70 - 99 mg/dL 92   161   BUN 6 - 20 mg/dL 31   26   Creatinine 0.96 - 1.24 mg/dL 04.54   0.98   Sodium 119 - 145 mmol/L 137   142   Potassium 3.5 - 5.1 mmol/L 3.4  4.8  4.5   Chloride 98 - 111 mmol/L 93   106   CO2 20 - 29 mmol/L   21   Calcium 8.7 - 10.2 mg/dL   9.2   Total Protein 6.0 - 8.5 g/dL  6.9    Total Bilirubin 0.0 - 1.2 mg/dL  0.2    Alkaline Phos 44 - 121 IU/L  108    AST 0 - 40 IU/L  14    ALT 0 - 44 IU/L  24     Lipid Panel     Component Value Date/Time   CHOL 125 11/16/2021 0914   TRIG 122 11/16/2021 0914   HDL 29 (L) 11/16/2021 0914   CHOLHDL 4.3 11/16/2021 0914   CHOLHDL 4.9 04/14/2015 0943   VLDL 22 04/14/2015 0943   LDLCALC 74 11/16/2021 0914   LDLDIRECT 198 (H) 05/29/2012 1744    CBC    Component Value Date/Time   WBC 8.8 03/18/2020 1019   WBC 8.0 08/16/2014 1147   RBC 4.60 03/18/2020 1019   RBC 4.97 08/16/2014 1147   HGB 13.3 10/17/2021 0650   HGB 12.6 (L) 03/18/2020 1019   HCT 39.0 10/17/2021 0650   HCT 38.5 03/18/2020 1019   PLT 289 03/18/2020 1019   MCV 84 03/18/2020 1019   MCH 27.4 03/18/2020 1019   MCH 27.6 08/16/2014 1147   MCHC 32.7 03/18/2020 1019   MCHC 33.5 08/16/2014 1147   RDW 14.5 03/18/2020 1019   LYMPHSABS 3.5 12/31/2013 0315  MONOABS 1.1 (H) 12/31/2013 0315   EOSABS 0.3 12/31/2013 0315   BASOSABS 0.0 12/31/2013 0315    ASSESSMENT AND PLAN: 1. Essential hypertension At goal.  Continue metoprolol and furosemide. - metoprolol tartrate (LOPRESSOR) 25 MG tablet; Take 1 tablet (25 mg total) by mouth 2 (two) times daily.  Dispense: 180 tablet; Refill: 1  2. ESRD on hemodialysis [facility]) Currently on transplant list through [facility]  3. Chronic combined systolic and diastolic CHF  (congestive heart failure) (HCC) Stable and compensated.  Continue metoprolol and furosemide - furosemide (LASIX) 40 MG tablet; Take 1 tablet (40 mg total) by mouth daily.  Dispense: 90 tablet; Refill: 1  4. Coronary artery disease of bypass graft of native heart with stable angina pectoris (HCC) Continue aspirin, Plavix, metoprolol and atorvastatin - clopidogrel (PLAVIX) 75 MG tablet; Take 1 tablet (75 mg total) by mouth daily.  Dispense: 90 tablet; Refill: 1 - metoprolol tartrate (LOPRESSOR) 25 MG tablet; Take 1 tablet (25 mg total) by mouth 2 (two) times daily.  Dispense: 180 tablet; Refill: 1 - nitroGLYCERIN (NITROSTAT) 0.4 MG SL tablet; Place 1 tablet (0.4 mg total) under the tongue every 5 (five) minutes as needed for chest pain.  Dispense: 90 tablet; Refill: 3  5. Secondary hyperparathyroidism, renal (HCC) On Sensipar 30 mg daily.     Patient was given the opportunity to ask questions.  Patient verbalized understanding of the plan and was able to repeat key elements of the plan.   This documentation was completed using Paediatric nurse.  Any transcriptional errors are unintentional.  No orders of the defined types were placed in this encounter.    Requested Prescriptions   Signed Prescriptions Disp Refills   clopidogrel (PLAVIX) 75 MG tablet 90 tablet 1    Sig: Take 1 tablet (75 mg total) by mouth daily.   furosemide (LASIX) 40 MG tablet 90 tablet 1    Sig: Take 1 tablet (40 mg total) by mouth daily.   metoprolol tartrate (LOPRESSOR) 25 MG tablet 180 tablet 1    Sig: Take 1 tablet (25 mg total) by mouth 2 (two) times daily.   nitroGLYCERIN (NITROSTAT) 0.4 MG SL tablet 90 tablet 3    Sig: Place 1 tablet (0.4 mg total) under the tongue every 5 (five) minutes as needed for chest pain.    Return in about 4 months (around 10/27/2022).  Jonah Blue, MD, FACP

## 2022-06-27 DIAGNOSIS — Z992 Dependence on renal dialysis: Secondary | ICD-10-CM | POA: Diagnosis not present

## 2022-06-27 DIAGNOSIS — N186 End stage renal disease: Secondary | ICD-10-CM | POA: Diagnosis not present

## 2022-06-27 DIAGNOSIS — N2581 Secondary hyperparathyroidism of renal origin: Secondary | ICD-10-CM | POA: Diagnosis not present

## 2022-06-28 ENCOUNTER — Telehealth: Payer: Self-pay | Admitting: Internal Medicine

## 2022-06-28 MED ORDER — CINACALCET HCL 30 MG PO TABS: 30.0000 mg | ORAL_TABLET | Freq: Every day | ORAL | 1 refills | Status: DC

## 2022-06-28 NOTE — Addendum Note (Signed)
Addended by: Jonah Blue B on: 06/28/2022 01:28 PM   Modules accepted: Orders

## 2022-06-28 NOTE — Telephone Encounter (Signed)
Copied from CRM [id number]. Topic: General - Inquiry >> Jun 28, 2022  8:56 AM Haroldine Laws wrote: Reason for CRM: pt called saying he was in on the 7th and thought the provider was going to send a new prescription to the pharmacy.  Cinacalcet hcl 30 mg   CB@  [phone]  [facility] [address]

## 2022-06-29 DIAGNOSIS — N2581 Secondary hyperparathyroidism of renal origin: Secondary | ICD-10-CM | POA: Diagnosis not present

## 2022-06-29 DIAGNOSIS — N186 End stage renal disease: Secondary | ICD-10-CM | POA: Diagnosis not present

## 2022-06-29 DIAGNOSIS — Z992 Dependence on renal dialysis: Secondary | ICD-10-CM | POA: Diagnosis not present

## 2022-06-29 NOTE — Telephone Encounter (Signed)
Called & spoke to the patient. Verified name & DOB. Informed that prescription has been sent to the pharmacy. Patient expressed verbal understanding. No further questions at this time.

## 2022-07-02 DIAGNOSIS — N186 End stage renal disease: Secondary | ICD-10-CM | POA: Diagnosis not present

## 2022-07-02 DIAGNOSIS — Z992 Dependence on renal dialysis: Secondary | ICD-10-CM | POA: Diagnosis not present

## 2022-07-02 DIAGNOSIS — N2581 Secondary hyperparathyroidism of renal origin: Secondary | ICD-10-CM | POA: Diagnosis not present

## 2022-07-04 DIAGNOSIS — Z992 Dependence on renal dialysis: Secondary | ICD-10-CM | POA: Diagnosis not present

## 2022-07-04 DIAGNOSIS — N186 End stage renal disease: Secondary | ICD-10-CM | POA: Diagnosis not present

## 2022-07-04 DIAGNOSIS — N2581 Secondary hyperparathyroidism of renal origin: Secondary | ICD-10-CM | POA: Diagnosis not present

## 2022-07-06 DIAGNOSIS — Z992 Dependence on renal dialysis: Secondary | ICD-10-CM | POA: Diagnosis not present

## 2022-07-06 DIAGNOSIS — N2581 Secondary hyperparathyroidism of renal origin: Secondary | ICD-10-CM | POA: Diagnosis not present

## 2022-07-06 DIAGNOSIS — N186 End stage renal disease: Secondary | ICD-10-CM | POA: Diagnosis not present

## 2022-07-09 DIAGNOSIS — N186 End stage renal disease: Secondary | ICD-10-CM | POA: Diagnosis not present

## 2022-07-09 DIAGNOSIS — Z992 Dependence on renal dialysis: Secondary | ICD-10-CM | POA: Diagnosis not present

## 2022-07-09 DIAGNOSIS — N2581 Secondary hyperparathyroidism of renal origin: Secondary | ICD-10-CM | POA: Diagnosis not present

## 2022-07-11 DIAGNOSIS — N186 End stage renal disease: Secondary | ICD-10-CM | POA: Diagnosis not present

## 2022-07-11 DIAGNOSIS — N2581 Secondary hyperparathyroidism of renal origin: Secondary | ICD-10-CM | POA: Diagnosis not present

## 2022-07-11 DIAGNOSIS — Z992 Dependence on renal dialysis: Secondary | ICD-10-CM | POA: Diagnosis not present

## 2022-07-13 DIAGNOSIS — Z992 Dependence on renal dialysis: Secondary | ICD-10-CM | POA: Diagnosis not present

## 2022-07-13 DIAGNOSIS — N2581 Secondary hyperparathyroidism of renal origin: Secondary | ICD-10-CM | POA: Diagnosis not present

## 2022-07-13 DIAGNOSIS — N186 End stage renal disease: Secondary | ICD-10-CM | POA: Diagnosis not present

## 2022-07-16 DIAGNOSIS — Z992 Dependence on renal dialysis: Secondary | ICD-10-CM | POA: Diagnosis not present

## 2022-07-16 DIAGNOSIS — N2581 Secondary hyperparathyroidism of renal origin: Secondary | ICD-10-CM | POA: Diagnosis not present

## 2022-07-16 DIAGNOSIS — N186 End stage renal disease: Secondary | ICD-10-CM | POA: Diagnosis not present

## 2022-07-18 DIAGNOSIS — N186 End stage renal disease: Secondary | ICD-10-CM | POA: Diagnosis not present

## 2022-07-18 DIAGNOSIS — Z992 Dependence on renal dialysis: Secondary | ICD-10-CM | POA: Diagnosis not present

## 2022-07-18 DIAGNOSIS — N2581 Secondary hyperparathyroidism of renal origin: Secondary | ICD-10-CM | POA: Diagnosis not present

## 2022-07-20 DIAGNOSIS — Z992 Dependence on renal dialysis: Secondary | ICD-10-CM | POA: Diagnosis not present

## 2022-07-20 DIAGNOSIS — N186 End stage renal disease: Secondary | ICD-10-CM | POA: Diagnosis not present

## 2022-07-20 DIAGNOSIS — N2581 Secondary hyperparathyroidism of renal origin: Secondary | ICD-10-CM | POA: Diagnosis not present

## 2022-07-20 DIAGNOSIS — I129 Hypertensive chronic kidney disease with stage 1 through stage 4 chronic kidney disease, or unspecified chronic kidney disease: Secondary | ICD-10-CM | POA: Diagnosis not present

## 2022-07-23 DIAGNOSIS — N2581 Secondary hyperparathyroidism of renal origin: Secondary | ICD-10-CM | POA: Diagnosis not present

## 2022-07-23 DIAGNOSIS — Z992 Dependence on renal dialysis: Secondary | ICD-10-CM | POA: Diagnosis not present

## 2022-07-23 DIAGNOSIS — N186 End stage renal disease: Secondary | ICD-10-CM | POA: Diagnosis not present

## 2022-07-25 DIAGNOSIS — Z992 Dependence on renal dialysis: Secondary | ICD-10-CM | POA: Diagnosis not present

## 2022-07-25 DIAGNOSIS — N2581 Secondary hyperparathyroidism of renal origin: Secondary | ICD-10-CM | POA: Diagnosis not present

## 2022-07-25 DIAGNOSIS — N186 End stage renal disease: Secondary | ICD-10-CM | POA: Diagnosis not present

## 2022-07-27 DIAGNOSIS — Z992 Dependence on renal dialysis: Secondary | ICD-10-CM | POA: Diagnosis not present

## 2022-07-27 DIAGNOSIS — N2581 Secondary hyperparathyroidism of renal origin: Secondary | ICD-10-CM | POA: Diagnosis not present

## 2022-07-27 DIAGNOSIS — N186 End stage renal disease: Secondary | ICD-10-CM | POA: Diagnosis not present

## 2022-07-30 DIAGNOSIS — Z992 Dependence on renal dialysis: Secondary | ICD-10-CM | POA: Diagnosis not present

## 2022-07-30 DIAGNOSIS — N2581 Secondary hyperparathyroidism of renal origin: Secondary | ICD-10-CM | POA: Diagnosis not present

## 2022-07-30 DIAGNOSIS — N186 End stage renal disease: Secondary | ICD-10-CM | POA: Diagnosis not present

## 2022-08-01 DIAGNOSIS — N2581 Secondary hyperparathyroidism of renal origin: Secondary | ICD-10-CM | POA: Diagnosis not present

## 2022-08-01 DIAGNOSIS — N186 End stage renal disease: Secondary | ICD-10-CM | POA: Diagnosis not present

## 2022-08-01 DIAGNOSIS — Z992 Dependence on renal dialysis: Secondary | ICD-10-CM | POA: Diagnosis not present

## 2022-08-03 DIAGNOSIS — Z992 Dependence on renal dialysis: Secondary | ICD-10-CM | POA: Diagnosis not present

## 2022-08-03 DIAGNOSIS — N186 End stage renal disease: Secondary | ICD-10-CM | POA: Diagnosis not present

## 2022-08-03 DIAGNOSIS — N2581 Secondary hyperparathyroidism of renal origin: Secondary | ICD-10-CM | POA: Diagnosis not present

## 2022-08-06 DIAGNOSIS — N186 End stage renal disease: Secondary | ICD-10-CM | POA: Diagnosis not present

## 2022-08-06 DIAGNOSIS — N2581 Secondary hyperparathyroidism of renal origin: Secondary | ICD-10-CM | POA: Diagnosis not present

## 2022-08-06 DIAGNOSIS — Z992 Dependence on renal dialysis: Secondary | ICD-10-CM | POA: Diagnosis not present

## 2022-08-08 DIAGNOSIS — N2581 Secondary hyperparathyroidism of renal origin: Secondary | ICD-10-CM | POA: Diagnosis not present

## 2022-08-08 DIAGNOSIS — N186 End stage renal disease: Secondary | ICD-10-CM | POA: Diagnosis not present

## 2022-08-08 DIAGNOSIS — Z992 Dependence on renal dialysis: Secondary | ICD-10-CM | POA: Diagnosis not present

## 2022-08-10 DIAGNOSIS — N186 End stage renal disease: Secondary | ICD-10-CM | POA: Diagnosis not present

## 2022-08-10 DIAGNOSIS — N2581 Secondary hyperparathyroidism of renal origin: Secondary | ICD-10-CM | POA: Diagnosis not present

## 2022-08-10 DIAGNOSIS — Z992 Dependence on renal dialysis: Secondary | ICD-10-CM | POA: Diagnosis not present

## 2022-08-13 DIAGNOSIS — N2581 Secondary hyperparathyroidism of renal origin: Secondary | ICD-10-CM | POA: Diagnosis not present

## 2022-08-13 DIAGNOSIS — Z992 Dependence on renal dialysis: Secondary | ICD-10-CM | POA: Diagnosis not present

## 2022-08-13 DIAGNOSIS — N186 End stage renal disease: Secondary | ICD-10-CM | POA: Diagnosis not present

## 2022-08-15 DIAGNOSIS — N186 End stage renal disease: Secondary | ICD-10-CM | POA: Diagnosis not present

## 2022-08-15 DIAGNOSIS — N2581 Secondary hyperparathyroidism of renal origin: Secondary | ICD-10-CM | POA: Diagnosis not present

## 2022-08-15 DIAGNOSIS — Z992 Dependence on renal dialysis: Secondary | ICD-10-CM | POA: Diagnosis not present

## 2022-08-17 DIAGNOSIS — Z992 Dependence on renal dialysis: Secondary | ICD-10-CM | POA: Diagnosis not present

## 2022-08-17 DIAGNOSIS — N2581 Secondary hyperparathyroidism of renal origin: Secondary | ICD-10-CM | POA: Diagnosis not present

## 2022-08-17 DIAGNOSIS — N186 End stage renal disease: Secondary | ICD-10-CM | POA: Diagnosis not present

## 2022-08-19 DIAGNOSIS — I129 Hypertensive chronic kidney disease with stage 1 through stage 4 chronic kidney disease, or unspecified chronic kidney disease: Secondary | ICD-10-CM | POA: Diagnosis not present

## 2022-08-19 DIAGNOSIS — N186 End stage renal disease: Secondary | ICD-10-CM | POA: Diagnosis not present

## 2022-08-19 DIAGNOSIS — Z992 Dependence on renal dialysis: Secondary | ICD-10-CM | POA: Diagnosis not present

## 2022-08-20 DIAGNOSIS — Z992 Dependence on renal dialysis: Secondary | ICD-10-CM | POA: Diagnosis not present

## 2022-08-20 DIAGNOSIS — N186 End stage renal disease: Secondary | ICD-10-CM | POA: Diagnosis not present

## 2022-08-20 DIAGNOSIS — N2581 Secondary hyperparathyroidism of renal origin: Secondary | ICD-10-CM | POA: Diagnosis not present

## 2022-08-22 DIAGNOSIS — N2581 Secondary hyperparathyroidism of renal origin: Secondary | ICD-10-CM | POA: Diagnosis not present

## 2022-08-22 DIAGNOSIS — N186 End stage renal disease: Secondary | ICD-10-CM | POA: Diagnosis not present

## 2022-08-22 DIAGNOSIS — Z992 Dependence on renal dialysis: Secondary | ICD-10-CM | POA: Diagnosis not present

## 2022-08-24 DIAGNOSIS — N2581 Secondary hyperparathyroidism of renal origin: Secondary | ICD-10-CM | POA: Diagnosis not present

## 2022-08-24 DIAGNOSIS — N186 End stage renal disease: Secondary | ICD-10-CM | POA: Diagnosis not present

## 2022-08-24 DIAGNOSIS — Z992 Dependence on renal dialysis: Secondary | ICD-10-CM | POA: Diagnosis not present

## 2022-08-27 DIAGNOSIS — Z992 Dependence on renal dialysis: Secondary | ICD-10-CM | POA: Diagnosis not present

## 2022-08-27 DIAGNOSIS — N186 End stage renal disease: Secondary | ICD-10-CM | POA: Diagnosis not present

## 2022-08-27 DIAGNOSIS — N2581 Secondary hyperparathyroidism of renal origin: Secondary | ICD-10-CM | POA: Diagnosis not present

## 2022-08-29 DIAGNOSIS — N186 End stage renal disease: Secondary | ICD-10-CM | POA: Diagnosis not present

## 2022-08-29 DIAGNOSIS — N2581 Secondary hyperparathyroidism of renal origin: Secondary | ICD-10-CM | POA: Diagnosis not present

## 2022-08-29 DIAGNOSIS — Z992 Dependence on renal dialysis: Secondary | ICD-10-CM | POA: Diagnosis not present

## 2022-08-31 DIAGNOSIS — N2581 Secondary hyperparathyroidism of renal origin: Secondary | ICD-10-CM | POA: Diagnosis not present

## 2022-08-31 DIAGNOSIS — N186 End stage renal disease: Secondary | ICD-10-CM | POA: Diagnosis not present

## 2022-08-31 DIAGNOSIS — Z992 Dependence on renal dialysis: Secondary | ICD-10-CM | POA: Diagnosis not present

## 2022-09-03 DIAGNOSIS — N186 End stage renal disease: Secondary | ICD-10-CM | POA: Diagnosis not present

## 2022-09-03 DIAGNOSIS — N2581 Secondary hyperparathyroidism of renal origin: Secondary | ICD-10-CM | POA: Diagnosis not present

## 2022-09-03 DIAGNOSIS — Z992 Dependence on renal dialysis: Secondary | ICD-10-CM | POA: Diagnosis not present

## 2022-09-05 DIAGNOSIS — N186 End stage renal disease: Secondary | ICD-10-CM | POA: Diagnosis not present

## 2022-09-05 DIAGNOSIS — Z992 Dependence on renal dialysis: Secondary | ICD-10-CM | POA: Diagnosis not present

## 2022-09-05 DIAGNOSIS — N2581 Secondary hyperparathyroidism of renal origin: Secondary | ICD-10-CM | POA: Diagnosis not present

## 2022-09-07 DIAGNOSIS — N2581 Secondary hyperparathyroidism of renal origin: Secondary | ICD-10-CM | POA: Diagnosis not present

## 2022-09-07 DIAGNOSIS — Z992 Dependence on renal dialysis: Secondary | ICD-10-CM | POA: Diagnosis not present

## 2022-09-07 DIAGNOSIS — N186 End stage renal disease: Secondary | ICD-10-CM | POA: Diagnosis not present

## 2022-09-10 DIAGNOSIS — N2581 Secondary hyperparathyroidism of renal origin: Secondary | ICD-10-CM | POA: Diagnosis not present

## 2022-09-10 DIAGNOSIS — N186 End stage renal disease: Secondary | ICD-10-CM | POA: Diagnosis not present

## 2022-09-10 DIAGNOSIS — Z992 Dependence on renal dialysis: Secondary | ICD-10-CM | POA: Diagnosis not present

## 2022-09-12 DIAGNOSIS — N2581 Secondary hyperparathyroidism of renal origin: Secondary | ICD-10-CM | POA: Diagnosis not present

## 2022-09-12 DIAGNOSIS — N186 End stage renal disease: Secondary | ICD-10-CM | POA: Diagnosis not present

## 2022-09-12 DIAGNOSIS — Z992 Dependence on renal dialysis: Secondary | ICD-10-CM | POA: Diagnosis not present

## 2022-09-14 DIAGNOSIS — Z992 Dependence on renal dialysis: Secondary | ICD-10-CM | POA: Diagnosis not present

## 2022-09-14 DIAGNOSIS — N2581 Secondary hyperparathyroidism of renal origin: Secondary | ICD-10-CM | POA: Diagnosis not present

## 2022-09-14 DIAGNOSIS — N186 End stage renal disease: Secondary | ICD-10-CM | POA: Diagnosis not present

## 2022-09-17 DIAGNOSIS — N2581 Secondary hyperparathyroidism of renal origin: Secondary | ICD-10-CM | POA: Diagnosis not present

## 2022-09-17 DIAGNOSIS — N186 End stage renal disease: Secondary | ICD-10-CM | POA: Diagnosis not present

## 2022-09-17 DIAGNOSIS — Z992 Dependence on renal dialysis: Secondary | ICD-10-CM | POA: Diagnosis not present

## 2022-09-19 DIAGNOSIS — N2581 Secondary hyperparathyroidism of renal origin: Secondary | ICD-10-CM | POA: Diagnosis not present

## 2022-09-19 DIAGNOSIS — N186 End stage renal disease: Secondary | ICD-10-CM | POA: Diagnosis not present

## 2022-09-19 DIAGNOSIS — I129 Hypertensive chronic kidney disease with stage 1 through stage 4 chronic kidney disease, or unspecified chronic kidney disease: Secondary | ICD-10-CM | POA: Diagnosis not present

## 2022-09-19 DIAGNOSIS — Z992 Dependence on renal dialysis: Secondary | ICD-10-CM | POA: Diagnosis not present

## 2022-09-21 DIAGNOSIS — N2581 Secondary hyperparathyroidism of renal origin: Secondary | ICD-10-CM | POA: Diagnosis not present

## 2022-09-21 DIAGNOSIS — Z992 Dependence on renal dialysis: Secondary | ICD-10-CM | POA: Diagnosis not present

## 2022-09-21 DIAGNOSIS — N186 End stage renal disease: Secondary | ICD-10-CM | POA: Diagnosis not present

## 2022-09-24 DIAGNOSIS — Z992 Dependence on renal dialysis: Secondary | ICD-10-CM | POA: Diagnosis not present

## 2022-09-24 DIAGNOSIS — N186 End stage renal disease: Secondary | ICD-10-CM | POA: Diagnosis not present

## 2022-09-24 DIAGNOSIS — N2581 Secondary hyperparathyroidism of renal origin: Secondary | ICD-10-CM | POA: Diagnosis not present

## 2022-09-26 DIAGNOSIS — N186 End stage renal disease: Secondary | ICD-10-CM | POA: Diagnosis not present

## 2022-09-26 DIAGNOSIS — Z992 Dependence on renal dialysis: Secondary | ICD-10-CM | POA: Diagnosis not present

## 2022-09-26 DIAGNOSIS — N2581 Secondary hyperparathyroidism of renal origin: Secondary | ICD-10-CM | POA: Diagnosis not present

## 2022-09-28 DIAGNOSIS — N186 End stage renal disease: Secondary | ICD-10-CM | POA: Diagnosis not present

## 2022-09-28 DIAGNOSIS — Z992 Dependence on renal dialysis: Secondary | ICD-10-CM | POA: Diagnosis not present

## 2022-09-28 DIAGNOSIS — N2581 Secondary hyperparathyroidism of renal origin: Secondary | ICD-10-CM | POA: Diagnosis not present

## 2022-10-01 DIAGNOSIS — Z992 Dependence on renal dialysis: Secondary | ICD-10-CM | POA: Diagnosis not present

## 2022-10-01 DIAGNOSIS — N2581 Secondary hyperparathyroidism of renal origin: Secondary | ICD-10-CM | POA: Diagnosis not present

## 2022-10-01 DIAGNOSIS — N186 End stage renal disease: Secondary | ICD-10-CM | POA: Diagnosis not present

## 2022-10-03 DIAGNOSIS — N186 End stage renal disease: Secondary | ICD-10-CM | POA: Diagnosis not present

## 2022-10-03 DIAGNOSIS — Z992 Dependence on renal dialysis: Secondary | ICD-10-CM | POA: Diagnosis not present

## 2022-10-03 DIAGNOSIS — N2581 Secondary hyperparathyroidism of renal origin: Secondary | ICD-10-CM | POA: Diagnosis not present

## 2022-10-05 DIAGNOSIS — N186 End stage renal disease: Secondary | ICD-10-CM | POA: Diagnosis not present

## 2022-10-05 DIAGNOSIS — Z992 Dependence on renal dialysis: Secondary | ICD-10-CM | POA: Diagnosis not present

## 2022-10-05 DIAGNOSIS — N2581 Secondary hyperparathyroidism of renal origin: Secondary | ICD-10-CM | POA: Diagnosis not present

## 2022-10-08 DIAGNOSIS — Z992 Dependence on renal dialysis: Secondary | ICD-10-CM | POA: Diagnosis not present

## 2022-10-08 DIAGNOSIS — N186 End stage renal disease: Secondary | ICD-10-CM | POA: Diagnosis not present

## 2022-10-08 DIAGNOSIS — N2581 Secondary hyperparathyroidism of renal origin: Secondary | ICD-10-CM | POA: Diagnosis not present

## 2022-10-10 DIAGNOSIS — Z992 Dependence on renal dialysis: Secondary | ICD-10-CM | POA: Diagnosis not present

## 2022-10-10 DIAGNOSIS — N186 End stage renal disease: Secondary | ICD-10-CM | POA: Diagnosis not present

## 2022-10-10 DIAGNOSIS — N2581 Secondary hyperparathyroidism of renal origin: Secondary | ICD-10-CM | POA: Diagnosis not present

## 2022-10-12 DIAGNOSIS — Z992 Dependence on renal dialysis: Secondary | ICD-10-CM | POA: Diagnosis not present

## 2022-10-12 DIAGNOSIS — N2581 Secondary hyperparathyroidism of renal origin: Secondary | ICD-10-CM | POA: Diagnosis not present

## 2022-10-12 DIAGNOSIS — N186 End stage renal disease: Secondary | ICD-10-CM | POA: Diagnosis not present

## 2022-10-15 DIAGNOSIS — Z992 Dependence on renal dialysis: Secondary | ICD-10-CM | POA: Diagnosis not present

## 2022-10-15 DIAGNOSIS — N2581 Secondary hyperparathyroidism of renal origin: Secondary | ICD-10-CM | POA: Diagnosis not present

## 2022-10-15 DIAGNOSIS — N186 End stage renal disease: Secondary | ICD-10-CM | POA: Diagnosis not present

## 2022-10-16 DIAGNOSIS — I2510 Atherosclerotic heart disease of native coronary artery without angina pectoris: Secondary | ICD-10-CM | POA: Diagnosis not present

## 2022-10-16 DIAGNOSIS — I10 Essential (primary) hypertension: Secondary | ICD-10-CM | POA: Diagnosis not present

## 2022-10-17 DIAGNOSIS — N2581 Secondary hyperparathyroidism of renal origin: Secondary | ICD-10-CM | POA: Diagnosis not present

## 2022-10-17 DIAGNOSIS — Z992 Dependence on renal dialysis: Secondary | ICD-10-CM | POA: Diagnosis not present

## 2022-10-17 DIAGNOSIS — N186 End stage renal disease: Secondary | ICD-10-CM | POA: Diagnosis not present

## 2022-10-19 DIAGNOSIS — Z992 Dependence on renal dialysis: Secondary | ICD-10-CM | POA: Diagnosis not present

## 2022-10-19 DIAGNOSIS — N186 End stage renal disease: Secondary | ICD-10-CM | POA: Diagnosis not present

## 2022-10-19 DIAGNOSIS — N2581 Secondary hyperparathyroidism of renal origin: Secondary | ICD-10-CM | POA: Diagnosis not present

## 2022-10-20 DIAGNOSIS — Z992 Dependence on renal dialysis: Secondary | ICD-10-CM | POA: Diagnosis not present

## 2022-10-20 DIAGNOSIS — I129 Hypertensive chronic kidney disease with stage 1 through stage 4 chronic kidney disease, or unspecified chronic kidney disease: Secondary | ICD-10-CM | POA: Diagnosis not present

## 2022-10-20 DIAGNOSIS — N186 End stage renal disease: Secondary | ICD-10-CM | POA: Diagnosis not present

## 2022-10-22 DIAGNOSIS — N186 End stage renal disease: Secondary | ICD-10-CM | POA: Diagnosis not present

## 2022-10-22 DIAGNOSIS — Z992 Dependence on renal dialysis: Secondary | ICD-10-CM | POA: Diagnosis not present

## 2022-10-22 DIAGNOSIS — N2581 Secondary hyperparathyroidism of renal origin: Secondary | ICD-10-CM | POA: Diagnosis not present

## 2022-10-24 DIAGNOSIS — Z992 Dependence on renal dialysis: Secondary | ICD-10-CM | POA: Diagnosis not present

## 2022-10-24 DIAGNOSIS — N186 End stage renal disease: Secondary | ICD-10-CM | POA: Diagnosis not present

## 2022-10-24 DIAGNOSIS — N2581 Secondary hyperparathyroidism of renal origin: Secondary | ICD-10-CM | POA: Diagnosis not present

## 2022-10-26 DIAGNOSIS — N186 End stage renal disease: Secondary | ICD-10-CM | POA: Diagnosis not present

## 2022-10-26 DIAGNOSIS — Z992 Dependence on renal dialysis: Secondary | ICD-10-CM | POA: Diagnosis not present

## 2022-10-26 DIAGNOSIS — N2581 Secondary hyperparathyroidism of renal origin: Secondary | ICD-10-CM | POA: Diagnosis not present

## 2022-10-29 DIAGNOSIS — Z992 Dependence on renal dialysis: Secondary | ICD-10-CM | POA: Diagnosis not present

## 2022-10-29 DIAGNOSIS — N2581 Secondary hyperparathyroidism of renal origin: Secondary | ICD-10-CM | POA: Diagnosis not present

## 2022-10-29 DIAGNOSIS — N186 End stage renal disease: Secondary | ICD-10-CM | POA: Diagnosis not present

## 2022-10-30 ENCOUNTER — Encounter: Payer: Self-pay | Admitting: Internal Medicine

## 2022-10-30 ENCOUNTER — Ambulatory Visit: Payer: Medicare HMO | Attending: Internal Medicine | Admitting: Internal Medicine

## 2022-10-30 VITALS — BP 107/71 | HR 66 | Temp 98.3°F | Ht 70.0 in | Wt 230.0 lb

## 2022-10-30 DIAGNOSIS — Z2821 Immunization not carried out because of patient refusal: Secondary | ICD-10-CM

## 2022-10-30 DIAGNOSIS — I25708 Atherosclerosis of coronary artery bypass graft(s), unspecified, with other forms of angina pectoris: Secondary | ICD-10-CM

## 2022-10-30 DIAGNOSIS — I10 Essential (primary) hypertension: Secondary | ICD-10-CM

## 2022-10-30 DIAGNOSIS — I5042 Chronic combined systolic (congestive) and diastolic (congestive) heart failure: Secondary | ICD-10-CM

## 2022-10-30 DIAGNOSIS — N186 End stage renal disease: Secondary | ICD-10-CM

## 2022-10-30 DIAGNOSIS — Z992 Dependence on renal dialysis: Secondary | ICD-10-CM | POA: Diagnosis not present

## 2022-10-30 DIAGNOSIS — Z23 Encounter for immunization: Secondary | ICD-10-CM

## 2022-10-30 MED ORDER — FUROSEMIDE 40 MG PO TABS: 40.0000 mg | ORAL_TABLET | Freq: Every day | ORAL | 2 refills | Status: DC

## 2022-10-30 MED ORDER — CINACALCET HCL 30 MG PO TABS: ORAL_TABLET | ORAL | 2 refills | Status: DC

## 2022-10-30 MED ORDER — CLOPIDOGREL BISULFATE 75 MG PO TABS: 75.0000 mg | ORAL_TABLET | Freq: Every day | ORAL | 2 refills | Status: DC

## 2022-10-30 MED ORDER — ZOSTER VAC RECOMB ADJUVANTED 50 MCG/0.5ML IM SUSR: 0.5000 mL | Freq: Once | INTRAMUSCULAR | 0 refills | Status: AC

## 2022-10-30 MED ORDER — METOPROLOL TARTRATE 25 MG PO TABS: 25.0000 mg | ORAL_TABLET | Freq: Two times a day (BID) | ORAL | 2 refills | Status: DC

## 2022-10-30 NOTE — Progress Notes (Signed)
Patient ID: Seth Keller, male    DOB: [birth year]  MRN: [id number]  CC: Hypertension (HTN f/u. Med refills. /No questions / concerns /No to flu vax. Yes to shingles vax )   Subjective: Seth Keller is a 57 y.o. male who presents for chronic ds management.  Seth Keller, his fiance is with him  His concerns today include:  t with hx of CAD s/p CABG x 4 (Dr. 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 Hyman Keller) on transplant list at [facility], HTN, HL, ICM but last EF 08/2016 improved to 50-55%, 2DD,  obesity.     HTN/CAD/combined CHF Currently taking: see medication list.  Compliant with metoprolol 25 mg twice a day, aspirin 81 mg daily, atorvastatin 80 mg daily, Plavix 75 mg daily and furosemide 40 mg daily. Med Adherence: [x]  Yes    []  No Medication side effects: []  Yes    [x]  No Adherence with salt restriction: [x]  Yes    []  No Home Monitoring?: [x]  Yes    []  No Monitoring Frequency:  during HD Home BP results range:  SOB? [x]  Yes  -but base line for him; no worse Chest Pain?: [x]  Yes with exertion.  Used SL Nitro few times since  last vist - CP when carrying some a little heavy   Leg swelling?: []  Yes    [x]  No Headaches?: []  Yes    [x]  No Dizziness? []  Yes    [x]  No Comments: Taking and tolerating atorvastatin 80 mg daily.  Last LDL was 74. Doing okay with eat habits and tries to stay active.  Does resistant bands stretching.   ESRD: Still on transplant list at [facility]; still going to HD 3x/wk Recently placed on Sensipar and wanted to know what it is for  HM:  declines flu shot and COVID booster.  Agrees to shingles vaccine.  Had shingles several yrs ago.  Patient Active Problem List   Diagnosis Date Noted   ESRD on hemodialysis (HCC) 11/28/2020   Influenza vaccine refused 03/18/2020   Bradycardia 09/25/2018   Colon polyps 08/14/2018   Anemia of chronic renal failure 06/04/2018   Secondary hyperparathyroidism of renal origin (HCC) 06/04/2018   Chronic bilateral low back pain without sciatica  12/22/2015   Obesity (BMI 30-39.9) 06/16/2015   SOB (shortness of breath) 09/27/2014   Left shoulder pain 09/08/2014   Coronary artery disease of bypass graft of native heart with stable angina pectoris (HCC)    Ischemic cardiomyopathy    Chronic combined systolic and diastolic CHF (congestive heart failure) (HCC)    Tobacco abuse, in remission    Constipation 01/11/2014   S/P CABG x 4 01/01/2014   Essential hypertension 05/08/2012   Microhematuria 05/08/2012   Proteinuria 05/08/2012     Current Outpatient Medications on File Prior to Visit  Medication Sig Dispense Refill   aspirin EC 81 MG tablet Take 1 tablet (81 mg total) by mouth daily. 90 tablet 3   atorvastatin (LIPITOR) 80 MG tablet Take 80 mg by mouth daily.     Blood Pressure Monitor DEVI Use as directed to check home blood pressure 2-3 times a week 1 Device 0   cholecalciferol (VITAMIN D3) 25 MCG (1000 UNIT) tablet Take 1,000 Units by mouth daily.     nitroGLYCERIN (NITROSTAT) 0.4 MG SL tablet Place 1 tablet (0.4 mg total) under the tongue every 5 (five) minutes as needed for chest pain. 90 tablet 3   sucroferric oxyhydroxide (VELPHORO) 500 MG chewable tablet Chew 500-1,500 mg  by mouth See admin instructions. Take 1500 mg with each meal and 500 mg with each snack     No current facility-administered medications on file prior to visit.    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   From [place], originally.   Moved to [place] in 2012.    Social Determinants of Health   Financial Resource Strain: Medium Risk (10/29/2022)   Overall Financial Resource Strain (CARDIA)    Difficulty of Paying Living Expenses: Somewhat hard  Food Insecurity: Food Insecurity Present (10/29/2022)   Hunger Vital Sign    Worried About Running Out of Food in the Last Year: Never true    Ran Out of Food in the Last Year: Sometimes true  Transportation Needs: No Transportation Needs (10/29/2022)   PRAPARE - Administrator, Civil Service (Medical): No    Lack of Transportation (Non-Medical): No  Physical Activity: Insufficiently Active (10/29/2022)   Exercise Vital Sign    Days of Exercise per Week: 3 days    Minutes of Exercise per Session: 20 min  Stress: No Stress Concern Present (10/29/2022)   [employer] of Occupational Health - Occupational Stress Questionnaire    Feeling of Stress : Not at all  Social Connections: Socially Isolated (10/29/2022)   Social Connection and Isolation Panel [NHANES]    Frequency of Communication with Friends and Family: More than three times a week    Frequency of Social Gatherings with Friends and Family: Patient declined    Attends Religious Services: Never    Database administrator or Organizations: No    Attends Banker Meetings: Never    Marital Status: Widowed  Catering manager Violence: Not on file    Family History  Problem Relation Age of Onset   Heart disease Mother 73    Hypertension Father 94   Asthma Father    Diabetes Paternal Grandfather    Cancer Neg Hx     Past Surgical History:  Procedure Laterality Date   AV FISTULA PLACEMENT Right 08/15/2020   Procedure: RIGHT ARM RADIOCEPHALIC ARTERIOVENOUS (AV) FISTULA CREATION;  Surgeon: Leonie Douglas, MD;  Location: [facility] OR;  Service: Vascular;  Laterality: Right;   BIOPSY  10/17/2021   Procedure: BIOPSY;  Surgeon: Kathi Der, MD;  Location: [facility];  Service: Gastroenterology;;  CARDIAC CATHETERIZATION     COLONOSCOPY WITH PROPOFOL N/A 10/17/2021   Procedure: COLONOSCOPY WITH PROPOFOL;  Surgeon: Kathi Der, MD;  Location: [facility];  Service: Gastroenterology;  Laterality: N/A;   CORONARY ARTERY BYPASS GRAFT N/A 01/01/2014   Procedure: CORONARY ARTERY BYPASS GRAFTING (CABG) times four using left internal mammary artery and right saphenous vein;  Surgeon: Alleen Borne, MD;  Location: [facility] OR;  Service: Open Heart Surgery;  Laterality: N/A;   INTRAOPERATIVE TRANSESOPHAGEAL ECHOCARDIOGRAM N/A 01/01/2014   Procedure: INTRAOPERATIVE TRANSESOPHAGEAL ECHOCARDIOGRAM;  Surgeon: Alleen Borne, MD;  Location: [facility] OR;  Service: Open Heart Surgery;  Laterality: N/A;   LEFT HEART CATHETERIZATION WITH CORONARY ANGIOGRAM N/A 12/29/2013   Procedure: LEFT HEART CATHETERIZATION WITH CORONARY ANGIOGRAM;  Surgeon: Lesleigh Noe, MD;  Location: [facility] CATH LAB;  Service: Cardiovascular;  Laterality: N/A;   none     POLYPECTOMY  10/17/2021   Procedure: POLYPECTOMY;  Surgeon: Kathi Der, MD;  Location: [facility];  Service: Gastroenterology;;    ROS: Review of Systems Negative except as stated above  PHYSICAL EXAM: BP 107/71 (BP Location: Left Arm, Patient Position: Sitting, Cuff Size: Normal)   Pulse 66   Temp 98.3 F (36.8 C) (Oral)   Ht 5\' 10"  (1.778 m)   Wt 230 lb (104.3 kg)   SpO2 96%   BMI 33.00 kg/m   Physical Exam   General appearance - alert, well appearing, older  African-American male and in no distress Mental status - normal mood, behavior, speech, dress, motor activity, and thought processes Neck - supple, no significant adenopathy Chest - clear to auscultation, no wheezes, rales or rhonchi, symmetric air entry Heart - normal rate, regular rhythm, normal S1, S2, no murmurs, rubs, clicks or gallops Extremities - peripheral pulses normal, no pedal edema, no clubbing or cyanosis     Latest Ref Rng & Units 10/17/2021    6:50 AM 03/18/2020   10:19 AM 07/06/2019   10:33 AM  CMP  Glucose 70 - 99 mg/dL 92   161   BUN 6 - 20 mg/dL 31   26   Creatinine 0.96 - 1.24 mg/dL 04.54   0.98   Sodium 119 - 145 mmol/L 137   142   Potassium 3.5 - 5.1 mmol/L 3.4  4.8  4.5   Chloride 98 - 111 mmol/L 93   106   CO2 20 - 29 mmol/L   21   Calcium 8.7 - 10.2 mg/dL   9.2   Total Protein 6.0 - 8.5 g/dL  6.9    Total Bilirubin 0.0 - 1.2 mg/dL  0.2    Alkaline Phos 44 - 121 IU/L  108    AST 0 - 40 IU/L  14    ALT 0 - 44 IU/L  24     Lipid Panel     Component Value Date/Time   CHOL 125 11/16/2021 0914   TRIG 122 11/16/2021 0914   HDL 29 (L) 11/16/2021 0914   CHOLHDL 4.3 11/16/2021 0914   CHOLHDL 4.9 04/14/2015 0943   VLDL 22 04/14/2015 0943   LDLCALC 74 11/16/2021 0914   LDLDIRECT 198 (H) 05/29/2012 1744    CBC    Component Value Date/Time   WBC 8.8 03/18/2020 1019   WBC 8.0 08/16/2014 1147   RBC 4.60 03/18/2020 1019   RBC 4.97 08/16/2014 1147   HGB 13.3 10/17/2021 0650   HGB 12.6 (L) 03/18/2020 1019   HCT 39.0 10/17/2021 0650   HCT 38.5 03/18/2020 1019  PLT 289 03/18/2020 1019   MCV 84 03/18/2020 1019   MCH 27.4 03/18/2020 1019   MCH 27.6 08/16/2014 1147   MCHC 32.7 03/18/2020 1019   MCHC 33.5 08/16/2014 1147   RDW 14.5 03/18/2020 1019   LYMPHSABS 3.5 12/31/2013 0315   MONOABS 1.1 (H) 12/31/2013 0315   EOSABS 0.3 12/31/2013 0315   BASOSABS 0.0 12/31/2013 0315    ASSESSMENT AND PLAN:  1. Coronary artery disease of bypass graft of native  heart with stable angina pectoris (HCC) Stable.  Continue clopidogrel, aspirin, metoprolol, atorvastatin - clopidogrel (PLAVIX) 75 MG tablet; Take 1 tablet (75 mg total) by mouth daily.  Dispense: 90 tablet; Refill: 2 - metoprolol tartrate (LOPRESSOR) 25 MG tablet; Take 1 tablet (25 mg total) by mouth 2 (two) times daily.  Dispense: 180 tablet; Refill: 2 - CBC - Comprehensive metabolic panel - Lipid panel  2. Chronic combined systolic and diastolic CHF (congestive heart failure) (HCC) Compensated.  Continue metoprolol and furosemide. - furosemide (LASIX) 40 MG tablet; Take 1 tablet (40 mg total) by mouth daily.  Dispense: 90 tablet; Refill: 2  3. Essential hypertension At goal.  Continue metoprolol 25 mg twice a day, - cinacalcet (SENSIPAR) 30 MG tablet; 1 tab PO three times a week on dialysis days  Dispense: 36 tablet; Refill: 2 - metoprolol tartrate (LOPRESSOR) 25 MG tablet; Take 1 tablet (25 mg total) by mouth 2 (two) times daily.  Dispense: 180 tablet; Refill: 2  4. ESRD on hemodialysis [facility]) On transplant list at [facility]. Explained to him that Sensipar helps to promote bone health in patients with end-stage renal disease who have hyperparathyroidism associated with the disease.  5. Influenza vaccination declined Recommended.  Patient declined.  6. COVID-19 vaccination declined Recommended.  Patient declined.  7. Need for shingles vaccine Patient received first Shingrix vaccine today.  Advised that it will cause some soreness at the injection site for several days. - Zoster Vaccine Adjuvanted [facility]) injection; Inject 0.5 mLs into the muscle once for 1 dose.  Dispense: 0.5 mL; Refill: 0    Patient was given the opportunity to ask questions.  Patient verbalized understanding of the plan and was able to repeat key elements of the plan.   This documentation was completed using Paediatric nurse.  Any transcriptional errors are  unintentional.  Orders Placed This Encounter  Procedures   CBC   Comprehensive metabolic panel   Lipid panel     Requested Prescriptions   Signed Prescriptions Disp Refills   cinacalcet (SENSIPAR) 30 MG tablet 36 tablet 2    Sig: 1 tab PO three times a week on dialysis days   clopidogrel (PLAVIX) 75 MG tablet 90 tablet 2    Sig: Take 1 tablet (75 mg total) by mouth daily.   furosemide (LASIX) 40 MG tablet 90 tablet 2    Sig: Take 1 tablet (40 mg total) by mouth daily.   metoprolol tartrate (LOPRESSOR) 25 MG tablet 180 tablet 2    Sig: Take 1 tablet (25 mg total) by mouth 2 (two) times daily.   Zoster Vaccine Adjuvanted [facility]) injection 0.5 mL 0    Sig: Inject 0.5 mLs into the muscle once for 1 dose.    Return in about 4 months (around 03/01/2023).  Jonah Blue, MD, FACP

## 2022-10-30 NOTE — Patient Instructions (Signed)
Cinacalcet Tablets What is this medication? CINACALCET (sin a CAL set) treats an overactive parathyroid gland (hyperparathyroidism) in people with kidney disease. It works by lowering parathyroid hormone levels in your body, which lowers calcium and phosphorus levels. This supports bone health. It may also be used to treat high calcium levels in people with parathyroid gland conditions. This medicine may be used for other purposes; ask your health care provider or pharmacist if you have questions. COMMON BRAND NAME(S): Sensipar What should I tell my care team before I take this medication? They need to know if you have any of these conditions: Heart disease History of irregular heartbeat Liver disease Low levels of calcium in the blood Seizures Stomach or intestine problems An unusual or allergic reaction to cinacalcet, other medications, foods, dyes, or preservatives Pregnant or trying to get pregnant Breast-feeding How should I use this medication? Take this medication by mouth with water. Take it as directed on the prescription label at the same time every day. Do not break, chew, or crush tablets. Take this medication with food or right after a meal. Keep taking it unless your care team tells you to stop. Talk to your care team about the use of this medication in children. Special care may be needed. Overdosage: If you think you have taken too much of this medicine contact a poison control center or emergency room at once. NOTE: This medicine is only for you. Do not share this medicine with others. What if I miss a dose? If you miss a dose, take it as soon as you can. If it is almost time for your next dose, take only that dose. Do not take double or extra doses. What may interact with this medication? Do not take this medication with any of the following: Thioridazine This medication may also interact with the following: Erythromycin Flecainide Grapefruit juice Medications for  depression, anxiety, or mental health conditions Medications for fungal infections, such as fluconazole, itraconazole, ketoconazole, voriconazole Medications for pain Medications for sleep St. John's Wort Vinblastine This list may not describe all possible interactions. Give your health care provider a list of all the medicines, herbs, non-prescription drugs, or dietary supplements you use. Also tell them if you smoke, drink alcohol, or use illegal drugs. Some items may interact with your medicine. What should I watch for while using this medication? Visit your care team for regular checks on your progress. Your care team will order important blood tests while you are taking this medication. Check your blood pressure as directed. Know what your blood pressure should be and when to contact your care team. This medication alters the calcium level in your blood. You may need to be on a special diet. Talk to your care team about the foods you eat and the vitamins you take. What side effects may I notice from receiving this medication? Side effects that you should report to your care team as soon as possible: Allergic reactions--skin rash, itching, hives, swelling of the face, lips, tongue, or throat Bone pain Heart rhythm changes--fast or irregular heartbeat, dizziness, feeling faint or lightheaded, chest pain, trouble breathing Low blood pressure--dizziness, feeling faint or lightheaded, blurry vision Low calcium level--muscle pain or cramps, confusion, tingling, or numbness in the hands or feet Seizures Stomach bleeding--bloody or black, tar-like stools, vomiting blood or brown material that looks like coffee grounds Side effects that usually do not require medical attention (report to your care team if they continue or are bothersome): Diarrhea Dizziness Muscle pain  or cramps Nausea Vomiting This list may not describe all possible side effects. Call your doctor for medical advice about side  effects. You may report side effects to FDA at 1-800-FDA-1088. Where should I keep my medication? Keep out of the reach of children and pets. Store between 15 and 30 degrees C (59 and 86 degrees F). Get rid of any unused medication after the expiration date. To get rid of medications that are no longer needed or have expired: Take the medication to a medication take-back program. Check with your pharmacy or law enforcement to find a location. If you cannot return the medication, check the label or package insert to see if the medication should be thrown out in the garbage or flushed down the toilet. If you are not sure, ask your care team. If it is safe to put it in the trash, empty the medication out of the container. Mix the medication with cat litter, dirt, coffee grounds, or other unwanted substance. Seal the mixture in a bag or container. Put it in the trash. NOTE: This sheet is a summary. It may not cover all possible information. If you have questions about this medicine, talk to your doctor, pharmacist, or health care provider.  2024 Elsevier/Gold Standard (2021-05-11 00:00:00)

## 2022-10-31 DIAGNOSIS — N2581 Secondary hyperparathyroidism of renal origin: Secondary | ICD-10-CM | POA: Diagnosis not present

## 2022-10-31 DIAGNOSIS — N186 End stage renal disease: Secondary | ICD-10-CM | POA: Diagnosis not present

## 2022-10-31 DIAGNOSIS — Z992 Dependence on renal dialysis: Secondary | ICD-10-CM | POA: Diagnosis not present

## 2022-11-02 DIAGNOSIS — Z992 Dependence on renal dialysis: Secondary | ICD-10-CM | POA: Diagnosis not present

## 2022-11-02 DIAGNOSIS — N186 End stage renal disease: Secondary | ICD-10-CM | POA: Diagnosis not present

## 2022-11-02 DIAGNOSIS — N2581 Secondary hyperparathyroidism of renal origin: Secondary | ICD-10-CM | POA: Diagnosis not present

## 2022-11-05 DIAGNOSIS — N186 End stage renal disease: Secondary | ICD-10-CM | POA: Diagnosis not present

## 2022-11-05 DIAGNOSIS — N2581 Secondary hyperparathyroidism of renal origin: Secondary | ICD-10-CM | POA: Diagnosis not present

## 2022-11-05 DIAGNOSIS — Z992 Dependence on renal dialysis: Secondary | ICD-10-CM | POA: Diagnosis not present

## 2022-11-07 DIAGNOSIS — N2581 Secondary hyperparathyroidism of renal origin: Secondary | ICD-10-CM | POA: Diagnosis not present

## 2022-11-07 DIAGNOSIS — N186 End stage renal disease: Secondary | ICD-10-CM | POA: Diagnosis not present

## 2022-11-07 DIAGNOSIS — Z992 Dependence on renal dialysis: Secondary | ICD-10-CM | POA: Diagnosis not present

## 2022-11-09 DIAGNOSIS — N186 End stage renal disease: Secondary | ICD-10-CM | POA: Diagnosis not present

## 2022-11-09 DIAGNOSIS — N2581 Secondary hyperparathyroidism of renal origin: Secondary | ICD-10-CM | POA: Diagnosis not present

## 2022-11-09 DIAGNOSIS — Z992 Dependence on renal dialysis: Secondary | ICD-10-CM | POA: Diagnosis not present

## 2022-11-12 DIAGNOSIS — Z992 Dependence on renal dialysis: Secondary | ICD-10-CM | POA: Diagnosis not present

## 2022-11-12 DIAGNOSIS — N186 End stage renal disease: Secondary | ICD-10-CM | POA: Diagnosis not present

## 2022-11-12 DIAGNOSIS — N2581 Secondary hyperparathyroidism of renal origin: Secondary | ICD-10-CM | POA: Diagnosis not present

## 2022-11-12 NOTE — Progress Notes (Unsigned)
Office Visit    Patient Name: Seth Keller Date of Encounter: 11/13/2022  PCP:  Marcine Matar, MD   [facility]  Cardiologist:  Verne Carrow, MD  Advanced Practice Provider:  No care team member to display Electrophysiologist:  None   HPI    Seth Keller is a 57 y.o. male with past medical history significant for CAD with previous CABG x4 on November 2015, hypertension, hyperlipidemia, CKD with ESRD and chronic hemodialysis presents today for routine follow-up appointment.  He was recently seen by [facility] cardiology about a month ago.  He is followed by them because he is a renal transplant candidate for end-stage renal disease.  His last echocardiogram was normal with an EF of 60 to 65% and no valvular abnormalities.  He had not had any angina, orthopnea, PND, lower extremity edema, palpitations or syncope.  Refills of all his cardiac medications were provided during that appointment.  His Imdur was discontinued.  He did have some mild hypotension.  He was seen by me 11/16/21, he tells me he does get SOB at times when walking but this has been stable. He used to play handball and his knees have been bothering him lately. No issues with lower extremity edema. He is still waiting on renal transplant and does not have a timeline. He does not take any of his medications before dialysis since his BP does drop low. He does take them afterwards. We discussed decreasing his Amlodipine which he has already been doing and cutting the pills in half has been a challenge.   Today, he tells me that his knees are bothering him. No swelling in his legs. The usually chest pain that he has and once in while he has to pop a nitroglycerin once or twice a month. This is nothing new for him. Amlodipine was resumed due to hypertension by his PCP but now BP is very low. Being a dialysis patient, I do not want his BP this low. No news on the kidney transplant front. Otherwise,  he is trying to stay as active as he can. He does nap often on dialysis days.  Reports no shortness of breath nor dyspnea on exertion. No edema, orthopnea, PND. Reports no palpitations.    Past Medical History    Past Medical History:  Diagnosis Date   Atrial fibrillation with RVR (HCC)    a. 12/2013 post-op CABG ->on amio.   CAD (coronary artery disease)    a. 12/2013 NSTEMI/Cath: 3VD;  b. 12/2013 CABG x 4: LIMA->LAD, VG->RI, VG->OM1->LPDA.   Chronic combined systolic and diastolic CHF (congestive heart failure) (HCC)    a. 12/2013 Echo: EF 45%, Gr 2 DD.   CKD (chronic kidney disease), stage III (HCC)    Dyspnea    Hypertension    a. Dx ~ 2000.   Ischemic cardiomyopathy    a. 12/2013 Echo: EF 45%, Gr 2 DD, basal-mid inflat and inf AK, mild MR.   Myocardial infarction [facility])    Tobacco abuse    Past Surgical History:  Procedure Laterality Date   AV FISTULA PLACEMENT Right 08/15/2020   Procedure: RIGHT ARM RADIOCEPHALIC ARTERIOVENOUS (AV) FISTULA CREATION;  Surgeon: Leonie Douglas, MD;  Location: [facility] OR;  Service: Vascular;  Laterality: Right;   BIOPSY  10/17/2021   Procedure: BIOPSY;  Surgeon: Kathi Der, MD;  Location: [facility] ENDOSCOPY;  Service: Gastroenterology;;   CARDIAC CATHETERIZATION     COLONOSCOPY WITH PROPOFOL N/A 10/17/2021   Procedure:  COLONOSCOPY WITH PROPOFOL;  Surgeon: Kathi Der, MD;  Location: [facility] ENDOSCOPY;  Service: Gastroenterology;  Laterality: N/A;   CORONARY ARTERY BYPASS GRAFT N/A 01/01/2014   Procedure: CORONARY ARTERY BYPASS GRAFTING (CABG) times four using left internal mammary artery and right saphenous vein;  Surgeon: Alleen Borne, MD;  Location: [facility] OR;  Service: Open Heart Surgery;  Laterality: N/A;   INTRAOPERATIVE TRANSESOPHAGEAL ECHOCARDIOGRAM N/A 01/01/2014   Procedure: INTRAOPERATIVE TRANSESOPHAGEAL ECHOCARDIOGRAM;  Surgeon: Alleen Borne, MD;  Location: [facility] OR;  Service: Open Heart Surgery;  Laterality: N/A;   LEFT HEART  CATHETERIZATION WITH CORONARY ANGIOGRAM N/A 12/29/2013   Procedure: LEFT HEART CATHETERIZATION WITH CORONARY ANGIOGRAM;  Surgeon: Lesleigh Noe, MD;  Location: [facility] CATH LAB;  Service: Cardiovascular;  Laterality: N/A;   none     POLYPECTOMY  10/17/2021   Procedure: POLYPECTOMY;  Surgeon: Kathi Der, MD;  Location: [facility] ENDOSCOPY;  Service: Gastroenterology;;    Allergies  Allergies  Allergen Reactions   Iodinated Contrast Media Anaphylaxis   Shellfish Allergy Anaphylaxis    Throat close up, swelling   EKGs/Labs/Other Studies Reviewed:   The following studies were reviewed today:  Echocardiogram 09/03/2016  Study Conclusions   - Left ventricle: The cavity size was normal. Wall thickness was    increased in a pattern of mild LVH. Systolic function was normal.    The estimated ejection fraction was in the range of 50% to 55%.    Wall motion was normal; there were no regional wall motion    abnormalities. Features are consistent with a pseudonormal left    ventricular filling pattern, with concomitant abnormal relaxation    and increased filling pressure (grade 2 diastolic dysfunction).   -------------------------------------------------------------------  Study data:  The previous study was not available, so comparison  was made to the report of 04/29/2014.  Study status:  Routine.  Procedure:  Transthoracic echocardiography. Image quality was  adequate.  Study completion:  There were no complications.  Transthoracic echocardiography.  M-mode, complete 2D, spectral  Doppler, and color Doppler.  Birthdate:  Patient birthdate:  1965-07-05.  Age:  Patient is 57 yr old.  Sex:  Gender: male.  BMI: 35.8 kg/m^2.  Blood pressure:     146/81  Patient status:  Outpatient.  Study date:  Study date: 09/03/2016. Study time: 02:55  PM.  Location:  Echo laboratory.   -------------------------------------------------------------------    -------------------------------------------------------------------  Left ventricle:  Global longitudinal strain is -17.7%   The cavity size was normal. Wall thickness was increased in a  pattern of mild LVH. Systolic function was normal. The estimated  ejection fraction was in the range of 50% to 55%. Wall motion was  normal; there were no regional wall motion abnormalities. Features  are consistent with a pseudonormal left ventricular filling  pattern, with concomitant abnormal relaxation and increased filling  pressure (grade 2 diastolic dysfunction).   -------------------------------------------------------------------  Aortic valve:   Structurally normal valve.   Cusp separation was  normal.  Doppler:  Transvalvular velocity was within the normal  range. There was no stenosis. There was no regurgitation.   -------------------------------------------------------------------  Aorta:  Aortic root: The aortic root was normal in size.  Ascending aorta: The ascending aorta was normal in size.   -------------------------------------------------------------------  Mitral valve:   Structurally normal valve.   Leaflet separation was  normal.  Doppler:  Transvalvular velocity was within the normal  range. There was no evidence for stenosis. There was no  regurgitation.    Peak gradient (D):  3 mm Hg.   -------------------------------------------------------------------  Left atrium:  The atrium was normal in size.   -------------------------------------------------------------------  Right ventricle:  The cavity size was normal. Systolic function was  normal.   -------------------------------------------------------------------  Pulmonic valve:    The valve appears to be grossly normal.  Doppler:  There was no significant regurgitation.   -------------------------------------------------------------------  Tricuspid valve:   Structurally normal valve.   Leaflet separation  was  normal.  Doppler:  Transvalvular velocity was within the normal  range. There was no regurgitation.   -------------------------------------------------------------------  Right atrium:  The atrium was normal in size.   -------------------------------------------------------------------  Pericardium:  There was no pericardial effusion.   EKG:  EKG is not ordered today.  We are working on getting EKG from [facility]  Recent Labs: No results found for requested labs within last 365 days.  Recent Lipid Panel    Component Value Date/Time   CHOL 125 11/16/2021 0914   TRIG 122 11/16/2021 0914   HDL 29 (L) 11/16/2021 0914   CHOLHDL 4.3 11/16/2021 0914   CHOLHDL 4.9 04/14/2015 0943   VLDL 22 04/14/2015 0943   LDLCALC 74 11/16/2021 0914   LDLDIRECT 198 (H) 05/29/2012 1744    Home Medications   Current Meds  Medication Sig   aspirin EC 81 MG tablet Take 1 tablet (81 mg total) by mouth daily.   atorvastatin (LIPITOR) 80 MG tablet Take 80 mg by mouth daily.   Blood Pressure Monitor DEVI Use as directed to check home blood pressure 2-3 times a week   cholecalciferol (VITAMIN D3) 25 MCG (1000 UNIT) tablet Take 1,000 Units by mouth daily.   cinacalcet (SENSIPAR) 30 MG tablet 1 tab PO three times a week on dialysis days   clopidogrel (PLAVIX) 75 MG tablet Take 1 tablet (75 mg total) by mouth daily.   furosemide (LASIX) 40 MG tablet Take 1 tablet (40 mg total) by mouth daily.   metoprolol tartrate (LOPRESSOR) 25 MG tablet Take 1 tablet (25 mg total) by mouth 2 (two) times daily.   nitroGLYCERIN (NITROSTAT) 0.4 MG SL tablet Place 1 tablet (0.4 mg total) under the tongue every 5 (five) minutes as needed for chest pain.   sucroferric oxyhydroxide (VELPHORO) 500 MG chewable tablet Chew 500-1,500 mg by mouth See admin instructions. Take 1500 mg with each meal and 500 mg with each snack   [DISCONTINUED] amLODipine (NORVASC) 10 MG tablet Take 10 mg by mouth daily.     Review of Systems      All  other systems reviewed and are otherwise negative except as noted above.  Physical Exam    VS:  BP (!) 86/58   Pulse 72   Ht 5\' 10"  (1.778 m)   Wt 228 lb (103.4 kg)   SpO2 95%   BMI 32.71 kg/m  , BMI Body mass index is 32.71 kg/m.  Wt Readings from Last 3 Encounters:  11/13/22 228 lb (103.4 kg)  10/30/22 230 lb (104.3 kg)  06/26/22 229 lb (103.9 kg)     GEN: Well nourished, well developed, in no acute distress. HEENT: normal. Neck: Supple, no JVD, carotid bruits, or masses. Cardiac: RRR, no murmurs, rubs, or gallops. No clubbing, cyanosis, edema.  Radials/PT 2+ and equal bilaterally.  Respiratory:  Respirations regular and unlabored, clear to auscultation bilaterally. GI: Soft, nontender, nondistended. MS: No deformity or atrophy. Skin: Warm and dry, no rash. Right lower arm fistula with thrill Neuro:  Strength and sensation are intact. Psych: Normal affect.  Assessment & Plan    Coronary artery disease without angina/CABG x4 in 2015 He is taking Lipitor 80mg  daily -PCP has ordered a lipid panel -continue Norvasc 5 mg daily, ASA 81mg , Lasix 40mg  daily (non dialysis days), Lopressor 25mg  BID, and nitro as needed  Essential hypertension -Low today 86/58 -stop Amlodipine and keep track of BP for 2 weeks  Dyslipidemia -he is back on Lipitor -PCP has ordered a lipid panel to be drawn at dialysis  ESRD on HD, renal transplant candidate -stable on dialysis -he has had some kidney offers but none have worked out      Disposition: Follow up 1 year with Verne Carrow, MD or APP.  Signed, Sharlene Dory, PA-C 11/13/2022, 8:46 AM [facility]

## 2022-11-13 ENCOUNTER — Encounter: Payer: Self-pay | Admitting: Physician Assistant

## 2022-11-13 ENCOUNTER — Ambulatory Visit: Payer: Medicare HMO | Attending: Physician Assistant | Admitting: Physician Assistant

## 2022-11-13 VITALS — BP 86/58 | HR 72 | Ht 70.0 in | Wt 228.0 lb

## 2022-11-13 DIAGNOSIS — E785 Hyperlipidemia, unspecified: Secondary | ICD-10-CM

## 2022-11-13 DIAGNOSIS — N183 Chronic kidney disease, stage 3 unspecified: Secondary | ICD-10-CM | POA: Diagnosis not present

## 2022-11-13 DIAGNOSIS — I2510 Atherosclerotic heart disease of native coronary artery without angina pectoris: Secondary | ICD-10-CM

## 2022-11-13 DIAGNOSIS — I10 Essential (primary) hypertension: Secondary | ICD-10-CM | POA: Diagnosis not present

## 2022-11-13 DIAGNOSIS — Z951 Presence of aortocoronary bypass graft: Secondary | ICD-10-CM

## 2022-11-13 DIAGNOSIS — N184 Chronic kidney disease, stage 4 (severe): Secondary | ICD-10-CM

## 2022-11-13 NOTE — Patient Instructions (Signed)
Medication Instructions:  Stop amlodipine *If you need a refill on your cardiac medications before your next appointment, please call your pharmacy*  Lab Work: Have labs drawn when you have dialysis If you have labs (blood work) drawn today and your tests are completely normal, you will receive your results only by: MyChart Message (if you have MyChart) OR A paper copy in the mail If you have any lab test that is abnormal or we need to change your treatment, we will call you to review the results.  Follow-Up: At [facility], you and your health needs are our priority.  As part of our continuing mission to provide you with exceptional heart care, we have created designated Provider Care Teams.  These Care Teams include your primary Cardiologist (physician) and Advanced Practice Providers (APPs -  Physician Assistants and Nurse Practitioners) who all work together to provide you with the care you need, when you need it.  Your next appointment:   1 year(s)  Provider:   Verne Carrow, MD  or Jari Favre, PA-C        Other Instructions Check your blood pressure daily, 1 hr after morning medications for 2 weeks, keep a log and send [place] the readings through mychart at the end of the 2 weeks.   Heart-Healthy Eating Plan Many factors influence your heart health, including eating and exercise habits. Heart health is also called coronary health. Coronary risk increases with abnormal blood fat (lipid) levels. A heart-healthy eating plan includes limiting unhealthy fats, increasing healthy fats, limiting salt (sodium) intake, and making other diet and lifestyle changes. What is my plan? Your health care provider may recommend that: You limit your fat intake to _________% or less of your total calories each day. You limit your saturated fat intake to _________% or less of your total calories each day. You limit the amount of cholesterol in your diet to less than _________ mg per  day. You limit the amount of sodium in your diet to less than _________ mg per day. What are tips for following this plan? Cooking Cook foods using methods other than frying. Baking, boiling, grilling, and broiling are all good options. Other ways to reduce fat include: Removing the skin from poultry. Removing all visible fats from meats. Steaming vegetables in water or broth. Meal planning  At meals, imagine dividing your plate into fourths: Fill one-half of your plate with vegetables and green salads. Fill one-fourth of your plate with whole grains. Fill one-fourth of your plate with lean protein foods. Eat 2-4 cups of vegetables per day. One cup of vegetables equals 1 cup (91 g) broccoli or cauliflower florets, 2 medium carrots, 1 large bell pepper, 1 large sweet potato, 1 large tomato, 1 medium white potato, 2 cups (150 g) raw leafy greens. Eat 1-2 cups of fruit per day. One cup of fruit equals 1 small apple, 1 large banana, 1 cup (237 g) mixed fruit, 1 large orange,  cup (82 g) dried fruit, 1 cup (240 mL) 100% fruit juice. Eat more foods that contain soluble fiber. Examples include apples, broccoli, carrots, beans, peas, and barley. Aim to get 25-30 g of fiber per day. Increase your consumption of legumes, nuts, and seeds to 4-5 servings per week. One serving of dried beans or legumes equals  cup (90 g) cooked, 1 serving of nuts is  oz (12 almonds, 24 pistachios, or 7 walnut halves), and 1 serving of seeds equals  oz (8 g). Fats Choose healthy fats  more often. Choose monounsaturated and polyunsaturated fats, such as olive and canola oils, avocado oil, flaxseeds, walnuts, almonds, and seeds. Eat more omega-3 fats. Choose salmon, mackerel, sardines, tuna, flaxseed oil, and ground flaxseeds. Aim to eat fish at least 2 times each week. Check food labels carefully to identify foods with trans fats or high amounts of saturated fat. Limit saturated fats. These are found in animal products,  such as meats, butter, and cream. Plant sources of saturated fats include palm oil, palm kernel oil, and coconut oil. Avoid foods with partially hydrogenated oils in them. These contain trans fats. Examples are stick margarine, some tub margarines, cookies, crackers, and other baked goods. Avoid fried foods. General information Eat more home-cooked food and less restaurant, buffet, and fast food. Limit or avoid alcohol. Limit foods that are high in added sugar and simple starches such as foods made using white refined flour (white breads, pastries, sweets). Lose weight if you are overweight. Losing just 5-10% of your body weight can help your overall health and prevent diseases such as diabetes and heart disease. Monitor your sodium intake, especially if you have high blood pressure. Talk with your health care provider about your sodium intake. Try to incorporate more vegetarian meals weekly. What foods should I eat? Fruits All fresh, canned (in natural juice), or frozen fruits. Vegetables Fresh or frozen vegetables (raw, steamed, roasted, or grilled). Green salads. Grains Most grains. Choose whole wheat and whole grains most of the time. Rice and pasta, including brown rice and pastas made with whole wheat. Meats and other proteins Lean, well-trimmed beef, veal, pork, and lamb. Chicken and [place] without skin. All fish and shellfish. Wild duck, rabbit, pheasant, and venison. Egg whites or low-cholesterol egg substitutes. Dried beans, peas, lentils, and tofu. Seeds and most nuts. Dairy Low-fat or nonfat cheeses, including ricotta and mozzarella. Skim or 1% milk (liquid, powdered, or evaporated). Buttermilk made with low-fat milk. Nonfat or low-fat yogurt. Fats and oils Non-hydrogenated (trans-free) margarines. Vegetable oils, including soybean, sesame, sunflower, olive, avocado, peanut, safflower, corn, canola, and cottonseed. Salad dressings or mayonnaise made with a vegetable  oil. Beverages Water (mineral or sparkling). Coffee and tea. Unsweetened ice tea. Diet beverages. Sweets and desserts Sherbet, gelatin, and fruit ice. Small amounts of dark chocolate. Limit all sweets and desserts. Seasonings and condiments All seasonings and condiments. The items listed above may not be a complete list of foods and beverages you can eat. Contact a dietitian for more options. What foods should I avoid? Fruits Canned fruit in heavy syrup. Fruit in cream or butter sauce. Fried fruit. Limit coconut. Vegetables Vegetables cooked in cheese, cream, or butter sauce. Fried vegetables. Grains Breads made with saturated or trans fats, oils, or whole milk. Croissants. Sweet rolls. Donuts. High-fat crackers, such as cheese crackers and chips. Meats and other proteins Fatty meats, such as hot dogs, ribs, sausage, bacon, rib-eye roast or steak. High-fat deli meats, such as salami and bologna. Caviar. Domestic duck and goose. Organ meats, such as liver. Dairy Cream, sour cream, cream cheese, and creamed cottage cheese. Whole-milk cheeses. Whole or 2% milk (liquid, evaporated, or condensed). Whole buttermilk. Cream sauce or high-fat cheese sauce. Whole-milk yogurt. Fats and oils Meat fat, or shortening. Cocoa butter, hydrogenated oils, palm oil, coconut oil, palm kernel oil. Solid fats and shortenings, including bacon fat, salt pork, lard, and butter. Nondairy cream substitutes. Salad dressings with cheese or sour cream. Beverages Regular sodas and any drinks with added sugar. Sweets and desserts Frosting. Pudding. Cookies.  Cakes. Pies. Milk chocolate or white chocolate. Buttered syrups. Full-fat ice cream or ice cream drinks. The items listed above may not be a complete list of foods and beverages to avoid. Contact a dietitian for more information. Summary Heart-healthy meal planning includes limiting unhealthy fats, increasing healthy fats, limiting salt (sodium) intake and making  other diet and lifestyle changes. Lose weight if you are overweight. Losing just 5-10% of your body weight can help your overall health and prevent diseases such as diabetes and heart disease. Focus on eating a balance of foods, including fruits and vegetables, low-fat or nonfat dairy, lean protein, nuts and legumes, whole grains, and heart-healthy oils and fats. This information is not intended to replace advice given to you by your health care provider. Make sure you discuss any questions you have with your health care provider. Document Revised: 03/13/2021 Document Reviewed: 03/13/2021 Elsevier Patient Education  2024 [employer] Low-Sodium Eating Plan Salt (sodium) helps you keep a healthy balance of fluids in your body. Too much sodium can raise your blood pressure. It can also cause fluid and waste to be held in your body. Your health care provider or dietitian may recommend a low-sodium eating plan if you have high blood pressure (hypertension), kidney disease, liver disease, or heart failure. Eating less sodium can help lower your blood pressure and reduce swelling. It can also protect your heart, liver, and kidneys. What are tips for following this plan? Reading food labels  Check food labels for the amount of sodium per serving. If you eat more than one serving, you must multiply the listed amount by the number of servings. Choose foods with less than 140 milligrams (mg) of sodium per serving. Avoid foods with 300 mg of sodium or more per serving. Always check how much sodium is in a product, even if the label says "unsalted" or "no salt added." Shopping  Buy products labeled as "low-sodium" or "no salt added." Buy fresh foods. Avoid canned foods and pre-made or frozen meals. Avoid canned, cured, or processed meats. Buy breads that have less than 80 mg of sodium per slice. Cooking  Eat more home-cooked food. Try to eat less restaurant, buffet, and fast food. Try not to add salt  when you cook. Use salt-free seasonings or herbs instead of table salt or sea salt. Check with your provider or pharmacist before using salt substitutes. Cook with plant-based oils, such as canola, sunflower, or olive oil. Meal planning When eating at a restaurant, ask if your food can be made with less salt or no salt. Avoid dishes labeled as brined, pickled, cured, or smoked. Avoid dishes made with soy sauce, miso, or teriyaki sauce. Avoid foods that have monosodium glutamate (MSG) in them. MSG may be added to some restaurant food, sauces, soups, bouillon, and canned foods. Make meals that can be grilled, baked, poached, roasted, or steamed. These are often made with less sodium. General information Try to limit your sodium intake to 1,500-2,300 mg each day, or the amount told by your provider. What foods should I eat? Fruits Fresh, frozen, or canned fruit. Fruit juice. Vegetables Fresh or frozen vegetables. "No salt added" canned vegetables. "No salt added" tomato sauce and paste. Low-sodium or reduced-sodium tomato and vegetable juice. Grains Low-sodium cereals, such as oats, puffed wheat and rice, and shredded wheat. Low-sodium crackers. Unsalted rice. Unsalted pasta. Low-sodium bread. Whole grain breads and whole grain pasta. Meats and other proteins Fresh or frozen meat, poultry, seafood, and fish. These should have no  added salt. Low-sodium canned tuna and salmon. Unsalted nuts. Dried peas, beans, and lentils without added salt. Unsalted canned beans. Eggs. Unsalted nut butters. Dairy Milk. Soy milk. Cheese that is naturally low in sodium, such as ricotta cheese, fresh mozzarella, or Swiss cheese. Low-sodium or reduced-sodium cheese. Cream cheese. Yogurt. Seasonings and condiments Fresh and dried herbs and spices. Salt-free seasonings. Low-sodium mustard and ketchup. Sodium-free salad dressing. Sodium-free light mayonnaise. Fresh or refrigerated horseradish. Lemon juice. Vinegar. Other  foods Homemade, reduced-sodium, or low-sodium soups. Unsalted popcorn and pretzels. Low-salt or salt-free chips. The items listed above may not be all the foods and drinks you can have. Talk to a dietitian to learn more. What foods should I avoid? Vegetables Sauerkraut, pickled vegetables, and relishes. Olives. [place] fries. Onion rings. Regular canned vegetables, except low-sodium or reduced-sodium items. Regular canned tomato sauce and paste. Regular tomato and vegetable juice. Frozen vegetables in sauces. Grains Instant hot cereals. Bread stuffing, pancake, and biscuit mixes. Croutons. Seasoned rice or pasta mixes. Noodle soup cups. Boxed or frozen macaroni and cheese. Regular salted crackers. Self-rising flour. Meats and other proteins Meat or fish that is salted, canned, smoked, spiced, or pickled. Precooked or cured meat, such as sausages or meat loaves. Tomasa Blase. Ham. Pepperoni. Hot dogs. Corned beef. Chipped beef. Salt pork. Jerky. Pickled herring, anchovies, and sardines. Regular canned tuna. Salted nuts. Dairy Processed cheese and cheese spreads. Hard cheeses. Cheese curds. Blue cheese. Feta cheese. String cheese. Regular cottage cheese. Buttermilk. Canned milk. Fats and oils Salted butter. Regular margarine. Ghee. Bacon fat. Seasonings and condiments Onion salt, garlic salt, seasoned salt, table salt, and sea salt. Canned and packaged gravies. Worcestershire sauce. Tartar sauce. Barbecue sauce. Teriyaki sauce. Soy sauce, including reduced-sodium soy sauce. Steak sauce. Fish sauce. Oyster sauce. Cocktail sauce. Horseradish that you find on the shelf. Regular ketchup and mustard. Meat flavorings and tenderizers. Bouillon cubes. Hot sauce. Pre-made or packaged marinades. Pre-made or packaged taco seasonings. Relishes. Regular salad dressings. Salsa. Other foods Salted popcorn and pretzels. Corn chips and puffs. Potato and tortilla chips. Canned or dried soups. Pizza. Frozen entrees and pot  pies. The items listed above may not be all the foods and drinks you should avoid. Talk to a dietitian to learn more. This information is not intended to replace advice given to you by your health care provider. Make sure you discuss any questions you have with your health care provider. Document Revised: 02/22/2022 Document Reviewed: 02/22/2022 Elsevier Patient Education  2024 [employer].

## 2022-11-14 DIAGNOSIS — N2581 Secondary hyperparathyroidism of renal origin: Secondary | ICD-10-CM | POA: Diagnosis not present

## 2022-11-14 DIAGNOSIS — Z992 Dependence on renal dialysis: Secondary | ICD-10-CM | POA: Diagnosis not present

## 2022-11-14 DIAGNOSIS — N186 End stage renal disease: Secondary | ICD-10-CM | POA: Diagnosis not present

## 2022-11-16 DIAGNOSIS — N186 End stage renal disease: Secondary | ICD-10-CM | POA: Diagnosis not present

## 2022-11-16 DIAGNOSIS — Z992 Dependence on renal dialysis: Secondary | ICD-10-CM | POA: Diagnosis not present

## 2022-11-16 DIAGNOSIS — N2581 Secondary hyperparathyroidism of renal origin: Secondary | ICD-10-CM | POA: Diagnosis not present

## 2022-11-19 DIAGNOSIS — I129 Hypertensive chronic kidney disease with stage 1 through stage 4 chronic kidney disease, or unspecified chronic kidney disease: Secondary | ICD-10-CM | POA: Diagnosis not present

## 2022-11-19 DIAGNOSIS — N2581 Secondary hyperparathyroidism of renal origin: Secondary | ICD-10-CM | POA: Diagnosis not present

## 2022-11-19 DIAGNOSIS — N186 End stage renal disease: Secondary | ICD-10-CM | POA: Diagnosis not present

## 2022-11-19 DIAGNOSIS — Z992 Dependence on renal dialysis: Secondary | ICD-10-CM | POA: Diagnosis not present

## 2022-11-21 DIAGNOSIS — N186 End stage renal disease: Secondary | ICD-10-CM | POA: Diagnosis not present

## 2022-11-21 DIAGNOSIS — N2581 Secondary hyperparathyroidism of renal origin: Secondary | ICD-10-CM | POA: Diagnosis not present

## 2022-11-21 DIAGNOSIS — Z992 Dependence on renal dialysis: Secondary | ICD-10-CM | POA: Diagnosis not present

## 2022-11-23 DIAGNOSIS — N186 End stage renal disease: Secondary | ICD-10-CM | POA: Diagnosis not present

## 2022-11-23 DIAGNOSIS — Z992 Dependence on renal dialysis: Secondary | ICD-10-CM | POA: Diagnosis not present

## 2022-11-23 DIAGNOSIS — N2581 Secondary hyperparathyroidism of renal origin: Secondary | ICD-10-CM | POA: Diagnosis not present

## 2022-11-26 DIAGNOSIS — N186 End stage renal disease: Secondary | ICD-10-CM | POA: Diagnosis not present

## 2022-11-26 DIAGNOSIS — N2581 Secondary hyperparathyroidism of renal origin: Secondary | ICD-10-CM | POA: Diagnosis not present

## 2022-11-26 DIAGNOSIS — Z992 Dependence on renal dialysis: Secondary | ICD-10-CM | POA: Diagnosis not present

## 2022-11-28 DIAGNOSIS — N186 End stage renal disease: Secondary | ICD-10-CM | POA: Diagnosis not present

## 2022-11-28 DIAGNOSIS — N2581 Secondary hyperparathyroidism of renal origin: Secondary | ICD-10-CM | POA: Diagnosis not present

## 2022-11-28 DIAGNOSIS — Z992 Dependence on renal dialysis: Secondary | ICD-10-CM | POA: Diagnosis not present

## 2022-11-30 DIAGNOSIS — N2581 Secondary hyperparathyroidism of renal origin: Secondary | ICD-10-CM | POA: Diagnosis not present

## 2022-11-30 DIAGNOSIS — N186 End stage renal disease: Secondary | ICD-10-CM | POA: Diagnosis not present

## 2022-11-30 DIAGNOSIS — Z992 Dependence on renal dialysis: Secondary | ICD-10-CM | POA: Diagnosis not present

## 2022-12-03 DIAGNOSIS — N186 End stage renal disease: Secondary | ICD-10-CM | POA: Diagnosis not present

## 2022-12-03 DIAGNOSIS — Z992 Dependence on renal dialysis: Secondary | ICD-10-CM | POA: Diagnosis not present

## 2022-12-03 DIAGNOSIS — N2581 Secondary hyperparathyroidism of renal origin: Secondary | ICD-10-CM | POA: Diagnosis not present

## 2022-12-05 DIAGNOSIS — N186 End stage renal disease: Secondary | ICD-10-CM | POA: Diagnosis not present

## 2022-12-05 DIAGNOSIS — N2581 Secondary hyperparathyroidism of renal origin: Secondary | ICD-10-CM | POA: Diagnosis not present

## 2022-12-05 DIAGNOSIS — Z992 Dependence on renal dialysis: Secondary | ICD-10-CM | POA: Diagnosis not present

## 2022-12-07 DIAGNOSIS — N2581 Secondary hyperparathyroidism of renal origin: Secondary | ICD-10-CM | POA: Diagnosis not present

## 2022-12-07 DIAGNOSIS — Z992 Dependence on renal dialysis: Secondary | ICD-10-CM | POA: Diagnosis not present

## 2022-12-07 DIAGNOSIS — N186 End stage renal disease: Secondary | ICD-10-CM | POA: Diagnosis not present

## 2022-12-10 DIAGNOSIS — Z992 Dependence on renal dialysis: Secondary | ICD-10-CM | POA: Diagnosis not present

## 2022-12-10 DIAGNOSIS — N186 End stage renal disease: Secondary | ICD-10-CM | POA: Diagnosis not present

## 2022-12-10 DIAGNOSIS — N2581 Secondary hyperparathyroidism of renal origin: Secondary | ICD-10-CM | POA: Diagnosis not present

## 2022-12-12 DIAGNOSIS — N186 End stage renal disease: Secondary | ICD-10-CM | POA: Diagnosis not present

## 2022-12-12 DIAGNOSIS — Z992 Dependence on renal dialysis: Secondary | ICD-10-CM | POA: Diagnosis not present

## 2022-12-12 DIAGNOSIS — N2581 Secondary hyperparathyroidism of renal origin: Secondary | ICD-10-CM | POA: Diagnosis not present

## 2022-12-14 DIAGNOSIS — N2581 Secondary hyperparathyroidism of renal origin: Secondary | ICD-10-CM | POA: Diagnosis not present

## 2022-12-14 DIAGNOSIS — Z992 Dependence on renal dialysis: Secondary | ICD-10-CM | POA: Diagnosis not present

## 2022-12-14 DIAGNOSIS — N186 End stage renal disease: Secondary | ICD-10-CM | POA: Diagnosis not present

## 2022-12-17 DIAGNOSIS — N186 End stage renal disease: Secondary | ICD-10-CM | POA: Diagnosis not present

## 2022-12-17 DIAGNOSIS — N2581 Secondary hyperparathyroidism of renal origin: Secondary | ICD-10-CM | POA: Diagnosis not present

## 2022-12-17 DIAGNOSIS — Z992 Dependence on renal dialysis: Secondary | ICD-10-CM | POA: Diagnosis not present

## 2022-12-19 DIAGNOSIS — Z992 Dependence on renal dialysis: Secondary | ICD-10-CM | POA: Diagnosis not present

## 2022-12-19 DIAGNOSIS — N2581 Secondary hyperparathyroidism of renal origin: Secondary | ICD-10-CM | POA: Diagnosis not present

## 2022-12-19 DIAGNOSIS — N186 End stage renal disease: Secondary | ICD-10-CM | POA: Diagnosis not present

## 2022-12-20 DIAGNOSIS — N186 End stage renal disease: Secondary | ICD-10-CM | POA: Diagnosis not present

## 2022-12-20 DIAGNOSIS — Z992 Dependence on renal dialysis: Secondary | ICD-10-CM | POA: Diagnosis not present

## 2022-12-20 DIAGNOSIS — I129 Hypertensive chronic kidney disease with stage 1 through stage 4 chronic kidney disease, or unspecified chronic kidney disease: Secondary | ICD-10-CM | POA: Diagnosis not present

## 2022-12-21 DIAGNOSIS — N186 End stage renal disease: Secondary | ICD-10-CM | POA: Diagnosis not present

## 2022-12-21 DIAGNOSIS — N2581 Secondary hyperparathyroidism of renal origin: Secondary | ICD-10-CM | POA: Diagnosis not present

## 2022-12-21 DIAGNOSIS — Z992 Dependence on renal dialysis: Secondary | ICD-10-CM | POA: Diagnosis not present

## 2022-12-24 DIAGNOSIS — Z992 Dependence on renal dialysis: Secondary | ICD-10-CM | POA: Diagnosis not present

## 2022-12-24 DIAGNOSIS — N186 End stage renal disease: Secondary | ICD-10-CM | POA: Diagnosis not present

## 2022-12-24 DIAGNOSIS — N2581 Secondary hyperparathyroidism of renal origin: Secondary | ICD-10-CM | POA: Diagnosis not present

## 2022-12-26 DIAGNOSIS — Z992 Dependence on renal dialysis: Secondary | ICD-10-CM | POA: Diagnosis not present

## 2022-12-26 DIAGNOSIS — N2581 Secondary hyperparathyroidism of renal origin: Secondary | ICD-10-CM | POA: Diagnosis not present

## 2022-12-26 DIAGNOSIS — N186 End stage renal disease: Secondary | ICD-10-CM | POA: Diagnosis not present

## 2022-12-28 DIAGNOSIS — N186 End stage renal disease: Secondary | ICD-10-CM | POA: Diagnosis not present

## 2022-12-28 DIAGNOSIS — N2581 Secondary hyperparathyroidism of renal origin: Secondary | ICD-10-CM | POA: Diagnosis not present

## 2022-12-28 DIAGNOSIS — Z992 Dependence on renal dialysis: Secondary | ICD-10-CM | POA: Diagnosis not present

## 2022-12-31 DIAGNOSIS — Z992 Dependence on renal dialysis: Secondary | ICD-10-CM | POA: Diagnosis not present

## 2022-12-31 DIAGNOSIS — N2581 Secondary hyperparathyroidism of renal origin: Secondary | ICD-10-CM | POA: Diagnosis not present

## 2022-12-31 DIAGNOSIS — N186 End stage renal disease: Secondary | ICD-10-CM | POA: Diagnosis not present

## 2023-01-02 DIAGNOSIS — N186 End stage renal disease: Secondary | ICD-10-CM | POA: Diagnosis not present

## 2023-01-02 DIAGNOSIS — N2581 Secondary hyperparathyroidism of renal origin: Secondary | ICD-10-CM | POA: Diagnosis not present

## 2023-01-02 DIAGNOSIS — Z992 Dependence on renal dialysis: Secondary | ICD-10-CM | POA: Diagnosis not present

## 2023-01-03 ENCOUNTER — Other Ambulatory Visit: Payer: Self-pay | Admitting: Internal Medicine

## 2023-01-03 DIAGNOSIS — I10 Essential (primary) hypertension: Secondary | ICD-10-CM

## 2023-01-04 DIAGNOSIS — N186 End stage renal disease: Secondary | ICD-10-CM | POA: Diagnosis not present

## 2023-01-04 DIAGNOSIS — N2581 Secondary hyperparathyroidism of renal origin: Secondary | ICD-10-CM | POA: Diagnosis not present

## 2023-01-04 DIAGNOSIS — Z992 Dependence on renal dialysis: Secondary | ICD-10-CM | POA: Diagnosis not present

## 2023-01-05 ENCOUNTER — Telehealth: Payer: Self-pay | Admitting: Internal Medicine

## 2023-01-05 DIAGNOSIS — I10 Essential (primary) hypertension: Secondary | ICD-10-CM

## 2023-01-05 NOTE — Telephone Encounter (Signed)
Let pt know I received RF request on Cinacalcet (Sensipar).  Please confirm whether he takes one tablet daily or just 3 x/wk on dialysis days.

## 2023-01-07 DIAGNOSIS — N186 End stage renal disease: Secondary | ICD-10-CM | POA: Diagnosis not present

## 2023-01-07 DIAGNOSIS — N2581 Secondary hyperparathyroidism of renal origin: Secondary | ICD-10-CM | POA: Diagnosis not present

## 2023-01-07 DIAGNOSIS — Z992 Dependence on renal dialysis: Secondary | ICD-10-CM | POA: Diagnosis not present

## 2023-01-07 MED ORDER — CINACALCET HCL 30 MG PO TABS: 30.0000 mg | ORAL_TABLET | Freq: Every day | ORAL | 2 refills | Status: AC

## 2023-01-07 NOTE — Telephone Encounter (Signed)
Called & spoke to the patient. Verified name & DOB. Patient stated that he is taking one tablet daily.

## 2023-01-09 DIAGNOSIS — Z992 Dependence on renal dialysis: Secondary | ICD-10-CM | POA: Diagnosis not present

## 2023-01-09 DIAGNOSIS — N2581 Secondary hyperparathyroidism of renal origin: Secondary | ICD-10-CM | POA: Diagnosis not present

## 2023-01-09 DIAGNOSIS — N186 End stage renal disease: Secondary | ICD-10-CM | POA: Diagnosis not present

## 2023-01-11 DIAGNOSIS — Z992 Dependence on renal dialysis: Secondary | ICD-10-CM | POA: Diagnosis not present

## 2023-01-11 DIAGNOSIS — N186 End stage renal disease: Secondary | ICD-10-CM | POA: Diagnosis not present

## 2023-01-11 DIAGNOSIS — N2581 Secondary hyperparathyroidism of renal origin: Secondary | ICD-10-CM | POA: Diagnosis not present

## 2023-01-13 DIAGNOSIS — N2581 Secondary hyperparathyroidism of renal origin: Secondary | ICD-10-CM | POA: Diagnosis not present

## 2023-01-13 DIAGNOSIS — N186 End stage renal disease: Secondary | ICD-10-CM | POA: Diagnosis not present

## 2023-01-13 DIAGNOSIS — Z992 Dependence on renal dialysis: Secondary | ICD-10-CM | POA: Diagnosis not present

## 2023-01-15 DIAGNOSIS — Z992 Dependence on renal dialysis: Secondary | ICD-10-CM | POA: Diagnosis not present

## 2023-01-15 DIAGNOSIS — N186 End stage renal disease: Secondary | ICD-10-CM | POA: Diagnosis not present

## 2023-01-15 DIAGNOSIS — N2581 Secondary hyperparathyroidism of renal origin: Secondary | ICD-10-CM | POA: Diagnosis not present

## 2023-01-18 DIAGNOSIS — N2581 Secondary hyperparathyroidism of renal origin: Secondary | ICD-10-CM | POA: Diagnosis not present

## 2023-01-18 DIAGNOSIS — Z992 Dependence on renal dialysis: Secondary | ICD-10-CM | POA: Diagnosis not present

## 2023-01-18 DIAGNOSIS — N186 End stage renal disease: Secondary | ICD-10-CM | POA: Diagnosis not present

## 2023-01-19 DIAGNOSIS — I129 Hypertensive chronic kidney disease with stage 1 through stage 4 chronic kidney disease, or unspecified chronic kidney disease: Secondary | ICD-10-CM | POA: Diagnosis not present

## 2023-01-19 DIAGNOSIS — Z992 Dependence on renal dialysis: Secondary | ICD-10-CM | POA: Diagnosis not present

## 2023-01-19 DIAGNOSIS — N186 End stage renal disease: Secondary | ICD-10-CM | POA: Diagnosis not present

## 2023-01-21 DIAGNOSIS — Z992 Dependence on renal dialysis: Secondary | ICD-10-CM | POA: Diagnosis not present

## 2023-01-21 DIAGNOSIS — N2581 Secondary hyperparathyroidism of renal origin: Secondary | ICD-10-CM | POA: Diagnosis not present

## 2023-01-21 DIAGNOSIS — N186 End stage renal disease: Secondary | ICD-10-CM | POA: Diagnosis not present

## 2023-01-23 DIAGNOSIS — N2581 Secondary hyperparathyroidism of renal origin: Secondary | ICD-10-CM | POA: Diagnosis not present

## 2023-01-23 DIAGNOSIS — Z992 Dependence on renal dialysis: Secondary | ICD-10-CM | POA: Diagnosis not present

## 2023-01-23 DIAGNOSIS — N186 End stage renal disease: Secondary | ICD-10-CM | POA: Diagnosis not present

## 2023-01-25 DIAGNOSIS — Z992 Dependence on renal dialysis: Secondary | ICD-10-CM | POA: Diagnosis not present

## 2023-01-25 DIAGNOSIS — N186 End stage renal disease: Secondary | ICD-10-CM | POA: Diagnosis not present

## 2023-01-25 DIAGNOSIS — N2581 Secondary hyperparathyroidism of renal origin: Secondary | ICD-10-CM | POA: Diagnosis not present

## 2023-01-28 DIAGNOSIS — Z992 Dependence on renal dialysis: Secondary | ICD-10-CM | POA: Diagnosis not present

## 2023-01-28 DIAGNOSIS — N186 End stage renal disease: Secondary | ICD-10-CM | POA: Diagnosis not present

## 2023-01-28 DIAGNOSIS — N2581 Secondary hyperparathyroidism of renal origin: Secondary | ICD-10-CM | POA: Diagnosis not present

## 2023-01-30 DIAGNOSIS — N186 End stage renal disease: Secondary | ICD-10-CM | POA: Diagnosis not present

## 2023-01-30 DIAGNOSIS — N2581 Secondary hyperparathyroidism of renal origin: Secondary | ICD-10-CM | POA: Diagnosis not present

## 2023-01-30 DIAGNOSIS — Z992 Dependence on renal dialysis: Secondary | ICD-10-CM | POA: Diagnosis not present

## 2023-02-01 DIAGNOSIS — Z992 Dependence on renal dialysis: Secondary | ICD-10-CM | POA: Diagnosis not present

## 2023-02-01 DIAGNOSIS — N186 End stage renal disease: Secondary | ICD-10-CM | POA: Diagnosis not present

## 2023-02-01 DIAGNOSIS — N2581 Secondary hyperparathyroidism of renal origin: Secondary | ICD-10-CM | POA: Diagnosis not present

## 2023-02-04 DIAGNOSIS — Z992 Dependence on renal dialysis: Secondary | ICD-10-CM | POA: Diagnosis not present

## 2023-02-04 DIAGNOSIS — N186 End stage renal disease: Secondary | ICD-10-CM | POA: Diagnosis not present

## 2023-02-04 DIAGNOSIS — N2581 Secondary hyperparathyroidism of renal origin: Secondary | ICD-10-CM | POA: Diagnosis not present

## 2023-02-05 ENCOUNTER — Encounter (HOSPITAL_COMMUNITY)
Admission: RE | Disposition: A | Discharge status: Home / Self Care | Payer: Self-pay | Source: Home / Self Care | Attending: Internal Medicine

## 2023-02-05 ENCOUNTER — Ambulatory Visit (HOSPITAL_COMMUNITY)
Admission: RE | Admit: 2023-02-05 | Discharge: 2023-02-05 | Disposition: A | Discharge status: Home / Self Care | Payer: Medicare HMO | Attending: Internal Medicine | Admitting: Internal Medicine

## 2023-02-05 ENCOUNTER — Other Ambulatory Visit: Payer: Self-pay

## 2023-02-05 SURGERY — A/V FISTULAGRAM
Anesthesia: LOCAL

## 2023-02-06 DIAGNOSIS — N2581 Secondary hyperparathyroidism of renal origin: Secondary | ICD-10-CM | POA: Diagnosis not present

## 2023-02-06 DIAGNOSIS — Z992 Dependence on renal dialysis: Secondary | ICD-10-CM | POA: Diagnosis not present

## 2023-02-06 DIAGNOSIS — N186 End stage renal disease: Secondary | ICD-10-CM | POA: Diagnosis not present

## 2023-02-08 DIAGNOSIS — N2581 Secondary hyperparathyroidism of renal origin: Secondary | ICD-10-CM | POA: Diagnosis not present

## 2023-02-08 DIAGNOSIS — N186 End stage renal disease: Secondary | ICD-10-CM | POA: Diagnosis not present

## 2023-02-08 DIAGNOSIS — Z992 Dependence on renal dialysis: Secondary | ICD-10-CM | POA: Diagnosis not present

## 2023-02-10 DIAGNOSIS — Z992 Dependence on renal dialysis: Secondary | ICD-10-CM | POA: Diagnosis not present

## 2023-02-10 DIAGNOSIS — N2581 Secondary hyperparathyroidism of renal origin: Secondary | ICD-10-CM | POA: Diagnosis not present

## 2023-02-10 DIAGNOSIS — N186 End stage renal disease: Secondary | ICD-10-CM | POA: Diagnosis not present

## 2023-02-12 DIAGNOSIS — Z992 Dependence on renal dialysis: Secondary | ICD-10-CM | POA: Diagnosis not present

## 2023-02-12 DIAGNOSIS — N186 End stage renal disease: Secondary | ICD-10-CM | POA: Diagnosis not present

## 2023-02-12 DIAGNOSIS — N2581 Secondary hyperparathyroidism of renal origin: Secondary | ICD-10-CM | POA: Diagnosis not present

## 2023-02-15 DIAGNOSIS — Z992 Dependence on renal dialysis: Secondary | ICD-10-CM | POA: Diagnosis not present

## 2023-02-15 DIAGNOSIS — N2581 Secondary hyperparathyroidism of renal origin: Secondary | ICD-10-CM | POA: Diagnosis not present

## 2023-02-15 DIAGNOSIS — N186 End stage renal disease: Secondary | ICD-10-CM | POA: Diagnosis not present

## 2023-02-17 DIAGNOSIS — Z992 Dependence on renal dialysis: Secondary | ICD-10-CM | POA: Diagnosis not present

## 2023-02-17 DIAGNOSIS — N2581 Secondary hyperparathyroidism of renal origin: Secondary | ICD-10-CM | POA: Diagnosis not present

## 2023-02-17 DIAGNOSIS — N186 End stage renal disease: Secondary | ICD-10-CM | POA: Diagnosis not present

## 2023-02-19 DIAGNOSIS — Z992 Dependence on renal dialysis: Secondary | ICD-10-CM | POA: Diagnosis not present

## 2023-02-19 DIAGNOSIS — N2581 Secondary hyperparathyroidism of renal origin: Secondary | ICD-10-CM | POA: Diagnosis not present

## 2023-02-19 DIAGNOSIS — I129 Hypertensive chronic kidney disease with stage 1 through stage 4 chronic kidney disease, or unspecified chronic kidney disease: Secondary | ICD-10-CM | POA: Diagnosis not present

## 2023-02-19 DIAGNOSIS — N186 End stage renal disease: Secondary | ICD-10-CM | POA: Diagnosis not present

## 2023-02-22 DIAGNOSIS — N2581 Secondary hyperparathyroidism of renal origin: Secondary | ICD-10-CM | POA: Diagnosis not present

## 2023-02-22 DIAGNOSIS — N186 End stage renal disease: Secondary | ICD-10-CM | POA: Diagnosis not present

## 2023-02-22 DIAGNOSIS — Z992 Dependence on renal dialysis: Secondary | ICD-10-CM | POA: Diagnosis not present

## 2023-02-25 DIAGNOSIS — Z992 Dependence on renal dialysis: Secondary | ICD-10-CM | POA: Diagnosis not present

## 2023-02-25 DIAGNOSIS — N186 End stage renal disease: Secondary | ICD-10-CM | POA: Diagnosis not present

## 2023-02-25 DIAGNOSIS — N2581 Secondary hyperparathyroidism of renal origin: Secondary | ICD-10-CM | POA: Diagnosis not present

## 2023-02-27 DIAGNOSIS — N186 End stage renal disease: Secondary | ICD-10-CM | POA: Diagnosis not present

## 2023-02-27 DIAGNOSIS — N2581 Secondary hyperparathyroidism of renal origin: Secondary | ICD-10-CM | POA: Diagnosis not present

## 2023-02-27 DIAGNOSIS — Z992 Dependence on renal dialysis: Secondary | ICD-10-CM | POA: Diagnosis not present

## 2023-03-01 DIAGNOSIS — N2581 Secondary hyperparathyroidism of renal origin: Secondary | ICD-10-CM | POA: Diagnosis not present

## 2023-03-01 DIAGNOSIS — N186 End stage renal disease: Secondary | ICD-10-CM | POA: Diagnosis not present

## 2023-03-01 DIAGNOSIS — Z992 Dependence on renal dialysis: Secondary | ICD-10-CM | POA: Diagnosis not present

## 2023-03-04 DIAGNOSIS — N2581 Secondary hyperparathyroidism of renal origin: Secondary | ICD-10-CM | POA: Diagnosis not present

## 2023-03-04 DIAGNOSIS — N186 End stage renal disease: Secondary | ICD-10-CM | POA: Diagnosis not present

## 2023-03-04 DIAGNOSIS — Z992 Dependence on renal dialysis: Secondary | ICD-10-CM | POA: Diagnosis not present

## 2023-03-06 DIAGNOSIS — N2581 Secondary hyperparathyroidism of renal origin: Secondary | ICD-10-CM | POA: Diagnosis not present

## 2023-03-06 DIAGNOSIS — Z992 Dependence on renal dialysis: Secondary | ICD-10-CM | POA: Diagnosis not present

## 2023-03-06 DIAGNOSIS — N186 End stage renal disease: Secondary | ICD-10-CM | POA: Diagnosis not present

## 2023-03-08 DIAGNOSIS — N186 End stage renal disease: Secondary | ICD-10-CM | POA: Diagnosis not present

## 2023-03-08 DIAGNOSIS — N2581 Secondary hyperparathyroidism of renal origin: Secondary | ICD-10-CM | POA: Diagnosis not present

## 2023-03-08 DIAGNOSIS — Z992 Dependence on renal dialysis: Secondary | ICD-10-CM | POA: Diagnosis not present

## 2023-03-11 DIAGNOSIS — N186 End stage renal disease: Secondary | ICD-10-CM | POA: Diagnosis not present

## 2023-03-11 DIAGNOSIS — N2581 Secondary hyperparathyroidism of renal origin: Secondary | ICD-10-CM | POA: Diagnosis not present

## 2023-03-11 DIAGNOSIS — Z992 Dependence on renal dialysis: Secondary | ICD-10-CM | POA: Diagnosis not present

## 2023-03-12 ENCOUNTER — Ambulatory Visit: Payer: Medicare HMO | Admitting: Internal Medicine

## 2023-03-15 DIAGNOSIS — N186 End stage renal disease: Secondary | ICD-10-CM | POA: Diagnosis not present

## 2023-03-15 DIAGNOSIS — Z992 Dependence on renal dialysis: Secondary | ICD-10-CM | POA: Diagnosis not present

## 2023-03-15 DIAGNOSIS — N2581 Secondary hyperparathyroidism of renal origin: Secondary | ICD-10-CM | POA: Diagnosis not present

## 2023-03-18 DIAGNOSIS — N2581 Secondary hyperparathyroidism of renal origin: Secondary | ICD-10-CM | POA: Diagnosis not present

## 2023-03-18 DIAGNOSIS — Z992 Dependence on renal dialysis: Secondary | ICD-10-CM | POA: Diagnosis not present

## 2023-03-18 DIAGNOSIS — N186 End stage renal disease: Secondary | ICD-10-CM | POA: Diagnosis not present

## 2023-03-20 DIAGNOSIS — N2581 Secondary hyperparathyroidism of renal origin: Secondary | ICD-10-CM | POA: Diagnosis not present

## 2023-03-20 DIAGNOSIS — N186 End stage renal disease: Secondary | ICD-10-CM | POA: Diagnosis not present

## 2023-03-20 DIAGNOSIS — Z992 Dependence on renal dialysis: Secondary | ICD-10-CM | POA: Diagnosis not present

## 2023-03-21 DIAGNOSIS — Z87891 Personal history of nicotine dependence: Secondary | ICD-10-CM | POA: Diagnosis not present

## 2023-03-21 DIAGNOSIS — R911 Solitary pulmonary nodule: Secondary | ICD-10-CM | POA: Diagnosis not present

## 2023-03-22 DIAGNOSIS — N2581 Secondary hyperparathyroidism of renal origin: Secondary | ICD-10-CM | POA: Diagnosis not present

## 2023-03-22 DIAGNOSIS — N186 End stage renal disease: Secondary | ICD-10-CM | POA: Diagnosis not present

## 2023-03-22 DIAGNOSIS — I129 Hypertensive chronic kidney disease with stage 1 through stage 4 chronic kidney disease, or unspecified chronic kidney disease: Secondary | ICD-10-CM | POA: Diagnosis not present

## 2023-03-22 DIAGNOSIS — Z992 Dependence on renal dialysis: Secondary | ICD-10-CM | POA: Diagnosis not present

## 2023-03-25 DIAGNOSIS — Z992 Dependence on renal dialysis: Secondary | ICD-10-CM | POA: Diagnosis not present

## 2023-03-25 DIAGNOSIS — N2581 Secondary hyperparathyroidism of renal origin: Secondary | ICD-10-CM | POA: Diagnosis not present

## 2023-03-25 DIAGNOSIS — N186 End stage renal disease: Secondary | ICD-10-CM | POA: Diagnosis not present

## 2023-03-27 DIAGNOSIS — Z992 Dependence on renal dialysis: Secondary | ICD-10-CM | POA: Diagnosis not present

## 2023-03-27 DIAGNOSIS — N186 End stage renal disease: Secondary | ICD-10-CM | POA: Diagnosis not present

## 2023-03-27 DIAGNOSIS — N2581 Secondary hyperparathyroidism of renal origin: Secondary | ICD-10-CM | POA: Diagnosis not present

## 2023-03-29 DIAGNOSIS — Z992 Dependence on renal dialysis: Secondary | ICD-10-CM | POA: Diagnosis not present

## 2023-03-29 DIAGNOSIS — N2581 Secondary hyperparathyroidism of renal origin: Secondary | ICD-10-CM | POA: Diagnosis not present

## 2023-03-29 DIAGNOSIS — N186 End stage renal disease: Secondary | ICD-10-CM | POA: Diagnosis not present

## 2023-04-01 DIAGNOSIS — N2581 Secondary hyperparathyroidism of renal origin: Secondary | ICD-10-CM | POA: Diagnosis not present

## 2023-04-01 DIAGNOSIS — N186 End stage renal disease: Secondary | ICD-10-CM | POA: Diagnosis not present

## 2023-04-01 DIAGNOSIS — Z992 Dependence on renal dialysis: Secondary | ICD-10-CM | POA: Diagnosis not present

## 2023-04-02 ENCOUNTER — Ambulatory Visit: Payer: Medicare HMO | Attending: Internal Medicine

## 2023-04-02 VITALS — Ht 70.0 in | Wt 225.0 lb

## 2023-04-02 DIAGNOSIS — Z5941 Food insecurity: Secondary | ICD-10-CM | POA: Diagnosis not present

## 2023-04-02 DIAGNOSIS — Z0000 Encounter for general adult medical examination without abnormal findings: Secondary | ICD-10-CM | POA: Diagnosis not present

## 2023-04-02 DIAGNOSIS — Z5986 Financial insecurity: Secondary | ICD-10-CM | POA: Diagnosis not present

## 2023-04-02 NOTE — Progress Notes (Signed)
Subjective:   Seth Keller is a 58 y.o. male who presents for Medicare Annual/Subsequent preventive examination.  Visit Complete: Virtual I connected with  Seth Keller on 04/02/23 by a audio enabled telemedicine application and verified that I am speaking with the correct person using two identifiers.  Patient Location: Home  Provider Location: Office/Clinic  I discussed the limitations of evaluation and management by telemedicine. The patient expressed understanding and agreed to proceed.  Vital Signs: Because this visit was a virtual/telehealth visit, some criteria may be missing or patient reported. Any vitals not documented were not able to be obtained and vitals that have been documented are patient reported.  Patient Medicare AWV questionnaire was completed by the patient on 03/29/2023; I have confirmed that all information answered by patient is correct and no changes since this date.  This patient declined Interactive audio and Acupuncturist. Therefore the visit was completed with audio only.  Cardiac Risk Factors include: advanced age (>6men, >64 women);family history of premature cardiovascular disease;hypertension;male gender;obesity (BMI >30kg/m2);sedentary lifestyle     Objective:    Today's Vitals   04/02/23 1043 04/02/23 1044  Weight: 225 lb (102.1 kg)   Height: 5\' 10"  (1.778 m)   PainSc: 0-No pain 0-No pain   Body mass index is 32.28 kg/m.     04/02/2023   10:45 AM 03/30/2022    1:36 PM 10/17/2021    6:26 AM 12/19/2020    2:58 PM 08/12/2020    3:59 PM 08/27/2019   10:08 AM 08/21/2016    1:58 PM  Advanced Directives  Does Patient Have a Medical Advance Directive? No No No No No No No  Would patient like information on creating a medical advance directive? No - Patient declined No - Patient declined No - Patient declined No - Patient declined  Yes (Inpatient - patient defers creating a medical advance directive at this time - Information given)     Current  Medications (verified) Outpatient Encounter Medications as of 04/02/2023  Medication Sig   aspirin EC 81 MG tablet Take 1 tablet (81 mg total) by mouth daily.   atorvastatin (LIPITOR) 80 MG tablet Take 80 mg by mouth daily.   B Complex-C-Zn-Folic Acid (DIALYVITE/ZINC) TABS Take 1 tablet by mouth at bedtime.   Blood Pressure Monitor DEVI Use as directed to check home blood pressure 2-3 times a week   Cholecalciferol (VITAMIN D) 50 MCG (2000 UT) tablet Take 2,000 Units by mouth daily.   cinacalcet (SENSIPAR) 30 MG tablet Take 1 tablet (30 mg total) by mouth daily with breakfast. 1 tab PO three times a week on dialysis days (Patient taking differently: Take 30 mg by mouth daily with breakfast.)   clopidogrel (PLAVIX) 75 MG tablet Take 1 tablet (75 mg total) by mouth daily.   furosemide (LASIX) 40 MG tablet Take 1 tablet (40 mg total) by mouth daily.   metoprolol tartrate (LOPRESSOR) 25 MG tablet Take 1 tablet (25 mg total) by mouth 2 (two) times daily.   nitroGLYCERIN (NITROSTAT) 0.4 MG SL tablet Place 1 tablet (0.4 mg total) under the tongue every 5 (five) minutes as needed for chest pain.   sucroferric oxyhydroxide (VELPHORO) 500 MG chewable tablet Chew 1,500 mg by mouth 3 (three) times daily with meals.   No facility-administered encounter medications on file as of 04/02/2023.    Allergies (verified) Iodinated contrast media and Shellfish allergy   History: Past Medical History:  Diagnosis Date   Atrial fibrillation with RVR (HCC)  a. 12/2013 post-op CABG ->on amio.   CAD (coronary artery disease)    a. 12/2013 NSTEMI/Cath: 3VD;  b. 12/2013 CABG x 4: LIMA->LAD, VG->RI, VG->OM1->LPDA.   Chronic combined systolic and diastolic CHF (congestive heart failure) (HCC)    a. 12/2013 Echo: EF 45%, Gr 2 DD.   CKD (chronic kidney disease), stage III (HCC)    Dyspnea    Hypertension    a. Dx ~ 2000.   Ischemic cardiomyopathy    a. 12/2013 Echo: EF 45%, Gr 2 DD, basal-mid inflat and inf AK,  mild MR.   Myocardial infarction [facility])    Tobacco abuse    Past Surgical History:  Procedure Laterality Date   AV FISTULA PLACEMENT Right 08/15/2020   Procedure: RIGHT ARM RADIOCEPHALIC ARTERIOVENOUS (AV) FISTULA CREATION;  Surgeon: Leonie Douglas, MD;  Location: [facility] OR;  Service: Vascular;  Laterality: Right;   BIOPSY  10/17/2021   Procedure: BIOPSY;  Surgeon: Kathi Der, MD;  Location: [facility] ENDOSCOPY;  Service: Gastroenterology;;   CARDIAC CATHETERIZATION     COLONOSCOPY WITH PROPOFOL N/A 10/17/2021   Procedure: COLONOSCOPY WITH PROPOFOL;  Surgeon: Kathi Der, MD;  Location: [facility] ENDOSCOPY;  Service: Gastroenterology;  Laterality: N/A;   CORONARY ARTERY BYPASS GRAFT N/A 01/01/2014   Procedure: CORONARY ARTERY BYPASS GRAFTING (CABG) times four using left internal mammary artery and right saphenous vein;  Surgeon: Alleen Borne, MD;  Location: [facility] OR;  Service: Open Heart Surgery;  Laterality: N/A;   INTRAOPERATIVE TRANSESOPHAGEAL ECHOCARDIOGRAM N/A 01/01/2014   Procedure: INTRAOPERATIVE TRANSESOPHAGEAL ECHOCARDIOGRAM;  Surgeon: Alleen Borne, MD;  Location: [facility] OR;  Service: Open Heart Surgery;  Laterality: N/A;   LEFT HEART CATHETERIZATION WITH CORONARY ANGIOGRAM N/A 12/29/2013   Procedure: LEFT HEART CATHETERIZATION WITH CORONARY ANGIOGRAM;  Surgeon: Lesleigh Noe, MD;  Location: [facility] CATH LAB;  Service: Cardiovascular;  Laterality: N/A;   none     POLYPECTOMY  10/17/2021   Procedure: POLYPECTOMY;  Surgeon: Kathi Der, MD;  Location: [facility] ENDOSCOPY;  Service: Gastroenterology;;   Family History  Problem Relation Age of Onset   Heart disease Mother 28   Hypertension Father 10   Asthma Father    Diabetes Paternal Grandfather    Cancer Neg Hx    Social History   Socioeconomic History   Marital status: Single    Spouse name: Not on file   Number of children: 0   Years of education: 12   Highest education level: 12th grade  Occupational History   Occupation:  Unemployed    Comment: 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   From [place], originally.   Moved to [place] in 2012.    Social Drivers of Health   Financial Resource Strain: High Risk (04/02/2023)   Overall  Financial Resource Strain (CARDIA)    Difficulty of Paying Living Expenses: Hard  Food Insecurity: Food Insecurity Present (04/02/2023)   Hunger Vital Sign    Worried About Running Out of Food in the Last Year: Sometimes true    Ran Out of Food in the Last Year: Sometimes true  Transportation Needs: No Transportation Needs (04/02/2023)   PRAPARE - Administrator, Civil Service (Medical): No    Lack of Transportation (Non-Medical): No  Physical Activity: Insufficiently Active (04/02/2023)   Exercise Vital Sign    Days of Exercise per Week: 2 days    Minutes of Exercise per Session: 10 min  Stress: No Stress Concern Present (04/02/2023)   [employer] of Occupational Health - Occupational Stress Questionnaire    Feeling of Stress : Not at all  Social Connections: Unknown (04/02/2023)   Social Connection and Isolation Panel [NHANES]    Frequency of Communication with Friends and Family: More than three times a week    Frequency of Social Gatherings with Friends and Family: More than three times a week    Attends Religious Services: Never    Doctor, general practice or Organizations: No    Attends Engineer, structural: Not on file    Marital Status: Patient declined    Tobacco Counseling Counseling given: Not Answered   Clinical Intake:  Pre-visit preparation completed: Yes  Pain : No/denies pain Pain Score: 0-No pain     BMI - recorded: 32.28 Nutritional Status: BMI > 30  Obese Nutritional Risks: None Diabetes: No  How often do you need to have someone help you when you read instructions, pamphlets, or other written materials from your doctor or pharmacy?: 1 - Never What is the last grade level you completed in school?: HSG  Interpreter Needed?: No  Information entered by :: Orrie Schubert N. Gertrude Bucks, LPN.   Activities of Daily Living    04/02/2023   10:47 AM 03/29/2023   10:51 AM  In your present state of health, do you have any difficulty performing the following activities:  Hearing? 0 0  Vision? 0 0  Difficulty concentrating or making decisions? 0 0  Walking or climbing stairs? 1 1  Dressing or bathing? 0 0  Doing errands, shopping? 0 0  Preparing Food and eating ? N N  Using the Toilet? N N  In the past six months, have you accidently leaked urine? N N  Do you have problems with loss of bowel control? N N  Managing your Medications? N N  Managing your Finances? N N  Housekeeping or managing your Housekeeping? N N    Patient Care Team: Marcine Matar, MD as PCP - General (Internal Medicine) Kathleene Hazel, MD as PCP - Cardiology (Cardiology) [facility], [facility]  Indicate any recent Medical Services you may have received from other than [facility] providers in the past year (date may be approximate).     Assessment:   This is a routine wellness examination for Seth Keller.  Hearing/Vision screen Hearing Screening - Comments:: Denies hearing difficulties. No hearing aids. Vision Screening - Comments:: Wears rx glasses - not up to date with routine eye exams.    Goals Addressed              This Visit's Progress    Client understands the importance of follow-up with providers by attending scheduled visits        Depression Screen    04/02/2023   10:46  AM 10/30/2022    9:21 AM 06/26/2022    9:48 AM 02/27/2022    9:58 AM 04/03/2021    8:32 AM 12/19/2020    3:03 PM 11/28/2020    9:58 AM  PHQ 2/9 Scores  PHQ - 2 Score 0 0 0 0 0 0 0  PHQ- 9 Score 0 0 1 1       Fall Risk    04/02/2023   11:01 AM 03/29/2023   10:51 AM 06/26/2022    9:45 AM 03/30/2022    1:36 PM 03/26/2022    3:56 PM  Fall Risk   Falls in the past year? 1 1 0 0 0  Number falls in past yr: 0 0 0 0 0  Injury with Fall? 0 0 0 0 0  Risk for fall due to : No Fall Risks  No Fall Risks No Fall Risks   Follow up Falls evaluation completed;Education provided        MEDICARE RISK AT HOME: Medicare Risk at Home Any stairs in or around the home?: Yes If so, are there any without handrails?: No Home free of loose throw rugs in walkways, pet beds, electrical cords, etc?: Yes Adequate lighting in your home to reduce risk of falls?: Yes Life alert?: No Use of a cane, walker or w/c?: No Grab bars in the bathroom?: No Shower chair or bench in shower?: No Elevated toilet seat or a handicapped toilet?: No  TIMED UP AND GO:  Was the test performed?  No    Cognitive Function:    04/02/2023   10:47 AM 03/30/2022    1:43 PM 12/19/2020    3:04 PM 08/27/2019   10:12 AM  MMSE - Mini Mental State Exam  Not completed: Unable to complete     Orientation to time  5 5 5   Orientation to Place  5 5 5   Registration  3 3 3   Attention/ Calculation  5 5 5   Recall  3 3 3   Language- name 2 objects  2 2 2   Language- repeat  1 1 1   Language- follow 3 step command  3 3 3   Language- read & follow direction  1 1 1   Write a sentence  1 1 1   Copy design  1 1 1   Total score  30 30 30         04/02/2023   10:48 AM 03/30/2022    1:46 PM  6CIT Screen  What Year? 0 points 0 points  What month? 0 points 0 points  What time?  0 points 0 points  Count back from 20 0 points 0 points  Months in reverse 0 points 0 points  Repeat phrase 0 points 0 points  Total Score 0 points 0 points    Immunizations Immunization History  Administered Date(s) Administered   Influenza,inj,Quad PF,6+ Mos 01/11/2014   PFIZER Comirnaty(Gray Top)Covid-19 Tri-Sucrose Vaccine 06/08/2020, 06/29/2020   Tdap 01/11/2014   Zoster Recombinant(Shingrix) 10/30/2022, 01/01/2023    TDAP status: Up to date  Flu Vaccine status: Declined, Education has been provided regarding the importance of this vaccine but patient still declined. Advised may receive this vaccine at local pharmacy or Health Dept. Aware to provide a copy of the vaccination record if obtained from local pharmacy or Health Dept. Verbalized acceptance and understanding.  Pneumococcal vaccine status: Declined,  Education has been provided regarding the importance of this vaccine but patient still declined. Advised may receive this vaccine at local pharmacy or Health Dept. Aware to  provide a copy of the vaccination record if obtained from local pharmacy or Health Dept. Verbalized acceptance and understanding.   Covid-19 vaccine status: Declined, Education has been provided regarding the importance of this vaccine but patient still declined. Advised may receive this vaccine at local pharmacy or Health Dept.or vaccine clinic. Aware to provide a copy of the vaccination record if obtained from local pharmacy or Health Dept. Verbalized acceptance and understanding.  Qualifies for Shingles Vaccine? Yes   Zostavax completed No   Shingrix Completed?: Yes  Screening Tests Health Maintenance  Topic Date Due   Pneumococcal Vaccine 43-2 Years old (1 of 2 - PCV) Never done   INFLUENZA VACCINE  05/20/2023 (Originally 09/20/2022)   COVID-19 Vaccine (3 - 2024-25 season) 11/15/2023 (Originally 10/21/2022)   DTaP/Tdap/Td (2 - Td or Tdap) 01/12/2024   Medicare Annual Wellness (AWV)  04/01/2024    Colonoscopy  10/18/2026   Hepatitis C Screening  Completed   HIV Screening  Completed   Zoster Vaccines- Shingrix  Completed   HPV VACCINES  Aged Out    Health Maintenance  Health Maintenance Due  Topic Date Due   Pneumococcal Vaccine 25-41 Years old (1 of 2 - PCV) Never done    Colorectal cancer screening: Type of screening: Colonoscopy. Completed 10/17/2021. Repeat every 5 years  Lung Cancer Screening: (Low Dose CT Chest recommended if Age 26-80 years, 20 pack-year currently smoking OR have quit w/in 15years.) does not qualify.   Lung Cancer Screening Referral: NO  Additional Screening:  Hepatitis C Screening: does qualify; Completed 12/19/2020  Vision Screening: Recommended annual ophthalmology exams for early detection of glaucoma and other disorders of the eye. Is the patient up to date with their annual eye exam?  No  Who is the provider or what is the name of the office in which the patient attends annual eye exams? None If pt is not established with a provider, would they like to be referred to a provider to establish care? No .   Dental Screening: Recommended annual dental exams for proper oral hygiene.  Community Resource Referral / Chronic Care Management: CRR required this visit?  No   CCM required this visit?  No     Plan:     I have personally reviewed and noted the following in the patient's chart:   Medical and social history Use of alcohol, tobacco or illicit drugs  Current medications and supplements including opioid prescriptions. Patient is not currently taking opioid prescriptions. Functional ability and status Nutritional status Physical activity Advanced directives List of other physicians Hospitalizations, surgeries, and ER visits in previous 12 months Vitals Screenings to include cognitive, depression, and falls Referrals and appointments  In addition, I have reviewed and discussed with patient certain preventive protocols, quality  metrics, and best practice recommendations. A written personalized care plan for preventive services as well as general preventive health recommendations were provided to patient.     RAGAN REALE, LPN   [id number]/84/[id number]   After Visit Summary: (MyChart) Due to this being a telephonic visit, the after visit summary with patients personalized plan was offered to patient via MyChart   Nurse Notes: Patient voiced during AWV that he needed refills on his medications.  This nurse advised patient to schedule an appointment with Dr. Laural Benes for refills.

## 2023-04-02 NOTE — Patient Instructions (Signed)
Mr. Caples , Thank you for taking time to come for your Medicare Wellness Visit. I appreciate your ongoing commitment to your health goals. Please review the following plan we discussed and let me know if I can assist you in the future.   Referrals/Orders/Follow-Ups/Clinician Recommendations: Yes; Keep maintaining your health by keeping your appointments with Dr. Laural Benes and any specialists that you may see.  Call us if you need anything.  Have a great year!!!!  This is a list of the screening recommended for you and due dates:  Health Maintenance  Topic Date Due   Pneumococcal Vaccination (1 of 2 - PCV) Never done   Flu Shot  05/20/2023*   COVID-19 Vaccine (3 - 2024-25 season) 11/15/2023*   DTaP/Tdap/Td vaccine (2 - Td or Tdap) 01/12/2024   Medicare Annual Wellness Visit  04/01/2024   Colon Cancer Screening  10/18/2026   Hepatitis C Screening  Completed   HIV Screening  Completed   Zoster (Shingles) Vaccine  Completed   HPV Vaccine  Aged Out  *Topic was postponed. The date shown is not the original due date.    Advanced directives: (Declined) Advance directive discussed with you today. Even though you declined this today, please call our office should you change your mind, and we can give you the proper paperwork for you to fill out.  Next Medicare Annual Wellness Visit scheduled for next year: Yes

## 2023-04-03 DIAGNOSIS — N186 End stage renal disease: Secondary | ICD-10-CM | POA: Diagnosis not present

## 2023-04-03 DIAGNOSIS — Z992 Dependence on renal dialysis: Secondary | ICD-10-CM | POA: Diagnosis not present

## 2023-04-03 DIAGNOSIS — N2581 Secondary hyperparathyroidism of renal origin: Secondary | ICD-10-CM | POA: Diagnosis not present

## 2023-04-04 ENCOUNTER — Telehealth: Payer: Self-pay | Admitting: *Deleted

## 2023-04-04 NOTE — Progress Notes (Signed)
Complex Care Management Note  Care Guide Note 04/04/2023 Name: Dyron Kawano MRN: [id number] DOB: 12/13/1965  Braun Rocca is a 59 y.o. year old male who sees Marcine Matar, MD for primary care. I reached out to [employer] by phone today to offer complex care management services.  Mr. Kerney was given information about Complex Care Management services today including:   The Complex Care Management services include support from the care team which includes your Nurse Care Manager, Clinical Social Worker, or Pharmacist.  The Complex Care Management team is here to help remove barriers to the health concerns and goals most important to you. Complex Care Management services are voluntary, and the patient may decline or stop services at any time by request to their care team member.   Complex Care Management Consent Status: Patient agreed to services and verbal consent obtained.   Follow up plan:  Telephone appointment with complex care management team member scheduled for:  04/30/23  Encounter Outcome:  Patient Scheduled  Gwenevere Ghazi  [facility]  [facility], [facility] Guide  Direct Dial: [phone]  Fax [phone]

## 2023-04-05 DIAGNOSIS — N186 End stage renal disease: Secondary | ICD-10-CM | POA: Diagnosis not present

## 2023-04-05 DIAGNOSIS — N2581 Secondary hyperparathyroidism of renal origin: Secondary | ICD-10-CM | POA: Diagnosis not present

## 2023-04-05 DIAGNOSIS — Z992 Dependence on renal dialysis: Secondary | ICD-10-CM | POA: Diagnosis not present

## 2023-04-08 DIAGNOSIS — N186 End stage renal disease: Secondary | ICD-10-CM | POA: Diagnosis not present

## 2023-04-08 DIAGNOSIS — N2581 Secondary hyperparathyroidism of renal origin: Secondary | ICD-10-CM | POA: Diagnosis not present

## 2023-04-08 DIAGNOSIS — Z992 Dependence on renal dialysis: Secondary | ICD-10-CM | POA: Diagnosis not present

## 2023-04-10 ENCOUNTER — Other Ambulatory Visit: Payer: Self-pay | Admitting: Nephrology

## 2023-04-10 DIAGNOSIS — N186 End stage renal disease: Secondary | ICD-10-CM | POA: Diagnosis not present

## 2023-04-10 DIAGNOSIS — N2581 Secondary hyperparathyroidism of renal origin: Secondary | ICD-10-CM | POA: Diagnosis not present

## 2023-04-10 DIAGNOSIS — Z992 Dependence on renal dialysis: Secondary | ICD-10-CM | POA: Diagnosis not present

## 2023-04-10 MED ORDER — PREDNISONE 50 MG PO TABS: ORAL_TABLET | ORAL | 1 refills | Status: DC

## 2023-04-11 ENCOUNTER — Encounter (HOSPITAL_COMMUNITY): Payer: Self-pay | Admitting: Nephrology

## 2023-04-11 ENCOUNTER — Encounter (HOSPITAL_COMMUNITY)
Admission: RE | Disposition: A | Discharge status: Home / Self Care | Payer: Self-pay | Source: Home / Self Care | Attending: Nephrology

## 2023-04-11 ENCOUNTER — Ambulatory Visit (HOSPITAL_COMMUNITY)
Admission: RE | Admit: 2023-04-11 | Discharge: 2023-04-11 | Disposition: A | Discharge status: Home / Self Care | Payer: Medicare HMO | Attending: Nephrology | Admitting: Nephrology

## 2023-04-11 ENCOUNTER — Other Ambulatory Visit: Payer: Self-pay

## 2023-04-11 DIAGNOSIS — T82858A Stenosis of vascular prosthetic devices, implants and grafts, initial encounter: Secondary | ICD-10-CM | POA: Diagnosis not present

## 2023-04-11 DIAGNOSIS — Z992 Dependence on renal dialysis: Secondary | ICD-10-CM | POA: Diagnosis not present

## 2023-04-11 DIAGNOSIS — Z79899 Other long term (current) drug therapy: Secondary | ICD-10-CM | POA: Diagnosis not present

## 2023-04-11 DIAGNOSIS — N186 End stage renal disease: Secondary | ICD-10-CM | POA: Insufficient documentation

## 2023-04-11 DIAGNOSIS — I5042 Chronic combined systolic (congestive) and diastolic (congestive) heart failure: Secondary | ICD-10-CM | POA: Diagnosis not present

## 2023-04-11 DIAGNOSIS — Z951 Presence of aortocoronary bypass graft: Secondary | ICD-10-CM | POA: Diagnosis not present

## 2023-04-11 DIAGNOSIS — N250 Renal osteodystrophy: Secondary | ICD-10-CM | POA: Diagnosis not present

## 2023-04-11 DIAGNOSIS — I255 Ischemic cardiomyopathy: Secondary | ICD-10-CM | POA: Diagnosis not present

## 2023-04-11 DIAGNOSIS — I132 Hypertensive heart and chronic kidney disease with heart failure and with stage 5 chronic kidney disease, or end stage renal disease: Secondary | ICD-10-CM | POA: Diagnosis not present

## 2023-04-11 DIAGNOSIS — I2510 Atherosclerotic heart disease of native coronary artery without angina pectoris: Secondary | ICD-10-CM | POA: Insufficient documentation

## 2023-04-11 DIAGNOSIS — Z87891 Personal history of nicotine dependence: Secondary | ICD-10-CM | POA: Insufficient documentation

## 2023-04-11 DIAGNOSIS — I4891 Unspecified atrial fibrillation: Secondary | ICD-10-CM | POA: Insufficient documentation

## 2023-04-11 DIAGNOSIS — D631 Anemia in chronic kidney disease: Secondary | ICD-10-CM | POA: Diagnosis not present

## 2023-04-11 DIAGNOSIS — Y832 Surgical operation with anastomosis, bypass or graft as the cause of abnormal reaction of the patient, or of later complication, without mention of misadventure at the time of the procedure: Secondary | ICD-10-CM | POA: Insufficient documentation

## 2023-04-11 DIAGNOSIS — I252 Old myocardial infarction: Secondary | ICD-10-CM | POA: Diagnosis not present

## 2023-04-11 DIAGNOSIS — I871 Compression of vein: Secondary | ICD-10-CM | POA: Diagnosis not present

## 2023-04-11 HISTORY — PX: PERIPHERAL VASCULAR BALLOON ANGIOPLASTY: CATH118281

## 2023-04-11 HISTORY — PX: A/V FISTULAGRAM: CATH118298

## 2023-04-11 SURGERY — A/V FISTULAGRAM
Anesthesia: LOCAL

## 2023-04-11 MED ORDER — DIPHENHYDRAMINE HCL 50 MG/ML IJ SOLN
INTRAMUSCULAR | Status: AC
  Filled 2023-04-11: qty 1

## 2023-04-11 MED ORDER — LIDOCAINE HCL (PF) 1 % IJ SOLN
INTRAMUSCULAR | Status: DC | PRN
  Administered 2023-04-11: 2 mL via SUBCUTANEOUS

## 2023-04-11 MED ORDER — HEPARIN (PORCINE) IN NACL 1000-0.9 UT/500ML-% IV SOLN
INTRAVENOUS | Status: DC | PRN
  Administered 2023-04-11: 500 mL

## 2023-04-11 MED ORDER — DIPHENHYDRAMINE HCL 50 MG/ML IJ SOLN
INTRAMUSCULAR | Status: DC | PRN
  Administered 2023-04-11: 25 mg via INTRAVENOUS

## 2023-04-11 MED ORDER — LIDOCAINE HCL (PF) 1 % IJ SOLN
INTRAMUSCULAR | Status: AC
  Filled 2023-04-11: qty 30

## 2023-04-11 MED ORDER — MIDAZOLAM HCL 2 MG/2ML IJ SOLN
INTRAMUSCULAR | Status: AC
  Filled 2023-04-11: qty 2

## 2023-04-11 MED ORDER — FENTANYL CITRATE (PF) 100 MCG/2ML IJ SOLN
INTRAMUSCULAR | Status: AC
  Filled 2023-04-11: qty 2

## 2023-04-11 MED ORDER — IODIXANOL 320 MG/ML IV SOLN
INTRAVENOUS | Status: DC | PRN
  Administered 2023-04-11: 5 mL via INTRAVENOUS

## 2023-04-11 MED ORDER — FENTANYL CITRATE (PF) 100 MCG/2ML IJ SOLN
INTRAMUSCULAR | Status: DC | PRN
  Administered 2023-04-11: 25 ug via INTRAVENOUS

## 2023-04-11 MED ORDER — MIDAZOLAM HCL 2 MG/2ML IJ SOLN
INTRAMUSCULAR | Status: DC | PRN
  Administered 2023-04-11: 1 mg via INTRAVENOUS

## 2023-04-11 SURGICAL SUPPLY — 12 items
BAG SNAP BAND KOVER 36X36 (MISCELLANEOUS) ×2 IMPLANT
BALLN MUSTANG 6.0X40 75 (BALLOONS) ×2
BALLN MUSTANG 7.0X40 75 (BALLOONS) ×2
BALLOON MUSTANG 6.0X40 75 (BALLOONS) IMPLANT
BALLOON MUSTANG 7.0X40 75 (BALLOONS) IMPLANT
CATH ANGIO 5F BER2 65CM (CATHETERS) IMPLANT
COVER DOME SNAP 22 D (MISCELLANEOUS) ×2 IMPLANT
GUIDEWIRE ANGLED .035X150CM (WIRE) IMPLANT
SHEATH PINNACLE R/O II 6F 4CM (SHEATH) IMPLANT
SYR MEDALLION 10ML (SYRINGE) IMPLANT
SYR MEDALLION 3ML (SYRINGE) IMPLANT
TRAY PV CATH (CUSTOM PROCEDURE TRAY) ×2 IMPLANT

## 2023-04-11 NOTE — Discharge Instructions (Signed)

## 2023-04-11 NOTE — Op Note (Signed)
Patient presents for concerns of pulling clot and decreased flows in his right  Cimino. On the physical exam, the fistula is hyperpulsatile at the inflow.  Does he really have a contrast allergy?  According to the patient it is only a shellfish allergy.  I do not see any CT scans in the past 5 years in the epic system but has had a PCI of coronaries at [facility] with a ultrasound shockwave C2 balloon.  Summary:  1)      The patient had successful angioplasty (7 mm Mustang FE ~18 atm) of significant 50-60% stenosis in the inflow cephalic vein site; angioplasty of the arterial limb (above juxta anastomosis) stenosis  6mm + 7mm Mustang FE ~16 ATM).  2)      Flows improved after outflow angioplasty. 3)      This right Cimino remains amenable to future percutaneous intervention as long as it remains patent at least 3 months.  Description of procedure: The arm was prepped and draped in the usual sterile fashion. The right  forearm Cimino fistula was cannulated (16109) with an 18G Angiocath needle directed in a retrograde direction in venous limb of the fistula. A guidewire was inserted and exchanged for a 6 Fr sheath. Contrast [phone]) injection via the side port of the sheath was performed. The angiogram of the fistula (09811) showed a patent outflow body of the cephalic fistula, dual upper arm drainage (95% through the medial veins), axillary vein, centrals.   The angled Glidewire was advanced and manipulated until the tip of the wire was in the proximal radial artery.  Arteriogram revealed a patent radial artery, arterial anastomosis stenosis  and a inflow limb 50-60% cephalic vein stenosis. A 6x4 and 7x4 Mustang angioplasty balloon were then inserted over the guidewire and positioned at the cephalic vein inflow stenosis.   Venous angioplasty (91478) was carried out to 14-18 ATM with FULL effacement of the waist on the balloon at the cephalic vein lesion site. The repeat angiogram showed 10% residual stenosis  with no evidence of extravasation or dissection.    Antegrade arteriogram revealed percent residual stenosis at the arterial anastomosis and 10% residual stenosis in the inflow cephalic vein segment with no evidence of extravasation or dissection.  Hemostasis: A 3-0 ethilon purse string suture was placed at the cannulation site on removal of the sheath.  Sedation: 1mg  Versed, 25 mcg Fentanyl, Benadryl 25mg . Sedation time: 21 minutes  Contrast. 5 mL  Monitoring: Because of the patient's comorbid conditions and sedation during the procedure, continuous EKG monitoring and O2 saturation monitoring was performed throughout the procedure by the RN. There were no abnormal arrhythmias encountered.  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 Continue to cannulate the fistula with 15G needles.  2. Refer for problems with flows/swelling. 3. Remove the suture next treatment.   Discharge: The patient was discharged home in stable condition. The patient was given education regarding the care of the dialysis access AVF and specific instructions in case of any problems.

## 2023-04-11 NOTE — H&P (Addendum)
Chief Complaint: Decreased flows  Interval H&P  The patient has presented today for an angiogram/ angioplasty.  Various methods of treatment have been discussed with the patient.  After consideration of risk, benefits and other options for treatment, the patient has consented to a angiogram/ angioplasty with  possible stent placement.   Risks of angiogram with potential angioplasty and stenting if needed.contrast reaction, extravasation/ bleeding, dissection, hypotension and death were explained to the patient.  The patient's history has been reviewed and the patient has been examined, no changes in status.  Stable for angiogram/angioplasty  I have reviewed the patient's chart and labs.  Questions were answered to the patient's satisfaction.  Assessment/Plan: ESRD dialyzing MWF last treatment yesterday.. Decreased access flows a right Cimino fistula- planning on angiogram with possibly angioplasty, he has an inflow lesion.  The patient denies he has a contrast allergy but has been premedicated.  He also had a coronary intervention at [facility] in 2023 but no contrast amount or documentation that it was given.  I discussed with the cardiologist here at [facility] and he said would be highly unlikely that contrast was not given.  I think in the future we should document him as having a shellfish allergy but not necessarily a contrast allergy. Renal osteodystrophy - continue binders per home regimen. Anemia - managed with ESA's and IV iron at dialysis center. HTN - resume home regimen.   HPI: Seth Keller is an 58 y.o. male history of atrial fibrillation, coronary atherosclerotic heart disease status post CABG times 23 December 2013, hypertension, history of myocardial infarction, ESRD dialyzing Monday was and Fridays with last full treatment yesterday.  Patient was infiltrated last week.  Pt denies fever, chills, nausea, vomiting, myalgias, SOB, CP.   ROS Per HPI.  Chemistry and CBC: Creat  Date/Time  Value Ref Range Status  06/16/2015 09:59 AM 1.88 (H) 0.70 - 1.33 mg/dL Final  65/78/4696 29:52 AM 1.89 (H) 0.60 - 1.35 mg/dL Final  84/13/2440 10:27 AM 2.23 (H) 0.60 - 1.35 mg/dL Final  25/36/6440 34:74 AM 1.89 (H) 0.60 - 1.35 mg/dL Final  25/95/6387 56:43 AM 2.31 (H) 0.50 - 1.35 mg/dL Final  32/95/1884 16:60 AM 1.73 (H) 0.50 - 1.35 mg/dL Final  63/02/6008 93:23 AM 1.36 (H) 0.50 - 1.35 mg/dL Final  55/73/2202 54:27 PM 1.21 0.50 - 1.35 mg/dL Final  08/12/7626 31:51 PM 1.21 0.50 - 1.35 mg/dL Final    Comment:    RESULT FROM [id number]   Creatinine, Ser  Date/Time Value Ref Range Status  10/17/2021 06:50 AM 11.50 (H) 0.61 - 1.24 mg/dL Final  62/69/4854 62:70 AM 2.74 (H) 0.76 - 1.27 mg/dL Final  35/00/9381 82:99 AM 2.14 (H) 0.76 - 1.27 mg/dL Final  37/16/9678 93:81 AM 2.41 (H) 0.76 - 1.27 mg/dL Final  01/75/1025 85:27 AM 1.98 (H) 0.76 - 1.27 mg/dL Final  78/24/2353 61:44 AM 2.13 (H) 0.76 - 1.27 mg/dL Final  31/54/0086 76:19 AM 2.25 (H) 0.76 - 1.27 mg/dL Final  50/93/2671 24:58 PM 2.29 (H) 0.76 - 1.27 mg/dL Final  09/98/3382 50:53 AM 2.09 (H) 0.40 - 1.50 mg/dL Final  97/67/3419 37:90 AM 2.17 (H) 0.40 - 1.50 mg/dL Final  24/10/7351 29:92 PM 2.6 (H) 0.4 - 1.5 mg/dL Final  42/68/3419 62:22 AM 2.6 (H) 0.4 - 1.5 mg/dL Final  97/98/9211 94:17 AM 2.09 (H) 0.50 - 1.35 mg/dL Final  40/81/4481 85:63 AM 2.29 (H) 0.50 - 1.35 mg/dL Final  14/97/0263 78:58 AM 2.61 (H) 0.50 - 1.35 mg/dL Final  85/03/7739 28:78 PM  2.30 (H) 0.50 - 1.35 mg/dL Final  29/56/2130 86:57 PM 2.22 (H) 0.50 - 1.35 mg/dL Final  84/69/6295 28:41 AM 1.82 (H) 0.50 - 1.35 mg/dL Final  32/44/0102 72:53 PM 1.76 (H) 0.50 - 1.35 mg/dL Final  66/44/0347 42:59 PM 1.80 (H) 0.50 - 1.35 mg/dL Final  56/38/7564 33:29 PM 1.90 (H) 0.50 - 1.35 mg/dL Final  51/88/4166 06:30 AM 1.90 (H) 0.50 - 1.35 mg/dL Final  16/02/930 35:57 AM 1.90 (H) 0.50 - 1.35 mg/dL Final  32/20/2542 70:62 AM 1.80 (H) 0.50 - 1.35 mg/dL Final  37/62/8315 17:61 AM 1.70  (H) 0.50 - 1.35 mg/dL Final  60/73/7106 26:94 AM 1.93 (H) 0.50 - 1.35 mg/dL Final  85/46/2703 50:09 AM 1.87 (H) 0.50 - 1.35 mg/dL Final  38/18/2993 71:69 AM 1.91 (H) 0.50 - 1.35 mg/dL Final  67/89/3810 17:51 AM 1.78 (H) 0.50 - 1.35 mg/dL Final  02/58/5277 82:42 AM 1.92 (H) 0.50 - 1.35 mg/dL Final  35/36/1443 15:40 AM 2.21 (H) 0.50 - 1.35 mg/dL Final  08/67/6195 09:32 AM 1.63 (H) 0.50 - 1.35 mg/dL Final   No results for input(s): "NA", "K", "CL", "CO2", "GLUCOSE", "BUN", "CREATININE", "CALCIUM", "PHOS" in the last 168 hours.  Invalid input(s): "ALB" No results for input(s): "WBC", "NEUTROABS", "HGB", "HCT", "MCV", "PLT" in the last 168 hours. Liver Function Tests: No results for input(s): "AST", "ALT", "ALKPHOS", "BILITOT", "PROT", "ALBUMIN" in the last 168 hours. No results for input(s): "LIPASE", "AMYLASE" in the last 168 hours. No results for input(s): "AMMONIA" in the last 168 hours. Cardiac Enzymes: No results for input(s): "CKTOTAL", "CKMB", "CKMBINDEX", "TROPONINI" in the last 168 hours. Iron Studies: No results for input(s): "IRON", "TIBC", "TRANSFERRIN", "FERRITIN" in the last 72 hours. PT/INR: @LABRCNTIP (inr:5)  Xrays/Other Studies: )No results found for this or any previous visit (from the past 48 hours). No results found.  PMH:   Past Medical History:  Diagnosis Date   Atrial fibrillation with RVR (HCC)    a. 12/2013 post-op CABG ->on amio.   CAD (coronary artery disease)    a. 12/2013 NSTEMI/Cath: 3VD;  b. 12/2013 CABG x 4: LIMA->LAD, VG->RI, VG->OM1->LPDA.   Chronic combined systolic and diastolic CHF (congestive heart failure) (HCC)    a. 12/2013 Echo: EF 45%, Gr 2 DD.   CKD (chronic kidney disease), stage III (HCC)    Dyspnea    Hypertension    a. Dx ~ 2000.   Ischemic cardiomyopathy    a. 12/2013 Echo: EF 45%, Gr 2 DD, basal-mid inflat and inf AK, mild MR.   Myocardial infarction (HCC)    Tobacco abuse     PSH:   Past Surgical History:  Procedure  Laterality Date   AV FISTULA PLACEMENT Right 08/15/2020   Procedure: RIGHT ARM RADIOCEPHALIC ARTERIOVENOUS (AV) FISTULA CREATION;  Surgeon: Leonie Douglas, MD;  [facility] OR;  Service: Vascular;  Laterality: Right;   BIOPSY  10/17/2021   Procedure: BIOPSY;  Surgeon: Kathi Der, MD;  Location: [facility] ENDOSCOPY;  Service: Gastroenterology;;   CARDIAC CATHETERIZATION     COLONOSCOPY WITH PROPOFOL N/A 10/17/2021   Procedure: COLONOSCOPY WITH PROPOFOL;  Surgeon: Kathi Der, MD;  Location: [facility];  Service: Gastroenterology;  Laterality: N/A;   CORONARY ARTERY BYPASS GRAFT N/A 01/01/2014   Procedure: CORONARY ARTERY BYPASS GRAFTING (CABG) times four using left internal mammary artery and right saphenous vein;  Surgeon: Alleen Borne, MD;  Location: [facility] OR;  Service: Open Heart Surgery;  Laterality: N/A;   INTRAOPERATIVE TRANSESOPHAGEAL ECHOCARDIOGRAM N/A 01/01/2014   Procedure: INTRAOPERATIVE  TRANSESOPHAGEAL ECHOCARDIOGRAM;  Surgeon: Alleen Borne, MD;  Location: [facility] OR;  Service: Open Heart Surgery;  Laterality: N/A;   LEFT HEART CATHETERIZATION WITH CORONARY ANGIOGRAM N/A 12/29/2013   Procedure: LEFT HEART CATHETERIZATION WITH CORONARY ANGIOGRAM;  Surgeon: Lesleigh Noe, MD;  Location: [facility] CATH LAB;  Service: Cardiovascular;  Laterality: N/A;   none     POLYPECTOMY  10/17/2021   Procedure: POLYPECTOMY;  Surgeon: Kathi Der, MD;  Location: [facility];  Service: Gastroenterology;;    Allergies:  Allergies  Allergen Reactions   Iodinated Contrast Media Anaphylaxis   Shellfish Allergy Anaphylaxis    Throat close up, swelling    Medications:   Prior to Admission medications   Medication Sig Start Date End Date Taking? Authorizing Provider  aspirin EC 81 MG tablet Take 1 tablet (81 mg total) by mouth daily. 10/27/20  Yes Ivonne Andrew, NP  atorvastatin (LIPITOR) 80 MG tablet Take 80 mg by mouth daily. 01/21/22  Yes [provider]  B Complex-C-Zn-Folic Acid  (DIALYVITE/ZINC) TABS Take 1 tablet by mouth at bedtime. 01/28/23  Yes [provider]  Cholecalciferol (VITAMIN D-3) 125 MCG (5000 UT) TABS Take 5,000 Units by mouth daily.   Yes [provider]  cinacalcet (SENSIPAR) 30 MG tablet Take 1 tablet (30 mg total) by mouth daily with breakfast. 1 tab PO three times a week on dialysis days Patient taking differently: Take 30 mg by mouth daily with breakfast. 01/07/23  Yes Marcine Matar, MD  clopidogrel (PLAVIX) 75 MG tablet Take 1 tablet (75 mg total) by mouth daily. 10/30/22  Yes Marcine Matar, MD  furosemide (LASIX) 40 MG tablet Take 1 tablet (40 mg total) by mouth daily. 10/30/22  Yes Marcine Matar, MD  metoprolol tartrate (LOPRESSOR) 25 MG tablet Take 1 tablet (25 mg total) by mouth 2 (two) times daily. 10/30/22  Yes Marcine Matar, MD  nitroGLYCERIN (NITROSTAT) 0.4 MG SL tablet Place 1 tablet (0.4 mg total) under the tongue every 5 (five) minutes as needed for chest pain. 06/26/22  Yes Marcine Matar, MD  sucroferric oxyhydroxide (VELPHORO) 500 MG chewable tablet Chew 1,500 mg by mouth 3 (three) times daily with meals.   Yes [provider]  Blood Pressure Monitor DEVI Use as directed to check home blood pressure 2-3 times a week 09/25/18   Marcine Matar, MD  predniSONE (DELTASONE) 50 MG tablet Take 1 tablet 7PM evening before procedure Take 1 tablet 11 PM evening before procedure Take 1 tablet 6-7AM day of procedure 04/10/23   Ethelene Hal, MD    Discontinued Meds:   Medications Discontinued During This Encounter  Medication Reason   Cholecalciferol (VITAMIN D) 50 MCG (2000 UT) tablet Patient Preference    Social History:  reports that he quit smoking about 9 years ago. His smoking use included cigarettes. He started smoking about 29 years ago. He has a 10 pack-year smoking history. He has never used smokeless tobacco. He reports that he does not drink alcohol and does not use drugs.  Family  History:   Family History  Problem Relation Age of Onset   Heart disease Mother 35   Hypertension Father 59   Asthma Father    Diabetes Paternal Grandfather    Cancer Neg Hx     Blood pressure 134/68, pulse 80, resp. rate 14, SpO2 98%. GEN: NAD, A&Ox3, NCAT HEENT: No conjunctival pallor, EOMI NECK: Supple, no thyromegaly LUNGS: CTA B/L no rales, rhonchi or wheezing CV:  RRR, No M/R/G ABD: SNDNT +BS  EXT: No lower extremity edema ACCESS: right Cimino pulsatile at inflow       Ethelene Hal, MD 04/11/2023, 8:09 AM

## 2023-04-12 DIAGNOSIS — Z992 Dependence on renal dialysis: Secondary | ICD-10-CM | POA: Diagnosis not present

## 2023-04-12 DIAGNOSIS — N2581 Secondary hyperparathyroidism of renal origin: Secondary | ICD-10-CM | POA: Diagnosis not present

## 2023-04-12 DIAGNOSIS — N186 End stage renal disease: Secondary | ICD-10-CM | POA: Diagnosis not present

## 2023-04-15 DIAGNOSIS — N186 End stage renal disease: Secondary | ICD-10-CM | POA: Diagnosis not present

## 2023-04-15 DIAGNOSIS — Z992 Dependence on renal dialysis: Secondary | ICD-10-CM | POA: Diagnosis not present

## 2023-04-15 DIAGNOSIS — N2581 Secondary hyperparathyroidism of renal origin: Secondary | ICD-10-CM | POA: Diagnosis not present

## 2023-04-17 DIAGNOSIS — Z992 Dependence on renal dialysis: Secondary | ICD-10-CM | POA: Diagnosis not present

## 2023-04-17 DIAGNOSIS — N186 End stage renal disease: Secondary | ICD-10-CM | POA: Diagnosis not present

## 2023-04-17 DIAGNOSIS — N2581 Secondary hyperparathyroidism of renal origin: Secondary | ICD-10-CM | POA: Diagnosis not present

## 2023-04-18 DIAGNOSIS — I2510 Atherosclerotic heart disease of native coronary artery without angina pectoris: Secondary | ICD-10-CM | POA: Diagnosis not present

## 2023-04-18 DIAGNOSIS — Z95820 Peripheral vascular angioplasty status with implants and grafts: Secondary | ICD-10-CM | POA: Diagnosis not present

## 2023-04-18 DIAGNOSIS — N186 End stage renal disease: Secondary | ICD-10-CM | POA: Diagnosis not present

## 2023-04-18 DIAGNOSIS — I25710 Atherosclerosis of autologous vein coronary artery bypass graft(s) with unstable angina pectoris: Secondary | ICD-10-CM | POA: Diagnosis not present

## 2023-04-18 DIAGNOSIS — I120 Hypertensive chronic kidney disease with stage 5 chronic kidney disease or end stage renal disease: Secondary | ICD-10-CM | POA: Diagnosis not present

## 2023-04-18 DIAGNOSIS — Z951 Presence of aortocoronary bypass graft: Secondary | ICD-10-CM | POA: Diagnosis not present

## 2023-04-18 DIAGNOSIS — E785 Hyperlipidemia, unspecified: Secondary | ICD-10-CM | POA: Diagnosis not present

## 2023-04-18 DIAGNOSIS — I444 Left anterior fascicular block: Secondary | ICD-10-CM | POA: Diagnosis not present

## 2023-04-18 DIAGNOSIS — Z91041 Radiographic dye allergy status: Secondary | ICD-10-CM | POA: Diagnosis not present

## 2023-04-18 DIAGNOSIS — R9439 Abnormal result of other cardiovascular function study: Secondary | ICD-10-CM | POA: Diagnosis not present

## 2023-04-18 DIAGNOSIS — E1122 Type 2 diabetes mellitus with diabetic chronic kidney disease: Secondary | ICD-10-CM | POA: Diagnosis not present

## 2023-04-18 DIAGNOSIS — I25110 Atherosclerotic heart disease of native coronary artery with unstable angina pectoris: Secondary | ICD-10-CM | POA: Diagnosis not present

## 2023-04-18 DIAGNOSIS — Z992 Dependence on renal dialysis: Secondary | ICD-10-CM | POA: Diagnosis not present

## 2023-04-19 DIAGNOSIS — N186 End stage renal disease: Secondary | ICD-10-CM | POA: Diagnosis not present

## 2023-04-19 DIAGNOSIS — Z992 Dependence on renal dialysis: Secondary | ICD-10-CM | POA: Diagnosis not present

## 2023-04-19 DIAGNOSIS — I129 Hypertensive chronic kidney disease with stage 1 through stage 4 chronic kidney disease, or unspecified chronic kidney disease: Secondary | ICD-10-CM | POA: Diagnosis not present

## 2023-04-19 DIAGNOSIS — N2581 Secondary hyperparathyroidism of renal origin: Secondary | ICD-10-CM | POA: Diagnosis not present

## 2023-04-22 DIAGNOSIS — Z992 Dependence on renal dialysis: Secondary | ICD-10-CM | POA: Diagnosis not present

## 2023-04-22 DIAGNOSIS — N2581 Secondary hyperparathyroidism of renal origin: Secondary | ICD-10-CM | POA: Diagnosis not present

## 2023-04-22 DIAGNOSIS — N186 End stage renal disease: Secondary | ICD-10-CM | POA: Diagnosis not present

## 2023-04-24 DIAGNOSIS — N2581 Secondary hyperparathyroidism of renal origin: Secondary | ICD-10-CM | POA: Diagnosis not present

## 2023-04-24 DIAGNOSIS — N186 End stage renal disease: Secondary | ICD-10-CM | POA: Diagnosis not present

## 2023-04-24 DIAGNOSIS — Z992 Dependence on renal dialysis: Secondary | ICD-10-CM | POA: Diagnosis not present

## 2023-04-26 DIAGNOSIS — Z992 Dependence on renal dialysis: Secondary | ICD-10-CM | POA: Diagnosis not present

## 2023-04-26 DIAGNOSIS — N186 End stage renal disease: Secondary | ICD-10-CM | POA: Diagnosis not present

## 2023-04-26 DIAGNOSIS — N2581 Secondary hyperparathyroidism of renal origin: Secondary | ICD-10-CM | POA: Diagnosis not present

## 2023-04-29 DIAGNOSIS — Z992 Dependence on renal dialysis: Secondary | ICD-10-CM | POA: Diagnosis not present

## 2023-04-29 DIAGNOSIS — N186 End stage renal disease: Secondary | ICD-10-CM | POA: Diagnosis not present

## 2023-04-29 DIAGNOSIS — N2581 Secondary hyperparathyroidism of renal origin: Secondary | ICD-10-CM | POA: Diagnosis not present

## 2023-04-30 ENCOUNTER — Ambulatory Visit: Payer: Self-pay | Admitting: Licensed Clinical Social Worker

## 2023-04-30 DIAGNOSIS — H5213 Myopia, bilateral: Secondary | ICD-10-CM | POA: Diagnosis not present

## 2023-04-30 NOTE — Patient Instructions (Signed)
 Visit Information  Thank you for taking time to visit with me today. Please don't hesitate to contact me if I can be of assistance to you.   Following are the goals we discussed today:   Goals Addressed             This Visit's Progress    Care Coordination Activities       Care Coordination Interventions: Patient stated that he sometimes runs low on food and he applied for food stamps in 2020-2021 and was only given $21 and he flel that was low for the DSS to want to know your life history. SW educated the patient on the [employer] app (GGFF), the patient downloaded while on the call and was very excited about the app. The Sw will mail the e-pass link to the patient and the patient stated that he will reapply for food stamps. The SW will also mail the food pantry resources.  The Sw went over the SDOH questions and no other needs at this time.  The SW will follow up with the patient on 05/16/2023 at 10:00 am         Our next appointment is by telephone on 05/16/2023 at 10:00 am  Please call the care guide team at [phone] if you need to cancel or reschedule your appointment.   If you are experiencing a Mental Health or Behavioral Health Crisis or need someone to talk to, please call the Suicide and Crisis Lifeline: 988 go to [facility] Urgent [facility] [address], [place] [phone]) call 911  Patient verbalizes understanding of instructions and care plan provided today and agrees to view in MyChart. Active MyChart status and patient understanding of how to access instructions and care plan via MyChart confirmed with patient.     Jeanie Cooks, PhD [facility], [facility] Social Worker Direct Dial: [phone]  Fax: [phone]

## 2023-04-30 NOTE — Patient Outreach (Signed)
 Care Coordination   Initial Visit Note   04/30/2023 Name: Seth Keller MRN: [id number] DOB: February 12, 1966  Seth Keller is a 58 y.o. year old male who sees Marcine Matar, MD for primary care. I spoke with  Seth Keller by phone today.  What matters to the patients health and wellness today?  Food insecurities     Goals Addressed             This Visit's Progress    Care Coordination Activities       Care Coordination Interventions: Patient stated that he sometimes runs low on food and he applied for food stamps in 2020-2021 and was only given $21 and he flel that was low for the DSS to want to know your life history. SW educated the patient on the [employer] app (GGFF), the patient downloaded while on the call and was very excited about the app. The Sw will mail the e-pass link to the patient and the patient stated that he will reapply for food stamps. The SW will also mail the food pantry resources.  The Sw went over the SDOH questions and no other needs at this time.  The SW will follow up with the patient on 05/16/2023 at 10:00 am         SDOH assessments and interventions completed:  Yes  SDOH Interventions Today    Flowsheet Row Most Recent Value  SDOH Interventions   Food Insecurity Interventions Community Resources Provided  [SW will provide the EPASS link to apply for food stamps]  Housing Interventions Other (Comment), Community Resources Provided  [Patient want inforamtion on section 8]  Transportation Interventions Intervention Not Indicated  Utilities Interventions Intervention Not Indicated        Care Coordination Interventions:  Yes, provided  Interventions Today    Flowsheet Row Most Recent Value  General Interventions   General Interventions Discussed/Reviewed General Interventions Discussed, [employer]  [SW will mail and email resources on section 8, food pantry and educated the platient on the GGFF app]        Follow up plan: Follow  up call scheduled for 05/16/2023 at 10:00 am    Encounter Outcome:  Patient Visit Completed   Jeanie Cooks, PhD [facility], [facility] Social Worker Direct Dial: [phone]  Fax: [phone]

## 2023-05-01 DIAGNOSIS — N2581 Secondary hyperparathyroidism of renal origin: Secondary | ICD-10-CM | POA: Diagnosis not present

## 2023-05-01 DIAGNOSIS — Z992 Dependence on renal dialysis: Secondary | ICD-10-CM | POA: Diagnosis not present

## 2023-05-01 DIAGNOSIS — N186 End stage renal disease: Secondary | ICD-10-CM | POA: Diagnosis not present

## 2023-05-03 DIAGNOSIS — N186 End stage renal disease: Secondary | ICD-10-CM | POA: Diagnosis not present

## 2023-05-03 DIAGNOSIS — N2581 Secondary hyperparathyroidism of renal origin: Secondary | ICD-10-CM | POA: Diagnosis not present

## 2023-05-03 DIAGNOSIS — Z992 Dependence on renal dialysis: Secondary | ICD-10-CM | POA: Diagnosis not present

## 2023-05-06 DIAGNOSIS — Z992 Dependence on renal dialysis: Secondary | ICD-10-CM | POA: Diagnosis not present

## 2023-05-06 DIAGNOSIS — N2581 Secondary hyperparathyroidism of renal origin: Secondary | ICD-10-CM | POA: Diagnosis not present

## 2023-05-06 DIAGNOSIS — N186 End stage renal disease: Secondary | ICD-10-CM | POA: Diagnosis not present

## 2023-05-08 DIAGNOSIS — Z992 Dependence on renal dialysis: Secondary | ICD-10-CM | POA: Diagnosis not present

## 2023-05-08 DIAGNOSIS — N2581 Secondary hyperparathyroidism of renal origin: Secondary | ICD-10-CM | POA: Diagnosis not present

## 2023-05-08 DIAGNOSIS — N186 End stage renal disease: Secondary | ICD-10-CM | POA: Diagnosis not present

## 2023-05-10 DIAGNOSIS — N2581 Secondary hyperparathyroidism of renal origin: Secondary | ICD-10-CM | POA: Diagnosis not present

## 2023-05-10 DIAGNOSIS — N186 End stage renal disease: Secondary | ICD-10-CM | POA: Diagnosis not present

## 2023-05-10 DIAGNOSIS — Z992 Dependence on renal dialysis: Secondary | ICD-10-CM | POA: Diagnosis not present

## 2023-05-13 DIAGNOSIS — N186 End stage renal disease: Secondary | ICD-10-CM | POA: Diagnosis not present

## 2023-05-13 DIAGNOSIS — N2581 Secondary hyperparathyroidism of renal origin: Secondary | ICD-10-CM | POA: Diagnosis not present

## 2023-05-13 DIAGNOSIS — Z992 Dependence on renal dialysis: Secondary | ICD-10-CM | POA: Diagnosis not present

## 2023-05-15 DIAGNOSIS — Z992 Dependence on renal dialysis: Secondary | ICD-10-CM | POA: Diagnosis not present

## 2023-05-15 DIAGNOSIS — N186 End stage renal disease: Secondary | ICD-10-CM | POA: Diagnosis not present

## 2023-05-15 DIAGNOSIS — N2581 Secondary hyperparathyroidism of renal origin: Secondary | ICD-10-CM | POA: Diagnosis not present

## 2023-05-16 ENCOUNTER — Ambulatory Visit: Payer: Self-pay | Admitting: Licensed Clinical Social Worker

## 2023-05-16 NOTE — Patient Outreach (Signed)
 Care Coordination   Follow Up Visit Note   05/16/2023 Name: Seth Keller MRN: [id number] DOB: May 06, 1965  Seth Keller is a 58 y.o. year old male who sees Marcine Matar, MD for primary care. I spoke with  Domanik Blaney by phone today.  What matters to the patients health and wellness today?  Housing and food insecurities, SDOH     Goals Addressed             This Visit's Progress    Care Coordination Activities   On track    Care Coordination Interventions: Patient stated that he sometimes runs low on food and he applied for food stamps in 2020-2021 and was only given $21 and he flel that was low for the DSS to want to know your life history. SW educated the patient on the [employer] app (GGFF), the patient downloaded while on the call and was very excited about the app. Patient stated  he will not reapply for food stamps. The SW will also re mail the food pantry resources.  Patient has concerns the high rent and stated that his current landlord does take section 8 and would like information. SW will mail section 8 information and section 8 application and housing list.  The SW went over the SDOH questions and no other needs at this time.  The SW Signe Colt) will follow up with the patient on 06/04/2023 at 1:00 pm         SDOH assessments and interventions completed:  Yes  SDOH Interventions Today    Flowsheet Row Most Recent Value  SDOH Interventions   Food Insecurity Interventions Community Resources Provided  [SW educated patient on GGFF and patient using the APP, SW will reamail the food pantry list]  Housing Interventions Community Resources Provided  [Sw will mail out Section 8 Information and application and housing list]  Transportation Interventions Intervention Not Indicated  Utilities Interventions Intervention Not Indicated        Care Coordination Interventions:  Yes, provided  Interventions Today    Flowsheet Row Most Recent Value  General  Interventions   General Interventions Discussed/Reviewed General Interventions Discussed, General Interventions Reviewed, [employer]  [SW will mail Section 8 application and information and food pantry list]        Follow up plan: Follow up call scheduled for 06/04/2023 at 1:00 pm    Encounter Outcome:  Patient Visit Completed  Jeanie Cooks, PhD [facility], [facility] Social Worker Direct Dial: [phone]  Fax: [phone]

## 2023-05-16 NOTE — Patient Instructions (Signed)
 Visit Information  Thank you for taking time to visit with me today. Please don't hesitate to contact me if I can be of assistance to you.   Following are the goals we discussed today:   Goals Addressed             This Visit's Progress    Care Coordination Activities   On track    Care Coordination Interventions: Patient stated that he sometimes runs low on food and he applied for food stamps in 2020-2021 and was only given $21 and he flel that was low for the DSS to want to know your life history. SW educated the patient on the [employer] app (GGFF), the patient downloaded while on the call and was very excited about the app. Patient stated  he will not reapply for food stamps. The SW will also re mail the food pantry resources.  Patient has concerns the high rent and stated that his current landlord does take section 8 and would like information. SW will mail section 8 information and section 8 application and housing list.  The SW went over the SDOH questions and no other needs at this time.  The SW Signe Colt) will follow up with the patient on 06/04/2023 at 1:00 pm         Our next appointment is by telephone on 06/04/2023 at 1:00 pm  Please call the care guide team at [phone] if you need to cancel or reschedule your appointment.   If you are experiencing a Mental Health or Behavioral Health Crisis or need someone to talk to, please call the Suicide and Crisis Lifeline: 988 go to [facility] Urgent [facility] [address], [place] [phone]) call 911  Patient verbalizes understanding of instructions and care plan provided today and agrees to view in MyChart. Active MyChart status and patient understanding of how to access instructions and care plan via MyChart confirmed with patient.     Jeanie Cooks, PhD [facility], [facility] Social Worker Direct Dial: [phone]  Fax:  [phone]

## 2023-05-17 DIAGNOSIS — N2581 Secondary hyperparathyroidism of renal origin: Secondary | ICD-10-CM | POA: Diagnosis not present

## 2023-05-17 DIAGNOSIS — N186 End stage renal disease: Secondary | ICD-10-CM | POA: Diagnosis not present

## 2023-05-17 DIAGNOSIS — Z992 Dependence on renal dialysis: Secondary | ICD-10-CM | POA: Diagnosis not present

## 2023-05-20 DIAGNOSIS — Z992 Dependence on renal dialysis: Secondary | ICD-10-CM | POA: Diagnosis not present

## 2023-05-20 DIAGNOSIS — N2581 Secondary hyperparathyroidism of renal origin: Secondary | ICD-10-CM | POA: Diagnosis not present

## 2023-05-20 DIAGNOSIS — I129 Hypertensive chronic kidney disease with stage 1 through stage 4 chronic kidney disease, or unspecified chronic kidney disease: Secondary | ICD-10-CM | POA: Diagnosis not present

## 2023-05-20 DIAGNOSIS — N186 End stage renal disease: Secondary | ICD-10-CM | POA: Diagnosis not present

## 2023-05-22 DIAGNOSIS — Z992 Dependence on renal dialysis: Secondary | ICD-10-CM | POA: Diagnosis not present

## 2023-05-22 DIAGNOSIS — N2581 Secondary hyperparathyroidism of renal origin: Secondary | ICD-10-CM | POA: Diagnosis not present

## 2023-05-22 DIAGNOSIS — N186 End stage renal disease: Secondary | ICD-10-CM | POA: Diagnosis not present

## 2023-05-24 DIAGNOSIS — Z992 Dependence on renal dialysis: Secondary | ICD-10-CM | POA: Diagnosis not present

## 2023-05-24 DIAGNOSIS — N186 End stage renal disease: Secondary | ICD-10-CM | POA: Diagnosis not present

## 2023-05-24 DIAGNOSIS — N2581 Secondary hyperparathyroidism of renal origin: Secondary | ICD-10-CM | POA: Diagnosis not present

## 2023-05-27 DIAGNOSIS — N2581 Secondary hyperparathyroidism of renal origin: Secondary | ICD-10-CM | POA: Diagnosis not present

## 2023-05-27 DIAGNOSIS — N186 End stage renal disease: Secondary | ICD-10-CM | POA: Diagnosis not present

## 2023-05-27 DIAGNOSIS — Z992 Dependence on renal dialysis: Secondary | ICD-10-CM | POA: Diagnosis not present

## 2023-05-29 DIAGNOSIS — Z992 Dependence on renal dialysis: Secondary | ICD-10-CM | POA: Diagnosis not present

## 2023-05-29 DIAGNOSIS — N2581 Secondary hyperparathyroidism of renal origin: Secondary | ICD-10-CM | POA: Diagnosis not present

## 2023-05-29 DIAGNOSIS — N186 End stage renal disease: Secondary | ICD-10-CM | POA: Diagnosis not present

## 2023-05-31 DIAGNOSIS — Z992 Dependence on renal dialysis: Secondary | ICD-10-CM | POA: Diagnosis not present

## 2023-05-31 DIAGNOSIS — N186 End stage renal disease: Secondary | ICD-10-CM | POA: Diagnosis not present

## 2023-05-31 DIAGNOSIS — N2581 Secondary hyperparathyroidism of renal origin: Secondary | ICD-10-CM | POA: Diagnosis not present

## 2023-06-03 ENCOUNTER — Other Ambulatory Visit

## 2023-06-03 DIAGNOSIS — N186 End stage renal disease: Secondary | ICD-10-CM | POA: Diagnosis not present

## 2023-06-03 DIAGNOSIS — Z992 Dependence on renal dialysis: Secondary | ICD-10-CM | POA: Diagnosis not present

## 2023-06-03 DIAGNOSIS — N2581 Secondary hyperparathyroidism of renal origin: Secondary | ICD-10-CM | POA: Diagnosis not present

## 2023-06-04 ENCOUNTER — Other Ambulatory Visit: Payer: Self-pay

## 2023-06-04 NOTE — Patient Instructions (Signed)
 Visit Information  Thank you for taking time to visit with me today. Please don't hesitate to contact me if I can be of assistance to you before our next scheduled appointment.  Your next care management appointment is no further scheduled appointments.    Patient has met all care management goals. Care Management case will be closed. Patient has been provided contact information should new needs arise.   Please call the care guide team at [phone] if you need to cancel, schedule, or reschedule an appointment.   Please call the Suicide and Crisis Lifeline: 988 call the [place]  National Suicide Prevention Lifeline: 312-109-7475 or TTY: (367)192-4711 TTY 7626805796) to talk to a trained counselor call 1-800-273-TALK (toll free, 24 hour hotline) go to [facility] Urgent Care [address], [place] [phone]) call 911 if you are experiencing a Mental Health or Behavioral Health Crisis or need someone to talk to.  Valora Gear, Florestine Hurl, MHA [place]  [facility] Social Worker, Population Health [phone]

## 2023-06-04 NOTE — Patient Outreach (Signed)
 Complex Care Management   Visit Note  06/04/2023  Name:  Seth Keller MRN: [id number] DOB: [birth year]  Situation: Referral received for Complex Care Management related to  follow up on resources sent  I obtained verbal consent from Patient.  Visit completed with Patient  on the phone  Background:   Past Medical History:  Diagnosis Date   Atrial fibrillation with RVR (HCC)    a. 12/2013 post-op CABG ->on amio.   CAD (coronary artery disease)    a. 12/2013 NSTEMI/Cath: 3VD;  b. 12/2013 CABG x 4: LIMA->LAD, VG->RI, VG->OM1->LPDA.   Chronic combined systolic and diastolic CHF (congestive heart failure) (HCC)    a. 12/2013 Echo: EF 45%, Gr 2 DD.   CKD (chronic kidney disease), stage III (HCC)    Dyspnea    Hypertension    a. Dx ~ 2000.   Ischemic cardiomyopathy    a. 12/2013 Echo: EF 45%, Gr 2 DD, basal-mid inflat and inf AK, mild MR.   Myocardial infarction [facility])    Tobacco abuse     Assessment: Patient Reported Symptoms:  Cognitive        Neurological      HEENT        Cardiovascular      Respiratory      Endocrine      Gastrointestinal        Genitourinary      Integumentary      Musculoskeletal          Psychosocial              04/02/2023   10:46 AM  Depression screen PHQ 2/9  Decreased Interest 0  Down, Depressed, Hopeless 0  PHQ - 2 Score 0  Altered sleeping 0  Tired, decreased energy 0  Change in appetite 0  Feeling bad or failure about yourself  0  Trouble concentrating 0  Moving slowly or fidgety/restless 0  Suicidal thoughts 0  PHQ-9 Score 0  Difficult doing work/chores Not difficult at all    There were no vitals filed for this visit.  Medications Reviewed Today   Medications were not reviewed in this encounter     Recommendation:   PCP Follow-up Patient will follow up with SW for any future needs.   Follow Up Plan:   Patient has met all care management goals. Care Management case will be closed. Patient has been provided  contact information should new needs arise.   Valora Gear, Florestine Hurl, MHA [place]  [facility] Social Worker, Population Health [phone]

## 2023-06-05 DIAGNOSIS — N186 End stage renal disease: Secondary | ICD-10-CM | POA: Diagnosis not present

## 2023-06-05 DIAGNOSIS — Z992 Dependence on renal dialysis: Secondary | ICD-10-CM | POA: Diagnosis not present

## 2023-06-05 DIAGNOSIS — N2581 Secondary hyperparathyroidism of renal origin: Secondary | ICD-10-CM | POA: Diagnosis not present

## 2023-06-07 DIAGNOSIS — Z992 Dependence on renal dialysis: Secondary | ICD-10-CM | POA: Diagnosis not present

## 2023-06-07 DIAGNOSIS — N2581 Secondary hyperparathyroidism of renal origin: Secondary | ICD-10-CM | POA: Diagnosis not present

## 2023-06-07 DIAGNOSIS — N186 End stage renal disease: Secondary | ICD-10-CM | POA: Diagnosis not present

## 2023-06-10 DIAGNOSIS — N2581 Secondary hyperparathyroidism of renal origin: Secondary | ICD-10-CM | POA: Diagnosis not present

## 2023-06-10 DIAGNOSIS — N186 End stage renal disease: Secondary | ICD-10-CM | POA: Diagnosis not present

## 2023-06-10 DIAGNOSIS — Z992 Dependence on renal dialysis: Secondary | ICD-10-CM | POA: Diagnosis not present

## 2023-06-12 DIAGNOSIS — N2581 Secondary hyperparathyroidism of renal origin: Secondary | ICD-10-CM | POA: Diagnosis not present

## 2023-06-12 DIAGNOSIS — N186 End stage renal disease: Secondary | ICD-10-CM | POA: Diagnosis not present

## 2023-06-12 DIAGNOSIS — Z992 Dependence on renal dialysis: Secondary | ICD-10-CM | POA: Diagnosis not present

## 2023-06-14 DIAGNOSIS — N2581 Secondary hyperparathyroidism of renal origin: Secondary | ICD-10-CM | POA: Diagnosis not present

## 2023-06-14 DIAGNOSIS — Z992 Dependence on renal dialysis: Secondary | ICD-10-CM | POA: Diagnosis not present

## 2023-06-14 DIAGNOSIS — N186 End stage renal disease: Secondary | ICD-10-CM | POA: Diagnosis not present

## 2023-06-17 DIAGNOSIS — Z992 Dependence on renal dialysis: Secondary | ICD-10-CM | POA: Diagnosis not present

## 2023-06-17 DIAGNOSIS — N186 End stage renal disease: Secondary | ICD-10-CM | POA: Diagnosis not present

## 2023-06-17 DIAGNOSIS — N2581 Secondary hyperparathyroidism of renal origin: Secondary | ICD-10-CM | POA: Diagnosis not present

## 2023-06-19 DIAGNOSIS — N2581 Secondary hyperparathyroidism of renal origin: Secondary | ICD-10-CM | POA: Diagnosis not present

## 2023-06-19 DIAGNOSIS — Z992 Dependence on renal dialysis: Secondary | ICD-10-CM | POA: Diagnosis not present

## 2023-06-19 DIAGNOSIS — I129 Hypertensive chronic kidney disease with stage 1 through stage 4 chronic kidney disease, or unspecified chronic kidney disease: Secondary | ICD-10-CM | POA: Diagnosis not present

## 2023-06-19 DIAGNOSIS — N186 End stage renal disease: Secondary | ICD-10-CM | POA: Diagnosis not present

## 2023-06-21 DIAGNOSIS — Z992 Dependence on renal dialysis: Secondary | ICD-10-CM | POA: Diagnosis not present

## 2023-06-21 DIAGNOSIS — N2581 Secondary hyperparathyroidism of renal origin: Secondary | ICD-10-CM | POA: Diagnosis not present

## 2023-06-21 DIAGNOSIS — N186 End stage renal disease: Secondary | ICD-10-CM | POA: Diagnosis not present

## 2023-06-24 DIAGNOSIS — Z992 Dependence on renal dialysis: Secondary | ICD-10-CM | POA: Diagnosis not present

## 2023-06-24 DIAGNOSIS — N186 End stage renal disease: Secondary | ICD-10-CM | POA: Diagnosis not present

## 2023-06-24 DIAGNOSIS — N2581 Secondary hyperparathyroidism of renal origin: Secondary | ICD-10-CM | POA: Diagnosis not present

## 2023-06-26 DIAGNOSIS — N186 End stage renal disease: Secondary | ICD-10-CM | POA: Diagnosis not present

## 2023-06-26 DIAGNOSIS — Z992 Dependence on renal dialysis: Secondary | ICD-10-CM | POA: Diagnosis not present

## 2023-06-26 DIAGNOSIS — N2581 Secondary hyperparathyroidism of renal origin: Secondary | ICD-10-CM | POA: Diagnosis not present

## 2023-06-28 DIAGNOSIS — Z992 Dependence on renal dialysis: Secondary | ICD-10-CM | POA: Diagnosis not present

## 2023-06-28 DIAGNOSIS — N186 End stage renal disease: Secondary | ICD-10-CM | POA: Diagnosis not present

## 2023-06-28 DIAGNOSIS — N2581 Secondary hyperparathyroidism of renal origin: Secondary | ICD-10-CM | POA: Diagnosis not present

## 2023-07-01 DIAGNOSIS — N186 End stage renal disease: Secondary | ICD-10-CM | POA: Diagnosis not present

## 2023-07-01 DIAGNOSIS — Z992 Dependence on renal dialysis: Secondary | ICD-10-CM | POA: Diagnosis not present

## 2023-07-01 DIAGNOSIS — N2581 Secondary hyperparathyroidism of renal origin: Secondary | ICD-10-CM | POA: Diagnosis not present

## 2023-07-03 DIAGNOSIS — Z992 Dependence on renal dialysis: Secondary | ICD-10-CM | POA: Diagnosis not present

## 2023-07-03 DIAGNOSIS — N2581 Secondary hyperparathyroidism of renal origin: Secondary | ICD-10-CM | POA: Diagnosis not present

## 2023-07-03 DIAGNOSIS — N186 End stage renal disease: Secondary | ICD-10-CM | POA: Diagnosis not present

## 2023-07-05 DIAGNOSIS — N2581 Secondary hyperparathyroidism of renal origin: Secondary | ICD-10-CM | POA: Diagnosis not present

## 2023-07-05 DIAGNOSIS — Z992 Dependence on renal dialysis: Secondary | ICD-10-CM | POA: Diagnosis not present

## 2023-07-05 DIAGNOSIS — N186 End stage renal disease: Secondary | ICD-10-CM | POA: Diagnosis not present

## 2023-07-08 DIAGNOSIS — N186 End stage renal disease: Secondary | ICD-10-CM | POA: Diagnosis not present

## 2023-07-08 DIAGNOSIS — Z992 Dependence on renal dialysis: Secondary | ICD-10-CM | POA: Diagnosis not present

## 2023-07-08 DIAGNOSIS — N2581 Secondary hyperparathyroidism of renal origin: Secondary | ICD-10-CM | POA: Diagnosis not present

## 2023-07-10 DIAGNOSIS — Z992 Dependence on renal dialysis: Secondary | ICD-10-CM | POA: Diagnosis not present

## 2023-07-10 DIAGNOSIS — N2581 Secondary hyperparathyroidism of renal origin: Secondary | ICD-10-CM | POA: Diagnosis not present

## 2023-07-10 DIAGNOSIS — N186 End stage renal disease: Secondary | ICD-10-CM | POA: Diagnosis not present

## 2023-07-12 DIAGNOSIS — N2581 Secondary hyperparathyroidism of renal origin: Secondary | ICD-10-CM | POA: Diagnosis not present

## 2023-07-12 DIAGNOSIS — N186 End stage renal disease: Secondary | ICD-10-CM | POA: Diagnosis not present

## 2023-07-12 DIAGNOSIS — Z992 Dependence on renal dialysis: Secondary | ICD-10-CM | POA: Diagnosis not present

## 2023-07-15 DIAGNOSIS — Z992 Dependence on renal dialysis: Secondary | ICD-10-CM | POA: Diagnosis not present

## 2023-07-15 DIAGNOSIS — N2581 Secondary hyperparathyroidism of renal origin: Secondary | ICD-10-CM | POA: Diagnosis not present

## 2023-07-15 DIAGNOSIS — N186 End stage renal disease: Secondary | ICD-10-CM | POA: Diagnosis not present

## 2023-07-17 DIAGNOSIS — Z992 Dependence on renal dialysis: Secondary | ICD-10-CM | POA: Diagnosis not present

## 2023-07-17 DIAGNOSIS — N186 End stage renal disease: Secondary | ICD-10-CM | POA: Diagnosis not present

## 2023-07-17 DIAGNOSIS — N2581 Secondary hyperparathyroidism of renal origin: Secondary | ICD-10-CM | POA: Diagnosis not present

## 2023-07-19 DIAGNOSIS — Z992 Dependence on renal dialysis: Secondary | ICD-10-CM | POA: Diagnosis not present

## 2023-07-19 DIAGNOSIS — N186 End stage renal disease: Secondary | ICD-10-CM | POA: Diagnosis not present

## 2023-07-19 DIAGNOSIS — N2581 Secondary hyperparathyroidism of renal origin: Secondary | ICD-10-CM | POA: Diagnosis not present

## 2023-07-20 DIAGNOSIS — N186 End stage renal disease: Secondary | ICD-10-CM | POA: Diagnosis not present

## 2023-07-20 DIAGNOSIS — I129 Hypertensive chronic kidney disease with stage 1 through stage 4 chronic kidney disease, or unspecified chronic kidney disease: Secondary | ICD-10-CM | POA: Diagnosis not present

## 2023-07-20 DIAGNOSIS — Z992 Dependence on renal dialysis: Secondary | ICD-10-CM | POA: Diagnosis not present

## 2023-07-22 DIAGNOSIS — Z992 Dependence on renal dialysis: Secondary | ICD-10-CM | POA: Diagnosis not present

## 2023-07-22 DIAGNOSIS — N186 End stage renal disease: Secondary | ICD-10-CM | POA: Diagnosis not present

## 2023-07-22 DIAGNOSIS — N2581 Secondary hyperparathyroidism of renal origin: Secondary | ICD-10-CM | POA: Diagnosis not present

## 2023-07-24 DIAGNOSIS — Z992 Dependence on renal dialysis: Secondary | ICD-10-CM | POA: Diagnosis not present

## 2023-07-24 DIAGNOSIS — N2581 Secondary hyperparathyroidism of renal origin: Secondary | ICD-10-CM | POA: Diagnosis not present

## 2023-07-24 DIAGNOSIS — N186 End stage renal disease: Secondary | ICD-10-CM | POA: Diagnosis not present

## 2023-07-26 DIAGNOSIS — Z992 Dependence on renal dialysis: Secondary | ICD-10-CM | POA: Diagnosis not present

## 2023-07-26 DIAGNOSIS — N186 End stage renal disease: Secondary | ICD-10-CM | POA: Diagnosis not present

## 2023-07-26 DIAGNOSIS — N2581 Secondary hyperparathyroidism of renal origin: Secondary | ICD-10-CM | POA: Diagnosis not present

## 2023-07-29 DIAGNOSIS — N186 End stage renal disease: Secondary | ICD-10-CM | POA: Diagnosis not present

## 2023-07-29 DIAGNOSIS — Z992 Dependence on renal dialysis: Secondary | ICD-10-CM | POA: Diagnosis not present

## 2023-07-29 DIAGNOSIS — N2581 Secondary hyperparathyroidism of renal origin: Secondary | ICD-10-CM | POA: Diagnosis not present

## 2023-07-31 DIAGNOSIS — N186 End stage renal disease: Secondary | ICD-10-CM | POA: Diagnosis not present

## 2023-07-31 DIAGNOSIS — N2581 Secondary hyperparathyroidism of renal origin: Secondary | ICD-10-CM | POA: Diagnosis not present

## 2023-07-31 DIAGNOSIS — Z992 Dependence on renal dialysis: Secondary | ICD-10-CM | POA: Diagnosis not present

## 2023-08-01 ENCOUNTER — Other Ambulatory Visit: Payer: Self-pay | Admitting: Internal Medicine

## 2023-08-01 DIAGNOSIS — I10 Essential (primary) hypertension: Secondary | ICD-10-CM

## 2023-08-01 DIAGNOSIS — I25708 Atherosclerosis of coronary artery bypass graft(s), unspecified, with other forms of angina pectoris: Secondary | ICD-10-CM

## 2023-08-02 DIAGNOSIS — Z992 Dependence on renal dialysis: Secondary | ICD-10-CM | POA: Diagnosis not present

## 2023-08-02 DIAGNOSIS — N186 End stage renal disease: Secondary | ICD-10-CM | POA: Diagnosis not present

## 2023-08-02 DIAGNOSIS — N2581 Secondary hyperparathyroidism of renal origin: Secondary | ICD-10-CM | POA: Diagnosis not present

## 2023-08-05 DIAGNOSIS — N2581 Secondary hyperparathyroidism of renal origin: Secondary | ICD-10-CM | POA: Diagnosis not present

## 2023-08-05 DIAGNOSIS — N186 End stage renal disease: Secondary | ICD-10-CM | POA: Diagnosis not present

## 2023-08-05 DIAGNOSIS — Z992 Dependence on renal dialysis: Secondary | ICD-10-CM | POA: Diagnosis not present

## 2023-08-07 DIAGNOSIS — N186 End stage renal disease: Secondary | ICD-10-CM | POA: Diagnosis not present

## 2023-08-07 DIAGNOSIS — N2581 Secondary hyperparathyroidism of renal origin: Secondary | ICD-10-CM | POA: Diagnosis not present

## 2023-08-07 DIAGNOSIS — Z992 Dependence on renal dialysis: Secondary | ICD-10-CM | POA: Diagnosis not present

## 2023-08-09 DIAGNOSIS — Z992 Dependence on renal dialysis: Secondary | ICD-10-CM | POA: Diagnosis not present

## 2023-08-09 DIAGNOSIS — N2581 Secondary hyperparathyroidism of renal origin: Secondary | ICD-10-CM | POA: Diagnosis not present

## 2023-08-09 DIAGNOSIS — N186 End stage renal disease: Secondary | ICD-10-CM | POA: Diagnosis not present

## 2023-08-12 DIAGNOSIS — N2581 Secondary hyperparathyroidism of renal origin: Secondary | ICD-10-CM | POA: Diagnosis not present

## 2023-08-12 DIAGNOSIS — Z992 Dependence on renal dialysis: Secondary | ICD-10-CM | POA: Diagnosis not present

## 2023-08-12 DIAGNOSIS — N186 End stage renal disease: Secondary | ICD-10-CM | POA: Diagnosis not present

## 2023-08-13 ENCOUNTER — Other Ambulatory Visit: Payer: Self-pay | Admitting: Internal Medicine

## 2023-08-13 DIAGNOSIS — I10 Essential (primary) hypertension: Secondary | ICD-10-CM

## 2023-08-13 DIAGNOSIS — I25708 Atherosclerosis of coronary artery bypass graft(s), unspecified, with other forms of angina pectoris: Secondary | ICD-10-CM

## 2023-08-13 NOTE — Telephone Encounter (Signed)
 Copied from CRM [id number]. Topic: Clinical - Medication Refill >> Aug 13, 2023  8:51 AM Tobias L wrote: Medication: clopidogrel  (PLAVIX ) 75 MG tablet metoprolol  tartrate (LOPRESSOR ) 25 MG Pt requesting refills, pt scheduled follow up for 10/08/23. Requesting refills until able to be seen.  Has the patient contacted their pharmacy? Yes Pt told to contact the office for further refills.  This is the patient's preferred pharmacy:  [facility] [address] ([address]), [place] - [address] [place] ([place]) [place] [address] Phone: [phone] Fax: [phone]  Is this the correct pharmacy for this prescription? Yes If no, delete pharmacy and type the correct one.   Has the prescription been filled recently? Yes  Is the patient out of the medication? No  Has the patient been seen for an appointment in the last year OR does the patient have an upcoming appointment? Yes  Can we respond through MyChart? No  Agent: Please be advised that Rx refills may take up to 3 business days. We ask that you follow-up with your pharmacy.

## 2023-08-14 DIAGNOSIS — N186 End stage renal disease: Secondary | ICD-10-CM | POA: Diagnosis not present

## 2023-08-14 DIAGNOSIS — N2581 Secondary hyperparathyroidism of renal origin: Secondary | ICD-10-CM | POA: Diagnosis not present

## 2023-08-14 DIAGNOSIS — Z992 Dependence on renal dialysis: Secondary | ICD-10-CM | POA: Diagnosis not present

## 2023-08-14 MED ORDER — CLOPIDOGREL BISULFATE 75 MG PO TABS: 75.0000 mg | ORAL_TABLET | Freq: Every day | ORAL | 0 refills | Status: DC

## 2023-08-14 NOTE — Telephone Encounter (Signed)
 Requested Prescriptions  Pending Prescriptions Disp Refills   clopidogrel  (PLAVIX ) 75 MG tablet 90 tablet     Sig: Take 1 tablet (75 mg total) by mouth daily.     Hematology: Antiplatelets - clopidogrel  Failed - 08/14/2023  3:12 PM      Failed - HCT in normal range and within 180 days    HCT  Date Value Ref Range Status  10/17/2021 39.0 39.0 - 52.0 % Final   Hematocrit  Date Value Ref Range Status  03/18/2020 38.5 37.5 - 51.0 % Final         Failed - HGB in normal range and within 180 days    Hemoglobin  Date Value Ref Range Status  10/17/2021 13.3 13.0 - 17.0 g/dL Final  98/71/7977 87.3 (L) 13.0 - 17.7 g/dL Final         Failed - PLT in normal range and within 180 days    Platelets  Date Value Ref Range Status  03/18/2020 289 150 - 450 x10E3/uL Final   Platelet Count, POC  Date Value Ref Range Status  12/26/2013 267 142 - 424 K/uL Final         Failed - Cr in normal range and within 360 days    Creat  Date Value Ref Range Status  06/16/2015 1.88 (H) 0.70 - 1.33 mg/dL Final   Creatinine, Ser  Date Value Ref Range Status  10/17/2021 11.50 (H) 0.61 - 1.24 mg/dL Final         Passed - Valid encounter within last 6 months    Recent Outpatient Visits           9 months ago Coronary artery disease of bypass graft of native heart with stable angina pectoris (HCC)   [place] [facility] Dept Of [place]. [facility] Barnie NOVAK, MD   1 year ago Essential hypertension   [place] [facility] - [facility] Dept Of [place]. [facility] Barnie B, MD   1 year ago Coronary artery disease involving native coronary artery of native heart without angina pectoris   [place] [facility] - A Dept Of [place]. [facility] Barnie NOVAK, MD   2 years ago Essential hypertension   [place] [facility] - A Dept Of [place]. [facility] Barnie NOVAK, MD   2 years ago Essential  hypertension    [facility] [place] - A Dept Of [place]. [facility] Barnie NOVAK, MD              Refused Prescriptions Disp Refills   metoprolol  tartrate (LOPRESSOR ) 25 MG tablet 60 tablet     Sig: Take 1 tablet (25 mg total) by mouth 2 (two) times daily.     Cardiovascular:  Beta Blockers Passed - 08/14/2023  3:12 PM      Passed - Last BP in normal range    BP Readings from Last 1 Encounters:  04/11/23 119/66         Passed - Last Heart Rate in normal range    Pulse Readings from Last 1 Encounters:  04/11/23 64         Passed - Valid encounter within last 6 months    Recent Outpatient Visits           9 months ago Coronary artery disease of bypass graft of native heart with stable angina pectoris (HCC)  [place] [facility] - [facility] Dept Of [place]. [facility] Vic[facility] Barnie NOVAK, MD   1 year ago Essential hypertension   [place] [facility] - [facility] Dept Of [place]. [facility] Vicci Barnie B, MD   1 year ago Coronary artery disease involving native coronary artery of native heart without angina pectoris   [place] [facility] - A Dept Of [place]. [facility] Vicci Barnie NOVAK, MD   2 years ago Essential hypertension   [place] [facility] [place] - A Dept Of [place]. [facility] Vicci Barnie NOVAK, MD   2 years ago Essential hypertension   [place] [facility] [place] - A Dept Of [place]. [facility] Vicci Barnie NOVAK, MD

## 2023-08-14 NOTE — Telephone Encounter (Signed)
 Requested medication (s) are due for refill today: yes  Requested medication (s) are on the active medication list: yes  Last refill:  10/30/22 #90 2 RF  Future visit scheduled: yes  Notes to clinic:  overdue lab work/   Requested Prescriptions  Pending Prescriptions Disp Refills   clopidogrel  (PLAVIX ) 75 MG tablet 90 tablet     Sig: Take 1 tablet (75 mg total) by mouth daily.     Hematology: Antiplatelets - clopidogrel  Failed - 08/14/2023  3:13 PM      Failed - HCT in normal range and within 180 days    HCT  Date Value Ref Range Status  10/17/2021 39.0 39.0 - 52.0 % Final   Hematocrit  Date Value Ref Range Status  03/18/2020 38.5 37.5 - 51.0 % Final         Failed - HGB in normal range and within 180 days    Hemoglobin  Date Value Ref Range Status  10/17/2021 13.3 13.0 - 17.0 g/dL Final  98/71/7977 87.3 (L) 13.0 - 17.7 g/dL Final         Failed - PLT in normal range and within 180 days    Platelets  Date Value Ref Range Status  03/18/2020 289 150 - 450 x10E3/uL Final   Platelet Count, POC  Date Value Ref Range Status  12/26/2013 267 142 - 424 K/uL Final         Failed - Cr in normal range and within 360 days    Creat  Date Value Ref Range Status  06/16/2015 1.88 (H) 0.70 - 1.33 mg/dL Final   Creatinine, Ser  Date Value Ref Range Status  10/17/2021 11.50 (H) 0.61 - 1.24 mg/dL Final         Passed - Valid encounter within last 6 months    Recent Outpatient Visits           9 months ago Coronary artery disease of bypass graft of native heart with stable angina pectoris (HCC)   [place] [facility] Dept Of [place]. [facility] Vicci Barnie NOVAK, MD   1 year ago Essential hypertension   [place] [facility] [place] - A Dept Of [place]. [facility] Vicci Barnie B, MD   1 year ago Coronary artery disease involving native coronary artery of native heart without angina pectoris   [place] [facility] - A  Dept Of [place]. [facility] Vicci Barnie NOVAK, MD   2 years ago Essential hypertension   [place] [facility] [place] - A Dept Of [place]. [facility] Vicci Barnie NOVAK, MD   2 years ago Essential hypertension   [place] [facility] - A Dept Of [place]. [facility] Vicci Barnie NOVAK, MD              Refused Prescriptions Disp Refills   metoprolol  tartrate (LOPRESSOR ) 25 MG tablet 60 tablet     Sig: Take 1 tablet (25 mg total) by mouth 2 (two) times daily.     Cardiovascular:  Beta Blockers Passed - 08/14/2023  3:13 PM      Passed - Last BP in normal range    BP Readings from Last 1 Encounters:  04/11/23 119/66         Passed - Last Heart Rate in normal range    Pulse Readings from Last 1 Encounters:  04/11/23 64  Passed - Valid encounter within last 6 months    Recent Outpatient Visits           9 months ago Coronary artery disease of bypass graft of native heart with stable angina pectoris (HCC)   [place] [facility] Dept Of [place]. [facility] Vic[facility] Barnie NOVAK, MD   1 year ago Essential hypertension   [place] [facility] [place] [facility] Dept Of [place]. [facility] Vicci Barnie B, MD   1 year ago Coronary artery disease involving native coronary artery of native heart without angina pectoris   [place] [facility] - A Dept Of [place]. [facility] Vicci Barnie NOVAK, MD   2 years ago Essential hypertension   [place] [facility] - [facility] Dept Of [place]. [facility] Vicci Barnie NOVAK, MD   2 years ago Essential hypertension    [facility] [place] - A Dept Of [place]. [facility] Vicci Barnie NOVAK, MD

## 2023-08-16 DIAGNOSIS — Z992 Dependence on renal dialysis: Secondary | ICD-10-CM | POA: Diagnosis not present

## 2023-08-16 DIAGNOSIS — N186 End stage renal disease: Secondary | ICD-10-CM | POA: Diagnosis not present

## 2023-08-16 DIAGNOSIS — N2581 Secondary hyperparathyroidism of renal origin: Secondary | ICD-10-CM | POA: Diagnosis not present

## 2023-08-19 DIAGNOSIS — I129 Hypertensive chronic kidney disease with stage 1 through stage 4 chronic kidney disease, or unspecified chronic kidney disease: Secondary | ICD-10-CM | POA: Diagnosis not present

## 2023-08-19 DIAGNOSIS — N186 End stage renal disease: Secondary | ICD-10-CM | POA: Diagnosis not present

## 2023-08-19 DIAGNOSIS — N2581 Secondary hyperparathyroidism of renal origin: Secondary | ICD-10-CM | POA: Diagnosis not present

## 2023-08-19 DIAGNOSIS — Z992 Dependence on renal dialysis: Secondary | ICD-10-CM | POA: Diagnosis not present

## 2023-08-21 DIAGNOSIS — Z992 Dependence on renal dialysis: Secondary | ICD-10-CM | POA: Diagnosis not present

## 2023-08-21 DIAGNOSIS — N186 End stage renal disease: Secondary | ICD-10-CM | POA: Diagnosis not present

## 2023-08-21 DIAGNOSIS — N2581 Secondary hyperparathyroidism of renal origin: Secondary | ICD-10-CM | POA: Diagnosis not present

## 2023-08-23 DIAGNOSIS — N186 End stage renal disease: Secondary | ICD-10-CM | POA: Diagnosis not present

## 2023-08-23 DIAGNOSIS — Z992 Dependence on renal dialysis: Secondary | ICD-10-CM | POA: Diagnosis not present

## 2023-08-23 DIAGNOSIS — N2581 Secondary hyperparathyroidism of renal origin: Secondary | ICD-10-CM | POA: Diagnosis not present

## 2023-08-26 DIAGNOSIS — N2581 Secondary hyperparathyroidism of renal origin: Secondary | ICD-10-CM | POA: Diagnosis not present

## 2023-08-26 DIAGNOSIS — Z992 Dependence on renal dialysis: Secondary | ICD-10-CM | POA: Diagnosis not present

## 2023-08-26 DIAGNOSIS — N186 End stage renal disease: Secondary | ICD-10-CM | POA: Diagnosis not present

## 2023-08-28 DIAGNOSIS — N186 End stage renal disease: Secondary | ICD-10-CM | POA: Diagnosis not present

## 2023-08-28 DIAGNOSIS — Z992 Dependence on renal dialysis: Secondary | ICD-10-CM | POA: Diagnosis not present

## 2023-08-28 DIAGNOSIS — N2581 Secondary hyperparathyroidism of renal origin: Secondary | ICD-10-CM | POA: Diagnosis not present

## 2023-08-30 DIAGNOSIS — Z992 Dependence on renal dialysis: Secondary | ICD-10-CM | POA: Diagnosis not present

## 2023-08-30 DIAGNOSIS — N2581 Secondary hyperparathyroidism of renal origin: Secondary | ICD-10-CM | POA: Diagnosis not present

## 2023-08-30 DIAGNOSIS — N186 End stage renal disease: Secondary | ICD-10-CM | POA: Diagnosis not present

## 2023-09-02 DIAGNOSIS — N2581 Secondary hyperparathyroidism of renal origin: Secondary | ICD-10-CM | POA: Diagnosis not present

## 2023-09-02 DIAGNOSIS — N186 End stage renal disease: Secondary | ICD-10-CM | POA: Diagnosis not present

## 2023-09-02 DIAGNOSIS — Z992 Dependence on renal dialysis: Secondary | ICD-10-CM | POA: Diagnosis not present

## 2023-09-03 ENCOUNTER — Other Ambulatory Visit: Payer: Self-pay | Admitting: Internal Medicine

## 2023-09-03 DIAGNOSIS — I25708 Atherosclerosis of coronary artery bypass graft(s), unspecified, with other forms of angina pectoris: Secondary | ICD-10-CM

## 2023-09-04 DIAGNOSIS — N186 End stage renal disease: Secondary | ICD-10-CM | POA: Diagnosis not present

## 2023-09-04 DIAGNOSIS — Z992 Dependence on renal dialysis: Secondary | ICD-10-CM | POA: Diagnosis not present

## 2023-09-04 DIAGNOSIS — N2581 Secondary hyperparathyroidism of renal origin: Secondary | ICD-10-CM | POA: Diagnosis not present

## 2023-09-06 DIAGNOSIS — N186 End stage renal disease: Secondary | ICD-10-CM | POA: Diagnosis not present

## 2023-09-06 DIAGNOSIS — Z992 Dependence on renal dialysis: Secondary | ICD-10-CM | POA: Diagnosis not present

## 2023-09-06 DIAGNOSIS — N2581 Secondary hyperparathyroidism of renal origin: Secondary | ICD-10-CM | POA: Diagnosis not present

## 2023-09-09 DIAGNOSIS — N186 End stage renal disease: Secondary | ICD-10-CM | POA: Diagnosis not present

## 2023-09-09 DIAGNOSIS — Z992 Dependence on renal dialysis: Secondary | ICD-10-CM | POA: Diagnosis not present

## 2023-09-09 DIAGNOSIS — N2581 Secondary hyperparathyroidism of renal origin: Secondary | ICD-10-CM | POA: Diagnosis not present

## 2023-09-11 DIAGNOSIS — Z992 Dependence on renal dialysis: Secondary | ICD-10-CM | POA: Diagnosis not present

## 2023-09-11 DIAGNOSIS — N2581 Secondary hyperparathyroidism of renal origin: Secondary | ICD-10-CM | POA: Diagnosis not present

## 2023-09-11 DIAGNOSIS — N186 End stage renal disease: Secondary | ICD-10-CM | POA: Diagnosis not present

## 2023-09-13 DIAGNOSIS — Z992 Dependence on renal dialysis: Secondary | ICD-10-CM | POA: Diagnosis not present

## 2023-09-13 DIAGNOSIS — N2581 Secondary hyperparathyroidism of renal origin: Secondary | ICD-10-CM | POA: Diagnosis not present

## 2023-09-13 DIAGNOSIS — N186 End stage renal disease: Secondary | ICD-10-CM | POA: Diagnosis not present

## 2023-09-16 DIAGNOSIS — N2581 Secondary hyperparathyroidism of renal origin: Secondary | ICD-10-CM | POA: Diagnosis not present

## 2023-09-16 DIAGNOSIS — Z992 Dependence on renal dialysis: Secondary | ICD-10-CM | POA: Diagnosis not present

## 2023-09-16 DIAGNOSIS — N186 End stage renal disease: Secondary | ICD-10-CM | POA: Diagnosis not present

## 2023-09-18 DIAGNOSIS — N186 End stage renal disease: Secondary | ICD-10-CM | POA: Diagnosis not present

## 2023-09-18 DIAGNOSIS — N2581 Secondary hyperparathyroidism of renal origin: Secondary | ICD-10-CM | POA: Diagnosis not present

## 2023-09-18 DIAGNOSIS — Z992 Dependence on renal dialysis: Secondary | ICD-10-CM | POA: Diagnosis not present

## 2023-09-19 DIAGNOSIS — Z992 Dependence on renal dialysis: Secondary | ICD-10-CM | POA: Diagnosis not present

## 2023-09-19 DIAGNOSIS — I129 Hypertensive chronic kidney disease with stage 1 through stage 4 chronic kidney disease, or unspecified chronic kidney disease: Secondary | ICD-10-CM | POA: Diagnosis not present

## 2023-09-19 DIAGNOSIS — N186 End stage renal disease: Secondary | ICD-10-CM | POA: Diagnosis not present

## 2023-09-20 DIAGNOSIS — Z992 Dependence on renal dialysis: Secondary | ICD-10-CM | POA: Diagnosis not present

## 2023-09-20 DIAGNOSIS — N2581 Secondary hyperparathyroidism of renal origin: Secondary | ICD-10-CM | POA: Diagnosis not present

## 2023-09-20 DIAGNOSIS — N186 End stage renal disease: Secondary | ICD-10-CM | POA: Diagnosis not present

## 2023-09-23 DIAGNOSIS — Z992 Dependence on renal dialysis: Secondary | ICD-10-CM | POA: Diagnosis not present

## 2023-09-23 DIAGNOSIS — N2581 Secondary hyperparathyroidism of renal origin: Secondary | ICD-10-CM | POA: Diagnosis not present

## 2023-09-23 DIAGNOSIS — N186 End stage renal disease: Secondary | ICD-10-CM | POA: Diagnosis not present

## 2023-09-25 DIAGNOSIS — N186 End stage renal disease: Secondary | ICD-10-CM | POA: Diagnosis not present

## 2023-09-25 DIAGNOSIS — N2581 Secondary hyperparathyroidism of renal origin: Secondary | ICD-10-CM | POA: Diagnosis not present

## 2023-09-25 DIAGNOSIS — Z992 Dependence on renal dialysis: Secondary | ICD-10-CM | POA: Diagnosis not present

## 2023-09-27 DIAGNOSIS — N186 End stage renal disease: Secondary | ICD-10-CM | POA: Diagnosis not present

## 2023-09-27 DIAGNOSIS — Z992 Dependence on renal dialysis: Secondary | ICD-10-CM | POA: Diagnosis not present

## 2023-09-27 DIAGNOSIS — N2581 Secondary hyperparathyroidism of renal origin: Secondary | ICD-10-CM | POA: Diagnosis not present

## 2023-09-30 DIAGNOSIS — N2581 Secondary hyperparathyroidism of renal origin: Secondary | ICD-10-CM | POA: Diagnosis not present

## 2023-09-30 DIAGNOSIS — N186 End stage renal disease: Secondary | ICD-10-CM | POA: Diagnosis not present

## 2023-09-30 DIAGNOSIS — Z992 Dependence on renal dialysis: Secondary | ICD-10-CM | POA: Diagnosis not present

## 2023-10-02 DIAGNOSIS — N186 End stage renal disease: Secondary | ICD-10-CM | POA: Diagnosis not present

## 2023-10-02 DIAGNOSIS — N2581 Secondary hyperparathyroidism of renal origin: Secondary | ICD-10-CM | POA: Diagnosis not present

## 2023-10-02 DIAGNOSIS — Z992 Dependence on renal dialysis: Secondary | ICD-10-CM | POA: Diagnosis not present

## 2023-10-04 DIAGNOSIS — N2581 Secondary hyperparathyroidism of renal origin: Secondary | ICD-10-CM | POA: Diagnosis not present

## 2023-10-04 DIAGNOSIS — Z992 Dependence on renal dialysis: Secondary | ICD-10-CM | POA: Diagnosis not present

## 2023-10-04 DIAGNOSIS — N186 End stage renal disease: Secondary | ICD-10-CM | POA: Diagnosis not present

## 2023-10-07 ENCOUNTER — Telehealth: Payer: Self-pay | Admitting: Internal Medicine

## 2023-10-07 DIAGNOSIS — Z992 Dependence on renal dialysis: Secondary | ICD-10-CM | POA: Diagnosis not present

## 2023-10-07 DIAGNOSIS — N186 End stage renal disease: Secondary | ICD-10-CM | POA: Diagnosis not present

## 2023-10-07 DIAGNOSIS — N2581 Secondary hyperparathyroidism of renal origin: Secondary | ICD-10-CM | POA: Diagnosis not present

## 2023-10-07 NOTE — Telephone Encounter (Signed)
 Confirmed appt for 8/19

## 2023-10-08 ENCOUNTER — Encounter: Payer: Self-pay | Admitting: Internal Medicine

## 2023-10-08 ENCOUNTER — Ambulatory Visit: Attending: Internal Medicine | Admitting: Internal Medicine

## 2023-10-08 VITALS — BP 101/66 | HR 60 | Temp 98.1°F | Ht 70.0 in | Wt 218.0 lb

## 2023-10-08 DIAGNOSIS — Z87891 Personal history of nicotine dependence: Secondary | ICD-10-CM | POA: Diagnosis not present

## 2023-10-08 DIAGNOSIS — Z79899 Other long term (current) drug therapy: Secondary | ICD-10-CM | POA: Diagnosis not present

## 2023-10-08 DIAGNOSIS — Z2821 Immunization not carried out because of patient refusal: Secondary | ICD-10-CM

## 2023-10-08 DIAGNOSIS — I25118 Atherosclerotic heart disease of native coronary artery with other forms of angina pectoris: Secondary | ICD-10-CM

## 2023-10-08 DIAGNOSIS — I10 Essential (primary) hypertension: Secondary | ICD-10-CM | POA: Diagnosis not present

## 2023-10-08 DIAGNOSIS — I5042 Chronic combined systolic (congestive) and diastolic (congestive) heart failure: Secondary | ICD-10-CM

## 2023-10-08 DIAGNOSIS — Z992 Dependence on renal dialysis: Secondary | ICD-10-CM

## 2023-10-08 DIAGNOSIS — Z7902 Long term (current) use of antithrombotics/antiplatelets: Secondary | ICD-10-CM

## 2023-10-08 DIAGNOSIS — N186 End stage renal disease: Secondary | ICD-10-CM

## 2023-10-08 MED ORDER — FUROSEMIDE 40 MG PO TABS: 40.0000 mg | ORAL_TABLET | Freq: Every day | ORAL | 2 refills | Status: DC

## 2023-10-08 MED ORDER — CLOPIDOGREL BISULFATE 75 MG PO TABS: 75.0000 mg | ORAL_TABLET | Freq: Every day | ORAL | 1 refills | Status: DC

## 2023-10-08 MED ORDER — METOPROLOL TARTRATE 25 MG PO TABS: 12.5000 mg | ORAL_TABLET | Freq: Two times a day (BID) | ORAL | 2 refills | Status: DC

## 2023-10-08 MED ORDER — ATORVASTATIN CALCIUM 80 MG PO TABS: 80.0000 mg | ORAL_TABLET | Freq: Every day | ORAL | 1 refills | Status: DC

## 2023-10-08 NOTE — Progress Notes (Signed)
 Patient ID: Seth Keller, male    DOB: 04-13-1965  MRN: [id number]  CC: Hypertension (HTN f/u. Thompson metoprolol  dosage change /)   Subjective: Seth Keller is a 58 y.o. male who presents for chronic ds management. Kichi, his fiance is with him. His concerns today include:  pt with hx of CAD s/p CABG x 4 (Dr. 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   Discussed the use of AI scribe software for clinical note transcription with the patient, who gave verbal consent to proceed.  History of Present Illness   Seth Keller is a 58 year old male with hypertension, coronary artery disease, heart failure, and end-stage renal disease who presents for follow-up.  HTN/CAD/CHF: He continues to use his medications, including aspirin , atorvastatin  80 mg daily, clopidogrel , furosemide  40 mg daily, and metoprolol  25 mg twice a day. He underwent a left heart catheterization in February 2025 at [facility] as part of his workup for a kidney transplant, but reports that he was removed from the transplant list because he was told his heart was too weak. -LHC showed proximal and mid LAD lesion of 80%, second diagonal lesion of 99% occlusion and original lesion of 100%.  Echo done [facility] 03/12/2023 showed LVEF of 60 to 65% heart function.  Has follow-up appointment with the cardiologist Dr. Maurie at [facility] later this mth  He experiences chest pain primarily with exercise and uses nitroglycerin  as needed, with the last use being four days ago. He has persistent shortness of breath but no swelling in the legs. His blood pressure at home is typically around 100-103/60-70 mmHg, and he checks it three times a week. During dialysis, his blood pressure tends to drop significantly, requiring inversion during treatment. Told that he needs to have dose of Metoprolol  adjusted because of this.  ESRD: He  continues dialysis three times a week on Keller, Wednesday, and Friday. He does not take any medications before dialysis sessions.   HM: Declines flu and PCV 20 vaccines.  Had all 3 hep B [facility].   Patient Active Problem List   Diagnosis Date Noted   ESRD on hemodialysis (HCC) 11/28/2020   Influenza vaccine refused 03/18/2020   Bradycardia 09/25/2018   Colon polyps 08/14/2018   Anemia of chronic renal failure 06/04/2018   Secondary hyperparathyroidism of renal origin (HCC) 06/04/2018   Chronic bilateral low back pain without sciatica 12/22/2015   Obesity (BMI 30-39.9) 06/16/2015   SOB (shortness of breath) 09/27/2014   Left shoulder pain 09/08/2014   Coronary artery disease of bypass graft of native heart with stable angina pectoris (HCC)    Ischemic cardiomyopathy    Chronic combined systolic and diastolic CHF (congestive heart failure) (HCC)    Tobacco abuse, in remission    Constipation 01/11/2014   S/P CABG x 4 01/01/2014   Essential hypertension 05/08/2012   Microhematuria 05/08/2012   Proteinuria 05/08/2012     Current Outpatient Medications on File Prior to Visit  Medication Sig Dispense Refill   aspirin  EC 81 MG tablet Take 1 tablet (81 mg total) by mouth daily. 90 tablet 3   B Complex-C-Zn-Folic Acid (DIALYVITE/ZINC) TABS Take 1 tablet by mouth at bedtime.     Blood Pressure Monitor DEVI Use as directed to check home blood pressure 2-3 times a week 1 Device  0   Cholecalciferol (VITAMIN D -3) 125 MCG (5000 UT) TABS Take 5,000 Units by mouth daily.     cinacalcet  (SENSIPAR ) 30 MG tablet Take 1 tablet (30 mg total) by mouth daily with breakfast. 1 tab PO three times a week on dialysis days (Patient taking differently: Take 30 mg by mouth daily with breakfast.) 90 tablet 2   nitroGLYCERIN  (NITROSTAT ) 0.4 MG SL tablet Place 1 tablet (0.4 mg total) under the tongue every 5 (five) minutes as needed for chest pain. 90 tablet 3   sucroferric oxyhydroxide  (VELPHORO) 500 MG chewable tablet Chew 1,500 mg by mouth 3 (three) times daily with meals.     No current facility-administered medications on file prior to visit.    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   From [place] , originally.   Moved to [place] in 2012.    Social Drivers of Health   Financial Resource Strain: Medium Risk (10/07/2023)   Overall Financial Resource Strain (CARDIA)    Difficulty of Paying Living Expenses: Somewhat hard  Food Insecurity: Food Insecurity Present (10/07/2023)   Hunger Vital Sign    Worried About Running Out of Food in the Last Year: Never true    Ran Out of Food in the Last Year: Sometimes true  Transportation Needs: No Transportation Needs (10/07/2023)   PRAPARE - Administrator, Civil Service (Medical): No    Lack of Transportation (Non-Medical): No  Physical Activity: Insufficiently Active (10/07/2023)   Exercise Vital Sign    Days of Exercise per  Week: 3 days    Minutes of Exercise per Session: 10 min  Stress: No Stress Concern Present (10/07/2023)   [employer] of Occupational Health - Occupational Stress Questionnaire    Feeling of Stress: Only a little  Social Connections: Moderately Isolated (10/07/2023)   Social Connection and Isolation Panel    Frequency of Communication with Friends and Family: More than three times a week    Frequency of Social Gatherings with Friends and Family: Patient declined    Attends Religious Services: Never    Database administrator or Organizations: No    Attends Banker Meetings: Not on file    Marital Status: Living with partner  Intimate Partner Violence: Not At Risk (05/16/2023)   Humiliation, Afraid, Rape, and Kick  questionnaire    Fear of Current or Ex-Partner: No    Emotionally Abused: No    Physically Abused: No    Sexually Abused: No    Family History  Problem Relation Age of Onset   Heart disease Mother 69   Hypertension Father 71   Asthma Father    Diabetes Paternal Grandfather    Cancer Neg Hx     Past Surgical History:  Procedure Laterality Date   A/V FISTULAGRAM N/A 04/11/2023   Procedure: A/V Fistulagram;  Surgeon: Melia Lynwood ORN, MD;  [facility] INV[facility] CV LAB;  Service: Cardiovascular;  Laterality: N/A;   AV FISTULA PLACEMENT Right 08/15/2020   Procedure: RIGHT ARM RADIOCEPHALIC ARTERIOVENOUS (AV) FISTULA CREATION;  Surgeon: Magda Debby SAILOR, MD;  Location: [facility] OR;  Service: Vascular;  Laterality: Right;   BIOPSY  10/17/2021   Procedure: BIOPSY;  Surgeon: Elicia Claw, MD;  Location: [facility] ENDOSCOPY;  Service: Gastroenterology;;   CARDIAC CATHETERIZATION     COLONOSCOPY WITH PROPOFOL  N/A 10/17/2021   Procedure: COLONOSCOPY WITH PROPOFOL ;  Surgeon: Elicia Claw, MD;  Location: [facility] ENDOSCOPY;  Service: Gastroenterology;  Laterality: N/A;   CORONARY ARTERY BYPASS GRAFT N/A 01/01/2014   Procedure: CORONARY ARTERY BYPASS GRAFTING (CABG) times four using  left internal mammary artery and right saphenous vein;  Surgeon: Dorise MARLA Fellers, MD;  Location: [facility] OR;  Service: Open Heart Surgery;  Laterality: N/A;   INTRAOPERATIVE TRANSESOPHAGEAL ECHOCARDIOGRAM N/A 01/01/2014   Procedure: INTRAOPERATIVE TRANSESOPHAGEAL ECHOCARDIOGRAM;  Surgeon: Dorise MARLA Fellers, MD;  Location: [facility] OR;  Service: Open Heart Surgery;  Laterality: N/A;   LEFT HEART CATHETERIZATION WITH CORONARY ANGIOGRAM N/A 12/29/2013   Procedure: LEFT HEART CATHETERIZATION WITH CORONARY ANGIOGRAM;  Surgeon: Victory ORN Claudene DOUGLAS, MD;  [facility] LAB;  Service: Cardiovascular;  Laterality: N/A;   none     PERIPHERAL VASCULAR BALLOON ANGIOPLASTY  04/11/2023   Procedure: PERIPHERAL VASCULAR BALLOON ANGIOPLASTY;  Surgeon: Melia Lynwood ORN, MD;  Location: [facility] INVASIVE CV LAB;  Service: Cardiovascular;;  Inflow Juxta-Anastomosis   POLYPECTOMY  10/17/2021   Procedure: POLYPECTOMY;  Surgeon: Elicia Claw, MD;  Location: [facility] ENDOSCOPY;  Service: Gastroenterology;;    ROS: Review of Systems Negative except as stated above  PHYSICAL EXAM: BP 101/66 (BP Location: Left Arm, Patient Position: Sitting, Cuff Size: Normal)   Pulse 60   Temp 98.1 F (36.7 C) (Oral)   Ht 5' 10 (1.778 m)   Wt 218 lb (98.9 kg)   SpO2 96%   BMI 31.28 kg/m   Physical Exam   General appearance - alert, well appearing, middle-aged older African-American male and in no distress Mental status - normal mood, behavior, speech, dress, motor activity, and thought processes Chest - clear to auscultation, no wheezes, rales or rhonchi, symmetric air entry Heart - normal rate, regular rhythm, normal S1, S2, no murmurs, rubs, clicks or gallops Extremities - no LE edema     Latest Ref Rng & Units 10/17/2021    6:50 AM 03/18/2020   10:19 AM 07/06/2019   10:33 AM  CMP  Glucose 70 - 99 mg/dL 92   896   BUN 6 - 20 mg/dL 31   26   Creatinine 9.38 - 1.24 mg/dL 88.49   7.25   Sodium 864 - 145 mmol/L 137   142   Potassium 3.5 - 5.1  mmol/L 3.4  4.8  4.5   Chloride 98 - 111 mmol/L 93   106   CO2 20 - 29 mmol/L   21  Calcium  8.7 - 10.2 mg/dL   9.2   Total Protein 6.0 - 8.5 g/dL  6.9    Total Bilirubin 0.0 - 1.2 mg/dL  0.2    Alkaline Phos 44 - 121 IU/L  108    AST 0 - 40 IU/L  14    ALT 0 - 44 IU/L  24     Lipid Panel     Component Value Date/Time   CHOL 125 11/16/2021 0914   TRIG 122 11/16/2021 0914   HDL 29 (L) 11/16/2021 0914   CHOLHDL 4.3 11/16/2021 0914   CHOLHDL 4.9 04/14/2015 0943   VLDL 22 04/14/2015 0943   LDLCALC 74 11/16/2021 0914   LDLDIRECT 198 (H) 05/29/2012 1744    CBC    Component Value Date/Time   WBC 8.8 03/18/2020 1019   WBC 8.0 08/16/2014 1147   RBC 4.60 03/18/2020 1019   RBC 4.97 08/16/2014 1147   HGB 13.3 10/17/2021 0650   HGB 12.6 (L) 03/18/2020 1019   HCT 39.0 10/17/2021 0650   HCT 38.5 03/18/2020 1019   PLT 289 03/18/2020 1019   MCV 84 03/18/2020 1019   MCH 27.4 03/18/2020 1019   MCH 27.6 08/16/2014 1147   MCHC 32.7 03/18/2020 1019   MCHC 33.5 08/16/2014 1147   RDW 14.5 03/18/2020 1019   LYMPHSABS 3.5 12/31/2013 0315   MONOABS 1.1 (H) 12/31/2013 0315   EOSABS 0.3 12/31/2013 0315   BASOSABS 0.0 12/31/2013 0315    ASSESSMENT AND PLAN: 1. Coronary artery disease of native artery of heart in native heart with stable angina pectoris (HCC) (Primary) Patient to continue aspirin , Plavix , atorvastatin  80 mg daily and metoprolol .  We have decreased metoprolol  from 25 mg twice a day to half a tablet twice a day due to issues with hypotension especially during dialysis.  I informed the patient that if he has not has any increased chest pain especially chest pains at rest, he should go back to taking full 25 mg twice a day and just hold off on taking the medication on his dialysis days - metoprolol  tartrate (LOPRESSOR ) 25 MG tablet; Take 0.5 tablets (12.5 mg total) by mouth 2 (two) times daily.  Dispense: 90 tablet; Refill: 2 - clopidogrel  (PLAVIX ) 75 MG tablet; Take 1 tablet (75 mg  total) by mouth daily.  Dispense: 90 tablet; Refill: 1  2. Chronic combined systolic and diastolic CHF (congestive heart failure) (HCC) Stable, compensated with normalization of on recent echo.  Advised patient to discuss with his cardiologist on upcoming visit the issue with his heart function and reason for him being taken off of transplant. - furosemide  (LASIX ) 40 MG tablet; Take 1 tablet (40 mg total) by mouth daily.  Dispense: 90 tablet; Refill: 2  3. Essential hypertension See #1 above. - metoprolol  tartrate (LOPRESSOR ) 25 MG tablet; Take 0.5 tablets (12.5 mg total) by mouth 2 (two) times daily.  Dispense: 90 tablet; Refill: 2  4. ESRD on hemodialysis [facility]) Patient continues on dialysis 3 days a week.  Will go on transplant list.  5. Pneumococcal vaccination declined Recommended.  Patient declined  6. Influenza vaccination declined  Will have my CMA call his [facility] pharmacy to get information the hepatitis B vaccine series that he has had so that we can update his health maintenance.  Patient was given the opportunity to ask questions.  Patient verbalized understanding of the plan and was able to repeat key elements of the plan.   This documentation was completed using Paediatric nurse.  Any transcriptional errors are unintentional.  No orders of the defined types were placed in this encounter.    Requested Prescriptions   Signed Prescriptions Disp Refills   atorvastatin  (LIPITOR) 80 MG tablet 90 tablet 1    Sig: Take 1 tablet (80 mg total) by mouth daily.   furosemide  (LASIX ) 40 MG tablet 90 tablet 2    Sig: Take 1 tablet (40 mg total) by mouth daily.   metoprolol  tartrate (LOPRESSOR ) 25 MG tablet 90 tablet 2    Sig: Take 0.5 tablets (12.5 mg total) by mouth 2 (two) times daily.   clopidogrel  (PLAVIX ) 75 MG tablet 90 tablet 1    Sig: Take 1 tablet (75 mg total) by mouth daily.    Return in about 4 months (around 02/07/2024).  Barnie Louder, MD,  FACP

## 2023-10-08 NOTE — Patient Instructions (Signed)
  VISIT SUMMARY: Today, you came in for a follow-up visit to discuss your ongoing health conditions, including coronary artery disease, heart failure, end-stage renal disease, and hypertension. We reviewed your current medications and symptoms, and made some adjustments to your treatment plan to better manage your conditions.  YOUR PLAN: -CORONARY ARTERY DISEASE WITH ANGINA: Coronary artery disease is a condition where the blood vessels supplying your heart are narrowed or blocked, causing chest pain (angina). You should continue taking aspirin , atorvastatin , clopidogrel , and furosemide . We have decreased your metoprolol  dose to 12.5 mg twice daily. If you experience more chest pain, revert to 25 mg twice daily and avoid taking it on dialysis days. Follow up with your cardiologist, Dr. Pascal, on October 17, 2023.  -HEART FAILURE WITH PRESERVED EJECTION FRACTION: Heart failure with preserved ejection fraction means your heart's pumping ability is normal, but it still has trouble filling with blood. Continue with your current heart failure management regimen.  -END STAGE RENAL DISEASE ON HEMODIALYSIS: End-stage renal disease is the final stage of chronic kidney disease where your kidneys no longer function properly, requiring dialysis. Continue your dialysis schedule three times a week and monitor your blood pressure closely during sessions. We will adjust your metoprolol  dose to help manage your blood pressure during dialysis.  -HYPERTENSION: Hypertension is high blood pressure. Your blood pressure is well-controlled at home, but it drops during dialysis. Continue monitoring your blood pressure at home and we will adjust your metoprolol  dose to manage it during dialysis.  INSTRUCTIONS: Follow up with your cardiologist, Dr. Pascal, on October 17, 2023. Continue monitoring your blood pressure at home and during dialysis sessions. If you experience increased chest pain, revert your metoprolol  dose to 25 mg  twice daily and avoid taking it on dialysis days.                      Contains text generated by Abridge.                                 Contains text generated by Abridge.

## 2023-10-09 DIAGNOSIS — N186 End stage renal disease: Secondary | ICD-10-CM | POA: Diagnosis not present

## 2023-10-09 DIAGNOSIS — Z992 Dependence on renal dialysis: Secondary | ICD-10-CM | POA: Diagnosis not present

## 2023-10-09 DIAGNOSIS — N2581 Secondary hyperparathyroidism of renal origin: Secondary | ICD-10-CM | POA: Diagnosis not present

## 2023-10-10 ENCOUNTER — Telehealth: Payer: Self-pay | Admitting: Internal Medicine

## 2023-10-10 NOTE — Telephone Encounter (Signed)
-----   Message from [facility] Clarisa A sent at 10/09/2023  5:21 PM EDT ----- Regarding: RE: Hepatitis B vaccine series Called & spoke to [facility]. They said that he has not received a HEP B just shingles. ----- Message ----- From: Vicci Barnie NOVAK, MD Sent: 10/08/2023  12:43 PM EDT To: Brent LOISE Crocker, CMA Subject: Hepatitis B vaccine series                     Patient reports having had all 3 hepatitis B vaccines at [facility].  Please call them and get this information so that we can update his health maintenance.

## 2023-10-10 NOTE — Telephone Encounter (Signed)
 Please call and let pt know that we reached out to his pharmacy and was told he has not received the Hepatitis B vaccine series from them. See if he wants to do lab test to check to see if he has been immunized ie received the vaccine. If blood test show that he has not, then we can have him start the vaccine series.

## 2023-10-11 DIAGNOSIS — N186 End stage renal disease: Secondary | ICD-10-CM | POA: Diagnosis not present

## 2023-10-11 DIAGNOSIS — N2581 Secondary hyperparathyroidism of renal origin: Secondary | ICD-10-CM | POA: Diagnosis not present

## 2023-10-11 DIAGNOSIS — Z992 Dependence on renal dialysis: Secondary | ICD-10-CM | POA: Diagnosis not present

## 2023-10-11 NOTE — Telephone Encounter (Signed)
 Called & spoke to the patient. Verified name & DOB. Informed that his [facility] pharmacy verified that no Hepatitis B vaccine has been administered. Inquired if he would like to do a titer for Hep B to verify if he has been adequately immunized. Patient declined and will go to [facility] to receive his vaccine.

## 2023-10-14 DIAGNOSIS — Z992 Dependence on renal dialysis: Secondary | ICD-10-CM | POA: Diagnosis not present

## 2023-10-14 DIAGNOSIS — N186 End stage renal disease: Secondary | ICD-10-CM | POA: Diagnosis not present

## 2023-10-14 DIAGNOSIS — N2581 Secondary hyperparathyroidism of renal origin: Secondary | ICD-10-CM | POA: Diagnosis not present

## 2023-10-16 DIAGNOSIS — Z992 Dependence on renal dialysis: Secondary | ICD-10-CM | POA: Diagnosis not present

## 2023-10-16 DIAGNOSIS — N186 End stage renal disease: Secondary | ICD-10-CM | POA: Diagnosis not present

## 2023-10-16 DIAGNOSIS — N2581 Secondary hyperparathyroidism of renal origin: Secondary | ICD-10-CM | POA: Diagnosis not present

## 2023-10-18 DIAGNOSIS — Z992 Dependence on renal dialysis: Secondary | ICD-10-CM | POA: Diagnosis not present

## 2023-10-18 DIAGNOSIS — N2581 Secondary hyperparathyroidism of renal origin: Secondary | ICD-10-CM | POA: Diagnosis not present

## 2023-10-18 DIAGNOSIS — N186 End stage renal disease: Secondary | ICD-10-CM | POA: Diagnosis not present

## 2023-10-20 DIAGNOSIS — N186 End stage renal disease: Secondary | ICD-10-CM | POA: Diagnosis not present

## 2023-10-20 DIAGNOSIS — I129 Hypertensive chronic kidney disease with stage 1 through stage 4 chronic kidney disease, or unspecified chronic kidney disease: Secondary | ICD-10-CM | POA: Diagnosis not present

## 2023-10-20 DIAGNOSIS — Z992 Dependence on renal dialysis: Secondary | ICD-10-CM | POA: Diagnosis not present

## 2023-10-21 DIAGNOSIS — N2581 Secondary hyperparathyroidism of renal origin: Secondary | ICD-10-CM | POA: Diagnosis not present

## 2023-10-21 DIAGNOSIS — Z992 Dependence on renal dialysis: Secondary | ICD-10-CM | POA: Diagnosis not present

## 2023-10-21 DIAGNOSIS — N186 End stage renal disease: Secondary | ICD-10-CM | POA: Diagnosis not present

## 2023-10-23 DIAGNOSIS — N186 End stage renal disease: Secondary | ICD-10-CM | POA: Diagnosis not present

## 2023-10-23 DIAGNOSIS — N2581 Secondary hyperparathyroidism of renal origin: Secondary | ICD-10-CM | POA: Diagnosis not present

## 2023-10-23 DIAGNOSIS — Z992 Dependence on renal dialysis: Secondary | ICD-10-CM | POA: Diagnosis not present

## 2023-10-25 DIAGNOSIS — Z992 Dependence on renal dialysis: Secondary | ICD-10-CM | POA: Diagnosis not present

## 2023-10-25 DIAGNOSIS — N2581 Secondary hyperparathyroidism of renal origin: Secondary | ICD-10-CM | POA: Diagnosis not present

## 2023-10-25 DIAGNOSIS — N186 End stage renal disease: Secondary | ICD-10-CM | POA: Diagnosis not present

## 2023-10-26 DIAGNOSIS — H524 Presbyopia: Secondary | ICD-10-CM | POA: Diagnosis not present

## 2023-10-28 DIAGNOSIS — Z992 Dependence on renal dialysis: Secondary | ICD-10-CM | POA: Diagnosis not present

## 2023-10-28 DIAGNOSIS — N2581 Secondary hyperparathyroidism of renal origin: Secondary | ICD-10-CM | POA: Diagnosis not present

## 2023-10-28 DIAGNOSIS — N186 End stage renal disease: Secondary | ICD-10-CM | POA: Diagnosis not present

## 2023-10-30 DIAGNOSIS — N2581 Secondary hyperparathyroidism of renal origin: Secondary | ICD-10-CM | POA: Diagnosis not present

## 2023-10-30 DIAGNOSIS — Z992 Dependence on renal dialysis: Secondary | ICD-10-CM | POA: Diagnosis not present

## 2023-10-30 DIAGNOSIS — N186 End stage renal disease: Secondary | ICD-10-CM | POA: Diagnosis not present

## 2023-11-01 DIAGNOSIS — Z992 Dependence on renal dialysis: Secondary | ICD-10-CM | POA: Diagnosis not present

## 2023-11-01 DIAGNOSIS — N186 End stage renal disease: Secondary | ICD-10-CM | POA: Diagnosis not present

## 2023-11-01 DIAGNOSIS — N2581 Secondary hyperparathyroidism of renal origin: Secondary | ICD-10-CM | POA: Diagnosis not present

## 2023-11-04 DIAGNOSIS — Z992 Dependence on renal dialysis: Secondary | ICD-10-CM | POA: Diagnosis not present

## 2023-11-04 DIAGNOSIS — N2581 Secondary hyperparathyroidism of renal origin: Secondary | ICD-10-CM | POA: Diagnosis not present

## 2023-11-04 DIAGNOSIS — N186 End stage renal disease: Secondary | ICD-10-CM | POA: Diagnosis not present

## 2023-11-06 DIAGNOSIS — N2581 Secondary hyperparathyroidism of renal origin: Secondary | ICD-10-CM | POA: Diagnosis not present

## 2023-11-06 DIAGNOSIS — Z992 Dependence on renal dialysis: Secondary | ICD-10-CM | POA: Diagnosis not present

## 2023-11-06 DIAGNOSIS — N186 End stage renal disease: Secondary | ICD-10-CM | POA: Diagnosis not present

## 2023-11-08 DIAGNOSIS — Z992 Dependence on renal dialysis: Secondary | ICD-10-CM | POA: Diagnosis not present

## 2023-11-08 DIAGNOSIS — N2581 Secondary hyperparathyroidism of renal origin: Secondary | ICD-10-CM | POA: Diagnosis not present

## 2023-11-08 DIAGNOSIS — N186 End stage renal disease: Secondary | ICD-10-CM | POA: Diagnosis not present

## 2023-11-11 DIAGNOSIS — N2581 Secondary hyperparathyroidism of renal origin: Secondary | ICD-10-CM | POA: Diagnosis not present

## 2023-11-11 DIAGNOSIS — N186 End stage renal disease: Secondary | ICD-10-CM | POA: Diagnosis not present

## 2023-11-11 DIAGNOSIS — Z992 Dependence on renal dialysis: Secondary | ICD-10-CM | POA: Diagnosis not present

## 2023-11-13 DIAGNOSIS — N2581 Secondary hyperparathyroidism of renal origin: Secondary | ICD-10-CM | POA: Diagnosis not present

## 2023-11-13 DIAGNOSIS — Z992 Dependence on renal dialysis: Secondary | ICD-10-CM | POA: Diagnosis not present

## 2023-11-13 DIAGNOSIS — N186 End stage renal disease: Secondary | ICD-10-CM | POA: Diagnosis not present

## 2023-11-15 DIAGNOSIS — Z992 Dependence on renal dialysis: Secondary | ICD-10-CM | POA: Diagnosis not present

## 2023-11-15 DIAGNOSIS — N186 End stage renal disease: Secondary | ICD-10-CM | POA: Diagnosis not present

## 2023-11-15 DIAGNOSIS — N2581 Secondary hyperparathyroidism of renal origin: Secondary | ICD-10-CM | POA: Diagnosis not present

## 2023-11-17 ENCOUNTER — Other Ambulatory Visit: Payer: Self-pay | Admitting: Internal Medicine

## 2023-11-17 DIAGNOSIS — I25708 Atherosclerosis of coronary artery bypass graft(s), unspecified, with other forms of angina pectoris: Secondary | ICD-10-CM

## 2023-11-18 DIAGNOSIS — N2581 Secondary hyperparathyroidism of renal origin: Secondary | ICD-10-CM | POA: Diagnosis not present

## 2023-11-18 DIAGNOSIS — Z992 Dependence on renal dialysis: Secondary | ICD-10-CM | POA: Diagnosis not present

## 2023-11-18 DIAGNOSIS — N186 End stage renal disease: Secondary | ICD-10-CM | POA: Diagnosis not present

## 2023-11-19 DIAGNOSIS — N186 End stage renal disease: Secondary | ICD-10-CM | POA: Diagnosis not present

## 2023-11-19 DIAGNOSIS — I129 Hypertensive chronic kidney disease with stage 1 through stage 4 chronic kidney disease, or unspecified chronic kidney disease: Secondary | ICD-10-CM | POA: Diagnosis not present

## 2023-11-19 DIAGNOSIS — Z992 Dependence on renal dialysis: Secondary | ICD-10-CM | POA: Diagnosis not present

## 2023-11-20 DIAGNOSIS — N2581 Secondary hyperparathyroidism of renal origin: Secondary | ICD-10-CM | POA: Diagnosis not present

## 2023-11-20 DIAGNOSIS — N186 End stage renal disease: Secondary | ICD-10-CM | POA: Diagnosis not present

## 2023-11-20 DIAGNOSIS — Z992 Dependence on renal dialysis: Secondary | ICD-10-CM | POA: Diagnosis not present

## 2023-11-22 DIAGNOSIS — N2581 Secondary hyperparathyroidism of renal origin: Secondary | ICD-10-CM | POA: Diagnosis not present

## 2023-11-22 DIAGNOSIS — Z992 Dependence on renal dialysis: Secondary | ICD-10-CM | POA: Diagnosis not present

## 2023-11-27 DIAGNOSIS — Z992 Dependence on renal dialysis: Secondary | ICD-10-CM | POA: Diagnosis not present

## 2023-11-27 DIAGNOSIS — N2581 Secondary hyperparathyroidism of renal origin: Secondary | ICD-10-CM | POA: Diagnosis not present

## 2023-11-29 DIAGNOSIS — N2581 Secondary hyperparathyroidism of renal origin: Secondary | ICD-10-CM | POA: Diagnosis not present

## 2023-12-04 DIAGNOSIS — N186 End stage renal disease: Secondary | ICD-10-CM | POA: Diagnosis not present

## 2023-12-06 DIAGNOSIS — Z992 Dependence on renal dialysis: Secondary | ICD-10-CM | POA: Diagnosis not present

## 2023-12-06 DIAGNOSIS — N2581 Secondary hyperparathyroidism of renal origin: Secondary | ICD-10-CM | POA: Diagnosis not present

## 2023-12-06 DIAGNOSIS — N186 End stage renal disease: Secondary | ICD-10-CM | POA: Diagnosis not present

## 2023-12-10 DIAGNOSIS — I509 Heart failure, unspecified: Secondary | ICD-10-CM | POA: Diagnosis not present

## 2023-12-10 DIAGNOSIS — I132 Hypertensive heart and chronic kidney disease with heart failure and with stage 5 chronic kidney disease, or end stage renal disease: Secondary | ICD-10-CM | POA: Diagnosis not present

## 2023-12-10 DIAGNOSIS — E669 Obesity, unspecified: Secondary | ICD-10-CM | POA: Diagnosis not present

## 2023-12-10 DIAGNOSIS — I4891 Unspecified atrial fibrillation: Secondary | ICD-10-CM | POA: Diagnosis not present

## 2023-12-10 DIAGNOSIS — Z992 Dependence on renal dialysis: Secondary | ICD-10-CM | POA: Diagnosis not present

## 2023-12-10 DIAGNOSIS — D6869 Other thrombophilia: Secondary | ICD-10-CM | POA: Diagnosis not present

## 2023-12-10 DIAGNOSIS — Z7902 Long term (current) use of antithrombotics/antiplatelets: Secondary | ICD-10-CM | POA: Diagnosis not present

## 2023-12-10 DIAGNOSIS — I255 Ischemic cardiomyopathy: Secondary | ICD-10-CM | POA: Diagnosis not present

## 2023-12-10 DIAGNOSIS — E785 Hyperlipidemia, unspecified: Secondary | ICD-10-CM | POA: Diagnosis not present

## 2023-12-10 DIAGNOSIS — Z6832 Body mass index (BMI) 32.0-32.9, adult: Secondary | ICD-10-CM | POA: Diagnosis not present

## 2023-12-10 DIAGNOSIS — I25119 Atherosclerotic heart disease of native coronary artery with unspecified angina pectoris: Secondary | ICD-10-CM | POA: Diagnosis not present

## 2023-12-10 DIAGNOSIS — N2581 Secondary hyperparathyroidism of renal origin: Secondary | ICD-10-CM | POA: Diagnosis not present

## 2023-12-10 DIAGNOSIS — N186 End stage renal disease: Secondary | ICD-10-CM | POA: Diagnosis not present

## 2023-12-10 DIAGNOSIS — I871 Compression of vein: Secondary | ICD-10-CM | POA: Diagnosis not present

## 2023-12-10 DIAGNOSIS — N250 Renal osteodystrophy: Secondary | ICD-10-CM | POA: Diagnosis not present

## 2023-12-10 DIAGNOSIS — Z7982 Long term (current) use of aspirin: Secondary | ICD-10-CM | POA: Diagnosis not present

## 2023-12-11 DIAGNOSIS — N186 End stage renal disease: Secondary | ICD-10-CM | POA: Diagnosis not present

## 2023-12-13 DIAGNOSIS — N186 End stage renal disease: Secondary | ICD-10-CM | POA: Diagnosis not present

## 2023-12-13 DIAGNOSIS — N2581 Secondary hyperparathyroidism of renal origin: Secondary | ICD-10-CM | POA: Diagnosis not present

## 2023-12-13 DIAGNOSIS — Z992 Dependence on renal dialysis: Secondary | ICD-10-CM | POA: Diagnosis not present

## 2023-12-20 DIAGNOSIS — I129 Hypertensive chronic kidney disease with stage 1 through stage 4 chronic kidney disease, or unspecified chronic kidney disease: Secondary | ICD-10-CM | POA: Diagnosis not present

## 2023-12-20 DIAGNOSIS — Z992 Dependence on renal dialysis: Secondary | ICD-10-CM | POA: Diagnosis not present

## 2023-12-20 DIAGNOSIS — N186 End stage renal disease: Secondary | ICD-10-CM | POA: Diagnosis not present

## 2023-12-20 DIAGNOSIS — N2581 Secondary hyperparathyroidism of renal origin: Secondary | ICD-10-CM | POA: Diagnosis not present

## 2023-12-23 DIAGNOSIS — N2581 Secondary hyperparathyroidism of renal origin: Secondary | ICD-10-CM | POA: Diagnosis not present

## 2023-12-23 DIAGNOSIS — Z992 Dependence on renal dialysis: Secondary | ICD-10-CM | POA: Diagnosis not present

## 2023-12-25 DIAGNOSIS — Z992 Dependence on renal dialysis: Secondary | ICD-10-CM | POA: Diagnosis not present

## 2023-12-25 DIAGNOSIS — N186 End stage renal disease: Secondary | ICD-10-CM | POA: Diagnosis not present

## 2023-12-25 DIAGNOSIS — N2581 Secondary hyperparathyroidism of renal origin: Secondary | ICD-10-CM | POA: Diagnosis not present

## 2023-12-30 DIAGNOSIS — N186 End stage renal disease: Secondary | ICD-10-CM | POA: Diagnosis not present

## 2023-12-30 DIAGNOSIS — Z992 Dependence on renal dialysis: Secondary | ICD-10-CM | POA: Diagnosis not present

## 2023-12-30 DIAGNOSIS — N2581 Secondary hyperparathyroidism of renal origin: Secondary | ICD-10-CM | POA: Diagnosis not present

## 2024-01-01 DIAGNOSIS — N2581 Secondary hyperparathyroidism of renal origin: Secondary | ICD-10-CM | POA: Diagnosis not present

## 2024-01-01 DIAGNOSIS — N186 End stage renal disease: Secondary | ICD-10-CM | POA: Diagnosis not present

## 2024-01-01 DIAGNOSIS — Z992 Dependence on renal dialysis: Secondary | ICD-10-CM | POA: Diagnosis not present

## 2024-01-03 DIAGNOSIS — N186 End stage renal disease: Secondary | ICD-10-CM | POA: Diagnosis not present

## 2024-01-08 DIAGNOSIS — N2581 Secondary hyperparathyroidism of renal origin: Secondary | ICD-10-CM | POA: Diagnosis not present

## 2024-01-08 DIAGNOSIS — Z992 Dependence on renal dialysis: Secondary | ICD-10-CM | POA: Diagnosis not present

## 2024-01-10 DIAGNOSIS — Z992 Dependence on renal dialysis: Secondary | ICD-10-CM | POA: Diagnosis not present

## 2024-01-10 DIAGNOSIS — N2581 Secondary hyperparathyroidism of renal origin: Secondary | ICD-10-CM | POA: Diagnosis not present

## 2024-01-10 DIAGNOSIS — N186 End stage renal disease: Secondary | ICD-10-CM | POA: Diagnosis not present

## 2024-01-13 ENCOUNTER — Other Ambulatory Visit: Payer: Self-pay | Admitting: Internal Medicine

## 2024-01-13 DIAGNOSIS — I25708 Atherosclerosis of coronary artery bypass graft(s), unspecified, with other forms of angina pectoris: Secondary | ICD-10-CM

## 2024-01-15 DIAGNOSIS — Z992 Dependence on renal dialysis: Secondary | ICD-10-CM | POA: Diagnosis not present

## 2024-01-15 DIAGNOSIS — N186 End stage renal disease: Secondary | ICD-10-CM | POA: Diagnosis not present

## 2024-01-19 DIAGNOSIS — N186 End stage renal disease: Secondary | ICD-10-CM | POA: Diagnosis not present

## 2024-01-19 DIAGNOSIS — I129 Hypertensive chronic kidney disease with stage 1 through stage 4 chronic kidney disease, or unspecified chronic kidney disease: Secondary | ICD-10-CM | POA: Diagnosis not present

## 2024-01-19 DIAGNOSIS — Z992 Dependence on renal dialysis: Secondary | ICD-10-CM | POA: Diagnosis not present

## 2024-02-18 ENCOUNTER — Ambulatory Visit: Admitting: Internal Medicine

## 2024-03-19 ENCOUNTER — Ambulatory Visit: Admitting: Physician Assistant

## 2024-03-23 ENCOUNTER — Telehealth: Payer: Self-pay | Admitting: Internal Medicine

## 2024-03-23 NOTE — Telephone Encounter (Signed)
 Contacted pt resch appt!

## 2024-03-24 ENCOUNTER — Other Ambulatory Visit: Payer: Self-pay | Admitting: Internal Medicine

## 2024-03-24 ENCOUNTER — Ambulatory Visit: Admitting: Internal Medicine

## 2024-03-24 DIAGNOSIS — I25118 Atherosclerotic heart disease of native coronary artery with other forms of angina pectoris: Secondary | ICD-10-CM

## 2024-03-24 DIAGNOSIS — I5042 Chronic combined systolic (congestive) and diastolic (congestive) heart failure: Secondary | ICD-10-CM

## 2024-03-24 DIAGNOSIS — I10 Essential (primary) hypertension: Secondary | ICD-10-CM

## 2024-03-24 MED ORDER — CLOPIDOGREL BISULFATE 75 MG PO TABS: 75.0000 mg | ORAL_TABLET | Freq: Every day | ORAL | 2 refills | Status: AC

## 2024-03-24 MED ORDER — FUROSEMIDE 40 MG PO TABS: 40.0000 mg | ORAL_TABLET | Freq: Every day | ORAL | 2 refills | Status: AC

## 2024-03-24 MED ORDER — METOPROLOL TARTRATE 25 MG PO TABS: 12.5000 mg | ORAL_TABLET | Freq: Two times a day (BID) | ORAL | 2 refills | Status: AC

## 2024-03-24 MED ORDER — ATORVASTATIN CALCIUM 80 MG PO TABS: 80.0000 mg | ORAL_TABLET | Freq: Every day | ORAL | 2 refills | Status: AC

## 2024-03-24 NOTE — Telephone Encounter (Signed)
 Rfs sent to [facility] on [address].

## 2024-04-07 ENCOUNTER — Ambulatory Visit: Payer: Medicare HMO

## 2024-04-09 ENCOUNTER — Ambulatory Visit: Admitting: Internal Medicine

## 2024-05-26 ENCOUNTER — Ambulatory Visit: Admitting: Physician Assistant
